# Patient Record
Sex: Female | Born: 1940
Health system: Southern US, Community
[De-identification: ages and names within clinical notes are randomized; demographics above are authoritative.]

## PROBLEM LIST (undated history)

## (undated) DIAGNOSIS — M1990 Unspecified osteoarthritis, unspecified site: Secondary | ICD-10-CM

## (undated) DIAGNOSIS — I10 Essential (primary) hypertension: Secondary | ICD-10-CM

## (undated) DIAGNOSIS — Z87442 Personal history of urinary calculi: Secondary | ICD-10-CM

## (undated) DIAGNOSIS — C50919 Malignant neoplasm of unspecified site of unspecified female breast: Secondary | ICD-10-CM

## (undated) DIAGNOSIS — F419 Anxiety disorder, unspecified: Secondary | ICD-10-CM

## (undated) DIAGNOSIS — C189 Malignant neoplasm of colon, unspecified: Secondary | ICD-10-CM

## (undated) DIAGNOSIS — M810 Age-related osteoporosis without current pathological fracture: Secondary | ICD-10-CM

## (undated) DIAGNOSIS — K219 Gastro-esophageal reflux disease without esophagitis: Secondary | ICD-10-CM

## (undated) DIAGNOSIS — N189 Chronic kidney disease, unspecified: Secondary | ICD-10-CM

## (undated) DIAGNOSIS — T7840XA Allergy, unspecified, initial encounter: Secondary | ICD-10-CM

## (undated) DIAGNOSIS — K648 Other hemorrhoids: Secondary | ICD-10-CM

## (undated) DIAGNOSIS — D649 Anemia, unspecified: Secondary | ICD-10-CM

## (undated) DIAGNOSIS — F329 Major depressive disorder, single episode, unspecified: Secondary | ICD-10-CM

## (undated) DIAGNOSIS — Z9109 Other allergy status, other than to drugs and biological substances: Secondary | ICD-10-CM

## (undated) DIAGNOSIS — F32A Depression, unspecified: Secondary | ICD-10-CM

## (undated) DIAGNOSIS — E669 Obesity, unspecified: Secondary | ICD-10-CM

## (undated) DIAGNOSIS — Z9289 Personal history of other medical treatment: Secondary | ICD-10-CM

## (undated) DIAGNOSIS — R42 Dizziness and giddiness: Secondary | ICD-10-CM

## (undated) DIAGNOSIS — D509 Iron deficiency anemia, unspecified: Secondary | ICD-10-CM

## (undated) DIAGNOSIS — H269 Unspecified cataract: Secondary | ICD-10-CM

## (undated) HISTORY — DX: Obesity, unspecified: E66.9

## (undated) HISTORY — DX: Essential (primary) hypertension: I10

## (undated) HISTORY — DX: Age-related osteoporosis without current pathological fracture: M81.0

## (undated) HISTORY — DX: Major depressive disorder, single episode, unspecified: F32.9

## (undated) HISTORY — PX: LITHOTRIPSY: SUR834

## (undated) HISTORY — PX: ORIF TIBIA & FIBULA FRACTURES: SHX2131

## (undated) HISTORY — DX: Other hemorrhoids: K64.8

## (undated) HISTORY — DX: Anxiety disorder, unspecified: F41.9

## (undated) HISTORY — PX: BREAST SURGERY: SHX581

## (undated) HISTORY — PX: CHOLECYSTECTOMY: SHX55

## (undated) HISTORY — DX: Malignant neoplasm of colon, unspecified: C18.9

## (undated) HISTORY — DX: Iron deficiency anemia, unspecified: D50.9

## (undated) HISTORY — DX: Chronic kidney disease, unspecified: N18.9

## (undated) HISTORY — PX: POLYPECTOMY: SHX149

## (undated) HISTORY — DX: Unspecified cataract: H26.9

## (undated) HISTORY — DX: Depression, unspecified: F32.A

## (undated) HISTORY — PX: TONSILLECTOMY AND ADENOIDECTOMY: SUR1326

## (undated) HISTORY — DX: Unspecified osteoarthritis, unspecified site: M19.90

## (undated) HISTORY — DX: Allergy, unspecified, initial encounter: T78.40XA

## (undated) HISTORY — PX: ORIF ULNAR / RADIAL SHAFT FRACTURE: SUR966

## (undated) HISTORY — DX: Anemia, unspecified: D64.9

## (undated) HISTORY — PX: COLONOSCOPY: SHX174

## (undated) HISTORY — PX: VAGINAL HYSTERECTOMY: SUR661

---

## 1997-11-30 ENCOUNTER — Ambulatory Visit (HOSPITAL_COMMUNITY): Admission: RE | Admit: 1997-11-30 | Discharge: 1997-11-30 | Payer: Self-pay | Admitting: Orthopedic Surgery

## 1998-08-12 ENCOUNTER — Other Ambulatory Visit: Admission: RE | Admit: 1998-08-12 | Discharge: 1998-08-12 | Payer: Self-pay | Admitting: Obstetrics and Gynecology

## 1999-12-08 ENCOUNTER — Other Ambulatory Visit: Admission: RE | Admit: 1999-12-08 | Discharge: 1999-12-08 | Payer: Self-pay | Admitting: Obstetrics and Gynecology

## 1999-12-08 ENCOUNTER — Encounter (INDEPENDENT_AMBULATORY_CARE_PROVIDER_SITE_OTHER): Payer: Self-pay

## 2004-02-17 ENCOUNTER — Ambulatory Visit: Payer: Self-pay | Admitting: Internal Medicine

## 2004-03-04 ENCOUNTER — Ambulatory Visit: Payer: Self-pay | Admitting: Internal Medicine

## 2007-02-25 ENCOUNTER — Ambulatory Visit: Payer: Self-pay | Admitting: Internal Medicine

## 2007-03-11 ENCOUNTER — Encounter: Payer: Self-pay | Admitting: Internal Medicine

## 2007-03-11 ENCOUNTER — Ambulatory Visit: Payer: Self-pay | Admitting: Internal Medicine

## 2007-03-11 LAB — CONVERTED CEMR LAB: BUN: 12 mg/dL (ref 6–23)

## 2007-03-21 ENCOUNTER — Ambulatory Visit: Payer: Self-pay | Admitting: Cardiology

## 2008-02-29 ENCOUNTER — Inpatient Hospital Stay (HOSPITAL_COMMUNITY): Admission: RE | Admit: 2008-02-29 | Discharge: 2008-03-01 | Payer: Self-pay | Admitting: Urology

## 2008-02-29 ENCOUNTER — Other Ambulatory Visit: Payer: Self-pay | Admitting: Urology

## 2008-02-29 ENCOUNTER — Encounter: Payer: Self-pay | Admitting: Emergency Medicine

## 2008-03-01 ENCOUNTER — Emergency Department (HOSPITAL_COMMUNITY): Admission: EM | Admit: 2008-03-01 | Discharge: 2008-03-02 | Payer: Self-pay | Admitting: Emergency Medicine

## 2008-03-09 ENCOUNTER — Ambulatory Visit (HOSPITAL_COMMUNITY): Admission: RE | Admit: 2008-03-09 | Discharge: 2008-03-09 | Payer: Self-pay | Admitting: Urology

## 2008-04-17 HISTORY — PX: OTHER SURGICAL HISTORY: SHX169

## 2008-05-18 ENCOUNTER — Ambulatory Visit (HOSPITAL_COMMUNITY): Admission: RE | Admit: 2008-05-18 | Discharge: 2008-05-20 | Payer: Self-pay | Admitting: Urology

## 2008-06-05 ENCOUNTER — Ambulatory Visit (HOSPITAL_COMMUNITY): Admission: RE | Admit: 2008-06-05 | Discharge: 2008-06-05 | Payer: Self-pay | Admitting: Family Medicine

## 2009-03-30 ENCOUNTER — Encounter: Admission: RE | Admit: 2009-03-30 | Discharge: 2009-03-30 | Payer: Self-pay | Admitting: Family Medicine

## 2010-05-09 ENCOUNTER — Encounter: Payer: Self-pay | Admitting: Family Medicine

## 2010-08-01 LAB — BASIC METABOLIC PANEL
CO2: 31 mEq/L (ref 19–32)
Calcium: 9.2 mg/dL (ref 8.4–10.5)
Creatinine, Ser: 0.76 mg/dL (ref 0.4–1.2)
GFR calc Af Amer: >60 mL/min (ref >60)
GFR calc non Af Amer: >60 mL/min (ref >60)
Sodium: 142 mEq/L (ref 135–145)

## 2010-08-01 LAB — CBC
MCHC: 33.4 g/dL (ref 30.0–36.0)
MCV: 87.9 fL (ref 78.0–100.0)
Platelets: 241 10*3/uL (ref 150–400)
RBC: 4.91 MIL/uL (ref 3.87–5.11)
RDW: 13.1 % (ref 11.5–15.5)

## 2010-08-01 LAB — TYPE AND SCREEN
ABO/RH(D): O POS
Antibody Screen: NEGATIVE

## 2010-08-30 NOTE — Op Note (Signed)
Ann Maxwell, Ann Maxwell               ACCOUNT NO.:  [id number]   MEDICAL RECORD NO.:  [id number]          PATIENT TYPE:  OIB   LOCATION:  1429                         FACILITY:  [facility]   PHYSICIAN:  Bertram Millard. Dahlstedt, M.D.DATE OF BIRTH:  1940/09/16   DATE OF PROCEDURE:  05/18/2008  DATE OF DISCHARGE:                               OPERATIVE REPORT   PREOPERATIVE DIAGNOSIS:  Right renal calculi.   POSTOPERATIVE DIAGNOSIS:  Right renal calculi.   PRINCIPAL PROCEDURE:  Percutaneous nephrolithotomy of right renal  calculi, aggregate diameter greater than 2 cm.   SURGEON:  Dr. Retta Diones.   RADIOLOGISTSherrine Maples T. Fredia Sorrow, M.D.   COMPLICATIONS:  None.   ANESTHESIA:  General endotracheal.   SPECIMENS:  Six stones, to patient's friend.   DRAINS:  A 20-French Council tipped catheter for right percutaneous  nephrostomy tube.   BRIEF HISTORY:  A 70 year old female with prior lithotripsy of the upper  ureteral/renal calculi by Dr. Vonita Moss.  She had incomplete  fragmentation and non-passage of these stones.  Due to the extreme  durability of these stones, and their persistent renal location, it was  recommended that she undergo percutaneous nephrolithotomy for definitive  removal.  She saw me in consultation recently, and was scheduled for  that procedure.  The risks and complications have been discussed with  the patient.  She understands these and desires to proceed.   DESCRIPTION OF PROCEDURE:  The patient received percutaneous nephrostomy  tube placement by Dr. Fredia Sorrow, dictated separately, in the radiology  department.  She was taken to the holding room where the surgical side  was marked and she was identified.  She had already received Cipro  intravenously in the radiology department.  She was taken to the  operating room where a general anesthetic was administered with  endotracheal apparatus.  She was then placed in the prone position, all  extremities padded appropriately.   The right flank was prepped and  draped.  A timeout was then called.  The proper site, position,  procedure, physician, antibiotics and allergies were discussed.  At this  point, Dr. Fredia Sorrow used the previously placed nephrostomy access to  place a 28-French access sheath.  This was done using fluoroscopic  guidance.  Following this, I placed the rigid nephroscope.  I  immediately encountered approximately 4 fairly large renal calculi.  These were extracted.  One or two of the fragments were seen within the  upper ureter.  These were also extracted.  Using the flexible scope, I  could not detect any further stones, and using fluoroscopy there was  seemingly one calcification in the area of the tip of the sheath.  I  felt, after significant exploration with both the rigid and flexible  scope, that this was most likely intraparenchymal.  For approximately 30-  45 minutes, a search for that one stone.  Since the stone could not be  visualized, I then removed the access sheath after a 20-French Council  tip catheter had been placed in the renal pelvis, with approximately 3  mL of contrast placed in the balloon.  Nephrostogram revealed  adequate  positioning of the tip of the catheter.  The catheter was then sutured  to the skin with 2-0 silk.   At this point, the procedure was terminated.  A dry sterile dressing was  placed.  The safety wire was also removed.  The patient was awakened and  then taken to PACU in stable condition.      Bertram Millard. Dahlstedt, M.D.  Electronically Signed     SMD/MEDQ  D:  05/18/2008  T:  05/18/2008  Job:  [id number]

## 2010-08-30 NOTE — Op Note (Signed)
NAMEVONYA, Ann Maxwell               ACCOUNT NO.:  [id number]   MEDICAL RECORD NO.:  [id number]          PATIENT TYPE:  EMS   LOCATION:  MAJO                         FACILITY:  [facility]   PHYSICIAN:  Maretta Bees. Vonita Moss, M.D.DATE OF BIRTH:  12-Aug-1940   DATE OF PROCEDURE:  02/29/2008  DATE OF DISCHARGE:                               OPERATIVE REPORT   PREOPERATIVE DIAGNOSIS:  Right upper ureteral calculus with obstruction  and fever.   POSTOPERATIVE DIAGNOSIS:  Right upper ureteral calculus with obstruction  and fever.   PROCEDURE:  Cystoscopy, right retrograde pyelogram with interpretation  and right double-J catheter placement.   SURGEON:  Dr. Larey Dresser   ANESTHESIA:  General.   INDICATIONS:  This lady presented today with severe right flank pain and  fever and was felt to have an obstructed right upper ureteral calculus  measuring 5 mm in size in addition to nonobstructing right renal  calculi.  In view pyuria and fever, she was cultured and put on Rocephin  and Cipro and brought the operating room.   PROCEDURE:  The patient brought to the operating room, placed lithotomy  position.  External genitalia were prepped and draped in usual fashion.  She was cystoscoped and the bladder was unremarkable except for findings  consistent with a cystocele.  I then passed a sensor guidewire up to the  right renal collecting system and over that inserted an open-ended  ureteral catheter and performed a collective urine for culture.  Then  using the open-ended ureteral catheter cystoscope, I injected contrast  and she had mild pyelocaliectasis.   Having measured the ureteral length at 24 cm, I inserted a 5-French 24  centimeter Polaris double-J catheter and confirmed good full coil in the  renal pelvis with the loops coming out the distal right ureter and in  good position.  The string was removed from the stent before I had  placed it in position.  At this point the bladder was emptied,  the scope  removed and the patient sent to recovery room in good condition having  tolerated procedure well.      Maretta Bees. Vonita Moss, M.D.  Electronically Signed     LJP/MEDQ  D:  02/29/2008  T:  02/29/2008  Job:  [id number]

## 2010-08-30 NOTE — Consult Note (Signed)
Ann Maxwell, Ann Maxwell               ACCOUNT NO.:  [id number]   MEDICAL RECORD NO.:  [id number]          PATIENT TYPE:  EMS   LOCATION:  MAJO                         FACILITY:  [facility]   PHYSICIAN:  Maretta Bees. 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 Denton Lank at the  [facility] emergency room today after she presented this morning with severe  right flank pain with a 5 mm stone obstructing her right renal  collecting system and also nonobstructing right renal calculi.  She has  never had stones before.  There is a family history of stones.  She had  severe pain and it finally came under reasonable control but then she  developed some chills and fever and in the course of that had a urine  culture and was given a gram of Rocephin IV and 400 mg of Cipro IV and I  was asked to see this lady.  She has had no significant history of UTIs  before.   PAST MEDICAL HISTORY:  She has a history of asthma and hypertension.  Previous surgeries have included, cholecystectomy, hysterectomy, T and  A, bilateral salpingectomy, internal fixation left ankle.   ADMISSION MEDICATIONS:  1. Include Hyzaar 50/12.5 once a day.  2. Singulair 10 mg once a day.  3. Hydrochlorothiazide 12.5 mg once a day.  4. Advair Diskus 250/50 once a day.  5. Tylenol and ibuprofen p.r.n.   SOCIAL HISTORY:  She does not smoke or drink alcohol.   REVIEW OF SYSTEMS:  She denies any acute shortness of breath, cough or  chest pain.  She has had no diarrhea or constipation.   PHYSICAL EXAMINATION:  VITAL SIGNS:  On examination on admission her  temperature was 97.2 but it rose to 100.1.  Pulse ranged from 80 to 112  and her blood pressure was 141/84.  GENERAL:  She was alert and oriented and in some distress.  No  respiratory distress.  HEART:  Tones regular.  ABDOMEN:  Slight guarding in  the right flank.  The rest of the abdomen  was soft and nontender.  No peripheral edema.   IMPRESSION:  1. Obstructing right upper ureteral stone.  2. Right renal calculi.  3. Rule out urinary tract infection.   PLAN:  With a fever and obstructed kidney I felt it best that she be  taken to the operating room for cysto and right retrograde pyelogram and  right double-J catheter placement and she was agreeable to this.  I will  keep her at least for an extended recovery.      Maretta Bees. Vonita Moss, M.D.  Electronically Signed     LJP/MEDQ  D:  02/29/2008  T:  02/29/2008  Job:  [id number]

## 2011-01-18 LAB — DIFFERENTIAL
Basophils Absolute: 0
Basophils Relative: 0
Eosinophils Absolute: 0.1
Monocytes Absolute: 0.1
Monocytes Relative: 1 — ABNORMAL LOW
Neutrophils Relative %: 92 — ABNORMAL HIGH

## 2011-01-18 LAB — URINALYSIS, ROUTINE W REFLEX MICROSCOPIC
Bilirubin Urine: NEGATIVE
Bilirubin Urine: NEGATIVE
Glucose, UA: NEGATIVE
Glucose, UA: NEGATIVE
Ketones, ur: 15 — AB
Ketones, ur: NEGATIVE
Protein, ur: 100 — AB
Protein, ur: NEGATIVE
Urobilinogen, UA: 0.2
pH: 5

## 2011-01-18 LAB — CBC
HCT: 35.1 — ABNORMAL LOW
Hemoglobin: 11.7 — ABNORMAL LOW
Hemoglobin: 14.4
MCHC: 33.3
MCHC: 33.4
MCV: 88.1
MCV: 88.7
RBC: 3.96
RBC: 4.91
RDW: 13
WBC: 15.2 — ABNORMAL HIGH

## 2011-01-18 LAB — BASIC METABOLIC PANEL
CO2: 27
Calcium: 9.1
Chloride: 107
Creatinine, Ser: 0.91
GFR calc Af Amer: >60
Glucose, Bld: 100 — ABNORMAL HIGH

## 2011-01-18 LAB — URINE CULTURE: Colony Count: >=100000

## 2011-01-18 LAB — URINE MICROSCOPIC-ADD ON

## 2011-04-18 DIAGNOSIS — C50919 Malignant neoplasm of unspecified site of unspecified female breast: Secondary | ICD-10-CM

## 2011-04-18 HISTORY — DX: Malignant neoplasm of unspecified site of unspecified female breast: C50.919

## 2011-09-22 ENCOUNTER — Encounter: Payer: Self-pay | Admitting: *Deleted

## 2011-09-26 ENCOUNTER — Encounter: Payer: Self-pay | Admitting: *Deleted

## 2011-12-01 ENCOUNTER — Other Ambulatory Visit: Payer: Self-pay | Admitting: Family Medicine

## 2011-12-01 DIAGNOSIS — R921 Mammographic calcification found on diagnostic imaging of breast: Secondary | ICD-10-CM

## 2011-12-08 ENCOUNTER — Other Ambulatory Visit: Payer: Self-pay | Admitting: Family Medicine

## 2011-12-08 ENCOUNTER — Ambulatory Visit
Admission: RE | Admit: 2011-12-08 | Discharge: 2011-12-08 | Disposition: A | Discharge status: Home / Self Care | Payer: Medicare Other | Source: Ambulatory Visit | Attending: Family Medicine | Admitting: Family Medicine

## 2011-12-08 DIAGNOSIS — R921 Mammographic calcification found on diagnostic imaging of breast: Secondary | ICD-10-CM

## 2011-12-11 ENCOUNTER — Ambulatory Visit
Admission: RE | Admit: 2011-12-11 | Discharge: 2011-12-11 | Disposition: A | Discharge status: Home / Self Care | Payer: Medicare Other | Source: Ambulatory Visit | Attending: Family Medicine | Admitting: Family Medicine

## 2011-12-11 ENCOUNTER — Other Ambulatory Visit: Payer: Self-pay | Admitting: Family Medicine

## 2011-12-11 DIAGNOSIS — N6489 Other specified disorders of breast: Secondary | ICD-10-CM

## 2011-12-11 DIAGNOSIS — C50912 Malignant neoplasm of unspecified site of left female breast: Secondary | ICD-10-CM

## 2011-12-11 DIAGNOSIS — R921 Mammographic calcification found on diagnostic imaging of breast: Secondary | ICD-10-CM

## 2011-12-12 ENCOUNTER — Other Ambulatory Visit: Payer: Self-pay | Admitting: *Deleted

## 2011-12-12 ENCOUNTER — Telehealth: Payer: Self-pay | Admitting: *Deleted

## 2011-12-12 DIAGNOSIS — C50512 Malignant neoplasm of lower-outer quadrant of left female breast: Secondary | ICD-10-CM | POA: Insufficient documentation

## 2011-12-12 DIAGNOSIS — C50519 Malignant neoplasm of lower-outer quadrant of unspecified female breast: Secondary | ICD-10-CM

## 2011-12-12 NOTE — Telephone Encounter (Signed)
Confirmed [facility] for 12/20/11 at 0800 .  Instructions and contact information given.  

## 2011-12-15 ENCOUNTER — Ambulatory Visit
Admission: RE | Admit: 2011-12-15 | Discharge: 2011-12-15 | Disposition: A | Discharge status: Home / Self Care | Payer: Medicare Other | Source: Ambulatory Visit | Attending: Family Medicine | Admitting: Family Medicine

## 2011-12-15 DIAGNOSIS — C50912 Malignant neoplasm of unspecified site of left female breast: Secondary | ICD-10-CM

## 2011-12-15 MED ORDER — GADOBENATE DIMEGLUMINE 529 MG/ML IV SOLN
20.0000 mL | Freq: Once | INTRAVENOUS | Status: AC | PRN
  Administered 2011-12-15: 20 mL via INTRAVENOUS

## 2011-12-20 ENCOUNTER — Encounter: Payer: Self-pay | Admitting: Oncology

## 2011-12-20 ENCOUNTER — Ambulatory Visit (HOSPITAL_BASED_OUTPATIENT_CLINIC_OR_DEPARTMENT_OTHER): Payer: Medicare Other | Admitting: General Surgery

## 2011-12-20 ENCOUNTER — Ambulatory Visit: Payer: Medicare Other | Attending: General Surgery | Admitting: Physical Therapy

## 2011-12-20 ENCOUNTER — Encounter: Payer: Self-pay | Admitting: *Deleted

## 2011-12-20 ENCOUNTER — Ambulatory Visit: Payer: Medicare Other

## 2011-12-20 ENCOUNTER — Telehealth: Payer: Self-pay | Admitting: Oncology

## 2011-12-20 ENCOUNTER — Ambulatory Visit (HOSPITAL_BASED_OUTPATIENT_CLINIC_OR_DEPARTMENT_OTHER): Payer: Medicare Other | Admitting: Oncology

## 2011-12-20 ENCOUNTER — Ambulatory Visit
Admission: RE | Admit: 2011-12-20 | Discharge: 2011-12-20 | Disposition: A | Discharge status: Home / Self Care | Payer: Medicare Other | Source: Ambulatory Visit | Attending: Radiation Oncology | Admitting: Radiation Oncology

## 2011-12-20 VITALS — BP 137/80 | HR 106 | Temp 97.6°F | Resp 20 | Ht 63.1 in | Wt 241.4 lb

## 2011-12-20 DIAGNOSIS — C50519 Malignant neoplasm of lower-outer quadrant of unspecified female breast: Secondary | ICD-10-CM

## 2011-12-20 DIAGNOSIS — D0590 Unspecified type of carcinoma in situ of unspecified breast: Secondary | ICD-10-CM

## 2011-12-20 DIAGNOSIS — M25519 Pain in unspecified shoulder: Secondary | ICD-10-CM | POA: Insufficient documentation

## 2011-12-20 DIAGNOSIS — Z8041 Family history of malignant neoplasm of ovary: Secondary | ICD-10-CM

## 2011-12-20 DIAGNOSIS — C50919 Malignant neoplasm of unspecified site of unspecified female breast: Secondary | ICD-10-CM | POA: Insufficient documentation

## 2011-12-20 DIAGNOSIS — IMO0001 Reserved for inherently not codable concepts without codable children: Secondary | ICD-10-CM | POA: Insufficient documentation

## 2011-12-20 DIAGNOSIS — R293 Abnormal posture: Secondary | ICD-10-CM | POA: Insufficient documentation

## 2011-12-20 NOTE — Progress Notes (Signed)
[facility] Psychosocial Distress Screening  Clinical Social Work  Patient completed distress screening protocol, and scored a 9 on the Psychosocial Distress Thermometer which indicates severe distress. Clinical Social Worker met with patient and patient's daughter in [facility] to assess for distress and other psychosocial needs. Pt stated that she was still experiencing some anxiety about treatment, but felt "better" after meeting with physicians and getting information.  CSW informed pt and pt's daughter of the [facility] support team and support services. Pt was agreeable to CSW making a Reach to recovery referral.  CSW encouraged pt to call with any additional questions or concerns.    Tamala Julian, MSW, LCSW  Clinical Social Worker  [facility]  [phone]

## 2011-12-20 NOTE — Telephone Encounter (Signed)
S/w the pt and she is aware of her oct 2013 appt calendar

## 2011-12-20 NOTE — Patient Instructions (Addendum)
Sentinel Lymph Node Analysis in Breast Cancer Treatment WHAT IS A LYMPH NODE? Lymph nodes are little glands that lie along the lymph channels and serve to trap infections in the body. These are the small vessel-like structures that carry the fluid (lymph) from body tissues away to be filtered. These are the "glands" that feel swollen in the neck when you or your child has a sore throat. These glands in your armpit are where breast cancer tends to spread first. WHAT IS SENTINEL LYMPH NODE ANALYSIS? Sentinel lymph node study is a new cancer diagnostic procedure. It aims to look at the most likely first spread of breast cancer. It involves looking at the lymph node or nodes where breast cancer is likely to travel next.  PROCEDURE  A small amount of blue dye and radioactive tracer are injected around the tumor in the breast. The dye and tracer follow the same path that a spreading cancer would be likely to follow. With the use of a Geiger counter, the radioactive tracer can be located in the lymph node that is the gatekeeper to other lymph nodes in the armpit. The sentinel lymph node is the first lymph node in a chain of lymph nodes that drain the lymph from the breast. The blue dye enables the surgeon to identify this sentinel node. This node can be removed and examined. If no cancer is found in this node, no further removal of lymph nodes is recommended. In this case, the spread of cancer to the other lymph nodes is rare. This eliminates any more surgery in the armpit and risk of complications. If the lymph node shows spread of the cancer from the breast, the other lymph nodes in the armpit are removed and analyzed. This helps the doctor and patient decide how much more surgery is needed and if chemotherapy and/or radiation treatment is necessary following the surgery.  BENEFITS  The pathology tests for this procedure are much more sensitive than was previously available.   This technique is thought to be  a major improvement in the treatment of breast cancer.   This procedure allows many patients to avoid the effects of more extensive underarm lymph node removal and risk of complications (infection, bleeding, or severe arm swelling).   Survival rates from breast cancer are better and the risk of complications isreduced.  Document Released: 04/03/2005 Document Revised: 03/23/2011 Document Reviewed: 12/18/2006 [employer] Patient Information 2012 [employer]. 

## 2011-12-20 NOTE — Progress Notes (Signed)
Checked in new pt.  No financial concerns at this time. °

## 2011-12-20 NOTE — Assessment & Plan Note (Signed)
Pt with large area of DCIS not amenable to BCT.  Will plan left simple mastectomy with sentinel lymph node biopsy.  Discussed risks and benefits of surgery. The surgical procedure was described to the patient.  I discussed the incision type and location and that we would need radiology involved on the day of surgery with a sentinel node.      The risks and benefits of the procedure were described to the patient and she wishes to proceed.    We discussed the risks bleeding, infection, damage to other structures, need for further procedures/surgeries.  We discussed the risk of seroma. The patient was advised that these are the most common complications, but that others can occur as well.  They were advised against taking aspirin or other anti-inflammatory agents/blood thinners the week before surgery.    The patient declines plastics referral for reconstruction.  She was advised to go to [facility] for bra fitting pre operatively.    45 minutes total were spent with counseling, exam, and coordination of care.

## 2011-12-20 NOTE — Patient Instructions (Addendum)
Proceed with mastectomy  I will will see you back in 1 month

## 2011-12-20 NOTE — Progress Notes (Signed)
[facility] Radiation Oncology NEW PATIENT EVALUATION  Name: Ann Maxwell MRN: [id number]  Date:   12/20/2011           DOB: 01-13-1941  Status: outpatient   CC: Warrick Parisian, MD  Almond Lint, MD    REFERRING PHYSICIAN: Almond Lint, MD   DIAGNOSIS: Stage 0 (Tis N0 M0) DCIS of the left breast    HISTORY OF PRESENT ILLNESS:  Ann Maxwell is a 71 y.o. female who is seen today at the [facility]. for evaluation of her DCIS of the left breast. At the time of a screening mammogram at [facility] on 06/01/2011 she was felt to have a tight cluster of calcifications within the left breast. A six-month interval followup was recommended. On Aug 31 2011 she underwent repeat mammography and she was then found to have an additional clusters of calcifications worrisome for DCIS. Stereotactic biopsy on 12/08/2011 revealed high-grade DCIS with comedonecrosis and calcification. The DCIS was strongly ER positive at 100% and PR + at 78%. Breast MR on 12/15/2011 showed clump enhancement along the lower outer and lower central left breast over an area of 10 x 5 x 3.5 cm. She is without complaints today. She seen today along with Dr. Donell Beers and Dr. Welton Flakes.  PREVIOUS RADIATION THERAPY: No   PAST MEDICAL HISTORY:  has a past medical history of Asthma; Hypertension; Osteoarthritis; Chest pain; Palpitations; and Obesity.     PAST SURGICAL HISTORY:  Past Surgical History  Procedure Date  . Tonsillectomy and adenoidectomy   . Tvh and bso 1994  . Tibia fracture surgery 1995    and fibular  with int fx  . Cholecystectomy      FAMILY HISTORY: family history includes Cancer in her maternal grandmother, mother, paternal grandfather, and sister; Heart attack in her father; Heart disease in her father; and Hypertension in her father. Her father died of a stroke at age 73. Her mother died from ovarian cancer 19 and her maternal grandmother died of uterine cancer 71. No family history of breast  cancer.   SOCIAL HISTORY:  reports that she has never smoked. She does not have any smokeless tobacco history on file. She reports that she does not drink alcohol or use illicit drugs. Divorced, one daughter. She worked as a Press photographer.   ALLERGIES: Terbinafine and related; Adhesive; and Oxycodone-acetaminophen   MEDICATIONS:  Current Outpatient Prescriptions  Medication Sig Dispense Refill  . aspirin 81 MG tablet Take 81 mg by mouth daily.      Marland Kitchen co-enzyme Q-10 30 MG capsule Take 30 mg by mouth daily.      . Fluticasone-Salmeterol (ADVAIR) 250-50 MCG/DOSE AEPB Inhale 1 puff into the lungs 2 (two) times daily.      . hydrochlorothiazide (MICROZIDE) 12.5 MG capsule Take 12.5 mg by mouth daily.      Marland Kitchen ipratropium-albuterol (DUONEB) 0.5-2.5 (3) MG/3ML SOLN Take 3 mLs by nebulization as needed.      . loratadine (CLARITIN) 10 MG tablet Take 10 mg by mouth as needed.      Marland Kitchen losartan-hydrochlorothiazide (HYZAAR) 50-12.5 MG per tablet Take 1 tablet by mouth daily.      . montelukast (SINGULAIR) 10 MG tablet Take 10 mg by mouth at bedtime.         REVIEW OF SYSTEMS:  Pertinent items are noted in HPI.    PHYSICAL EXAM: Alert and oriented 71 year old white female appearing her stated age. Wt Readings from Last 3 Encounters:  12/20/11 241 lb 6.4 oz (109.498 kg)   Temp Readings from Last 3 Encounters:  12/20/11 97.6 F (36.4 C)    BP Readings from Last 3 Encounters:  12/20/11 137/80   Pulse Readings from Last 3 Encounters:  12/20/11 106   Head and neck examination: Grossly unremarkable. Nodes: Without palpable cervical, supraclavicular, or axillary lymphadenopathy. Chest: Lungs clear. Heart: Regular in rhythm. Back: Without spinal or CVA tenderness. Breasts: There is a punctate biopsy wound along the inferior left breast. No masses are appreciated. Right breast without masses or lesions. Abdomen obese without hepatomegaly. Extremities without edema.  Neurologic exam grossly nonfocal.    LABORATORY DATA:  Lab Results  Component Value Date   WBC 7.0 05/14/2008   HGB 14.4 05/14/2008   HCT 43.2 05/14/2008   MCV 87.9 05/14/2008   PLT 241 05/14/2008   Lab Results  Component Value Date   NA 142 05/15/2008   K 3.9 05/15/2008   CL 100 05/15/2008   CO2 31 05/15/2008   No results found for this basename: ALT, AST, GGT, ALKPHOS, BILITOT      IMPRESSION: Stage 0 (Tis N0 M0) high-grade DCIS of the left breast. The anterior aspect of her calcifications were biopsied, but not the posterior aspect. Based on the MRI kinetics in appearance, she most certainly has disease approaching 10 cm. She is not felt to be a candidate for breast preservation. Based on her high-grade disease and size extensive area of involvement, she should have a sentinel lymph node biopsy at the time of her mastectomy. She is unlikely to need radiation therapy. We also discussed reconstructive options including the placement of a tissue expander at the time of her mastectomy. She is not interested in immediate reconstruction at this time. Plastic surgical consultation was offered. Her prognosis appears to be favorable.   PLAN: As discussed above   I spent 40 minutes minutes face to face with the patient and more than 50% of that time was spent in counseling and/or coordination of care.

## 2011-12-20 NOTE — Progress Notes (Addendum)
Chief complaint:  Left breast DCIS   HISTORY: Pt presents on 6 month follow up for abnormal calcifications.  She had progression of calcifications and underwent biopsy.  Biopsy was positive for DCIS.  Mammogram demonstrated 8 cm area of calcs and MRI demonstrated 10 cm calcifications.  She has not ever palpated a mass.  She denies nipple discharge.  She has no family history of breast cancer.  Her mother had ovarian cancer and maternal grandmother had uterine cancer.  She does have asthma and "shoulder bursitis."    Past Medical History  Diagnosis Date  . Asthma   . Hypertension   . Osteoarthritis   . Chest pain   . Palpitations   . Obesity     Past Surgical History  Procedure Date  . Tonsillectomy and adenoidectomy   . Tvh and bso 1994  . Tibia fracture surgery 1995    and fibular  with int fx  . Cholecystectomy     Current Outpatient Prescriptions  Medication Sig Dispense Refill  . aspirin 81 MG tablet Take 81 mg by mouth daily.      . co-enzyme Q-10 30 MG capsule Take 30 mg by mouth daily.      . Fluticasone-Salmeterol (ADVAIR) 250-50 MCG/DOSE AEPB Inhale 1 puff into the lungs 2 (two) times daily.      . hydrochlorothiazide (MICROZIDE) 12.5 MG capsule Take 12.5 mg by mouth daily.      . ipratropium-albuterol (DUONEB) 0.5-2.5 (3) MG/3ML SOLN Take 3 mLs by nebulization as needed.      . loratadine (CLARITIN) 10 MG tablet Take 10 mg by mouth as needed.      . losartan-hydrochlorothiazide (HYZAAR) 50-12.5 MG per tablet Take 1 tablet by mouth daily.      . montelukast (SINGULAIR) 10 MG tablet Take 10 mg by mouth at bedtime.         Allergies  Allergen Reactions  . Terbinafine And Related   . Adhesive (Tape) Other (See Comments)    Surgical tape causes blisters  . Oxycodone-Acetaminophen Nausea Only     Family History  Problem Relation Age of Onset  . Heart attack Father   . Hypertension Father   . Heart disease Father   . Cancer Mother     ovarian  . Cancer  Maternal Grandmother     uterine  . Cancer Paternal Grandfather     liver  . Cancer Sister     thyroid     History   Social History  . Marital Status: Divorced    Spouse Name: N/A    Number of Children: N/A  . Years of Education: N/A   Occupational History  . retired nurse    Social History Main Topics  . Smoking status: Never Smoker   . Smokeless tobacco: Not on file  . Alcohol Use: No  . Drug Use: No  . Sexually Active: Not on file   Other Topics Concern  . 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    EXAM: Wt Readings from Last 3 Encounters:  12/20/11 241 lb 6.4 oz (109.498 kg)   Temp Readings from Last 3 Encounters:  12/20/11 97.6 F (36.4 C)    BP Readings from Last 3 Encounters:  12/20/11   137/80   Pulse Readings from Last 3 Encounters:  12/20/11 106    Gen:  No acute distress.  Well nourished and well groomed.   Neurological: Alert and oriented to person, place, and time. Coordination normal.  Head: Normocephalic and atraumatic.  Eyes: Conjunctivae are normal. Pupils are equal, round, and reactive to light. No scleral icterus.  Neck: Normal range of motion. Neck supple. No tracheal deviation or thyromegaly present.  Cardiovascular: Normal rate, regular rhythm, normal heart sounds and intact distal pulses.  Exam reveals no gallop and no friction rub.  No murmur heard. Breast:  No palpable masses, nipple retraction, or nipple discharge in either breast.  Breasts are ptotic bilaterally.  Right breast is slightly larger.  No skin edema or change in skin contour.  No lymphadenopathy.  Some bruising at left sided biopsy site.  Both breasts slightly denser than average.   Respiratory: Effort normal.  No respiratory distress.  No chest wall tenderness. Breath sounds normal.  No wheezes, rales or rhonchi.  GI: Soft. Bowel sounds are normal. The abdomen is soft and nontender.  There is no rebound and no guarding. Right subcostal incision.   Musculoskeletal: Normal range of motion. Extremities are nontender.  Lymphadenopathy: No cervical, preauricular, postauricular or axillary adenopathy is present Skin: Skin is warm and dry. No rash noted. No diaphoresis. No erythema. No pallor. No clubbing, cyanosis, or edema.   Psychiatric: Normal mood and affect. Behavior is normal. Judgment and thought content normal.    LABORATORY RESULTS: Available labs are reviewed  High grade DCIS with comedonecrosis and calcification.  ER +/PR +   RADIOLOGY RESULTS: See E-Chart or I-Site for most recent results.  Images and reports are reviewed.  Mammogram STEREOTACTIC-GUIDED VACUUM ASSISTED BIOPSY OF THE LEFT BREAST AND  SPECIMEN RADIOGRAPH  I met with the patient, and we discussed the procedure of  stereotactic-guided biopsy, including risks. Specifically, we  discussed the risks of bleeding, infection and clip migration. We  discussed the high likelihood of a successful procedure. Informed,  written consent was given.  Using sterile technique, local anesthesia, stereotactic guidance,  and a 9 gauge vacuum assisted device, biopsy was performed of the  recently demonstrated 8 x 4 x 3 cm area of calcifications in the  lower outer quadrant of the left breast seen at [facility]. An inferior approach was used. A specimen radiograph was  performed, showing multiple calcifications within multiple  specimens. Specimens with calcifications were identified for  pathology.  At the conclusion of the procedure, a tissue marker clip was  deployed into the biopsy cavity. A follow-up 2-view mammogram  demonstrates a cylinder shaped biopsy marker clip at the location  of the biopsied calcifications.  IMPRESSION:    Stereotactic-guided biopsy of an 8 x 4 x 3 cm area of  calcifications in the lower outer quadrant of the left breast. No  apparent complications.   Breast MR IMPRESSION:  1. Large segmental area of clumped enhancement in the lower outer  quadrant of the left breast spans approximately 10 x 5 x 3.5 cm on  MRI. The biopsy clip is within the anterior half of the abnormal  enhancement. If an additional biopsy is desired to confirm extent  of disease, a stereotactic biopsy of the more posterior  calcifications in the outer left breast could be considered.  2. Marked, complex parenchymal enhancement pattern in both  breasts. No specific MRI evidence of malignancy in the right  breast, above the background enhancement. Given   the complex  parenchymal pattern, and the patient's left breast DCIS, follow-up  breast MRI in 6-12 months is should be considered.  RECOMMENDATION:  Treatment plan for patient's known high-grade DCIS of the left  breast. Consider breast MRI in 6-12 months for evaluation of  complex parenchymal enhancement pattern in the right breast, which  may limit the sensitivity for detecting malignancy.    ASSESSMENT AND PLAN: left breast DCIS 10 cm Pt with large area of DCIS not amenable to BCT.  Will plan left simple mastectomy with sentinel lymph node biopsy.  Discussed risks and benefits of surgery. The surgical procedure was described to the patient.  I discussed the incision type and location and that we would need radiology involved on the day of surgery with a sentinel node.      The risks and benefits of the procedure were described to the patient and she wishes to proceed.    We discussed the risks bleeding, infection, damage to other structures, need for further procedures/surgeries.  We discussed the risk of seroma. The patient was advised that these are the most common complications, but that others can occur as well.  They were advised against taking aspirin or  other anti-inflammatory agents/blood thinners the week before surgery.    The patient declines plastics referral for reconstruction.  She was advised to go to [facility] for bra fitting pre operatively.    45 minutes total were spent with counseling, exam, and coordination of care.       Bisma Klett L Anaisa Radi MD Surgical Oncology, General and Endocrine Surgery [facility]      Visit Diagnoses: 1. left breast DCIS 10 cm     Primary Care Physician: STALLINGS,SHEILA, MD   Khan, Kalsoom MD  Murray, Robert MD 

## 2011-12-20 NOTE — Progress Notes (Signed)
Mailed after appt letter to pt. 

## 2011-12-20 NOTE — Progress Notes (Signed)
Ann Maxwell [id number] 20-Dec-1940 71 y.o. 12/20/2011 11:51 AM  CC  Warrick Parisian, MD [address] [place] [address] Dr. Almond Lint Dr. Chipper Herb  REASON FOR CONSULTATION:  71 year old female with ductal carcinoma in situ of the left breast. Patient was seen in the Multidisciplinary Breast Clinic for discussion of her treatment options.   STAGE:   left breast DCIS 10 cm   Primary site: Breast (Left)   Staging method: AJCC 7th Edition   Clinical: Stage 0 (Tis, N0, cM0)   Summary: Stage 0 (Tis, N0, cM0)  REFERRING PHYSICIAN: Dr. Almond Lint  HISTORY OF PRESENT ILLNESS:  Ann Maxwell is a 71 y.o. female.  With multiple medical problems including asthma hypertension neck and shoulder pains. Patient was recently seen for a six-month followup mammogram on 12/01/2011. She was noted to have mildly suspicious left breast calcifications which were increasing in comparison to 6 months ago. Because of this patient went on to have needle core biopsy performed on 12/08/2011 the biopsy revealed a high-grade ductal carcinoma in situ with comedonecrosis and calcifications. The tumor was estrogen receptor +100% progesterone receptor +78%. On 12/15/2011 patient had MRI of the breasts performed. In the left breast there was noted to be segmental area of clumped enhancement in the lower outer and lower central left breast spanning 10 x 5 x 3.5 cm. Enhancement extended from just posterior to the nipple to the posterior third of the breast. It was no enhancement immediately adjacent to or within the pectoralis muscle. Patient's case was discussed at the multidisciplinary breast conference today. Recommendations were made based on NCCN guidelines for stage 0 breast cancer.She is without any complaints. Patient has not had prior biopsies of the breasts.   Past Medical History: Past Medical History  Diagnosis Date  . Asthma   . Hypertension   . Osteoarthritis   . Chest pain     . Palpitations   . Obesity     Past Surgical History: Past Surgical History  Procedure Date  . Tonsillectomy and adenoidectomy   . Tvh and bso 1994  . Tibia fracture surgery 1995    and fibular  with int fx  . Cholecystectomy     Family History: Family History  Problem Relation Age of Onset  . Heart attack Father   . Hypertension Father   . Heart disease Father   . Cancer Mother     ovarian  . Cancer Maternal Grandmother     uterine  . Cancer Paternal Grandfather     liver  . Cancer Sister     thyroid    Social History History  Substance Use Topics  . Smoking status: Never Smoker   . Smokeless tobacco: Not on file  . Alcohol Use: No    Allergies: Allergies  Allergen Reactions  . Terbinafine And Related   . Adhesive (Tape) Other (See Comments)    Surgical tape causes blisters  . Oxycodone-Acetaminophen Nausea Only    Current Medications: Current Outpatient Prescriptions  Medication Sig Dispense Refill  . aspirin 81 MG tablet Take 81 mg by mouth daily.      Marland Kitchen co-enzyme Q-10 30 MG capsule Take 30 mg by mouth daily.      . Fluticasone-Salmeterol (ADVAIR) 250-50 MCG/DOSE AEPB Inhale 1 puff into the lungs 2 (two) times daily.      . hydrochlorothiazide (MICROZIDE) 12.5 MG capsule Take 12.5 mg by mouth daily.      Marland Kitchen ipratropium-albuterol (DUONEB) 0.5-2.5 (3) MG/3ML SOLN  Take 3 mLs by nebulization as needed.      . loratadine (CLARITIN) 10 MG tablet Take 10 mg by mouth as needed.      Marland Kitchen losartan-hydrochlorothiazide (HYZAAR) 50-12.5 MG per tablet Take 1 tablet by mouth daily.      . montelukast (SINGULAIR) 10 MG tablet Take 10 mg by mouth at bedtime.        OB/GYN History:Menarche at age 25 patient underwent menopause in 1995 she had been on hormone replacement therapy for about 8 years she stopped in 2003. She is G1 P1 first live birth was at age 65.  Fertility Discussion: Patient has completed her family Prior History of Cancer: No prior history of  malignancy  Health Maintenance:  Colonoscopy 2010 Bone Density 1995 Last PAP smear 2005  ECOG PERFORMANCE STATUS: 1 - Symptomatic but completely ambulatory  Genetic Counseling/testing: Patient's history is reviewed her mother had ovarian cancer at the age of 5 maternal grandmother possibly with ovarian or uterine cancer at 66 on father's side paternal grandfather had possibly liver cancer in his 6s a paternal aunt with thyroid cancer at 73. Based on his family history patient is recommended genetic counseling and possibly testing and she will be referred to our genetic counselor  REVIEW OF SYSTEMS: Patient does have hot flashes and night sweats she does complain of some insomnia fatigue and left knee pain due to her osteoarthritis. She denies any difficulty with swallowing no hoarseness mouth sores no sinus problems she has no chest pains palpitations no swelling in her lower extremities no prior history of strokes she denies shortness of breath no cough or hemoptysis. Patient denies any abdominal pain no changes in her bowel or bladder habits no nausea or vomiting her appetite is stable. She occasionally does get stress incontinence otherwise no urinary tract infections or kidney infections or hematuria. She has not noticed any masses no nipple discharge no breast dimpling no pain or infections she has no rashes no abnormal bones no easy bruising. Patient does complain of back pain and joint pain. She has no anxiety depression or phobia as she does again experience hot flashes. She has no bleeding problems. Remainder of the 14 point review of systems is negative  PHYSICAL EXAMINATION: Blood pressure 137/80, pulse 106, temperature 97.6 F (36.4 C), resp. rate 20, height 5' 3.1" (1.603 m), weight 241 lb 6.4 oz (109.498 kg).  Gen.: Patient is awake alert in no acute distress mildly obese female HEENT exam: EOMI PERRLA sclerae anicteric no conjunctival pallor oral mucosa is moist neck is supple no  palpable cervical supraclavicular or axillary adenopathy Lungs: Clear to auscultation percussion Cardiovascular: Regular rate rhythm no murmurs Abdomen: Soft nontender nondistended bowel sounds are present no HSM right upper quadrant incisional scar secondary to cholecystectomy as noted Extremities: No edema clubbing or cyanosis Skin: No rashes Neuro patient's alert oriented x3 motor and sensory is intact. Breast examination right breast no masses nipple discharge skin changes no nipple retraction or inversion no dimpling of the skin. Left breast: No palpable masses or nipple discharge no area of ecchymosis no other skin changes.  STUDIES/RESULTS: Mr Breast Bilateral W Wo Contrast  12/15/2011  *RADIOLOGY REPORT*  Clinical Data: Recent diagnosis of left breast high-grade ductal carcinoma in situ, following stereotactic biopsy.  Suspicious calcifications in the left breast span approximately 8 x 4 x 3 cm, as detailed on recent mammogram.  BUN and creatinine were obtained on site at [facility] at [address] Results:  BUN  17 mg/dL,  Creatinine 0.9 mg/dL.  Comparison:    Bilateral mammogram 05/30/2011 from [facility], diagnostic left mammogram and post-biopsy clip mammogram dated 12/01/2011 and 12/08/2011 respectively, and diagnostic right mammogram 12/11/2011 [facility].  Findings: The patient has a marked, complex nodular parenchymal enhancement pattern bilaterally.  This can decrease the sensitivity for detecting malignancy.  Left breast:  Above the background level of enhancement, there is a segmental area of clumped enhancement in the lower outer and lower central left breast with predominately plateau kinetics, that spans approximately 10 x 5 x 3.5 cm (AP by transverse by craniocaudal). Enhancement extends from just posterior to the nipple to the posterior third of the breast.  There is no enhancement immediately adjacent to or within the pectoralis muscle.  The  biopsy clip from recent stereotactic biopsy is within the cephalad and anterior to middle thirds of the area of clumped enhancement.  On T2-weighted images, there is increased T2 signal within the corresponding to the area of clumped linear enhancement in the inferior left breast.  Right breast::  There is a complex parenchymal enhancement pattern. No dominant area of enhancement is identified within the right breast.  Negative for axillary or internal mammary chain lymphadenopathy.  No suspicious findings in the visualized portion of the liver.  IMPRESSION:  1.  Large segmental area of clumped enhancement in the lower outer quadrant of the left breast spans approximately 10 x 5 x 3.5 cm on MRI.  The biopsy clip is within the anterior half of the abnormal enhancement.  If an additional biopsy is desired to confirm extent of disease, a stereotactic biopsy of the more posterior calcifications in the outer left breast could be considered. 2.  Marked, complex parenchymal enhancement pattern in both breasts.  No specific MRI evidence of malignancy in the right breast, above the background enhancement.  Given the complex parenchymal pattern, and the patient's left breast DCIS, follow-up breast MRI in 6-12 months is should be considered.  RECOMMENDATION:  Treatment plan for patient's known high-grade DCIS of the left breast.  Consider breast MRI in 6-12 months for evaluation of complex parenchymal enhancement pattern in the right breast, which may limit the sensitivity for detecting malignancy.  THREE-DIMENSIONAL MR IMAGE RENDERING ON INDEPENDENT WORKSTATION:  Three-dimensional MR images were rendered by post-processing of the original MR data on an independent workstation.  The three- dimensional MR images were interpreted, and findings were reported in the accompanying complete MRI report for this study.  BI-RADS CATEGORY 6:  Known biopsy-proven malignancy - appropriate action should be taken.   Original Report  Authenticated By: Britta Mccreedy, M.D.    Mm Breast Stereo Biopsy Left  12/08/2011  *RADIOLOGY REPORT*  Clinical Data:  8 x 4 x 3 cm area of calcifications suspicious for DCIS in the lower outer quadrant of the left breast at recent mammography.  STEREOTACTIC-GUIDED VACUUM ASSISTED BIOPSY OF THE LEFT BREAST AND SPECIMEN RADIOGRAPH  I met with the patient, and we discussed the procedure of stereotactic-guided biopsy, including risks.  Specifically, we discussed the risks of bleeding, infection and clip migration.  We discussed the high likelihood of a successful procedure.  Informed, written consent was given.  Using sterile technique, local anesthesia, stereotactic guidance, and a 9 gauge vacuum assisted device, biopsy was performed of the recently demonstrated 8 x 4 x 3 cm area of calcifications in the lower outer quadrant of the left breast seen at [facility].  An inferior approach was used.  A specimen radiograph was performed, showing multiple calcifications within multiple specimens.  Specimens with calcifications were identified for pathology.  At the conclusion of the procedure, a tissue marker clip was deployed into the biopsy cavity.  A follow-up 2-view mammogram demonstrates a cylinder shaped biopsy marker clip at the location of the biopsied calcifications.  IMPRESSION: Stereotactic-guided biopsy of an 8 x 4 x 3 cm area of calcifications in the lower outer quadrant of the left breast.  No apparent complications.   Original Report Authenticated By: Darrol Angel, M.D.    Mm Breast Surgical Specimen  12/08/2011  *RADIOLOGY REPORT*  Clinical Data:  8 x 4 x 3 cm area of calcifications suspicious for DCIS in the lower outer quadrant of the left breast at recent mammography.  STEREOTACTIC-GUIDED VACUUM ASSISTED BIOPSY OF THE LEFT BREAST AND SPECIMEN RADIOGRAPH  I met with the patient, and we discussed the procedure of stereotactic-guided biopsy, including risks.  Specifically, we discussed the  risks of bleeding, infection and clip migration.  We discussed the high likelihood of a successful procedure.  Informed, written consent was given.  Using sterile technique, local anesthesia, stereotactic guidance, and a 9 gauge vacuum assisted device, biopsy was performed of the recently demonstrated 8 x 4 x 3 cm area of calcifications in the lower outer quadrant of the left breast seen at [facility].  An inferior approach was used.  A specimen radiograph was performed, showing multiple calcifications within multiple specimens.  Specimens with calcifications were identified for pathology.  At the conclusion of the procedure, a tissue marker clip was deployed into the biopsy cavity.  A follow-up 2-view mammogram demonstrates a cylinder shaped biopsy marker clip at the location of the biopsied calcifications.  IMPRESSION: Stereotactic-guided biopsy of an 8 x 4 x 3 cm area of calcifications in the lower outer quadrant of the left breast.  No apparent complications.   Original Report Authenticated By: Darrol Angel, M.D.    Mm Digital Diagnostic Unilat R  12/11/2011  *RADIOLOGY REPORT*  Clinical Data:  Recently diagnosed left breast DCIS.  Right breast mammogram prior to breast MRI.  DIGITAL DIAGNOSTIC RIGHT BREAST MAMMOGRAM WITH CAD  Comparison:  05/30/2011, 04/05/2010, 12/09/2008 mammograms from [facility] imaging.  Findings:  There is an extremely dense breast parenchymal pattern. There are scattered punctate calcifications seen within the right breast which appear stable when compared to prior studies.  There is no mass, distortion, or worrisome calcification within the right breast. Mammographic images were processed with CAD.  IMPRESSION: No findings worrisome for developing malignancy within the right breast.  Breast MRI is planned (recently diagnosed left breast DCIS).  RECOMMENDATION: Breast MRI as discussed above.  BI-RADS CATEGORY 1:  Negative.   Original Report Authenticated By: Rolla Plate,  M.D.    Mm Radiologist Eval And Mgmt  12/11/2011  ESTABLISHED PATIENT OFFICE VISIT - LEVEL II [phone])  Chief Complaint:  The patient returns for results following left breast stereotactic core biopsy.  History:  The patient was initially evaluated at [facility] imaging and found to have  suspicious left breast microcalcifications.  She was self referred to the [facility] for stereotactic core biopsy which was performedby Dr. Azucena Kuba on 12/08/2011.  Exam:  The patients biopsy site within the left breast is clean and dry without hematoma formation.  There is minimal tenderness. There are no signs of infection.  Pathology: The pathology demonstrated high-grade DCIS. The pathology is concordant with imaging findings.  Assessment and Plan:  The findings  were discussed with the patient and her questions were answered.  She is accompanied by her friend. The patient has been scheduled for the breast cancer multidisciplinary clinic on 12/20/2011.  Breast MRI is planned for 12/15/2011.  Post biopsy wound care instructions were reviewed with the patient.  The patient was encouraged to call the Breast Center for additional questions or concerns.  Educational materials were given to the patient.   Original Report Authenticated By: Rolla Plate, M.D.      LABS:    Chemistry      Component Value Date/Time   NA 142 05/15/2008 0900   K 3.9 05/15/2008 0900   CL 100 05/15/2008 0900   CO2 31 05/15/2008 0900   BUN 21 05/15/2008 0900   CREATININE 0.76 05/15/2008 0900      Component Value Date/Time   CALCIUM 9.2 05/15/2008 0900      Lab Results  Component Value Date   WBC 7.0 05/14/2008   HGB 14.4 05/14/2008   HCT 43.2 05/14/2008   MCV 87.9 05/14/2008   PLT 241 05/14/2008       PATHOLOGY: ADDITIONAL INFORMATION: PROGNOSTIC INDICATORS - ACIS Results IMMUNOHISTOCHEMICAL AND MORPHOMETRIC ANALYSIS BY THE AUTOMATED CELLULAR IMAGING SYSTEM (ACIS) Estrogen Receptor (Negative, <1%): 100%, STRONG STAINING  INTENSITY Progesterone Receptor (Negative, <1%): 78%, STRONG STAINING INTENSITY All controls stained appropriately Pecola Leisure MD Pathologist, Electronic Signature ( Signed 12/13/2011) FINAL DIAGNOSIS Diagnosis Breast, left, needle core biopsy, LOQ - HIGH GRADE DUCTAL CARCINOMA IN SITU WITH COMEDONECROSIS AND CALCIFICATION. - SEE COMMENT. Microscopic Comment A quantitative estrogen receptor and progesterone receptor profile by immunohistochemistry will be performed and reported in an addendum. The findings are called to [facility] on 12/11/11. Dr. Luisa Hart has seen this case in consultation with agreement. (RAH:gt, 12/11/11) Zandra Abts MD Pathologist, Electronic Signature (Case signed 12/11/2011) Specimen Gross and Clinical Information  ASSESSMENT    71 year old female with  #1 recent diagnosis of high-grade ductal carcinoma in situ with comedonecrosis and calcifications of the left breast. Patient was diagnosed after being seen at a 6 month interval mammogram that showed progression of calcifications. Needle core biopsy showed a high-grade DCIS. The tumor was ER positive PR positive. Patient went on to have MRI of the breasts performed that revealed a total area 10 cm of involvement.Patient and I met in the multidisciplinary breast clinic today. She was also seen by Dr. Donell Beers as well as Dr. Chipper Herb. Her case was also discussed at the  multidisciplinary breast conference this morning all of her pathology and radiology were reviewed extensively. Due to the extent of disease almost 10 cm by MRI it was initially recommended that patient have a biopsy of the posterior edge of the area to determine if this was indeed a malignancy as well. We discussed this with the patient however she has opted for a mastectomy.  #2 patient also has a significant family history of ovarian cancers.Because of the family history patient is recommended genetic counseling and possibly  testing and a referral will be made.    PLAN:    #1 patient will proceed with mastectomy  Of The left breast of the left breast with sentinel lymph node mapping initially.  #2 post mastectomy I will plan on seeing the patient back to discuss adjuvant antiestrogen therapy for chemoprophylaxis of the contralateral breast.  #3 patient will also be referred to genetic counseling and testing for BRCA1 and 2 gene mutations do to her family history.  Thank you so much for allowing me to participate in the care of Ann Maxwell. I will continue to follow up the patient with you and assist in her care.  All questions were answered. The patient knows to call the clinic with any problems, questions or concerns. We can certainly see the patient much sooner if necessary.  I spent 55 minutes counseling the patient face to face. The total time spent in the appointment was 60 minutes.  Drue Second, MD Medical/Oncology [facility] [phone] (beeper) [phone] (Office)  12/20/2011, 11:51 AM

## 2011-12-21 ENCOUNTER — Encounter (HOSPITAL_COMMUNITY): Payer: Self-pay | Admitting: Pharmacy Technician

## 2011-12-25 ENCOUNTER — Telehealth: Payer: Self-pay | Admitting: *Deleted

## 2011-12-25 NOTE — Telephone Encounter (Signed)
Spoke to pt concerning [facility] from 9/4.  Pt denies questions or concerns regarding dx or treatment care plan.  Pt request to r/s genetic appt to allow daughter to attend.  R/s appt to 10/31 at 10:00.  Gave pt new date and time.  Confirmed future appts.  Pt denies further needs or concerns at this time.  Encourage pt to call.  Received verbal understanding.  Contact information given.

## 2011-12-25 NOTE — Telephone Encounter (Signed)
LMOVM to let pt know to come in at 11:45 for lab appt and requested if she had any questions, to please call Dawn or myself.

## 2011-12-28 NOTE — Pre-Procedure Instructions (Signed)
20 Ann Maxwell  12/28/2011   Your procedure is scheduled on:  Thursday Septmember 19, 2013.  Report to [facility] at 11:15 AM.  Call this number if you have problems the morning of surgery: [phone]   Remember:   Do not eat food or drink:After Midnight.    Take these medicines the morning of surgery with A SIP OF WATER: Advair inhaler, Albuterol nebulizer if needed for wheezing   Discontinue aspirin, Coumadin, Plavix, Effient, and herbal medications    Do not wear jewelry, make-up or nail polish.  Do not wear lotions, powders, or perfumes.   Do not shave 48 hours prior to surgery.   Do not bring valuables to the hospital.  Contacts, dentures or bridgework may not be worn into surgery.  Leave suitcase in the car. After surgery it may be brought to your room.  For patients admitted to the hospital, checkout time is 11:00 AM the day of discharge.   Patients discharged the day of surgery will not be allowed to drive home.  Name and phone number of your driver:   Special Instructions: CHG Shower Use Special Wash: 1/2 bottle night before surgery and 1/2 bottle morning of surgery.   Please read over the following fact sheets that you were given: Pain Booklet, Coughing and Deep Breathing, MRSA Information and Surgical Site Infection Prevention

## 2011-12-29 ENCOUNTER — Encounter (HOSPITAL_COMMUNITY)
Admission: RE | Admit: 2011-12-29 | Discharge: 2011-12-29 | Disposition: A | Discharge status: Home / Self Care | Payer: Medicare Other | Source: Ambulatory Visit | Attending: General Surgery | Admitting: General Surgery

## 2011-12-29 ENCOUNTER — Encounter (HOSPITAL_COMMUNITY): Payer: Self-pay

## 2011-12-29 HISTORY — DX: Other allergy status, other than to drugs and biological substances: Z91.09

## 2011-12-29 HISTORY — DX: Gastro-esophageal reflux disease without esophagitis: K21.9

## 2011-12-29 HISTORY — DX: Dizziness and giddiness: R42

## 2011-12-29 LAB — BASIC METABOLIC PANEL
BUN: 19 mg/dL (ref 6–23)
Chloride: 99 mEq/L (ref 96–112)
Creatinine, Ser: 0.69 mg/dL (ref 0.50–1.10)
GFR calc Af Amer: >90 mL/min (ref >90)

## 2011-12-29 LAB — CBC
HCT: 35.2 % — ABNORMAL LOW (ref 36.0–46.0)
MCHC: 30.1 g/dL (ref 30.0–36.0)
MCV: 70.5 fL — ABNORMAL LOW (ref 78.0–100.0)
RDW: 17.7 % — ABNORMAL HIGH (ref 11.5–15.5)

## 2011-12-29 NOTE — Progress Notes (Signed)
Patient informed Nurse that she had a stress test a few years ago at [facility] with Dr. Cassell Clement, but has not seen him since because stress test was OK. Patient denied having a cardiac cath, or sleep study. PCP is Dr. Warrick Parisian at [facility]. Will request records from both.

## 2012-01-01 NOTE — Consult Note (Signed)
Anesthesia Chart Review:  Patient is a 71 year old female posted for left mastectomy with SN biopsy by Dr. Donell Beers on 01/05/12.  History includes breast cancer, obesity, GERD, asthma, HTN, non-smoker.  She had a normal stress test at [facility] on 06/16/08 for evaluation of chest pain and palpitations.  PCP is Dr. Warrick Parisian at [facility].    EKG on 12/29/11 showed SR with first degree AVB.  She had a normal nuclear stress test, EF 77% on 06/16/08.  Echo on 06/16/08 showed mild LVH, normal LV size and function, EF 60-65%, doppler findings consistent with impaired LV relaxation, trivial MR/TR, normal PA pressure.  CXR on 12/29/11 showed no worrisome focal or acute cardiopulmonary abnormality seen.   Labs noted.  Anticipate she can proceed as planned.  Shonna Chock, PA-C

## 2012-01-03 MED ORDER — CEFAZOLIN SODIUM-DEXTROSE 2-3 GM-% IV SOLR
2.0000 g | INTRAVENOUS | Status: AC
  Administered 2012-01-04: 2 g via INTRAVENOUS
  Filled 2012-01-03: qty 50

## 2012-01-04 ENCOUNTER — Encounter (HOSPITAL_COMMUNITY): Payer: Self-pay | Admitting: Vascular Surgery

## 2012-01-04 ENCOUNTER — Ambulatory Visit (HOSPITAL_COMMUNITY)
Admission: RE | Admit: 2012-01-04 | Discharge: 2012-01-05 | Disposition: A | Discharge status: Home / Self Care | Payer: Medicare Other | Source: Ambulatory Visit | Attending: General Surgery | Admitting: General Surgery

## 2012-01-04 ENCOUNTER — Ambulatory Visit (HOSPITAL_COMMUNITY): Payer: Medicare Other | Admitting: Vascular Surgery

## 2012-01-04 ENCOUNTER — Encounter (HOSPITAL_COMMUNITY)
Admission: RE | Disposition: A | Discharge status: Home / Self Care | Payer: Self-pay | Source: Ambulatory Visit | Attending: General Surgery

## 2012-01-04 ENCOUNTER — Encounter (HOSPITAL_COMMUNITY): Payer: Self-pay | Admitting: *Deleted

## 2012-01-04 ENCOUNTER — Encounter (HOSPITAL_COMMUNITY): Payer: Self-pay | Admitting: General Surgery

## 2012-01-04 ENCOUNTER — Ambulatory Visit (HOSPITAL_COMMUNITY)
Admission: RE | Admit: 2012-01-04 | Discharge: 2012-01-04 | Disposition: A | Discharge status: Home / Self Care | Payer: Medicare Other | Source: Ambulatory Visit | Attending: General Surgery | Admitting: General Surgery

## 2012-01-04 DIAGNOSIS — K219 Gastro-esophageal reflux disease without esophagitis: Secondary | ICD-10-CM | POA: Insufficient documentation

## 2012-01-04 DIAGNOSIS — Z01812 Encounter for preprocedural laboratory examination: Secondary | ICD-10-CM | POA: Insufficient documentation

## 2012-01-04 DIAGNOSIS — M1990 Unspecified osteoarthritis, unspecified site: Secondary | ICD-10-CM | POA: Insufficient documentation

## 2012-01-04 DIAGNOSIS — Z79899 Other long term (current) drug therapy: Secondary | ICD-10-CM | POA: Insufficient documentation

## 2012-01-04 DIAGNOSIS — C50519 Malignant neoplasm of lower-outer quadrant of unspecified female breast: Secondary | ICD-10-CM

## 2012-01-04 DIAGNOSIS — J45909 Unspecified asthma, uncomplicated: Secondary | ICD-10-CM | POA: Insufficient documentation

## 2012-01-04 DIAGNOSIS — Z7982 Long term (current) use of aspirin: Secondary | ICD-10-CM | POA: Insufficient documentation

## 2012-01-04 DIAGNOSIS — I10 Essential (primary) hypertension: Secondary | ICD-10-CM | POA: Insufficient documentation

## 2012-01-04 DIAGNOSIS — D0590 Unspecified type of carcinoma in situ of unspecified breast: Secondary | ICD-10-CM | POA: Insufficient documentation

## 2012-01-04 DIAGNOSIS — Z01818 Encounter for other preprocedural examination: Secondary | ICD-10-CM | POA: Insufficient documentation

## 2012-01-04 DIAGNOSIS — Z01810 Encounter for preprocedural cardiovascular examination: Secondary | ICD-10-CM | POA: Insufficient documentation

## 2012-01-04 HISTORY — PX: MASTECTOMY W/ SENTINEL NODE BIOPSY: SHX2001

## 2012-01-04 LAB — CREATININE, SERUM
GFR calc Af Amer: >90 mL/min (ref >90)
GFR calc non Af Amer: 86 mL/min — ABNORMAL LOW (ref >90)

## 2012-01-04 LAB — CBC
MCHC: 29.4 g/dL — ABNORMAL LOW (ref 30.0–36.0)
Platelets: 309 10*3/uL (ref 150–400)
RDW: 17.7 % — ABNORMAL HIGH (ref 11.5–15.5)
WBC: 18.1 10*3/uL — ABNORMAL HIGH (ref 4.0–10.5)

## 2012-01-04 SURGERY — MASTECTOMY WITH SENTINEL LYMPH NODE BIOPSY
Anesthesia: General | Site: Breast | Laterality: Left | Wound class: Clean

## 2012-01-04 MED ORDER — LOSARTAN POTASSIUM-HCTZ 50-12.5 MG PO TABS: 1.0000 | ORAL_TABLET | Freq: Every day | ORAL | Status: DC

## 2012-01-04 MED ORDER — LACTATED RINGERS IV SOLN
INTRAVENOUS | Status: DC | PRN
  Administered 2012-01-04: 13:00:00 via INTRAVENOUS

## 2012-01-04 MED ORDER — HYDROMORPHONE HCL PF 1 MG/ML IJ SOLN
0.2500 mg | INTRAMUSCULAR | Status: DC | PRN
  Administered 2012-01-04 (×3): 0.5 mg via INTRAVENOUS

## 2012-01-04 MED ORDER — MIDAZOLAM HCL 2 MG/2ML IJ SOLN
INTRAMUSCULAR | Status: AC
  Filled 2012-01-04: qty 2

## 2012-01-04 MED ORDER — LACTATED RINGERS IV SOLN
INTRAVENOUS | Status: DC
  Administered 2012-01-04: 13:00:00 via INTRAVENOUS

## 2012-01-04 MED ORDER — FENTANYL CITRATE 0.05 MG/ML IJ SOLN
INTRAMUSCULAR | Status: AC
  Filled 2012-01-04: qty 2

## 2012-01-04 MED ORDER — ONDANSETRON HCL 4 MG PO TABS: 4.0000 mg | ORAL_TABLET | Freq: Four times a day (QID) | ORAL | Status: DC | PRN

## 2012-01-04 MED ORDER — MORPHINE SULFATE 2 MG/ML IJ SOLN
1.0000 mg | INTRAMUSCULAR | Status: DC | PRN
  Administered 2012-01-04: 2 mg via INTRAVENOUS
  Filled 2012-01-04: qty 1

## 2012-01-04 MED ORDER — CEFAZOLIN SODIUM 1-5 GM-% IV SOLN
1.0000 g | Freq: Four times a day (QID) | INTRAVENOUS | Status: DC
  Administered 2012-01-04 – 2012-01-05 (×2): 1 g via INTRAVENOUS
  Filled 2012-01-04 (×3): qty 50

## 2012-01-04 MED ORDER — HYDROCODONE-ACETAMINOPHEN 5-325 MG PO TABS: 1.0000 | ORAL_TABLET | ORAL | Status: DC | PRN

## 2012-01-04 MED ORDER — ONDANSETRON HCL 4 MG/2ML IJ SOLN
INTRAMUSCULAR | Status: DC | PRN
  Administered 2012-01-04: 4 mg via INTRAVENOUS

## 2012-01-04 MED ORDER — FENTANYL CITRATE 0.05 MG/ML IJ SOLN
50.0000 ug | INTRAMUSCULAR | Status: DC | PRN
  Administered 2012-01-04: 100 ug via INTRAVENOUS

## 2012-01-04 MED ORDER — SODIUM CHLORIDE 0.9 % IJ SOLN
INTRAMUSCULAR | Status: DC | PRN
  Administered 2012-01-04 (×6): 10 mL

## 2012-01-04 MED ORDER — ACETAMINOPHEN 325 MG PO TABS
650.0000 mg | ORAL_TABLET | Freq: Every day | ORAL | Status: DC
  Administered 2012-01-05: 650 mg via ORAL
  Filled 2012-01-04: qty 2

## 2012-01-04 MED ORDER — SODIUM CHLORIDE 0.9 % IV SOLN
INTRAVENOUS | Status: DC
  Administered 2012-01-04: 20:00:00 via INTRAVENOUS

## 2012-01-04 MED ORDER — HYDROCHLOROTHIAZIDE 12.5 MG PO CAPS
25.0000 mg | ORAL_CAPSULE | Freq: Every day | ORAL | Status: DC
  Filled 2012-01-04 (×2): qty 2

## 2012-01-04 MED ORDER — DIPHENHYDRAMINE HCL 25 MG PO TABS
25.0000 mg | ORAL_TABLET | Freq: Every day | ORAL | Status: DC
  Administered 2012-01-05: 25 mg via ORAL
  Filled 2012-01-04 (×2): qty 1

## 2012-01-04 MED ORDER — LOSARTAN POTASSIUM 50 MG PO TABS
50.0000 mg | ORAL_TABLET | Freq: Every day | ORAL | Status: DC
  Administered 2012-01-04: 50 mg via ORAL
  Filled 2012-01-04 (×2): qty 1

## 2012-01-04 MED ORDER — HYDROMORPHONE HCL PF 1 MG/ML IJ SOLN
INTRAMUSCULAR | Status: AC
  Filled 2012-01-04: qty 1

## 2012-01-04 MED ORDER — HYDROCODONE-ACETAMINOPHEN 5-325 MG PO TABS
1.0000 | ORAL_TABLET | ORAL | Status: DC | PRN
  Administered 2012-01-05: 1 via ORAL
  Filled 2012-01-04: qty 1

## 2012-01-04 MED ORDER — MIDAZOLAM HCL 2 MG/2ML IJ SOLN
1.0000 mg | INTRAMUSCULAR | Status: DC | PRN
  Administered 2012-01-04: 2 mg via INTRAVENOUS

## 2012-01-04 MED ORDER — ENOXAPARIN SODIUM 40 MG/0.4ML ~~LOC~~ SOLN
40.0000 mg | SUBCUTANEOUS | Status: DC
  Filled 2012-01-04: qty 0.4

## 2012-01-04 MED ORDER — ONDANSETRON HCL 4 MG/2ML IJ SOLN: 4.0000 mg | Freq: Four times a day (QID) | INTRAMUSCULAR | Status: DC | PRN

## 2012-01-04 MED ORDER — HYDROCHLOROTHIAZIDE 12.5 MG PO CAPS
12.5000 mg | ORAL_CAPSULE | Freq: Every day | ORAL | Status: DC
  Filled 2012-01-04: qty 1

## 2012-01-04 MED ORDER — LORATADINE 10 MG PO TABS
10.0000 mg | ORAL_TABLET | Freq: Every day | ORAL | Status: DC | PRN
  Filled 2012-01-04: qty 1

## 2012-01-04 MED ORDER — FLUTICASONE-SALMETEROL 250-50 MCG/DOSE IN AEPB
1.0000 | INHALATION_SPRAY | Freq: Two times a day (BID) | RESPIRATORY_TRACT | Status: DC
  Administered 2012-01-04 – 2012-01-05 (×2): 1 via RESPIRATORY_TRACT
  Filled 2012-01-04: qty 14

## 2012-01-04 MED ORDER — BUPIVACAINE-EPINEPHRINE 0.5% -1:200000 IJ SOLN
INTRAMUSCULAR | Status: DC | PRN
  Administered 2012-01-04: 30 mL

## 2012-01-04 MED ORDER — LIDOCAINE HCL (CARDIAC) 20 MG/ML IV SOLN
INTRAVENOUS | Status: DC | PRN
  Administered 2012-01-04: 70 mg via INTRAVENOUS

## 2012-01-04 MED ORDER — PROPOFOL 10 MG/ML IV BOLUS
INTRAVENOUS | Status: DC | PRN
  Administered 2012-01-04: 30 mg via INTRAVENOUS
  Administered 2012-01-04: 200 mg via INTRAVENOUS

## 2012-01-04 MED ORDER — MONTELUKAST SODIUM 10 MG PO TABS
10.0000 mg | ORAL_TABLET | Freq: Every day | ORAL | Status: DC
  Administered 2012-01-04: 10 mg via ORAL
  Filled 2012-01-04 (×2): qty 1

## 2012-01-04 MED ORDER — METHYLENE BLUE 1 % INJ SOLN
INTRAMUSCULAR | Status: AC
  Filled 2012-01-04: qty 10

## 2012-01-04 MED ORDER — SODIUM CHLORIDE 0.9 % IJ SOLN
INTRAMUSCULAR | Status: DC | PRN
  Administered 2012-01-04: 15:00:00 via SUBCUTANEOUS

## 2012-01-04 MED ORDER — FENTANYL CITRATE 0.05 MG/ML IJ SOLN
INTRAMUSCULAR | Status: DC | PRN
  Administered 2012-01-04 (×3): 50 ug via INTRAVENOUS
  Administered 2012-01-04 (×2): 25 ug via INTRAVENOUS
  Administered 2012-01-04: 50 ug via INTRAVENOUS
  Administered 2012-01-04 (×2): 25 ug via INTRAVENOUS
  Administered 2012-01-04 (×2): 50 ug via INTRAVENOUS

## 2012-01-04 MED ORDER — BUPIVACAINE-EPINEPHRINE (PF) 0.5% -1:200000 IJ SOLN
INTRAMUSCULAR | Status: AC
  Filled 2012-01-04: qty 10

## 2012-01-04 MED ORDER — TECHNETIUM TC 99M SULFUR COLLOID FILTERED
1.0000 | Freq: Once | INTRAVENOUS | Status: AC | PRN
  Administered 2012-01-04: 1 via INTRADERMAL

## 2012-01-04 SURGICAL SUPPLY — 63 items
ADH SKN CLS APL DERMABOND .7 (GAUZE/BANDAGES/DRESSINGS)
APPLIER CLIP 9.375 MED OPEN (MISCELLANEOUS)
APR CLP MED 9.3 20 MLT OPN (MISCELLANEOUS)
BINDER BREAST LRG (GAUZE/BANDAGES/DRESSINGS) IMPLANT
BINDER BREAST XLRG (GAUZE/BANDAGES/DRESSINGS) IMPLANT
BINDER BREAST XXLRG (GAUZE/BANDAGES/DRESSINGS) ×1 IMPLANT
BNDG COHESIVE 4X5 TAN STRL (GAUZE/BANDAGES/DRESSINGS) ×2 IMPLANT
CANISTER SUCTION 2500CC (MISCELLANEOUS) ×2 IMPLANT
CHLORAPREP W/TINT 26ML (MISCELLANEOUS) ×2 IMPLANT
CLIP APPLIE 9.375 MED OPEN (MISCELLANEOUS) IMPLANT
CLIP TI MEDIUM 24 (CLIP) ×1 IMPLANT
CLIP TI MEDIUM 6 (CLIP) ×2 IMPLANT
CLIP TI WIDE RED SMALL 6 (CLIP) ×3 IMPLANT
CLOTH BEACON ORANGE TIMEOUT ST (SAFETY) ×2 IMPLANT
CONT SPEC 4OZ CLIKSEAL STRL BL (MISCELLANEOUS) ×2 IMPLANT
CONT SPECI 4OZ STER CLIK (MISCELLANEOUS) ×6 IMPLANT
COVER SURGICAL LIGHT HANDLE (MISCELLANEOUS) ×2 IMPLANT
COVER TRANSDUCER ULTRASND GEL (DRAPE) ×2 IMPLANT
DERMABOND ADVANCED (GAUZE/BANDAGES/DRESSINGS)
DERMABOND ADVANCED .7 DNX12 (GAUZE/BANDAGES/DRESSINGS) IMPLANT
DRAIN CHANNEL 19F RND (DRAIN) ×3 IMPLANT
DRAPE UTILITY 15X26 W/TAPE STR (DRAPE) ×4 IMPLANT
ELECT CAUTERY BLADE 6.4 (BLADE) ×2 IMPLANT
ELECT REM PT RETURN 9FT ADLT (ELECTROSURGICAL) ×2
ELECTRODE REM PT RTRN 9FT ADLT (ELECTROSURGICAL) ×1 IMPLANT
EVACUATOR SILICONE 100CC (DRAIN) ×3 IMPLANT
GLOVE BIO SURGEON STRL SZ 6 (GLOVE) ×2 IMPLANT
GLOVE BIO SURGEON STRL SZ7.5 (GLOVE) ×1 IMPLANT
GLOVE BIOGEL PI IND STRL 6.5 (GLOVE) ×1 IMPLANT
GLOVE BIOGEL PI IND STRL 7.5 (GLOVE) IMPLANT
GLOVE BIOGEL PI INDICATOR 6.5 (GLOVE) ×1
GLOVE BIOGEL PI INDICATOR 7.5 (GLOVE) ×1
GOWN PREVENTION PLUS XXLARGE (GOWN DISPOSABLE) ×4 IMPLANT
GOWN STRL NON-REIN LRG LVL3 (GOWN DISPOSABLE) ×6 IMPLANT
KIT BASIN OR (CUSTOM PROCEDURE TRAY) ×2 IMPLANT
KIT ROOM TURNOVER OR (KITS) ×2 IMPLANT
NDL 18GX1X1/2 (RX/OR ONLY) (NEEDLE) ×1 IMPLANT
NDL HYPO 25GX1X1/2 BEV (NEEDLE) ×1 IMPLANT
NDL SPNL 22GX3.5 QUINCKE BK (NEEDLE) ×1 IMPLANT
NEEDLE 18GX1X1/2 (RX/OR ONLY) (NEEDLE) ×2 IMPLANT
NEEDLE HYPO 25GX1X1/2 BEV (NEEDLE) ×2 IMPLANT
NEEDLE SPNL 22GX3.5 QUINCKE BK (NEEDLE) ×2 IMPLANT
NS IRRIG 1000ML POUR BTL (IV SOLUTION) ×2 IMPLANT
PACK GENERAL/GYN (CUSTOM PROCEDURE TRAY) ×2 IMPLANT
PACK UNIVERSAL I (CUSTOM PROCEDURE TRAY) ×2 IMPLANT
PAD ARMBOARD 7.5X6 YLW CONV (MISCELLANEOUS) ×4 IMPLANT
PEN SKIN MARKING BROAD (MISCELLANEOUS) ×2 IMPLANT
SPECIMEN JAR X LARGE (MISCELLANEOUS) ×2 IMPLANT
SPONGE GAUZE 4X4 12PLY (GAUZE/BANDAGES/DRESSINGS) ×2 IMPLANT
SPONGE LAP 18X18 X RAY DECT (DISPOSABLE) ×1 IMPLANT
STAPLER VISISTAT 35W (STAPLE) ×2 IMPLANT
STOCKINETTE IMPERVIOUS 9X36 MD (GAUZE/BANDAGES/DRESSINGS) ×2 IMPLANT
STRIP CLOSURE SKIN 1/2X4 (GAUZE/BANDAGES/DRESSINGS) ×4 IMPLANT
SUT ETHILON 2 0 FS 18 (SUTURE) ×3 IMPLANT
SUT MON AB 4-0 PC3 18 (SUTURE) ×2 IMPLANT
SUT SILK 2 0 (SUTURE) ×2
SUT SILK 2 0 FS (SUTURE) ×2 IMPLANT
SUT SILK 2-0 18XBRD TIE 12 (SUTURE) ×1 IMPLANT
SUT VIC AB 3-0 SH 8-18 (SUTURE) ×5 IMPLANT
SYR 50ML SLIP (SYRINGE) ×2 IMPLANT
SYR CONTROL 10ML LL (SYRINGE) ×2 IMPLANT
TOWEL OR 17X24 6PK STRL BLUE (TOWEL DISPOSABLE) ×2 IMPLANT
TOWEL OR 17X26 10 PK STRL BLUE (TOWEL DISPOSABLE) ×2 IMPLANT

## 2012-01-04 NOTE — Transfer of Care (Signed)
Immediate Anesthesia Transfer of Care Note  Patient: Ann Maxwell  Procedure(s) Performed: Procedure(s) (LRB) with comments: MASTECTOMY WITH SENTINEL LYMPH NODE BIOPSY (Left)  Patient Location: PACU  Anesthesia Type: General  Level of Consciousness: awake, alert , oriented and patient cooperative  Airway & Oxygen Therapy: Patient Spontanous Breathing and Patient connected to nasal cannula oxygen  Post-op Assessment: Report given to PACU RN and Post -op Vital signs reviewed and stable  Post vital signs: Reviewed and stable  Complications: No apparent anesthesia complications

## 2012-01-04 NOTE — Anesthesia Postprocedure Evaluation (Signed)
  Anesthesia Post-op Note  Patient: Ann Maxwell  Procedure(s) Performed: Procedure(s) (LRB) with comments: MASTECTOMY WITH SENTINEL LYMPH NODE BIOPSY (Left)  Patient Location: PACU  Anesthesia Type: General  Level of Consciousness: awake, alert , oriented and patient cooperative  Airway and Oxygen Therapy: Patient Spontanous Breathing  Post-op Pain: mild  Post-op Assessment: Post-op Vital signs reviewed, Patient's Cardiovascular Status Stable, Respiratory Function Stable, Patent Airway, No signs of Nausea or vomiting and Pain level controlled  Post-op Vital Signs: stable  Complications: No apparent anesthesia complications

## 2012-01-04 NOTE — Preoperative (Signed)
Beta Blockers   Reason not to administer Beta Blockers:Not Applicable 

## 2012-01-04 NOTE — Interval H&P Note (Signed)
History and Physical Interval Note:  01/04/2012 1:10 PM  Ann Maxwell  has presented today for surgery, with the diagnosis of left breast DCIS  The various methods of treatment have been discussed with the patient and family. After consideration of risks, benefits and other options for treatment, the patient has consented to  Procedure(s) (LRB) with comments: MASTECTOMY WITH SENTINEL LYMPH NODE BIOPSY (Left) - left mastectomy sentinel lymph node biopsy as a surgical intervention .  The patient's history has been reviewed, patient examined, no change in status, stable for surgery.  I have reviewed the patient's chart and labs.  Questions were answered to the patient's satisfaction.     Billyjoe Go

## 2012-01-04 NOTE — Anesthesia Preprocedure Evaluation (Signed)
Anesthesia Evaluation  Patient identified by MRN, date of birth, ID band Patient awake    Reviewed: Allergy & Precautions, H&P , NPO status , Patient's Chart, lab work & pertinent test results  Airway Mallampati: II  Neck ROM: full    Dental   Pulmonary asthma ,          Cardiovascular hypertension,     Neuro/Psych    GI/Hepatic GERD-  ,  Endo/Other  Morbid obesity  Renal/GU      Musculoskeletal  (+) Arthritis -,   Abdominal   Peds  Hematology   Anesthesia Other Findings   Reproductive/Obstetrics Breast CA                           Anesthesia Physical Anesthesia Plan  ASA: III  Anesthesia Plan: General   Post-op Pain Management:    Induction: Intravenous  Airway Management Planned: LMA  Additional Equipment:   Intra-op Plan:   Post-operative Plan:   Informed Consent: I have reviewed the patients History and Physical, chart, labs and discussed the procedure including the risks, benefits and alternatives for the proposed anesthesia with the patient or authorized representative who has indicated his/her understanding and acceptance.     Plan Discussed with: CRNA and Surgeon  Anesthesia Plan Comments:         Anesthesia Quick Evaluation

## 2012-01-04 NOTE — H&P (View-Only) (Signed)
Chief complaint:  Left breast DCIS   HISTORY: Pt presents on 6 month follow up for abnormal calcifications.  She had progression of calcifications and underwent biopsy.  Biopsy was positive for DCIS.  Mammogram demonstrated 8 cm area of calcs and MRI demonstrated 10 cm calcifications.  She has not ever palpated a mass.  She denies nipple discharge.  She has no family history of breast cancer.  Her mother had ovarian cancer and maternal grandmother had uterine cancer.  She does have asthma and "shoulder bursitis."    Past Medical History  Diagnosis Date  . Asthma   . Hypertension   . Osteoarthritis   . Chest pain   . Palpitations   . Obesity     Past Surgical History  Procedure Date  . Tonsillectomy and adenoidectomy   . Tvh and bso 1994  . Tibia fracture surgery 1995    and fibular  with int fx  . Cholecystectomy     Current Outpatient Prescriptions  Medication Sig Dispense Refill  . aspirin 81 MG tablet Take 81 mg by mouth daily.      Marland Kitchen co-enzyme Q-10 30 MG capsule Take 30 mg by mouth daily.      . Fluticasone-Salmeterol (ADVAIR) 250-50 MCG/DOSE AEPB Inhale 1 puff into the lungs 2 (two) times daily.      . hydrochlorothiazide (MICROZIDE) 12.5 MG capsule Take 12.5 mg by mouth daily.      Marland Kitchen ipratropium-albuterol (DUONEB) 0.5-2.5 (3) MG/3ML SOLN Take 3 mLs by nebulization as needed.      . loratadine (CLARITIN) 10 MG tablet Take 10 mg by mouth as needed.      Marland Kitchen losartan-hydrochlorothiazide (HYZAAR) 50-12.5 MG per tablet Take 1 tablet by mouth daily.      . montelukast (SINGULAIR) 10 MG tablet Take 10 mg by mouth at bedtime.         Allergies  Allergen Reactions  . Terbinafine And Related   . Adhesive (Tape) Other (See Comments)    Surgical tape causes blisters  . Oxycodone-Acetaminophen Nausea Only     Family History  Problem Relation Age of Onset  . Heart attack Father   . Hypertension Father   . Heart disease Father   . Cancer Mother     ovarian  . Cancer  Maternal Grandmother     uterine  . Cancer Paternal Grandfather     liver  . Cancer Sister     thyroid     History   Social History  . Marital Status: Divorced    Spouse Name: N/A    Number of Children: N/A  . Years of Education: N/A   Occupational History  . retired Engineer, civil (consulting)    Social History Main Topics  . Smoking status: Never Smoker   . Smokeless tobacco: Not on file  . Alcohol Use: No  . Drug Use: No  . Sexually Active: Not on file   Other Topics Concern  . Not on file   Social History Narrative   Retired Risk analyst, 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 who is a Engineer, civil (consulting)     REVIEW OF SYSTEMS - PERTINENT POSITIVES ONLY: 12 point review of systems negative other than HPI and PMH except for hot flashes, fatigue, pain, occasional stress incontinence.    EXAM: Wt Readings from Last 3 Encounters:  12/20/11 241 lb 6.4 oz (109.498 kg)   Temp Readings from Last 3 Encounters:  12/20/11 97.6 F (36.4 C)    BP Readings from Last 3 Encounters:  12/20/11  137/80   Pulse Readings from Last 3 Encounters:  12/20/11 106    Gen:  No acute distress.  Well nourished and well groomed.   Neurological: Alert and oriented to person, place, and time. Coordination normal.  Head: Normocephalic and atraumatic.  Eyes: Conjunctivae are normal. Pupils are equal, round, and reactive to light. No scleral icterus.  Neck: Normal range of motion. Neck supple. No tracheal deviation or thyromegaly present.  Cardiovascular: Normal rate, regular rhythm, normal heart sounds and intact distal pulses.  Exam reveals no gallop and no friction rub.  No murmur heard. Breast:  No palpable masses, nipple retraction, or nipple discharge in either breast.  Breasts are ptotic bilaterally.  Right breast is slightly larger.  No skin edema or change in skin contour.  No lymphadenopathy.  Some bruising at left sided biopsy site.  Both breasts slightly denser than average.   Respiratory: Effort normal.  No respiratory distress.  No chest wall tenderness. Breath sounds normal.  No wheezes, rales or rhonchi.  GI: Soft. Bowel sounds are normal. The abdomen is soft and nontender.  There is no rebound and no guarding. Right subcostal incision.   Musculoskeletal: Normal range of motion. Extremities are nontender.  Lymphadenopathy: No cervical, preauricular, postauricular or axillary adenopathy is present Skin: Skin is warm and dry. No rash noted. No diaphoresis. No erythema. No pallor. No clubbing, cyanosis, or edema.   Psychiatric: Normal mood and affect. Behavior is normal. Judgment and thought content normal.    LABORATORY RESULTS: Available labs are reviewed  High grade DCIS with comedonecrosis and calcification.  ER +/PR +   RADIOLOGY RESULTS: See E-Chart or I-Site for most recent results.  Images and reports are reviewed.  Mammogram STEREOTACTIC-GUIDED VACUUM ASSISTED BIOPSY OF THE LEFT BREAST AND  SPECIMEN RADIOGRAPH  I met with the patient, and we discussed the procedure of  stereotactic-guided biopsy, including risks. Specifically, we  discussed the risks of bleeding, infection and clip migration. We  discussed the high likelihood of a successful procedure. Informed,  written consent was given.  Using sterile technique, local anesthesia, stereotactic guidance,  and a 9 gauge vacuum assisted device, biopsy was performed of the  recently demonstrated 8 x 4 x 3 cm area of calcifications in the  lower outer quadrant of the left breast seen at [facility]. An inferior approach was used. A specimen radiograph was  performed, showing multiple calcifications within multiple  specimens. Specimens with calcifications were identified for  pathology.  At the conclusion of the procedure, a tissue marker clip was  deployed into the biopsy cavity. A follow-up 2-view mammogram  demonstrates a cylinder shaped biopsy marker clip at the location  of the biopsied calcifications.  IMPRESSION:    Stereotactic-guided biopsy of an 8 x 4 x 3 cm area of  calcifications in the lower outer quadrant of the left breast. No  apparent complications.   Breast MR IMPRESSION:  1. Large segmental area of clumped enhancement in the lower outer  quadrant of the left breast spans approximately 10 x 5 x 3.5 cm on  MRI. The biopsy clip is within the anterior half of the abnormal  enhancement. If an additional biopsy is desired to confirm extent  of disease, a stereotactic biopsy of the more posterior  calcifications in the outer left breast could be considered.  2. Marked, complex parenchymal enhancement pattern in both  breasts. No specific MRI evidence of malignancy in the right  breast, above the background enhancement. Given  the complex  parenchymal pattern, and the patient's left breast DCIS, follow-up  breast MRI in 6-12 months is should be considered.  RECOMMENDATION:  Treatment plan for patient's known high-grade DCIS of the left  breast. Consider breast MRI in 6-12 months for evaluation of  complex parenchymal enhancement pattern in the right breast, which  may limit the sensitivity for detecting malignancy.    ASSESSMENT AND PLAN: left breast DCIS 10 cm Pt with large area of DCIS not amenable to BCT.  Will plan left simple mastectomy with sentinel lymph node biopsy.  Discussed risks and benefits of surgery. The surgical procedure was described to the patient.  I discussed the incision type and location and that we would need radiology involved on the day of surgery with a sentinel node.      The risks and benefits of the procedure were described to the patient and she wishes to proceed.    We discussed the risks bleeding, infection, damage to other structures, need for further procedures/surgeries.  We discussed the risk of seroma. The patient was advised that these are the most common complications, but that others can occur as well.  They were advised against taking aspirin or  other anti-inflammatory agents/blood thinners the week before surgery.    The patient declines plastics referral for reconstruction.  She was advised to go to [facility] for bra fitting pre operatively.    45 minutes total were spent with counseling, exam, and coordination of care.       Maudry Diego MD Surgical Oncology, General and Endocrine Surgery [facility]      Visit Diagnoses: 1. left breast DCIS 10 cm     Primary Care Physician: Warrick Parisian, MD   Drue Second MD  Chipper Herb MD

## 2012-01-04 NOTE — Progress Notes (Signed)
Patient arrived in OR holding area. No previous IV in place

## 2012-01-04 NOTE — Op Note (Signed)
Left Mastectomy with Sentinel Node Biopsy Procedure Note  Indications: This patient presents with history of left breast DCIS with clinically negative axillary lymph node exam.  Pre-operative Diagnosis: Left breast DCIS  Post-operative Diagnosis:  same  Surgeon: Almond Lint   Anesthesia: General endotracheal anesthesia and Local anesthesia 0.25.% bupivacaine, with epinephrine  ASA Class: 3  Procedure Details  The patient was seen in the Holding Room. The risks, benefits, complications, treatment options, and expected outcomes were discussed with the patient. The possibilities of reaction to medication, pulmonary aspiration, bleeding, infection, the need for additional procedures, failure to diagnose a condition, and creating a complication requiring transfusion or operation were discussed with the patient. The patient concurred with the proposed plan, giving informed consent.  The site of surgery properly noted/marked. The patient was taken to Operating Room # 2, identified, and the procedure verified as left Mastectomy and Sentinel Node Biopsy. A Time Out was held and the above information confirmed.  The methylene blue was injected in the subareolar location.    After induction of anesthesia, the right arm, breast, and chest were prepped and draped in standard fashion.  The borders of the breast were identified and marked.  The incisions of the breast was drawn out to make sure incision lines were equidistant in length.  The superior incision was made with the #10 blade.  The marcaine/saline mixture was infiltrated into the superior flap.  Mastectomy hooks were used to provide elevation of the skin edges, and the curved Mayo scissors were used to create the mastectomy flaps.  The dissection was taken to the fascia of the pectoralis major.  The penetrating vessels were clipped.  The superior flap was taken medially to the lateral sternal border, superiorly to the inferior border of the clavicle.   The inferior flap was similarly created, inferiorly to the inframammary fold and laterally to the border of the latissimus.  The breast was taken off including the pectoralis fascia and the axillary tail marked.    Using a hand-held gamma probe, axillary sentinel nodes were identified.  6 level 2 axillary sentinel nodes were removed and submitted to pathology.  The findings are below.  The lymphovascular channels were clipped with metal clips.        The wound was irrigated.  An OnQ catheter was placed medially.  Two 19 Blake drains were placed laterally.   Hemostasis was achieved with cautery.  The wound was irrigated and closed with a 3-0 Vicryl deep dermal interrupted sutures and 4-0 Vicryl subcuticular closure in layers.    Sterile dressings were applied. At the end of the operation, all sponge, instrument, and needle counts were correct.  Findings: grossly clear surgical margins, 6 axillary SLNs #1 Palpable #2 hot cps 926 #3 hot/blue cps 410 #4 hot cps 770 #5 hot cps 2100 #6 hot cps 270 Background cps 9  Estimated Blood Loss:  less than 50 mL         Drains: 2 19 Blakes                Specimens: L breast and 6 axillary sentinel nodes         Complications:  None; patient tolerated the procedure well.         Disposition: PACU - hemodynamically stable.         Condition: stable

## 2012-01-04 NOTE — Anesthesia Procedure Notes (Signed)
Procedure Name: LMA Insertion Date/Time: 01/04/2012 1:33 PM Performed by: Gayla Medicus Pre-anesthesia Checklist: Patient identified, Emergency Drugs available, Suction available, Patient being monitored and Timeout performed Patient Re-evaluated:Patient Re-evaluated prior to inductionOxygen Delivery Method: Circle system utilized Preoxygenation: Pre-oxygenation with 100% oxygen Intubation Type: IV induction LMA: LMA inserted LMA Size: 4.0 Number of attempts: 1 Placement Confirmation: positive ETCO2 and breath sounds checked- equal and bilateral Tube secured with: Tape Dental Injury: Teeth and Oropharynx as per pre-operative assessment

## 2012-01-05 ENCOUNTER — Encounter: Payer: Self-pay | Admitting: *Deleted

## 2012-01-05 ENCOUNTER — Encounter (HOSPITAL_COMMUNITY): Payer: Self-pay | Admitting: *Deleted

## 2012-01-05 ENCOUNTER — Telehealth (INDEPENDENT_AMBULATORY_CARE_PROVIDER_SITE_OTHER): Payer: Self-pay

## 2012-01-05 LAB — CBC
MCH: 21.1 pg — ABNORMAL LOW (ref 26.0–34.0)
MCHC: 29.4 g/dL — ABNORMAL LOW (ref 30.0–36.0)
MCV: 71.6 fL — ABNORMAL LOW (ref 78.0–100.0)
Platelets: 333 10*3/uL (ref 150–400)
RDW: 17.8 % — ABNORMAL HIGH (ref 11.5–15.5)

## 2012-01-05 LAB — BASIC METABOLIC PANEL
Calcium: 8.9 mg/dL (ref 8.4–10.5)
Creatinine, Ser: 0.62 mg/dL (ref 0.50–1.10)
GFR calc Af Amer: >90 mL/min (ref >90)

## 2012-01-05 MED ORDER — PNEUMOCOCCAL VAC POLYVALENT 25 MCG/0.5ML IJ INJ: 0.5000 mL | INJECTION | INTRAMUSCULAR | Status: DC

## 2012-01-05 MED ORDER — POTASSIUM CHLORIDE CRYS ER 20 MEQ PO TBCR
40.0000 meq | EXTENDED_RELEASE_TABLET | Freq: Every day | ORAL | Status: DC
  Filled 2012-01-05: qty 2

## 2012-01-05 NOTE — Progress Notes (Signed)
UR completed 

## 2012-01-05 NOTE — Telephone Encounter (Signed)
LMOV appt with Dr. Donell Beers 01/29/12 at 9:45am.

## 2012-01-05 NOTE — Discharge Summary (Signed)
Physician Discharge Summary  Patient ID: Ann Maxwell MRN: [id number] DOB/AGE: 69-Aug-1942 69 y.o.  Admit date: 01/04/2012 Discharge date: 01/05/2012  Admission Diagnoses: Left breast DCIS Asthma HTN  Discharge Diagnoses:  Same  Discharged Condition: good  Hospital Course:  Pt was admitted overnight following her mastectomy and sentinel lymph node biopsy.  She had good pain control overnight, and she was able to tolerate a regular diet.  She did have a small hematoma at the lateral border of the flap.  She was able to ambulate and void spontaneously.  She was discharged to home in good condition.    Consults: None  Significant Diagnostic Studies: labs: K 3.4  Treatments: surgery: left mastectomy and sentinel node biopsy  Discharge Exam: Blood pressure 104/53, pulse 80, temperature 98.3 F (36.8 C), temperature source Oral, resp. rate 18, height 5\' 4"  (1.626 m), weight 233 lb (105.688 kg), SpO2 96.00%. General appearance: alert, cooperative and no distress Head: Normocephalic, without obvious abnormality, atraumatic Chest wall: flat left mastectomy flaps except at very lateral portion where small hematoma is present GI: soft, non-tender; bowel sounds normal; no masses,  no organomegaly Extremities: extremities normal, atraumatic, no cyanosis or edema  Disposition:   Discharge Orders    Future Appointments: Provider: Department: Dept Phone: Center:   01/18/2012 11:45 AM Krista Blue [facility] [phone] None   01/18/2012 12:00 PM Victorino December, MD [facility] [phone] None   02/15/2012 10:00 AM Linard Millers, COUNS [facility] [phone] None   02/15/2012 11:00 AM Radene Gunning [facility] [phone] None       Medication List     As of 01/05/2012  8:34 AM    TAKE these medications         acetaminophen 325 MG tablet   Commonly known as: TYLENOL   Take 650 mg by mouth at bedtime.      aspirin EC 81 MG tablet   Take 81 mg  by mouth daily.      CHONDROITIN SULFATE A PO   Take 1 tablet by mouth daily.      co-enzyme Q-10 30 MG capsule   Take 30 mg by mouth daily.      diphenhydrAMINE 25 MG tablet   Commonly known as: BENADRYL   Take 25 mg by mouth at bedtime.      fish oil-omega-3 fatty acids 1000 MG capsule   Take 1 g by mouth daily.      Fluticasone-Salmeterol 250-50 MCG/DOSE Aepb   Commonly known as: ADVAIR   Inhale 1 puff into the lungs 2 (two) times daily.      hydrochlorothiazide 12.5 MG capsule   Commonly known as: MICROZIDE   Take 12.5 mg by mouth daily.      HYDROcodone-acetaminophen 5-325 MG per tablet   Commonly known as: NORCO/VICODIN   Take 1-2 tablets by mouth every 4 (four) hours as needed for pain.      ipratropium-albuterol 0.5-2.5 (3) MG/3ML Soln   Commonly known as: DUONEB   Take 3 mLs by nebulization every 6 (six) hours as needed. For wheezing      loratadine 10 MG tablet   Commonly known as: CLARITIN   Take 10 mg by mouth daily as needed. For allergies      losartan-hydrochlorothiazide 50-12.5 MG per tablet   Commonly known as: HYZAAR   Take 1 tablet by mouth daily.      montelukast 10 MG tablet   Commonly known as: SINGULAIR   Take 10  mg by mouth at bedtime.           Follow-up Information    Follow up with [facility], MD. Schedule an appointment as soon as possible for a visit in 2 weeks.   Contact information:   [address] [place] [address] [phone]          Signed: Almond Lint 01/05/2012, 8:34 AM

## 2012-01-05 NOTE — Progress Notes (Signed)
[facility]  Clinical Social Work  Clinical Social Work was referred by Tamala Julian, LCSW, to complete advance directives.  CSW met with patient and patient's friend in CSW office.  We completed patient's healthcare power of attorney andLiving will  documents.  She has designated her daughter, Ann Maxwell as her healthcare agent, and her friend Ann Maxwell, as her secondary healthcare agent.    CSW will send documents to medical records to be scanned into patient's chart. To access directives go to Chart Review > Media Tab > look under Advance Directives or Clinical Social Work.   Ann Maxwell, MSW, LCSW Clinical Social Worker [facility] [phone]

## 2012-01-09 ENCOUNTER — Telehealth (INDEPENDENT_AMBULATORY_CARE_PROVIDER_SITE_OTHER): Payer: Self-pay | Admitting: General Surgery

## 2012-01-09 NOTE — Telephone Encounter (Signed)
Doing well.    Advised of pathology.  Lymph nodes negative.   Small focus of cancer (5mm)

## 2012-01-17 ENCOUNTER — Encounter (INDEPENDENT_AMBULATORY_CARE_PROVIDER_SITE_OTHER): Payer: Self-pay | Admitting: General Surgery

## 2012-01-17 ENCOUNTER — Ambulatory Visit (INDEPENDENT_AMBULATORY_CARE_PROVIDER_SITE_OTHER): Payer: Medicare Other | Admitting: General Surgery

## 2012-01-17 VITALS — BP 140/80 | HR 89 | Temp 96.9°F | Resp 18 | Ht 64.0 in | Wt 237.4 lb

## 2012-01-17 DIAGNOSIS — C50519 Malignant neoplasm of lower-outer quadrant of unspecified female breast: Secondary | ICD-10-CM

## 2012-01-17 NOTE — Assessment & Plan Note (Addendum)
Had 5 mm area of invasive cancer.   Has appt with Dr. Welton Flakes to discuss anti hormone treatment.  She is concerned because her father had a stroke.  Drains pulled.    Follow up with me in 4 weeks.

## 2012-01-17 NOTE — Progress Notes (Signed)
HISTORY: S/p left mastectomy for DCIS.  Drain output has come down to < 20 ml/day x 3 consec days in both drains.  C/o some discomfort.  Area of hematoma has softened.  Not on narcotics anymore.      EXAM: General:  A&O Incision:  C/d/intact.  No seroma or erythema.  Area of hematoma on lateral upper flap.  Smaller than when discharged from hospital.       PATHOLOGY: 5 mm invasive cancer.     ASSESSMENT AND PLAN:   left breast DCIS 10 cm + [id number] left breast cancer.   Had 5 mm area of invasive cancer.   Has appt with Dr. Welton Flakes to discuss anti hormone treatment.  She is concerned because her father had a stroke.  Drains pulled.    Follow up with me in 4 weeks.      Ann Diego, MD Surgical Oncology, General & Endocrine Surgery [facility], Colin Benton, MD Halina Maidens., MD

## 2012-01-17 NOTE — Patient Instructions (Addendum)
Dry dressing to drain site as needed.    Follow up in around 4 weeks.

## 2012-01-18 ENCOUNTER — Encounter: Payer: Self-pay | Admitting: Oncology

## 2012-01-18 ENCOUNTER — Encounter (INDEPENDENT_AMBULATORY_CARE_PROVIDER_SITE_OTHER): Payer: Medicare Other | Admitting: General Surgery

## 2012-01-18 ENCOUNTER — Other Ambulatory Visit (HOSPITAL_BASED_OUTPATIENT_CLINIC_OR_DEPARTMENT_OTHER): Payer: Medicare Other | Admitting: Lab

## 2012-01-18 ENCOUNTER — Telehealth: Payer: Self-pay | Admitting: *Deleted

## 2012-01-18 ENCOUNTER — Ambulatory Visit (HOSPITAL_BASED_OUTPATIENT_CLINIC_OR_DEPARTMENT_OTHER): Payer: Medicare Other | Admitting: Oncology

## 2012-01-18 VITALS — BP 123/77 | HR 82 | Temp 98.1°F | Resp 20 | Ht 64.0 in | Wt 236.6 lb

## 2012-01-18 DIAGNOSIS — Z170 Estrogen receptor positive status [ER+]: Secondary | ICD-10-CM

## 2012-01-18 DIAGNOSIS — E559 Vitamin D deficiency, unspecified: Secondary | ICD-10-CM

## 2012-01-18 DIAGNOSIS — C50519 Malignant neoplasm of lower-outer quadrant of unspecified female breast: Secondary | ICD-10-CM

## 2012-01-18 DIAGNOSIS — D509 Iron deficiency anemia, unspecified: Secondary | ICD-10-CM

## 2012-01-18 LAB — CBC WITH DIFFERENTIAL/PLATELET
BASO%: 0.6 % (ref 0.0–2.0)
EOS%: 5.2 % (ref 0.0–7.0)
LYMPH%: 15.7 % (ref 14.0–49.7)
MCHC: 31.3 g/dL — ABNORMAL LOW (ref 31.5–36.0)
MONO#: 0.6 10*3/uL (ref 0.1–0.9)
Platelets: 321 10*3/uL (ref 145–400)
RBC: 4.45 10*6/uL (ref 3.70–5.45)
WBC: 8.7 10*3/uL (ref 3.9–10.3)
lymph#: 1.4 10*3/uL (ref 0.9–3.3)

## 2012-01-18 LAB — COMPREHENSIVE METABOLIC PANEL (CC13)
ALT: 13 U/L (ref 0–55)
AST: 13 U/L (ref 5–34)
Alkaline Phosphatase: 106 U/L (ref 40–150)
CO2: 26 mEq/L (ref 22–29)
Sodium: 139 mEq/L (ref 136–145)
Total Bilirubin: 0.2 mg/dL (ref 0.20–1.20)
Total Protein: 6.6 g/dL (ref 6.4–8.3)

## 2012-01-18 MED ORDER — ANASTROZOLE 1 MG PO TABS: 1.0000 mg | ORAL_TABLET | Freq: Every day | ORAL | Status: AC

## 2012-01-18 NOTE — Progress Notes (Signed)
OFFICE PROGRESS NOTE  CC  STALLINGS,SHEILA, MD [address] [place] [address]  DIAGNOSIS: 71 year old female with 0.5 cm invasive ductal carcinoma of the left breast with DCIS status post mastectomy on 01/04/2012  PRIOR THERAPY:  #1 patient was seen in the multidisciplinary breast clinic in early September 2013 for a 10 cm ductal carcinoma in situ. The tumor was ER positive.  #2 patient has gone on to have a left mastectomy on 01/04/2012. Her final pathology revealed a 0.5 cm invasive ductal carcinoma with associated 9 cm DCIS. Sentinel lymph nodes were negative for metastatic disease. The tumor was ER positive PR positive HER-2/neu negative with Ki-67 17%.  #3 patient will now begin antiestrogen therapy with Arimidex 1 mg daily. Risks and benefits of treatment were discussed with the patient. Patient will not need radiation therapy since she has undergone a mastectomy. A total of 5 years of therapy is planned CURRENT THERAPY: Arimidex 1 mg daily beginning 01/18/2012  INTERVAL HISTORY: Ann Maxwell 71 y.o. female returns for followup visit today. Overall she is doing well she tolerated the surgery without any significant problems. Surgically she has done well. Her drains are out. She denies any nausea vomiting fevers chills night sweats headaches no shortness of breath chest pains or palpitations no myalgias or arthralgias. Remainder of the 10 point review of systems is negative.  MEDICAL HISTORY: Past Medical History  Diagnosis Date  . Asthma   . Hypertension   . Osteoarthritis   . Chest pain   . Palpitations   . Obesity   . Bronchitis   . Environmental allergies   . Vertigo   . Constipation   . Urgency of urination   . Frequency of urination   . Cancer 2013    left breast CA  . Dry skin   . GERD (gastroesophageal reflux disease)     heart burn occasionally    ALLERGIES:  is allergic to terbinafine and related; adhesive; and  oxycodone-acetaminophen.  MEDICATIONS:  Current Outpatient Prescriptions  Medication Sig Dispense Refill  . acetaminophen (TYLENOL) 325 MG tablet Take 650 mg by mouth at bedtime.      Marland Kitchen aspirin EC 81 MG tablet Take 81 mg by mouth daily.      . CHONDROITIN SULFATE A PO Take 1 tablet by mouth daily.      Marland Kitchen co-enzyme Q-10 30 MG capsule Take 30 mg by mouth daily.      . diphenhydrAMINE (BENADRYL) 25 MG tablet Take 25 mg by mouth at bedtime.      . fish oil-omega-3 fatty acids 1000 MG capsule Take 1 g by mouth daily.      . Fluticasone-Salmeterol (ADVAIR) 250-50 MCG/DOSE AEPB Inhale 1 puff into the lungs 2 (two) times daily.      . hydrochlorothiazide (MICROZIDE) 12.5 MG capsule Take 12.5 mg by mouth daily.      Marland Kitchen HYDROcodone-acetaminophen (NORCO) 5-325 MG per tablet Take 1-2 tablets by mouth every 4 (four) hours as needed for pain.  30 tablet  1  . ipratropium-albuterol (DUONEB) 0.5-2.5 (3) MG/3ML SOLN Take 3 mLs by nebulization every 6 (six) hours as needed. For wheezing      . loratadine (CLARITIN) 10 MG tablet Take 10 mg by mouth daily as needed. For allergies      . losartan-hydrochlorothiazide (HYZAAR) 50-12.5 MG per tablet Take 1 tablet by mouth daily.      . montelukast (SINGULAIR) 10 MG tablet Take 10 mg by mouth at bedtime.      Marland Kitchen  anastrozole (ARIMIDEX) 1 MG tablet Take 1 tablet (1 mg total) by mouth daily.  90 tablet  12  . celecoxib (CELEBREX) 200 MG capsule Take 200 mg by mouth daily as needed.        SURGICAL HISTORY:  Past Surgical History  Procedure Date  . Tonsillectomy and adenoidectomy   . Tvh and bso 1994  . Tibia fracture surgery 1995    and fibular  with int fx  . Cholecystectomy   . Abdominal hysterectomy   . Lithotripsy   . Lithectomy 2010  . Mastectomy w/ sentinel node biopsy 01/04/2012    Procedure: MASTECTOMY WITH SENTINEL LYMPH NODE BIOPSY;  Surgeon: Almond Lint, MD;  Location: [facility] OR;  Service: General;  Laterality: Left;    REVIEW OF SYSTEMS:  Pertinent  items are noted in HPI.   HEALTH MAINTENANCE:   PHYSICAL EXAMINATION: Blood pressure 123/77, pulse 82, temperature 98.1 F (36.7 C), temperature source Oral, resp. rate 20, height 5\' 4"  (1.626 m), weight 236 lb 9.6 oz (107.321 kg). Body mass index is 40.61 kg/(m^2). ECOG PERFORMANCE STATUS: 0 - Asymptomatic HEENT exam EOMI PERRLA sclerae anicteric no conjunctival pallor oral mucosa is moist neck is supple lungs are clear to auscultation and percussion cardiovascular is regular rate rhythm no murmurs abdomen is soft nontender nondistended bowel sounds are present no HSM extremities no edema neuro patient's alert oriented otherwise nonfocal. Left mastectomy scar is healing no nodularity no tenderness. Right breast no masses or nipple discharge.     LABORATORY DATA: Lab Results  Component Value Date   WBC 8.7 01/18/2012   HGB 9.5* 01/18/2012   HCT 30.3* 01/18/2012   MCV 68.1* 01/18/2012   PLT 321 01/18/2012      Chemistry      Component Value Date/Time   NA 139 01/18/2012 1134   NA 140 01/05/2012 0610   K 3.1* 01/18/2012 1134   K 3.4* 01/05/2012 0610   CL 103 01/18/2012 1134   CL 103 01/05/2012 0610   CO2 26 01/18/2012 1134   CO2 28 01/05/2012 0610   BUN 14.0 01/18/2012 1134   BUN 11 01/05/2012 0610   CREATININE 0.7 01/18/2012 1134   CREATININE 0.62 01/05/2012 0610      Component Value Date/Time   CALCIUM 9.3 01/18/2012 1134   CALCIUM 8.9 01/05/2012 0610   ALKPHOS 106 01/18/2012 1134   AST 13 01/18/2012 1134   ALT 13 01/18/2012 1134   BILITOT 0.20 01/18/2012 1134     ADDITIONAL INFORMATION: 1. CHROMOGENIC IN-SITU HYBRIDIZATION Interpretation HER-2/NEU BY CISH - NO AMPLIFICATION OF HER-2 DETECTED. THE RATIO OF HER-2: CEP 17 SIGNALS WAS 1.16. Reference range: Ratio: HER2:CEP17 < 1.8 - gene amplification not observed Ratio: HER2:CEP 17 1.8-2.2 - equivocal result Ratio: HER2:CEP17 > 2.2 - gene amplification observed Pecola Leisure MD Pathologist, Electronic Signature ( Signed  01/11/2012) 7. PROGNOSTIC INDICATORS - ACIS Results IMMUNOHISTOCHEMICAL AND MORPHOMETRIC ANALYSIS BY THE AUTOMATED CELLULAR IMAGING SYSTEM (ACIS) Estrogen Receptor (Negative, <1%): 100%, STRONG STAINING INTENSITY Progesterone Receptor (Negative, <1%): 100%, STRONG STAINING INTENSITY Proliferation Marker Ki67 by M IB-1 (Low<20%): 17% All controls stained appropriately Pecola Leisure MD Pathologist, Electronic Signature ( Signed 01/10/2012) 1 of 4 FINAL for Ann Maxwell, Ann Maxwell ([id number]) FINAL DIAGNOSIS Diagnosis 1. Lymph node, sentinel, biopsy, Left axilla #1 - ONE BENIGN LYMPH NODE (0/1). 2. Lymph node, sentinel, biopsy, Left axilla #2 - ONE BENIGN LYMPH NODE (0/1). 3. Lymph node, sentinel, biopsy, Left axillary #3 - ONE BENIGN LYMPH NODE (0/1). 4. Lymph node, sentinel, biopsy,  Left axillary #4 - ONE BENIGN LYMPH NODE (0/1). 5. Lymph node, sentinel, biopsy, Left axillary #5 - ONE BENIGN LYMPH NODE (0/1). 6. Lymph node, sentinel, biopsy, Left axillary #6 - ONE BENIGN LYMPH NODE (0/1). 7. Breast, simple mastectomy, Left - INVASIVE DUCTAL CARCINOMA, 0.5 CM. - HIGH GRADE DUCTAL CARCINOMA IN SITU, 9 CM. - MARGINS NOT INVOLVED. - DCIS 0.3 CM FROM ANTERIOR/INFERIOR MARGIN. Microscopic Comment 7. BREAST, INVASIVE TUMOR, WITH LYMPH NODE SAMPLING Specimen, including laterality: Left breast. Procedure: Simple mastectomy. Grade: II Tubule formation: 3 Nuclear pleomorphism: 2 Mitotic:1 Tumor size (glass slide measurement): 0.5 cm Margins: Free or tumor. Invasive, distance to closest margin: 1.8 cm from deep margin. In-situ, distance to closest margin: 0.3 cm from anterior/inferior margin. If margin positive, focally or broadly: N/A Lymphovascular invasion: Not identified. Ductal carcinoma in situ: Present. Grade: High grade Extensive intraductal component: Yes. Lobular neoplasia: Not identified. Tumor focality: Unifocal Treatment effect: No. If present, treatment effect in breast  tissue, lymph nodes or both: N/A Extent of tumor: Skin: Free of tumor. Nipple: Free of tumor. Skeletal muscle: N/A Lymph nodes: # examined: 6 Lymph nodes with metastasis: Isolated tumor cells (< 0.2 mm): 0 Micrometastasis: (> 0.2 mm and < 2.0 mm): 0 Macrometastasis: (> 2.0 mm): 0 Extracapsular extension: N/A Breast prognostic profile: Will be performed on invasive tumor in current specimen. 2 of 4 FINAL for Ann Maxwell, Ann Maxwell ([id number]) Microscopic Comment(continued) Estrogen receptor: 100% strong staining, DCIS. Progesterone receptor: 78%, strong staining, DCIS Her 2 neu: Pending. Ki-67: Pending. Non-neoplastic breast: Fibrocystic changes. TNM: pT1a, pN0, pMX Comments: There is a 9 cm area which is involved by high grade DCIS and there is a 0.5 cm focus of associated invasive carcinoma. The margins of the mastectomy are not involved by invasive or in situ carcinoma and focally DCIS is 0.3 cm from the anterior, inferior soft tissue margin. Breast prognostic profile will be performed on the invasive carcinoma. (JDP:gt, 01/08/12) Jimmy Picket MD Pathologist, Electronic Signature (Case signed 01/08/2012) Specimen Gross and Clinical Information  RADIOGRAPHIC STUDIES:  Dg Chest 2 View  12/29/2011  *RADIOLOGY REPORT*  Clinical Data: Preoperative evaluation.  Hypertension, has been gastroesophageal reflux disease  CHEST - 2 VIEW  Comparison: 02/29/2008  Findings: Heart and mediastinal contours are within normal limits. The lung fields appear clear with no signs of focal infiltrate or congestive failure.  No pleural fluid or significant peribronchial cuffing is seen.  Bony structures are notable for degenerative osteophytosis of the mid and lower thoracic spine.  IMPRESSION: No worrisome focal or acute cardiopulmonary abnormality seen.   Original Report Authenticated By: Bertha Stakes, M.D.    Nm Sentinel Node Inj-no Rpt (breast)  01/04/2012  CLINICAL DATA: left breast cancer    Sulfur colloid was injected intradermally by the nuclear medicine  technologist for breast cancer sentinel node localization.      ASSESSMENT: 71 year old female with  #1 invasive ductal carcinoma of the left breast status post mastectomy. Her final pathology revealed a 0.5 cm invasive ductal carcinoma with associated DCIS that measured 9 cm. All sentinel node lymph nodes were negative for metastatic disease. Tumor was ER +100% PR +100% HER-2/neu negative with Ki-67 of 7%. Postoperatively patient is doing well and is without any significant complaints.  #2 patient is a good candidate for adjuvant antiestrogen therapy consisting of arimdex1 milligram daily. We have discussed risks and benefits of the treatment in detail and literature was given to them. Prescription was sent to her pharmacy.  #3 iron deficiency anemia  I will check iron studies on her   PLAN:   #1 patient will begin arimidex 1 mg daily.  #2 iron studies will be checked and if she needs parenteral iron will go ahead and infuser when the results are back.  #3 patient will be seen back in 3 months time or sooner if need arises.   All questions were answered. The patient knows to call the clinic with any problems, questions or concerns. We can certainly see the patient much sooner if necessary.  I spent 25 minutes counseling the patient face to face. The total time spent in the appointment was 30 minutes.    Drue Second, MD Medical/Oncology [facility] [phone] (beeper) [phone] (Office)  01/18/2012, 4:57 PM

## 2012-01-18 NOTE — Telephone Encounter (Signed)
Gave patient appointment for 03-21-2012 starting at 2:00pm

## 2012-01-18 NOTE — Telephone Encounter (Signed)
Gave patient appointment for bone denisty at the breast center on 01-23-2012 at 2:45pm

## 2012-01-18 NOTE — Patient Instructions (Addendum)
Proceed with arimidex 1 mg daily   Anastrozole tablets What is this medicine? ANASTROZOLE (an AS troe zole) is used to treat breast cancer in women who have gone through menopause. Some types of breast cancer depend on estrogen to grow, and this medicine can stop tumor growth by blocking estrogen production. This medicine may be used for other purposes; ask your health care provider or pharmacist if you have questions. What should I tell my health care provider before I take this medicine? They need to know if you have any of these conditions: -liver disease -an unusual or allergic reaction to anastrozole, other medicines, foods, dyes, or preservatives -pregnant or trying to get pregnant -breast-feeding How should I use this medicine? Take this medicine by mouth with a glass of water. Follow the directions on the prescription label. You can take this medicine with or without food. Take your doses at regular intervals. Do not take your medicine more often than directed. Do not stop taking except on the advice of your doctor or health care professional. Talk to your pediatrician regarding the use of this medicine in children. Special care may be needed. Overdosage: If you think you have taken too much of this medicine contact a poison control center or emergency room at once. NOTE: This medicine is only for you. Do not share this medicine with others. What if I miss a dose? If you miss a dose, take it as soon as you can. If it is almost time for your next dose, take only that dose. Do not take double or extra doses. What may interact with this medicine? Do not take this medicine with any of the following medications: -female hormones, like estrogens or progestins and birth control pills This medicine may also interact with the following medications: -tamoxifen This list may not describe all possible interactions. Give your health care provider a list of all the medicines, herbs,  non-prescription drugs, or dietary supplements you use. Also tell them if you smoke, drink alcohol, or use illegal drugs. Some items may interact with your medicine. What should I watch for while using this medicine? Visit your doctor or health care professional for regular checks on your progress. Let your doctor or health care professional know about any unusual vaginal bleeding. Do not treat yourself for diarrhea, nausea, vomiting or other side effects. Ask your doctor or health care professional for advice. What side effects may I notice from receiving this medicine? Side effects that you should report to your doctor or health care professional as soon as possible: -allergic reactions like skin rash, itching or hives, swelling of the face, lips, or tongue -any new or unusual symptoms -breathing problems -chest pain -leg pain or swelling -vomiting Side effects that usually do not require medical attention (report to your doctor or health care professional if they continue or are bothersome): -back or bone pain -cough, or throat infection -diarrhea or constipation -dizziness -headache -hot flashes -loss of appetite -nausea -sweating -weakness and tiredness -weight gain This list may not describe all possible side effects. Call your doctor for medical advice about side effects. You may report side effects to FDA at 1-800-FDA-1088. Where should I keep my medicine? Keep out of the reach of children. Store at room temperature between 20 and 25 degrees C (68 and 77 degrees F). Throw away any unused medicine after the expiration date. NOTE: This sheet is a summary. It may not cover all possible information. If you have questions about this medicine, talk  to your doctor, pharmacist, or health care provider.  2012, Elsevier/Gold Standard. (06/14/2007 4:31:52 PM)

## 2012-01-23 ENCOUNTER — Ambulatory Visit
Admission: RE | Admit: 2012-01-23 | Discharge: 2012-01-23 | Disposition: A | Discharge status: Home / Self Care | Payer: Medicare Other | Source: Ambulatory Visit | Attending: Oncology | Admitting: Oncology

## 2012-01-23 DIAGNOSIS — C50519 Malignant neoplasm of lower-outer quadrant of unspecified female breast: Secondary | ICD-10-CM

## 2012-01-23 DIAGNOSIS — E559 Vitamin D deficiency, unspecified: Secondary | ICD-10-CM

## 2012-01-24 ENCOUNTER — Telehealth: Payer: Self-pay | Admitting: *Deleted

## 2012-01-24 NOTE — Telephone Encounter (Signed)
Per MD, notified pt Bone Density Scan normal, continue taking Vitamin D and calcium

## 2012-01-24 NOTE — Telephone Encounter (Signed)
Message copied by Cooper Render on Wed Jan 24, 2012  2:19 PM ------      Message from: Victorino December      Created: Wed Jan 24, 2012 10:21 AM       Call patient: bone density scan normal but continue taking vitamin D and calcium

## 2012-02-05 ENCOUNTER — Encounter: Payer: Medicare Other | Admitting: Genetic Counselor

## 2012-02-05 ENCOUNTER — Other Ambulatory Visit: Payer: Medicare Other | Admitting: Lab

## 2012-02-15 ENCOUNTER — Other Ambulatory Visit: Payer: Medicare Other | Admitting: Lab

## 2012-02-15 ENCOUNTER — Ambulatory Visit (HOSPITAL_BASED_OUTPATIENT_CLINIC_OR_DEPARTMENT_OTHER): Payer: Medicare Other | Admitting: Genetic Counselor

## 2012-02-15 ENCOUNTER — Encounter: Payer: Self-pay | Admitting: Genetic Counselor

## 2012-02-15 DIAGNOSIS — Z8049 Family history of malignant neoplasm of other genital organs: Secondary | ICD-10-CM

## 2012-02-15 DIAGNOSIS — Z8543 Personal history of malignant neoplasm of ovary: Secondary | ICD-10-CM

## 2012-02-15 DIAGNOSIS — IMO0002 Reserved for concepts with insufficient information to code with codable children: Secondary | ICD-10-CM

## 2012-02-15 DIAGNOSIS — C50519 Malignant neoplasm of lower-outer quadrant of unspecified female breast: Secondary | ICD-10-CM

## 2012-02-15 NOTE — Progress Notes (Signed)
Dr.  Welton Flakes requested a consultation for genetic counseling and risk assessment for Ann Maxwell, a 71 y.o. female, for discussion of her personal history of breast cancer and family history of thyroid, uterine and ovarian cancer. She presents to clinic today to discuss the possibility of a genetic predisposition to cancer, and to further clarify her risks, as well as her family members' risks for cancer.   HISTORY OF PRESENT ILLNESS: In 2013, at the age of 85, Ann Maxwell was diagnosed with invasive ductal carcinoma of the breast. This was treated with a simple mastectomy and Anastrozole.    Past Medical History  Diagnosis Date  . Asthma   . Hypertension   . Osteoarthritis   . Chest pain   . Palpitations   . Obesity   . Bronchitis   . Environmental allergies   . Vertigo   . Constipation   . Urgency of urination   . Frequency of urination   . Cancer 2013    left breast CA  . Dry skin   . GERD (gastroesophageal reflux disease)     heart burn occasionally    Past Surgical History  Procedure Date  . Tonsillectomy and adenoidectomy   . Tvh and bso 1994  . Tibia fracture surgery 1995    and fibular  with int fx  . Cholecystectomy   . Abdominal hysterectomy   . Lithotripsy   . Lithectomy 2010  . Mastectomy w/ sentinel node biopsy 01/04/2012    Procedure: MASTECTOMY WITH SENTINEL LYMPH NODE BIOPSY;  Surgeon: Almond Lint, MD;  Location: [facility] OR;  Service: General;  Laterality: Left;    History  Substance Use Topics  . Smoking status: Never Smoker   . Smokeless tobacco: Never Used  . Alcohol Use: 0.0 oz/week     occasionally    REPRODUCTIVE HISTORY AND PERSONAL RISK ASSESSMENT FACTORS: Menarche was at age [age] years.   Menopause at 52 Uterus Intact: No Ovaries Intact: No G1P1A0 , first live birth at age 33  She has not previously undergone treatment for infertility.   OCP use for 5 years   She has used HRT in the past.    FAMILY HISTORY:  We obtained a  detailed, 4-generation family history.  Significant diagnoses are listed below: Family History  Problem Relation Age of Onset  . Heart attack Father   . Hypertension Father   . Heart disease Father   . Ovarian cancer Mother 39  . Uterine cancer Maternal Grandmother     late 27s  . Liver cancer Paternal Grandfather   . Thyroid cancer Sister 22  the patient was diagnosed with breast cancer at age 15.  Her sister was diagnosed with thyroid cancer at age 70.  The patint's mother was diagnosed with ovarian cancer at 59.  Her mother had five brothers and one sister, none of whom had cancer.  The patient's maternal grandmother was diagnosed with uterine cancer in her late 51s.  The patient's paternal grandfather was diagnosed with liver cancer and died before the patient was born.  There is no other report of cancer on either side of the family.  Patient's maternal ancestors are of [place] descent, and paternal ancestors are of [place] and [place] descent. There is no reported Ashkenazi Jewish ancestry. There is no  known consanguinity.  GENETIC COUNSELING RISK ASSESSMENT, DISCUSSION, AND SUGGESTED FOLLOW UP: We reviewed the natural history and genetic etiology of sporadic, familial and hereditary cancer syndromes.  About 5-10% of breast  cancer is hereditary.  Of this, about 85% is the result of a BRCA1 or BRCA2 mutation.  We reviewed the red flags of hereditary cancer syndromes and the dominant inheritance patterns. It is unclear if she would meet the general medicare criteria for genetic testing.  We will send blood and have the laboratory preauthorize it to determine the out of pocket costs.  The patient reports that she does not know her mother's side of the family well.  She kept in contact with one cousin, who she thinks may have had cancer, and is unsure if she is still living.  We discussed trying to get more information on this side of the family to determine if additional testing, other than BRCA,  would be needed.  The patient's personal history of breast cancer and family history of ovarian, thyroid and uterine cancer is suggestive of the following possible diagnosis: possible hereditary cancer vs. Sporadic cancer.  We discussed that identification of a hereditary cancer syndrome may help her care providers tailor the patients medical management. If a mutation indicating a hereditary cancer syndrome is detected in this case, the [employer] recommendations would include increased cancer surveillance and possible prophylactic surgery. If a mutation is detected, the patient will be referred back to the referring provider and to any additional appropriate care providers to discuss the relevant options.   If a mutation is not found in the patient, this will decrease the likelihood of a hereditary cancer syndrome as the explanation for her breast cancer. Cancer surveillance options would be discussed for the patient according to the appropriate standard National Comprehensive Cancer Network and American Cancer Society guidelines, with consideration of their personal and family history risk factors. In this case, the patient will be referred back to their care providers for discussions of management.   In order to estimate her chance of having a BRCA mutation, we used statistical models (Penn II) and laboratory data that take into account her personal medical history, family history and ancestry.  Because each model is different, there can be a lot of variability in the risks they give.  Therefore, these numbers must be considered a rough range and not a precise risk of having a BRCA mutation.  These models estimate that she has approximately a 5% chance of having a mutation. Based on this assessment of her family and personal history, genetic testing is recommended.  After considering the risks, benefits, and limitations, the patient provided informed consent for  the following   testing: BRCA1 and BRCA2, plus del/dup through [employer]   Per the patient's request, we will contact her by telephone to discuss these results. A follow up genetic counseling visit will be scheduled if indicated.  The patient was seen for a total of 60 minutes, greater than 50% of which was spent face-to-face counseling.  This plan is being carried out per Dr. Feliz Beam recommendations.  This note will also be sent to the referring provider via the electronic medical record. The patient will be supplied with a summary of this genetic counseling discussion as well as educational information on the discussed hereditary cancer syndromes following the conclusion of their visit.   Patient was discussed with Dr. Drue Second.  _______________________________________________________________________ For Office Staff:  Number of people involved in session: 3 Was an Intern/ student involved with case: no

## 2012-02-20 ENCOUNTER — Ambulatory Visit (INDEPENDENT_AMBULATORY_CARE_PROVIDER_SITE_OTHER): Payer: Medicare Other | Admitting: General Surgery

## 2012-02-20 VITALS — BP 152/90 | HR 72 | Temp 97.0°F | Resp 18 | Ht 64.5 in

## 2012-02-20 DIAGNOSIS — C50519 Malignant neoplasm of lower-outer quadrant of unspecified female breast: Secondary | ICD-10-CM

## 2012-02-20 NOTE — Assessment & Plan Note (Signed)
Pt with still some excess tissue/hematoma in right lateral chest wall.    Pt to do scar massage.    Given scripts for compression sleeve and breast prosthesis  Follow up in 3 months.

## 2012-02-20 NOTE — Patient Instructions (Signed)
Massage left chest wall.  Follow up with me in 3 months.

## 2012-02-20 NOTE — Progress Notes (Signed)
HISTORY: Pt is now around 6-7 weeks s/p left mastectomy for DCIS. She has still some sense of tightness, though her range of motion has improved to normal.  She is not taking any analgesics.  She denies fevers/ chills.      EXAM: General:  Alert and oriented Incision:  Healing well.  Still with soft tissue fullness in upper outer flap.     ASSESSMENT AND PLAN:   left breast DCIS 10 cm Pt with still some excess tissue/hematoma in right lateral chest wall.    Pt to do scar massage.    Given scripts for compression sleeve and breast prosthesis  Follow up in 3 months.      Maudry Diego, MD Surgical Oncology, General & Endocrine Surgery [facility], Colin Benton, MD Halina Maidens., MD

## 2012-02-22 ENCOUNTER — Telehealth: Payer: Self-pay | Admitting: *Deleted

## 2012-02-22 NOTE — Telephone Encounter (Signed)
Pt called with concerns regarding next f/u with MD. Pt states " after I saw Dr. Donell Beers I noticed on my appts it looks like I should have labs, IRON, TIBC, CBC, VIT D and CMET. I just want to make sure if I get these labs so i can let my PCP know so I don't have to get them drawn again when I see her in a few weeks." Will review with MD

## 2012-02-22 NOTE — Telephone Encounter (Signed)
We can get the labs to her PCP

## 2012-03-08 ENCOUNTER — Telehealth (INDEPENDENT_AMBULATORY_CARE_PROVIDER_SITE_OTHER): Payer: Self-pay

## 2012-03-08 ENCOUNTER — Other Ambulatory Visit (INDEPENDENT_AMBULATORY_CARE_PROVIDER_SITE_OTHER): Payer: Self-pay | Admitting: General Surgery

## 2012-03-08 DIAGNOSIS — C50919 Malignant neoplasm of unspecified site of unspecified female breast: Secondary | ICD-10-CM

## 2012-03-08 NOTE — Telephone Encounter (Signed)
Called pt for specific information for referral to PT.

## 2012-03-18 ENCOUNTER — Telehealth: Payer: Self-pay | Admitting: Genetic Counselor

## 2012-03-18 ENCOUNTER — Encounter: Payer: Self-pay | Admitting: Genetic Counselor

## 2012-03-18 NOTE — Telephone Encounter (Signed)
Revealed negative BRCA test results 

## 2012-03-21 ENCOUNTER — Encounter: Payer: Self-pay | Admitting: Adult Health

## 2012-03-21 ENCOUNTER — Ambulatory Visit (HOSPITAL_BASED_OUTPATIENT_CLINIC_OR_DEPARTMENT_OTHER): Payer: Medicare Other | Admitting: Adult Health

## 2012-03-21 ENCOUNTER — Telehealth: Payer: Self-pay | Admitting: Oncology

## 2012-03-21 ENCOUNTER — Ambulatory Visit: Payer: Medicare Other | Attending: General Surgery | Admitting: Physical Therapy

## 2012-03-21 ENCOUNTER — Other Ambulatory Visit (HOSPITAL_BASED_OUTPATIENT_CLINIC_OR_DEPARTMENT_OTHER): Payer: Medicare Other | Admitting: Lab

## 2012-03-21 VITALS — BP 129/77 | HR 99 | Temp 98.4°F | Resp 20 | Ht 64.5 in | Wt 240.7 lb

## 2012-03-21 DIAGNOSIS — C50919 Malignant neoplasm of unspecified site of unspecified female breast: Secondary | ICD-10-CM | POA: Insufficient documentation

## 2012-03-21 DIAGNOSIS — C50519 Malignant neoplasm of lower-outer quadrant of unspecified female breast: Secondary | ICD-10-CM

## 2012-03-21 DIAGNOSIS — M25519 Pain in unspecified shoulder: Secondary | ICD-10-CM | POA: Insufficient documentation

## 2012-03-21 DIAGNOSIS — R293 Abnormal posture: Secondary | ICD-10-CM | POA: Insufficient documentation

## 2012-03-21 DIAGNOSIS — E559 Vitamin D deficiency, unspecified: Secondary | ICD-10-CM

## 2012-03-21 DIAGNOSIS — IMO0001 Reserved for inherently not codable concepts without codable children: Secondary | ICD-10-CM | POA: Insufficient documentation

## 2012-03-21 DIAGNOSIS — D509 Iron deficiency anemia, unspecified: Secondary | ICD-10-CM

## 2012-03-21 DIAGNOSIS — Z170 Estrogen receptor positive status [ER+]: Secondary | ICD-10-CM

## 2012-03-21 DIAGNOSIS — D649 Anemia, unspecified: Secondary | ICD-10-CM

## 2012-03-21 LAB — CBC WITH DIFFERENTIAL/PLATELET
Basophils Absolute: 0.1 10*3/uL (ref 0.0–0.1)
Eosinophils Absolute: 0.3 10*3/uL (ref 0.0–0.5)
HCT: 35.4 % (ref 34.8–46.6)
HGB: 11.2 g/dL — ABNORMAL LOW (ref 11.6–15.9)
MONO#: 0.6 10*3/uL (ref 0.1–0.9)
NEUT#: 6.7 10*3/uL — ABNORMAL HIGH (ref 1.5–6.5)
NEUT%: 74.1 % (ref 38.4–76.8)
RDW: 20.8 % — ABNORMAL HIGH (ref 11.2–14.5)
WBC: 9.1 10*3/uL (ref 3.9–10.3)
lymph#: 1.3 10*3/uL (ref 0.9–3.3)

## 2012-03-21 LAB — COMPREHENSIVE METABOLIC PANEL (CC13)
Albumin: 3.5 g/dL (ref 3.5–5.0)
BUN: 20 mg/dL (ref 7.0–26.0)
CO2: 28 mEq/L (ref 22–29)
Calcium: 9.6 mg/dL (ref 8.4–10.4)
Chloride: 103 mEq/L (ref 98–107)
Creatinine: 0.8 mg/dL (ref 0.6–1.1)
Potassium: 3.8 mEq/L (ref 3.5–5.1)

## 2012-03-21 MED ORDER — ANASTROZOLE 1 MG PO TABS: 1.0000 mg | ORAL_TABLET | Freq: Every day | ORAL | Status: DC

## 2012-03-21 NOTE — Patient Instructions (Addendum)
Doing well, no sign of recurrence.  Continue Arimidex.  We will see you back in 6 months.  Please call us if you have any questions or concerns.

## 2012-03-21 NOTE — Telephone Encounter (Signed)
gve the pt her June 2014 appt calendar 

## 2012-03-21 NOTE — Progress Notes (Signed)
OFFICE PROGRESS NOTE  CC  STALLINGS,SHEILA, MD [address] [place] [address]  DIAGNOSIS: 71 year old female with 0.5 cm invasive ductal carcinoma of the left breast with DCIS status post mastectomy on 01/04/2012  PRIOR THERAPY:  #1 patient was seen in the multidisciplinary breast clinic in early September 2013 for a 10 cm ductal carcinoma in situ. The tumor was ER positive.  #2 patient has gone on to have a left mastectomy on 01/04/2012. Her final pathology revealed a 0.5 cm invasive ductal carcinoma with associated 9 cm DCIS. Sentinel lymph nodes were negative for metastatic disease. The tumor was ER positive PR positive HER-2/neu negative with Ki-67 17%.  #3 patient will now begin antiestrogen therapy with Arimidex 1 mg daily. Risks and benefits of treatment were discussed with the patient. Patient will not need radiation therapy since she has undergone a mastectomy. A total of 5 years of therapy is planned  CURRENT THERAPY: Arimidex 1 mg daily beginning 01/18/2012  INTERVAL HISTORY: Ann Maxwell 71 y.o. female returns for followup visit today.  She is doing very well.  She was started on Arimidex recently and other than hot flashes, she's been tolerating the medication quite well.  She says the hot flashes are tolerable.  She does have some left should swelling and aching that occurred after raking leaves, she's meeting with physical therapy to work on her mobility and lymphedema.  Otherwise she's doing well and w/o questions/concerns.    MEDICAL HISTORY: Past Medical History  Diagnosis Date  . Asthma   . Hypertension   . Osteoarthritis   . Chest pain   . Palpitations   . Obesity   . Bronchitis   . Environmental allergies   . Vertigo   . Constipation   . Urgency of urination   . Frequency of urination   . Cancer 2013    left breast CA  . Dry skin   . GERD (gastroesophageal reflux disease)     heart burn occasionally    ALLERGIES:  is allergic to  terbinafine and related; adhesive; and oxycodone-acetaminophen.  MEDICATIONS:  Current Outpatient Prescriptions  Medication Sig Dispense Refill  . acetaminophen (TYLENOL) 325 MG tablet Take 650 mg by mouth at bedtime.      Marland Kitchen anastrozole (ARIMIDEX) 1 MG tablet Take 1 tablet (1 mg total) by mouth daily.  90 tablet  12  . aspirin EC 81 MG tablet Take 81 mg by mouth daily.      . celecoxib (CELEBREX) 200 MG capsule Take 200 mg by mouth daily as needed.      . CHONDROITIN SULFATE A PO Take 1 tablet by mouth daily.      Marland Kitchen co-enzyme Q-10 30 MG capsule Take 30 mg by mouth daily.      . diphenhydrAMINE (BENADRYL) 25 MG tablet Take 25 mg by mouth at bedtime.      . fish oil-omega-3 fatty acids 1000 MG capsule Take 1 g by mouth daily.      . Fluticasone-Salmeterol (ADVAIR) 250-50 MCG/DOSE AEPB Inhale 1 puff into the lungs 2 (two) times daily.      . hydrochlorothiazide (MICROZIDE) 12.5 MG capsule Take 12.5 mg by mouth daily.      Marland Kitchen HYDROcodone-acetaminophen (NORCO) 5-325 MG per tablet Take 1-2 tablets by mouth every 4 (four) hours as needed for pain.  30 tablet  1  . ipratropium-albuterol (DUONEB) 0.5-2.5 (3) MG/3ML SOLN Take 3 mLs by nebulization every 6 (six) hours as needed. For wheezing      .  loratadine (CLARITIN) 10 MG tablet Take 10 mg by mouth daily as needed. For allergies      . losartan-hydrochlorothiazide (HYZAAR) 50-12.5 MG per tablet Take 1 tablet by mouth daily.      . montelukast (SINGULAIR) 10 MG tablet Take 10 mg by mouth at bedtime.        SURGICAL HISTORY:  Past Surgical History  Procedure Date  . Tonsillectomy and adenoidectomy   . Tvh and bso 1994  . Tibia fracture surgery 1995    and fibular  with int fx  . Cholecystectomy   . Abdominal hysterectomy   . Lithotripsy   . Lithectomy 2010  . Mastectomy w/ sentinel node biopsy 01/04/2012    Procedure: MASTECTOMY WITH SENTINEL LYMPH NODE BIOPSY;  Surgeon: Almond Lint, MD;  Location: [facility] OR;  Service: General;  Laterality: Left;     REVIEW OF SYSTEMS:   General: fatigue (-), night sweats (-), fever (-), pain (-) Lymph: palpable nodes (-) HEENT: vision changes (-), mucositis (-), gum bleeding (-), epistaxis (-) Cardiovascular: chest pain (-), palpitations (-) Pulmonary: shortness of breath (-), dyspnea on exertion (-), cough (-), hemoptysis (-) GI:  Early satiety (-), melena (-), dysphagia (-), nausea/vomiting (-), diarrhea (-) GU: dysuria (-), hematuria (-), incontinence (-) Musculoskeletal: joint swelling (-), joint pain (-), back pain (-) Neuro: weakness (-), numbness (-), headache (-), confusion (-) Skin: Rash (-), lesions (-), dryness (-) Psych: depression (-), suicidal/homicidal ideation (-), feeling of hopelessness (-)  Health Maintenance Mammogram: 11/2011 Colonoscopy:3-4 years ago, due in 1-2 years Bone Density Scan: 10/13  Pap Smear: s/p TAH/BSO Eye Exam: 1/13 Vitamin D Level: pending Lipid Panel:06/13  PHYSICAL EXAMINATION: Blood pressure 129/77, pulse 99, temperature 98.4 F (36.9 C), temperature source Oral, resp. rate 20, height 5' 4.5" (1.638 m), weight 240 lb 11.2 oz (109.181 kg). Body mass index is 40.68 kg/(m^2). General: Patient is a well appearing female in no acute distress HEENT: PERRLA, sclerae anicteric no conjunctival pallor, MMM Neck: supple, no palpable adenopathy Lungs: clear to auscultation bilaterally, no wheezes, rhonchi, or rales Cardiovascular: regular rate rhythm, S1, S2, no murmurs, rubs or gallops Abdomen: Soft, non-tender, non-distended, normoactive bowel sounds, no HSM Extremities: warm and well perfused, no clubbing, cyanosis, or edema Skin: No rashes or lesions Neuro: Non-focal ECOG PERFORMANCE STATUS: 0 - Asymptomatic Left mastectomy scar is well healed no nodularity no tenderness. Right breast no masses or nipple discharge.     LABORATORY DATA: Lab Results  Component Value Date   WBC 9.1 03/21/2012   HGB 11.2* 03/21/2012   HCT 35.4 03/21/2012   MCV 73.6*  03/21/2012   PLT 309 03/21/2012      Chemistry      Component Value Date/Time   NA 139 01/18/2012 1134   NA 140 01/05/2012 0610   K 3.1* 01/18/2012 1134   K 3.4* 01/05/2012 0610   CL 103 01/18/2012 1134   CL 103 01/05/2012 0610   CO2 26 01/18/2012 1134   CO2 28 01/05/2012 0610   BUN 14.0 01/18/2012 1134   BUN 11 01/05/2012 0610   CREATININE 0.7 01/18/2012 1134   CREATININE 0.62 01/05/2012 0610      Component Value Date/Time   CALCIUM 9.3 01/18/2012 1134   CALCIUM 8.9 01/05/2012 0610   ALKPHOS 106 01/18/2012 1134   AST 13 01/18/2012 1134   ALT 13 01/18/2012 1134   BILITOT 0.20 01/18/2012 1134     ADDITIONAL INFORMATION: 1. CHROMOGENIC IN-SITU HYBRIDIZATION Interpretation HER-2/NEU BY CISH - NO AMPLIFICATION OF  HER-2 DETECTED. THE RATIO OF HER-2: CEP 17 SIGNALS WAS 1.16. Reference range: Ratio: HER2:CEP17 < 1.8 - gene amplification not observed Ratio: HER2:CEP 17 1.8-2.2 - equivocal result Ratio: HER2:CEP17 > 2.2 - gene amplification observed Pecola Leisure MD Pathologist, Electronic Signature ( Signed 01/11/2012) 7. PROGNOSTIC INDICATORS - ACIS Results IMMUNOHISTOCHEMICAL AND MORPHOMETRIC ANALYSIS BY THE AUTOMATED CELLULAR IMAGING SYSTEM (ACIS) Estrogen Receptor (Negative, <1%): 100%, STRONG STAINING INTENSITY Progesterone Receptor (Negative, <1%): 100%, STRONG STAINING INTENSITY Proliferation Marker Ki67 by M IB-1 (Low<20%): 17% All controls stained appropriately Pecola Leisure MD Pathologist, Electronic Signature ( Signed 01/10/2012) 1 of 4 FINAL for Ann Maxwell, Ann Maxwell ([id number]) FINAL DIAGNOSIS Diagnosis 1. Lymph node, sentinel, biopsy, Left axilla #1 - ONE BENIGN LYMPH NODE (0/1). 2. Lymph node, sentinel, biopsy, Left axilla #2 - ONE BENIGN LYMPH NODE (0/1). 3. Lymph node, sentinel, biopsy, Left axillary #3 - ONE BENIGN LYMPH NODE (0/1). 4. Lymph node, sentinel, biopsy, Left axillary #4 - ONE BENIGN LYMPH NODE (0/1). 5. Lymph node, sentinel, biopsy, Left axillary #5 - ONE  BENIGN LYMPH NODE (0/1). 6. Lymph node, sentinel, biopsy, Left axillary #6 - ONE BENIGN LYMPH NODE (0/1). 7. Breast, simple mastectomy, Left - INVASIVE DUCTAL CARCINOMA, 0.5 CM. - HIGH GRADE DUCTAL CARCINOMA IN SITU, 9 CM. - MARGINS NOT INVOLVED. - DCIS 0.3 CM FROM ANTERIOR/INFERIOR MARGIN. Microscopic Comment 7. BREAST, INVASIVE TUMOR, WITH LYMPH NODE SAMPLING Specimen, including laterality: Left breast. Procedure: Simple mastectomy. Grade: II Tubule formation: 3 Nuclear pleomorphism: 2 Mitotic:1 Tumor size (glass slide measurement): 0.5 cm Margins: Free or tumor. Invasive, distance to closest margin: 1.8 cm from deep margin. In-situ, distance to closest margin: 0.3 cm from anterior/inferior margin. If margin positive, focally or broadly: N/A Lymphovascular invasion: Not identified. Ductal carcinoma in situ: Present. Grade: High grade Extensive intraductal component: Yes. Lobular neoplasia: Not identified. Tumor focality: Unifocal Treatment effect: No. If present, treatment effect in breast tissue, lymph nodes or both: N/A Extent of tumor: Skin: Free of tumor. Nipple: Free of tumor. Skeletal muscle: N/A Lymph nodes: # examined: 6 Lymph nodes with metastasis: Isolated tumor cells (< 0.2 mm): 0 Micrometastasis: (> 0.2 mm and < 2.0 mm): 0 Macrometastasis: (> 2.0 mm): 0 Extracapsular extension: N/A Breast prognostic profile: Will be performed on invasive tumor in current specimen. 2 of 4 FINAL for KORY, PANJWANI ([id number]) Microscopic Comment(continued) Estrogen receptor: 100% strong staining, DCIS. Progesterone receptor: 78%, strong staining, DCIS Her 2 neu: Pending. Ki-67: Pending. Non-neoplastic breast: Fibrocystic changes. TNM: pT1a, pN0, pMX Comments: There is a 9 cm area which is involved by high grade DCIS and there is a 0.5 cm focus of associated invasive carcinoma. The margins of the mastectomy are not involved by invasive or in situ carcinoma and focally  DCIS is 0.3 cm from the anterior, inferior soft tissue margin. Breast prognostic profile will be performed on the invasive carcinoma. (JDP:gt, 01/08/12) Jimmy Picket MD Pathologist, Electronic Signature (Case signed 01/08/2012) Specimen Gross and Clinical Information  RADIOGRAPHIC STUDIES:  Dg Chest 2 View  12/29/2011  *RADIOLOGY REPORT*  Clinical Data: Preoperative evaluation.  Hypertension, has been gastroesophageal reflux disease  CHEST - 2 VIEW  Comparison: 02/29/2008  Findings: Heart and mediastinal contours are within normal limits. The lung fields appear clear with no signs of focal infiltrate or congestive failure.  No pleural fluid or significant peribronchial cuffing is seen.  Bony structures are notable for degenerative osteophytosis of the mid and lower thoracic spine.  IMPRESSION: No worrisome focal or acute cardiopulmonary abnormality seen.  Original Report Authenticated By: Bertha Stakes, M.D.    Nm Sentinel Node Inj-no Rpt (breast)  01/04/2012  CLINICAL DATA: left breast cancer   Sulfur colloid was injected intradermally by the nuclear medicine  technologist for breast cancer sentinel node localization.      ASSESSMENT: 71 year old female with  #1 invasive ductal carcinoma of the left breast status post mastectomy. Her final pathology revealed a 0.5 cm invasive ductal carcinoma with associated DCIS that measured 9 cm. All sentinel node lymph nodes were negative for metastatic disease. Tumor was ER +100% PR +100% HER-2/neu negative with Ki-67 of 7%. Postoperatively patient is doing well and is without any significant complaints.  #2 patient is a good candidate for adjuvant antiestrogen therapy consisting of arimdex1 milligram daily. We have discussed risks and benefits of the treatment in detail and literature was given to them. Prescription was sent to her pharmacy.  #3 iron deficiency anemia I will check iron studies on her   PLAN:   #1 Ms. Underwood is tolerating her  Arimidex well.  She will continue this.   #2 iron studies will be checked and if she needs parenteral iron will go ahead and infuser when the results are back.  #3 patient will be seen back in 6 months time or sooner if need arises.  All questions were answered. The patient knows to call the clinic with any problems, questions or concerns. We can certainly see the patient much sooner if necessary.  I spent 25 minutes counseling the patient face to face. The total time spent in the appointment was 30 minutes.  This case was reviewed with Dr. Welton Flakes.  Cherie Ouch Lyn Hollingshead, NP Medical Oncology [facility] Phone: [phone] 03/21/2012, 2:37 PM

## 2012-03-22 LAB — VITAMIN D 25 HYDROXY (VIT D DEFICIENCY, FRACTURES): Vit D, 25-Hydroxy: 43 ng/mL (ref 30–89)

## 2012-03-22 LAB — FERRITIN: Ferritin: 9 ng/mL — ABNORMAL LOW (ref 10–291)

## 2012-03-22 LAB — IRON AND TIBC: %SAT: 7 % — ABNORMAL LOW (ref 20–55)

## 2012-03-27 ENCOUNTER — Ambulatory Visit: Payer: Medicare Other

## 2012-03-29 ENCOUNTER — Ambulatory Visit: Payer: Medicare Other | Admitting: Physical Therapy

## 2012-04-01 ENCOUNTER — Ambulatory Visit: Payer: Medicare Other

## 2012-04-04 ENCOUNTER — Ambulatory Visit: Payer: Medicare Other

## 2012-04-08 ENCOUNTER — Ambulatory Visit: Payer: Medicare Other

## 2012-04-12 ENCOUNTER — Encounter: Payer: Self-pay | Admitting: Internal Medicine

## 2012-04-15 ENCOUNTER — Ambulatory Visit: Payer: Medicare Other | Admitting: Physical Therapy

## 2012-04-18 ENCOUNTER — Ambulatory Visit: Payer: Medicare PPO | Attending: General Surgery | Admitting: Physical Therapy

## 2012-04-18 DIAGNOSIS — C50919 Malignant neoplasm of unspecified site of unspecified female breast: Secondary | ICD-10-CM | POA: Insufficient documentation

## 2012-04-18 DIAGNOSIS — M25519 Pain in unspecified shoulder: Secondary | ICD-10-CM | POA: Insufficient documentation

## 2012-04-18 DIAGNOSIS — R293 Abnormal posture: Secondary | ICD-10-CM | POA: Insufficient documentation

## 2012-04-18 DIAGNOSIS — IMO0001 Reserved for inherently not codable concepts without codable children: Secondary | ICD-10-CM | POA: Insufficient documentation

## 2012-04-22 ENCOUNTER — Ambulatory Visit: Payer: Medicare PPO | Admitting: Physical Therapy

## 2012-04-24 ENCOUNTER — Encounter: Payer: Self-pay | Admitting: Internal Medicine

## 2012-04-24 ENCOUNTER — Ambulatory Visit: Payer: Medicare PPO | Admitting: Physical Therapy

## 2012-04-29 ENCOUNTER — Ambulatory Visit: Payer: Medicare PPO

## 2012-05-01 ENCOUNTER — Ambulatory Visit: Payer: Medicare PPO

## 2012-05-06 ENCOUNTER — Telehealth (INDEPENDENT_AMBULATORY_CARE_PROVIDER_SITE_OTHER): Payer: Self-pay | Admitting: General Surgery

## 2012-05-06 ENCOUNTER — Ambulatory Visit: Payer: Medicare PPO

## 2012-05-06 ENCOUNTER — Other Ambulatory Visit (INDEPENDENT_AMBULATORY_CARE_PROVIDER_SITE_OTHER): Payer: Self-pay | Admitting: General Surgery

## 2012-05-06 DIAGNOSIS — C50919 Malignant neoplasm of unspecified site of unspecified female breast: Secondary | ICD-10-CM

## 2012-05-06 NOTE — Telephone Encounter (Signed)
Pt is requesting another PT referral for her R shoulder.  L shoulder previously evaluated and much improved, but now R shoulder is causing pain and weakness.  Order entered in Epic and form faxed to [facility].

## 2012-05-08 ENCOUNTER — Ambulatory Visit: Payer: Medicare PPO | Admitting: Physical Therapy

## 2012-05-13 ENCOUNTER — Ambulatory Visit: Payer: Medicare PPO

## 2012-05-15 ENCOUNTER — Ambulatory Visit: Payer: Medicare PPO

## 2012-05-15 ENCOUNTER — Ambulatory Visit (AMBULATORY_SURGERY_CENTER): Payer: 59 | Admitting: *Deleted

## 2012-05-15 ENCOUNTER — Ambulatory Visit: Payer: Medicare PPO | Admitting: Physical Therapy

## 2012-05-15 VITALS — Ht 64.5 in | Wt 241.4 lb

## 2012-05-15 DIAGNOSIS — Z1211 Encounter for screening for malignant neoplasm of colon: Secondary | ICD-10-CM

## 2012-05-15 MED ORDER — MOVIPREP 100 G PO SOLR: ORAL | Status: DC

## 2012-05-20 ENCOUNTER — Ambulatory Visit: Payer: Medicare PPO | Attending: General Surgery | Admitting: Physical Therapy

## 2012-05-20 DIAGNOSIS — IMO0001 Reserved for inherently not codable concepts without codable children: Secondary | ICD-10-CM | POA: Insufficient documentation

## 2012-05-20 DIAGNOSIS — R293 Abnormal posture: Secondary | ICD-10-CM | POA: Insufficient documentation

## 2012-05-20 DIAGNOSIS — M25519 Pain in unspecified shoulder: Secondary | ICD-10-CM | POA: Insufficient documentation

## 2012-05-20 DIAGNOSIS — C50919 Malignant neoplasm of unspecified site of unspecified female breast: Secondary | ICD-10-CM | POA: Insufficient documentation

## 2012-05-22 ENCOUNTER — Ambulatory Visit: Payer: Medicare PPO | Admitting: Physical Therapy

## 2012-05-27 ENCOUNTER — Ambulatory Visit: Payer: Medicare PPO | Admitting: Physical Therapy

## 2012-05-27 ENCOUNTER — Telehealth: Payer: Self-pay | Admitting: Internal Medicine

## 2012-05-27 NOTE — Telephone Encounter (Signed)
Patient concerned about weather. Rescheduled patient on 06/19/12 at 12:30/1:30 PM at [facility] for recall colonoscopy.

## 2012-05-29 ENCOUNTER — Encounter: Payer: 59 | Admitting: Internal Medicine

## 2012-05-30 ENCOUNTER — Ambulatory Visit: Payer: Medicare PPO | Admitting: Physical Therapy

## 2012-06-01 ENCOUNTER — Other Ambulatory Visit: Payer: Self-pay

## 2012-06-03 ENCOUNTER — Ambulatory Visit: Payer: Medicare PPO

## 2012-06-04 ENCOUNTER — Ambulatory Visit (INDEPENDENT_AMBULATORY_CARE_PROVIDER_SITE_OTHER): Payer: Medicare PPO | Admitting: General Surgery

## 2012-06-04 VITALS — BP 142/78 | HR 76 | Temp 98.0°F | Resp 18 | Ht 64.0 in | Wt 237.0 lb

## 2012-06-04 DIAGNOSIS — C50519 Malignant neoplasm of lower-outer quadrant of unspecified female breast: Secondary | ICD-10-CM

## 2012-06-04 NOTE — Assessment & Plan Note (Signed)
Patient continues on Arimedex for left breast DCIS. She is currently without clinical evidence of disease.  She will be due for her mammogram this summer. She has an appointment in 3 months with oncology. I will see her back in 6 months.  She is continuing with physical therapy for her shoulder pain. I offered her referral to orthopedic surgeon, that she is content with continuing PT for now.

## 2012-06-04 NOTE — Patient Instructions (Signed)
Follow up with me in 6 months.  If mammogram has not already been done at that time, we will set it up.

## 2012-06-04 NOTE — Progress Notes (Signed)
HISTORY: Patient is here for a three-month followup after her mastectomy in September of 2013. She had a 10 cm area of DCIS that would not have been amenable to breast conservation. She had a hematoma in the upper outer brest and axilla at her last visit.  We referred her to physical therapy for shoulder pain. She has improved the mobility of her left shoulder significantly. She had pre-existing shoulder issues from arthritis. Her right shoulder has been acting up as well and she is working with physical therapy for this. She denies any side effects from the Arimedex that she is aware of.   PERTINENT REVIEW OF SYSTEMS: Arthritis pain.     EXAM: Head: Normocephalic and atraumatic.  Eyes:  Conjunctivae are normal. Pupils are equal, round, and reactive to light. No scleral icterus.  Neck:  Normal range of motion. Neck supple. No tracheal deviation present. No thyromegaly present.  Resp: No respiratory distress, normal effort. Abd:  Abdomen is soft, non distended and non tender. No masses are palpable.  There is no rebound and no guarding.  Neurological: Alert and oriented to person, place, and time. Coordination normal.  Skin: Skin is warm and dry. No rash noted. No diaphoretic. No erythema. No pallor.  Psychiatric: Normal mood and affect. Normal behavior. Judgment and thought content normal.      ASSESSMENT AND PLAN:   left breast DCIS 10 cm Patient continues on Arimedex for left breast DCIS. She is currently without clinical evidence of disease.  She will be due for her mammogram this summer. She has an appointment in 3 months with oncology. I will see her back in 6 months.  She is continuing with physical therapy for her shoulder pain. I offered her referral to orthopedic surgeon, that she is content with continuing PT for now.    Maudry Diego, MD Surgical Oncology, General & Endocrine Surgery [facility] Surgery, Colin Benton, MD Halina Maidens., MD

## 2012-06-05 ENCOUNTER — Ambulatory Visit: Payer: Medicare PPO | Admitting: Physical Therapy

## 2012-06-10 ENCOUNTER — Ambulatory Visit: Payer: Medicare PPO | Admitting: Physical Therapy

## 2012-06-11 ENCOUNTER — Ambulatory Visit: Payer: Medicare PPO | Admitting: *Deleted

## 2012-06-18 ENCOUNTER — Ambulatory Visit: Payer: Medicare PPO

## 2012-06-18 ENCOUNTER — Ambulatory Visit: Payer: Medicare PPO | Attending: General Surgery

## 2012-06-18 DIAGNOSIS — M25519 Pain in unspecified shoulder: Secondary | ICD-10-CM | POA: Insufficient documentation

## 2012-06-18 DIAGNOSIS — R293 Abnormal posture: Secondary | ICD-10-CM | POA: Insufficient documentation

## 2012-06-18 DIAGNOSIS — IMO0001 Reserved for inherently not codable concepts without codable children: Secondary | ICD-10-CM | POA: Insufficient documentation

## 2012-06-18 DIAGNOSIS — C50919 Malignant neoplasm of unspecified site of unspecified female breast: Secondary | ICD-10-CM | POA: Insufficient documentation

## 2012-06-19 ENCOUNTER — Encounter: Payer: Self-pay | Admitting: Internal Medicine

## 2012-06-19 ENCOUNTER — Ambulatory Visit (AMBULATORY_SURGERY_CENTER): Payer: Medicare Other | Admitting: Internal Medicine

## 2012-06-19 VITALS — BP 113/78 | HR 86 | Temp 99.8°F | Resp 20 | Ht 64.0 in | Wt 241.0 lb

## 2012-06-19 DIAGNOSIS — Z1211 Encounter for screening for malignant neoplasm of colon: Secondary | ICD-10-CM

## 2012-06-19 DIAGNOSIS — D1330 Benign neoplasm of unspecified part of small intestine: Secondary | ICD-10-CM

## 2012-06-19 DIAGNOSIS — D126 Benign neoplasm of colon, unspecified: Secondary | ICD-10-CM

## 2012-06-19 DIAGNOSIS — K633 Ulcer of intestine: Secondary | ICD-10-CM

## 2012-06-19 DIAGNOSIS — Z86010 Personal history of colonic polyps: Secondary | ICD-10-CM

## 2012-06-19 MED ORDER — SODIUM CHLORIDE 0.9 % IV SOLN: 500.0000 mL | INTRAVENOUS | Status: DC

## 2012-06-19 NOTE — Progress Notes (Signed)
Report to pacu rn, vss, bbs=clear 

## 2012-06-19 NOTE — Progress Notes (Signed)
Called to room to assist during endoscopic procedure.  Patient ID and intended procedure confirmed with present staff. Received instructions for my participation in the procedure from the performing physician. ewm 

## 2012-06-19 NOTE — Progress Notes (Signed)
Patient did not experience any of the following events: a burn prior to discharge; a fall within the facility; wrong site/side/patient/procedure/implant event; or a hospital transfer or hospital admission upon discharge from the facility. (G8907) Patient did not have preoperative order for IV antibiotic SSI prophylaxis. ([id number])  

## 2012-06-19 NOTE — Patient Instructions (Addendum)

## 2012-06-19 NOTE — Progress Notes (Addendum)
Initial temp 100.4, rechecked with 99.8 and at 1311 temp 99.4. Informed CRNA , Renae Fickle Patient denies any symptoms of upper respiratory or urinary symptoms.

## 2012-06-19 NOTE — Op Note (Signed)
[facility] [address] [place], [address]   COLONOSCOPY PROCEDURE REPORT  PATIENT: [name], [name].  MR#: [id number] BIRTHDATE: 10-20-1940 , 71  yrs. old GENDER: Female ENDOSCOPIST: Hart Carwin, MD REFERRED BY:  Halina Maidens, M.D. PROCEDURE DATE:  06/19/2012 PROCEDURE:   Colonoscopy with biopsy ASA CLASS:   Class II INDICATIONS:Patient's personal history of adenomatous colon polyps.,02/2007 and 2005 MEDICATIONS: MAC sedation, administered by CRNA and Propofol (Diprivan) 270 mg IV  DESCRIPTION OF PROCEDURE:   After the risks and benefits and of the procedure were explained, informed consent was obtained.  A digital rectal exam revealed no abnormalities of the rectum.    The LB [id number]  endoscope was introduced through the anus and advanced to the terminal ileum which was identified by both the appendix and ileocecal valve .  The quality of the prep was good, using MoviPrep .  The instrument was then slowly withdrawn as the colon was fully examined.     COLON FINDINGS: Abnormal mucosa was found at the ileocecal valve. The mucosa was bleeding and ulcerated.  the valve was soft and pliable, no dedfinite tumor seen, Terminal ileum was normal A biopsy of the area was performed using cold forceps. Retroflexed views revealed no abnormalities.     The scope was then withdrawn from the patient and the procedure completed.  COMPLICATIONS: There were no complications. ENDOSCOPIC IMPRESSION: Abnormal mucosa was found at the ileocecal valve; biopsy of the area was performed using cold forceps , possible scope induced trauma to the valve, biopsies taken Normal TI small internal hemorrhoids  RECOMMENDATIONS: 1.  Await pathology results 2.  High fiber diet   REPEAT EXAM: for Colonoscopy, pending biopsy results.  cc:  _______________________________ eSignedHart Carwin, MD 06/19/2012 2:09 PM     PATIENT NAME:  Ann Maxwell, Ann Maxwell. MR#: [id number]

## 2012-06-20 ENCOUNTER — Encounter: Payer: Medicare PPO | Admitting: Physical Therapy

## 2012-06-20 ENCOUNTER — Telehealth: Payer: Self-pay | Admitting: *Deleted

## 2012-06-20 NOTE — Telephone Encounter (Signed)
  Follow up Call-  Call back number 06/19/2012  Post procedure Call Back phone  # [phone]  Permission to leave phone message Yes     Patient questions:  Do you have a fever, pain , or abdominal swelling? no Pain Score  0 *  Have you tolerated food without any problems? yes  Have you been able to return to your normal activities? yes  Do you have any questions about your discharge instructions: Diet   no Medications  no Follow up visit  no  Do you have questions or concerns about your Care? no  Actions: * If pain score is 4 or above: No action needed, pain <4.

## 2012-06-25 ENCOUNTER — Encounter: Payer: Self-pay | Admitting: Internal Medicine

## 2012-06-27 ENCOUNTER — Encounter: Payer: Medicare PPO | Admitting: Physical Therapy

## 2012-07-11 ENCOUNTER — Encounter: Payer: Medicare PPO | Admitting: Physical Therapy

## 2012-09-11 ENCOUNTER — Inpatient Hospital Stay (HOSPITAL_COMMUNITY)
Admission: EM | Admit: 2012-09-11 | Discharge: 2012-09-13 | DRG: 812 | Disposition: A | Discharge status: Home / Self Care | Payer: Medicare PPO | Attending: Family Medicine | Admitting: Family Medicine

## 2012-09-11 ENCOUNTER — Encounter (HOSPITAL_COMMUNITY): Payer: Self-pay | Admitting: Emergency Medicine

## 2012-09-11 DIAGNOSIS — R0789 Other chest pain: Secondary | ICD-10-CM | POA: Diagnosis present

## 2012-09-11 DIAGNOSIS — I440 Atrioventricular block, first degree: Secondary | ICD-10-CM | POA: Diagnosis present

## 2012-09-11 DIAGNOSIS — D509 Iron deficiency anemia, unspecified: Principal | ICD-10-CM | POA: Diagnosis present

## 2012-09-11 DIAGNOSIS — C50519 Malignant neoplasm of lower-outer quadrant of unspecified female breast: Secondary | ICD-10-CM

## 2012-09-11 DIAGNOSIS — Z8249 Family history of ischemic heart disease and other diseases of the circulatory system: Secondary | ICD-10-CM

## 2012-09-11 DIAGNOSIS — Z853 Personal history of malignant neoplasm of breast: Secondary | ICD-10-CM

## 2012-09-11 DIAGNOSIS — E669 Obesity, unspecified: Secondary | ICD-10-CM | POA: Diagnosis present

## 2012-09-11 DIAGNOSIS — I10 Essential (primary) hypertension: Secondary | ICD-10-CM | POA: Diagnosis present

## 2012-09-11 DIAGNOSIS — C50512 Malignant neoplasm of lower-outer quadrant of left female breast: Secondary | ICD-10-CM

## 2012-09-11 DIAGNOSIS — J45909 Unspecified asthma, uncomplicated: Secondary | ICD-10-CM | POA: Diagnosis present

## 2012-09-11 DIAGNOSIS — M1990 Unspecified osteoarthritis, unspecified site: Secondary | ICD-10-CM | POA: Diagnosis present

## 2012-09-11 DIAGNOSIS — Z9221 Personal history of antineoplastic chemotherapy: Secondary | ICD-10-CM

## 2012-09-11 DIAGNOSIS — Z79899 Other long term (current) drug therapy: Secondary | ICD-10-CM

## 2012-09-11 DIAGNOSIS — E876 Hypokalemia: Secondary | ICD-10-CM | POA: Diagnosis present

## 2012-09-11 DIAGNOSIS — Z7982 Long term (current) use of aspirin: Secondary | ICD-10-CM

## 2012-09-11 DIAGNOSIS — I441 Atrioventricular block, second degree: Secondary | ICD-10-CM

## 2012-09-11 DIAGNOSIS — Z9010 Acquired absence of unspecified breast and nipple: Secondary | ICD-10-CM

## 2012-09-11 DIAGNOSIS — D649 Anemia, unspecified: Secondary | ICD-10-CM

## 2012-09-11 DIAGNOSIS — K219 Gastro-esophageal reflux disease without esophagitis: Secondary | ICD-10-CM | POA: Diagnosis present

## 2012-09-11 DIAGNOSIS — Z6839 Body mass index (BMI) 39.0-39.9, adult: Secondary | ICD-10-CM

## 2012-09-11 DIAGNOSIS — R000 Tachycardia, unspecified: Secondary | ICD-10-CM

## 2012-09-11 DIAGNOSIS — R079 Chest pain, unspecified: Secondary | ICD-10-CM

## 2012-09-11 HISTORY — DX: Malignant neoplasm of unspecified site of unspecified female breast: C50.919

## 2012-09-11 LAB — COMPREHENSIVE METABOLIC PANEL
Alkaline Phosphatase: 106 U/L (ref 39–117)
BUN: 13 mg/dL (ref 6–23)
Calcium: 9.6 mg/dL (ref 8.4–10.5)
GFR calc Af Amer: >90 mL/min (ref >90)
Glucose, Bld: 99 mg/dL (ref 70–99)
Total Protein: 7 g/dL (ref 6.0–8.3)

## 2012-09-11 LAB — FOLATE: Folate: >20 ng/mL

## 2012-09-11 LAB — CBC WITH DIFFERENTIAL/PLATELET
Basophils Relative: 0 % (ref 0–1)
Eosinophils Absolute: 0.3 10*3/uL (ref 0.0–0.7)
HCT: 26.2 % — ABNORMAL LOW (ref 36.0–46.0)
Hemoglobin: 7.4 g/dL — ABNORMAL LOW (ref 12.0–15.0)
MCH: 18 pg — ABNORMAL LOW (ref 26.0–34.0)
MCHC: 28.2 g/dL — ABNORMAL LOW (ref 30.0–36.0)
Monocytes Absolute: 0.7 10*3/uL (ref 0.1–1.0)
Neutro Abs: 6.8 10*3/uL (ref 1.7–7.7)

## 2012-09-11 LAB — RETICULOCYTES: RBC.: 3.83 MIL/uL — ABNORMAL LOW (ref 3.87–5.11)

## 2012-09-11 MED ORDER — SODIUM CHLORIDE 0.9 % IV BOLUS (SEPSIS)
1000.0000 mL | Freq: Once | INTRAVENOUS | Status: AC
Start: 2012-09-11 — End: 2012-09-11
  Administered 2012-09-11: 1000 mL via INTRAVENOUS

## 2012-09-11 MED ORDER — ANASTROZOLE 1 MG PO TABS
1.0000 mg | ORAL_TABLET | Freq: Every day | ORAL | Status: DC
  Administered 2012-09-11 – 2012-09-13 (×3): 1 mg via ORAL
  Filled 2012-09-11 (×3): qty 1

## 2012-09-11 MED ORDER — OMEGA-3 FATTY ACIDS 1000 MG PO CAPS: 1.0000 g | ORAL_CAPSULE | Freq: Every day | ORAL | Status: DC

## 2012-09-11 MED ORDER — CELECOXIB 200 MG PO CAPS
200.0000 mg | ORAL_CAPSULE | Freq: Every day | ORAL | Status: DC | PRN
  Filled 2012-09-11: qty 1

## 2012-09-11 MED ORDER — OMEGA-3-ACID ETHYL ESTERS 1 G PO CAPS
1.0000 g | ORAL_CAPSULE | Freq: Every day | ORAL | Status: DC
  Administered 2012-09-11 – 2012-09-13 (×3): 1 g via ORAL
  Filled 2012-09-11 (×3): qty 1

## 2012-09-11 MED ORDER — SODIUM CHLORIDE 0.9 % IV BOLUS (SEPSIS): 1000.0000 mL | Freq: Once | INTRAVENOUS | Status: DC

## 2012-09-11 MED ORDER — ALBUTEROL SULFATE (5 MG/ML) 0.5% IN NEBU: 2.5000 mg | INHALATION_SOLUTION | Freq: Four times a day (QID) | RESPIRATORY_TRACT | Status: DC | PRN

## 2012-09-11 MED ORDER — FERROUS SULFATE 325 (65 FE) MG PO TABS
325.0000 mg | ORAL_TABLET | Freq: Two times a day (BID) | ORAL | Status: DC
  Administered 2012-09-11 – 2012-09-13 (×4): 325 mg via ORAL
  Filled 2012-09-11 (×6): qty 1

## 2012-09-11 MED ORDER — SODIUM CHLORIDE 0.9 % IV SOLN: INTRAVENOUS | Status: AC

## 2012-09-11 MED ORDER — HYDROCHLOROTHIAZIDE 12.5 MG PO CAPS
12.5000 mg | ORAL_CAPSULE | Freq: Every day | ORAL | Status: DC
  Filled 2012-09-11: qty 1

## 2012-09-11 MED ORDER — ONDANSETRON HCL 4 MG/2ML IJ SOLN: 4.0000 mg | Freq: Four times a day (QID) | INTRAMUSCULAR | Status: DC | PRN

## 2012-09-11 MED ORDER — ONDANSETRON HCL 4 MG PO TABS: 4.0000 mg | ORAL_TABLET | Freq: Four times a day (QID) | ORAL | Status: DC | PRN

## 2012-09-11 MED ORDER — ENOXAPARIN SODIUM 40 MG/0.4ML ~~LOC~~ SOLN
40.0000 mg | SUBCUTANEOUS | Status: DC
  Administered 2012-09-11 – 2012-09-12 (×2): 40 mg via SUBCUTANEOUS
  Filled 2012-09-11 (×3): qty 0.4

## 2012-09-11 MED ORDER — MOMETASONE FURO-FORMOTEROL FUM 100-5 MCG/ACT IN AERO
2.0000 | INHALATION_SPRAY | Freq: Two times a day (BID) | RESPIRATORY_TRACT | Status: DC
  Administered 2012-09-11 – 2012-09-12 (×3): 2 via RESPIRATORY_TRACT
  Filled 2012-09-11: qty 8.8

## 2012-09-11 MED ORDER — LOSARTAN POTASSIUM 50 MG PO TABS
50.0000 mg | ORAL_TABLET | Freq: Every day | ORAL | Status: DC
  Administered 2012-09-11 – 2012-09-12 (×2): 50 mg via ORAL
  Filled 2012-09-11 (×2): qty 1

## 2012-09-11 MED ORDER — LOSARTAN POTASSIUM-HCTZ 50-12.5 MG PO TABS: 1.0000 | ORAL_TABLET | Freq: Every day | ORAL | Status: DC

## 2012-09-11 MED ORDER — MONTELUKAST SODIUM 10 MG PO TABS
10.0000 mg | ORAL_TABLET | Freq: Every day | ORAL | Status: DC
  Administered 2012-09-11 – 2012-09-13 (×3): 10 mg via ORAL
  Filled 2012-09-11 (×3): qty 1

## 2012-09-11 MED ORDER — ACETAMINOPHEN 325 MG PO TABS: 650.0000 mg | ORAL_TABLET | Freq: Once | ORAL | Status: DC

## 2012-09-11 MED ORDER — ACETAMINOPHEN 650 MG RE SUPP: 650.0000 mg | Freq: Four times a day (QID) | RECTAL | Status: DC | PRN

## 2012-09-11 MED ORDER — COENZYME Q10 30 MG PO CAPS: 30.0000 mg | ORAL_CAPSULE | Freq: Every day | ORAL | Status: DC

## 2012-09-11 MED ORDER — SODIUM CHLORIDE 0.9 % IJ SOLN
3.0000 mL | Freq: Two times a day (BID) | INTRAMUSCULAR | Status: DC
  Administered 2012-09-12: 3 mL via INTRAVENOUS

## 2012-09-11 MED ORDER — VITAMIN D3 25 MCG (1000 UNIT) PO TABS
1000.0000 [IU] | ORAL_TABLET | Freq: Every day | ORAL | Status: DC
  Administered 2012-09-11 – 2012-09-13 (×3): 1000 [IU] via ORAL
  Filled 2012-09-11 (×3): qty 1

## 2012-09-11 MED ORDER — ONDANSETRON HCL 4 MG/2ML IJ SOLN: 4.0000 mg | Freq: Three times a day (TID) | INTRAMUSCULAR | Status: DC | PRN

## 2012-09-11 MED ORDER — HYDROCHLOROTHIAZIDE 25 MG PO TABS
25.0000 mg | ORAL_TABLET | Freq: Every day | ORAL | Status: DC
  Administered 2012-09-11 – 2012-09-12 (×2): 25 mg via ORAL
  Filled 2012-09-11 (×2): qty 1

## 2012-09-11 MED ORDER — ACETAMINOPHEN 325 MG PO TABS
650.0000 mg | ORAL_TABLET | Freq: Every day | ORAL | Status: DC
  Administered 2012-09-12: 650 mg via ORAL

## 2012-09-11 MED ORDER — ACETAMINOPHEN 325 MG PO TABS
650.0000 mg | ORAL_TABLET | Freq: Four times a day (QID) | ORAL | Status: DC | PRN
  Filled 2012-09-11: qty 2

## 2012-09-11 MED ORDER — ASPIRIN EC 81 MG PO TBEC
81.0000 mg | DELAYED_RELEASE_TABLET | Freq: Every day | ORAL | Status: DC
  Administered 2012-09-11 – 2012-09-13 (×3): 81 mg via ORAL
  Filled 2012-09-11 (×3): qty 1

## 2012-09-11 NOTE — Progress Notes (Signed)
PHARMACIST - PHYSICIAN ORDER COMMUNICATION  CONCERNING: P&T Medication Policy on Herbal Medications  DESCRIPTION:  This patient's order for:  Co-enzyme Q10 has been noted.  This product(s) is classified as an "herbal" or natural product. Due to a lack of definitive safety studies or FDA approval, nonstandard manufacturing practices, plus the potential risk of unknown drug-drug interactions while on inpatient medications, the Pharmacy and Therapeutics Committee does not permit the use of "herbal" or natural products of this type within [facility].   ACTION TAKEN: The pharmacy department is unable to verify this order at this time and your patient has been informed of this safety policy. Please reevaluate patient's clinical condition at discharge and address if the herbal or natural product(s) should be resumed at that time.   

## 2012-09-11 NOTE — H&P (Signed)
[facility] History and Physical  STEVIE ERTLE YNW:[id number] DOB: [birth year] DOA: 09/11/2012   PCP: Warrick Parisian, MD   Chief Complaint: Dyspnea on exertion, abnormal hemoglobin  HPI:  72 year old female with a history of hypertension, asthma, breast cancer status post mastectomy presents with a one-month history of progressive dyspnea on exertion. The patient went to see her primary care physician yesterday for the same complaints. The patient was to hold that she had H0 fibrillation when she was in the office as confirmed by an EKG. The patient was started on rivaroxaban and diltiazem. She was scheduled to see a cardiologist, Dr. Kristeen Miss on 09/12/2012. Routine blood work is drawn yesterday including a CBC. Her hemoglobin came back at 7.6 at which point her primary care physician told her to come to the emergency department. The patient had a hemoglobin of 11.2 on 03/21/2012. The patient has had intermittent chest discomfort with her dyspnea on exertion. At rest, she has no discomfort. She is currently pain-free. The patient denies any dizziness or syncope. The patient has taken one dose of rivaroxaban yesterday. The patient relates a history of iron deficiency anemia in the past, but she stopped taking iron after only 2 months in March 2014. The patient denies any hematemesis, hematuria, hematochezia, or melena. In fact, the patient had a colonoscopy on 08/19/2012 which showed abnormal mucosa of her ileocecal valve. However, biopsies were negative for any active inflammation. Other than some small internal hemorrhoids, there were no other active findings on the colonoscopy.  In the emergency department, the patient was noted to be initially tachycardic with a heart rate of 114. EKG was sinus rhythm. She has remained in sinus rhythm throughout her ED course. The patient has remained hemodynamically stable. The patient was given 2 L normal saline with improvement of her heart rate 93.  BMP was unremarkable. CBC showed hemoglobin 7.4 with MCV 64. On 03/21/2012, the patient had iron saturation 7%, ferritin was 9. EKG with sinus rhythm without ST-T wave changes. She did not have any abdominal pain, nausea, vomiting, or weight loss. She is compliant with her Arimidex for her left-sided breast cancer status post mastectomy. Assessment/Plan: Symptomatic iron deficiency anemia -Hemoccult in the emergency department was negative -Patient stopped taking iron supplementation 2 months ago -Transfuse 2 units PRBC -Iron studies were obtained prior to the transfusion -Start patient on oral supplementation Atrial fibrillation -This was reported from the patient -This was noted on EKG and her primary care physician's office on 09/10/2012 -The patient has requested cardiology evaluation while she is in the hospital -I have consulted the cardiologist she intended to see -Current CHADS score is 1, CHADS-VASc score is 2  -continue ASA for now until seen by cardiology -TSH -Echocardiogram Chest discomfort -Cycle troponin -Initial EKG is reassuring Asthma -Clinically compensated without exacerbation -Continue Advair, Singulair -PRN albuterol Hypertension -Continue losartan-hydrochlorothiazide       Past Medical History  Diagnosis Date  . Asthma   . Hypertension   . Osteoarthritis   . Chest pain   . Palpitations   . Obesity   . Bronchitis   . Environmental allergies   . Vertigo   . Constipation   . Urgency of urination   . Frequency of urination   . Cancer 2013    left breast CA  . Dry skin   . GERD (gastroesophageal reflux disease)     heart burn occasionally   Past Surgical History  Procedure Laterality Date  . Tonsillectomy and adenoidectomy    .  Tvh and bso  1994  . Tibia fracture surgery  1995    and fibular  with int fx  . Cholecystectomy    . Abdominal hysterectomy    . Lithotripsy    . Lithectomy  2010  . Mastectomy w/ sentinel node biopsy  01/04/2012     Procedure: MASTECTOMY WITH SENTINEL LYMPH NODE BIOPSY;  Surgeon: Almond Lint, MD;  Location: [facility] OR;  Service: General;  Laterality: Left;   Social History:  reports that she has never smoked. She has never used smokeless tobacco. She reports that  drinks alcohol. She reports that she does not use illicit drugs.   Family History  Problem Relation Age of Onset  . Heart attack Father   . Hypertension Father   . Heart disease Father   . Ovarian cancer Mother 20  . Uterine cancer Maternal Grandmother     late 66s  . Liver cancer Paternal Grandfather   . Thyroid cancer Sister 42  . Colon cancer Neg Hx   . Stomach cancer Neg Hx      Allergies  Allergen Reactions  . Adhesive (Tape) Other (See Comments)    Surgical tape causes blisters  . Oxycodone-Acetaminophen Nausea Only  . Terramycin (Oxytetracycline) Rash      Prior to Admission medications   Medication Sig Start Date End Date Taking? Authorizing Provider  acetaminophen (TYLENOL) 325 MG tablet Take 650 mg by mouth at bedtime.   Yes Historical Provider, MD  anastrozole (ARIMIDEX) 1 MG tablet Take 1 tablet (1 mg total) by mouth daily. 03/21/12  Yes Augustin Schooling, NP  aspirin EC 81 MG tablet Take 81 mg by mouth daily.   Yes Historical Provider, MD  celecoxib (CELEBREX) 200 MG capsule Take 200 mg by mouth daily as needed.   Yes Historical Provider, MD  cholecalciferol (VITAMIN D) 1000 UNITS tablet Take 1,000 Units by mouth daily.   Yes Historical Provider, MD  co-enzyme Q-10 30 MG capsule Take 30 mg by mouth daily.   Yes Historical Provider, MD  fish oil-omega-3 fatty acids 1000 MG capsule Take 1 g by mouth daily.   Yes Historical Provider, MD  Fluticasone-Salmeterol (ADVAIR) 250-50 MCG/DOSE AEPB Inhale 1 puff into the lungs 2 (two) times daily.   Yes Historical Provider, MD  hydrochlorothiazide (MICROZIDE) 12.5 MG capsule Take 12.5 mg by mouth daily.   Yes Historical Provider, MD  hydrocortisone 2.5 % cream Apply 1  application topically 4 (four) times daily.  04/24/12  Yes Historical Provider, MD  ipratropium-albuterol (DUONEB) 0.5-2.5 (3) MG/3ML SOLN Take 3 mLs by nebulization every 6 (six) hours as needed. For wheezing   Yes Historical Provider, MD  loratadine (CLARITIN) 10 MG tablet Take 10 mg by mouth daily as needed. For allergies   Yes Historical Provider, MD  losartan-hydrochlorothiazide (HYZAAR) 50-12.5 MG per tablet Take 1 tablet by mouth daily.   Yes Historical Provider, MD  montelukast (SINGULAIR) 10 MG tablet Take 10 mg by mouth daily.    Yes Historical Provider, MD  RESVERATROL PO Take 1 tablet by mouth daily.   Yes Historical Provider, MD  Rivaroxaban (XARELTO PO) Take 1 tablet by mouth daily.   Yes Historical Provider, MD    Review of Systems:  Constitutional:  No weight loss, night sweats, Fevers, chills, fatigue.  Head&Eyes: No headache.  No vision loss.  No eye pain or scotoma ENT:  No Difficulty swallowing,Tooth/dental problems,Sore throat,  No ear ache, post nasal drip,  Cardio-vascular:  Patient complains of chest discomfort with  dyspnea on exertion GI:  No  abdominal pain, nausea, vomiting, diarrhea, loss of appetite, hematochezia, melena, heartburn, indigestion, Resp:  No cough. No coughing up of blood .No wheezing.No chest wall deformity  Skin:  no rash or lesions.  GU:  no dysuria, change in color of urine, no urgency or frequency. No flank pain.  Musculoskeletal:  No joint pain or swelling. No decreased range of motion. No back pain.  Psych:  No change in mood or affect. No depression or anxiety. Neurologic: No headache, no dysesthesia, no focal weakness, no vision loss. No syncope  Physical Exam: Filed Vitals:   09/11/12 1530 09/11/12 1531 09/11/12 1600 09/11/12 1605  BP: 136/79 136/99 134/78 128/71  Pulse: 96 99 93   Temp:  99 F (37.2 C)  99 F (37.2 C)  TempSrc:  Oral  Oral  Resp: 30 23 25    SpO2: 99%  94%    General:  A&O x 3, NAD, nontoxic,  pleasant/cooperative Head/Eye: No conjunctival hemorrhage, no icterus, [place]/AT, No nystagmus ENT:  No icterus,  No thrush, good dentition, no pharyngeal exudate Neck:  No masses, no lymphadenpathy, no bruits CV:  RRR, no rub, no gallop, no S3 Lung:  CTAB, good air movement, no wheeze, no rhonchi Abdomen: soft/NT, +BS, nondistended, no peritoneal signs Ext: No cyanosis, No rashes, No petechiae, No lymphangitis, No edema   Labs on Admission:  Basic Metabolic Panel:  Recent Labs Lab 09/11/12 1200  NA 138  K 3.5  CL 97  CO2 28  GLUCOSE 99  BUN 13  CREATININE 0.73  CALCIUM 9.6   Liver Function Tests:  Recent Labs Lab 09/11/12 1200  AST 23  ALT 13  ALKPHOS 106  BILITOT 0.3  PROT 7.0  ALBUMIN 3.6   No results found for this basename: LIPASE, AMYLASE,  in the last 168 hours No results found for this basename: AMMONIA,  in the last 168 hours CBC:  Recent Labs Lab 09/11/12 1200  WBC 9.4  NEUTROABS 6.8  HGB 7.4*  HCT 26.2*  MCV 63.9*  PLT 434*   Cardiac Enzymes: No results found for this basename: CKTOTAL, CKMB, CKMBINDEX, TROPONINI,  in the last 168 hours BNP: No components found with this basename: POCBNP,  CBG: No results found for this basename: GLUCAP,  in the last 168 hours  Radiological Exams on Admission: No results found.  EKG: Independently reviewed. Sinus rhythm, no ST-T wave change    Time spent:70 minutes Code Status:   FULL Family Communication:   Family at bedside   Claiborne Stroble, DO  [facility] Pager [phone]  If 7PM-7AM, please contact night-coverage [email or web address] [facility] 09/11/2012, 4:37 PM

## 2012-09-11 NOTE — ED Notes (Signed)
Has been tired  alot latlely denies black stools

## 2012-09-11 NOTE — ED Provider Notes (Signed)
History     CSN: [id number]  Arrival date & time 09/11/12  1030   First MD Initiated Contact with Patient 09/11/12 1117      Chief Complaint  Patient presents with  . Atrial Fibrillation  . Anemia    (Consider location/radiation/quality/duration/timing/severity/associated sxs/prior treatment) HPI Comments: Patient is a 72 year old female with a past medical history of hypertension, allergies, and constipation who presents with a 1 month history of fatigue. Symptoms started gradually and progressively worsened since the onset. Patient reports SOB and fatigue especially with minimal exertion which is not normal for her. Patient reports going to her PCP yesterday for lab work. Patient was in atrial fibrillation according to the EKG done at the office yesterday. Patient has never been told she has atrial fibrillation previously. Patient was called this morning because her hemoglobin was 7.6 and told to come to the ER. No other associated symptoms. No alleviating factors. Patient denies bloody stools and does not take blood thinners.    Past Medical History  Diagnosis Date  . Asthma   . Hypertension   . Osteoarthritis   . Chest pain   . Palpitations   . Obesity   . Bronchitis   . Environmental allergies   . Vertigo   . Constipation   . Urgency of urination   . Frequency of urination   . Cancer 2013    left breast CA  . Dry skin   . GERD (gastroesophageal reflux disease)     heart burn occasionally    Past Surgical History  Procedure Laterality Date  . Tonsillectomy and adenoidectomy    . Tvh and bso  1994  . Tibia fracture surgery  1995    and fibular  with int fx  . Cholecystectomy    . Abdominal hysterectomy    . Lithotripsy    . Lithectomy  2010  . Mastectomy w/ sentinel node biopsy  01/04/2012    Procedure: MASTECTOMY WITH SENTINEL LYMPH NODE BIOPSY;  Surgeon: Almond Lint, MD;  Location: [facility] OR;  Service: General;  Laterality: Left;    Family History  Problem  Relation Age of Onset  . Heart attack Father   . Hypertension Father   . Heart disease Father   . Ovarian cancer Mother 49  . Uterine cancer Maternal Grandmother     late 30s  . Liver cancer Paternal Grandfather   . Thyroid cancer Sister 66  . Colon cancer Neg Hx   . Stomach cancer Neg Hx     History  Substance Use Topics  . Smoking status: Never Smoker   . Smokeless tobacco: Never Used  . Alcohol Use: 0.0 oz/week     Comment: occasionally    OB History   Grav Para Term Preterm Abortions TAB SAB Ect Mult Living                  Review of Systems  Constitutional: Positive for fatigue.  Respiratory: Positive for shortness of breath.   All other systems reviewed and are negative.    Allergies  Adhesive; Oxycodone-acetaminophen; and Terramycin  Home Medications   Current Outpatient Rx  Name  Route  Sig  Dispense  Refill  . acetaminophen (TYLENOL) 325 MG tablet   Oral   Take 650 mg by mouth at bedtime.         Marland Kitchen anastrozole (ARIMIDEX) 1 MG tablet   Oral   Take 1 tablet (1 mg total) by mouth daily.   90 tablet  12   . aspirin EC 81 MG tablet   Oral   Take 81 mg by mouth daily.         . celecoxib (CELEBREX) 200 MG capsule   Oral   Take 200 mg by mouth daily as needed.         . cholecalciferol (VITAMIN D) 1000 UNITS tablet   Oral   Take 1,000 Units by mouth daily.         . CHONDROITIN SULFATE A PO   Oral   Take 1 tablet by mouth daily.         Marland Kitchen co-enzyme Q-10 30 MG capsule   Oral   Take 30 mg by mouth daily.         . diphenhydrAMINE (BENADRYL) 25 MG tablet   Oral   Take 25 mg by mouth at bedtime.         . fish oil-omega-3 fatty acids 1000 MG capsule   Oral   Take 1 g by mouth daily.         . Fluticasone-Salmeterol (ADVAIR) 250-50 MCG/DOSE AEPB   Inhalation   Inhale 1 puff into the lungs 2 (two) times daily.         . hydrochlorothiazide (MICROZIDE) 12.5 MG capsule   Oral   Take 12.5 mg by mouth daily.         Marland Kitchen  HYDROcodone-acetaminophen (NORCO) 5-325 MG per tablet   Oral   Take 1-2 tablets by mouth every 4 (four) hours as needed for pain.   30 tablet   1   . hydrocortisone 2.5 % cream   Topical   Apply 1 application topically 4 (four) times daily.          Marland Kitchen ipratropium-albuterol (DUONEB) 0.5-2.5 (3) MG/3ML SOLN   Nebulization   Take 3 mLs by nebulization every 6 (six) hours as needed. For wheezing         . loratadine (CLARITIN) 10 MG tablet   Oral   Take 10 mg by mouth daily as needed. For allergies         . losartan-hydrochlorothiazide (HYZAAR) 50-12.5 MG per tablet   Oral   Take 1 tablet by mouth daily.         . montelukast (SINGULAIR) 10 MG tablet   Oral   Take 10 mg by mouth at bedtime.           BP 130/79  Pulse 95  Temp(Src) 98.4 F (36.9 C)  Resp 19  SpO2 98%  Physical Exam  Nursing note and vitals reviewed. Constitutional: She is oriented to person, place, and time. She appears well-developed and well-nourished. No distress.  HENT:  Head: Normocephalic and atraumatic.  Eyes: Conjunctivae and EOM are normal. Pupils are equal, round, and reactive to light.  Pale conjunctiva.   Neck: Normal range of motion.  Cardiovascular: Normal rate and regular rhythm.  Exam reveals no gallop and no friction rub.   No murmur heard. Pulmonary/Chest: Effort normal and breath sounds normal. She has no wheezes. She has no rales. She exhibits no tenderness.  Abdominal: Soft. She exhibits no distension. There is no tenderness. There is no rebound and no guarding.  Genitourinary:  No blood per rectal exam. External hemmorhoids noted.   Musculoskeletal: Normal range of motion.  Neurological: She is alert and oriented to person, place, and time. Coordination normal.  Speech is goal-oriented. Moves limbs without ataxia.   Skin: Skin is warm and dry. There is pallor.  Psychiatric:  She has a normal mood and affect. Her behavior is normal.    ED Course  Procedures (including  critical care time)  Labs Reviewed  CBC WITH DIFFERENTIAL - Abnormal; Notable for the following:    Hemoglobin 7.4 (*)    HCT 26.2 (*)    MCV 63.9 (*)    MCH 18.0 (*)    MCHC 28.2 (*)    RDW 18.6 (*)    Platelets 434 (*)    All other components within normal limits  COMPREHENSIVE METABOLIC PANEL - Abnormal; Notable for the following:    GFR calc non Af Amer 85 (*)    All other components within normal limits  VITAMIN B12  FOLATE  IRON AND TIBC  FERRITIN  RETICULOCYTES  TYPE AND SCREEN  ABO/RH   No results found.   1. Anemia requiring transfusions       MDM  12:43 PM Labs show hemoglobin of 7.4. Patient will have fluids. Occult stool pending.   2:07 PM Occult stool negative. Patient blood type O+. I will order blood. Dr. Arbutus Leas will admit the patient.       Emilia Beck, PA-C 09/11/12 1409

## 2012-09-11 NOTE — ED Notes (Signed)
States fluttering in her chest for a while went to dr yesterday and had labs drawn was called today and told that  Her hgb is 7.6 and was told to come to er

## 2012-09-11 NOTE — ED Provider Notes (Signed)
Medical screening examination/treatment/procedure(s) were conducted as a shared visit with non-physician practitioner(s) and myself.  I personally evaluated the patient during the encounter.   Patient complaining of progressively worsening dyspnea, especially with exertion. Blood work at her doctor's office yesterday revealed anemia, sent to the for further evaluation. Anemia confirmed, the source of anemia currently unclear. Will require hospitalization.  Gilda Crease, MD 09/11/12 1409

## 2012-09-12 ENCOUNTER — Encounter (HOSPITAL_COMMUNITY): Payer: Self-pay | Admitting: Physician Assistant

## 2012-09-12 ENCOUNTER — Encounter: Payer: Medicare PPO | Admitting: Cardiovascular Disease

## 2012-09-12 DIAGNOSIS — D649 Anemia, unspecified: Secondary | ICD-10-CM

## 2012-09-12 DIAGNOSIS — I10 Essential (primary) hypertension: Secondary | ICD-10-CM

## 2012-09-12 DIAGNOSIS — I059 Rheumatic mitral valve disease, unspecified: Secondary | ICD-10-CM

## 2012-09-12 DIAGNOSIS — R000 Tachycardia, unspecified: Secondary | ICD-10-CM

## 2012-09-12 DIAGNOSIS — I441 Atrioventricular block, second degree: Secondary | ICD-10-CM

## 2012-09-12 DIAGNOSIS — R079 Chest pain, unspecified: Secondary | ICD-10-CM

## 2012-09-12 DIAGNOSIS — I498 Other specified cardiac arrhythmias: Secondary | ICD-10-CM

## 2012-09-12 LAB — TROPONIN I
Troponin I: <0.3 ng/mL (ref <0.30)
Troponin I: <0.3 ng/mL (ref <0.30)

## 2012-09-12 LAB — CBC
HCT: 28.9 % — ABNORMAL LOW (ref 36.0–46.0)
Hemoglobin: 8.6 g/dL — ABNORMAL LOW (ref 12.0–15.0)
MCH: 20 pg — ABNORMAL LOW (ref 26.0–34.0)
MCV: 67.1 fL — ABNORMAL LOW (ref 78.0–100.0)
Platelets: 371 10*3/uL (ref 150–400)
RBC: 4.31 MIL/uL (ref 3.87–5.11)

## 2012-09-12 LAB — BASIC METABOLIC PANEL
BUN: 10 mg/dL (ref 6–23)
CO2: 24 mEq/L (ref 19–32)
Calcium: 9.3 mg/dL (ref 8.4–10.5)
Creatinine, Ser: 0.66 mg/dL (ref 0.50–1.10)
Glucose, Bld: 116 mg/dL — ABNORMAL HIGH (ref 70–99)

## 2012-09-12 LAB — IRON AND TIBC
Iron: <10 ug/dL — ABNORMAL LOW (ref 42–135)
UIBC: 489 ug/dL — ABNORMAL HIGH (ref 125–400)

## 2012-09-12 LAB — HEMOGLOBIN AND HEMATOCRIT, BLOOD
HCT: 31 % — ABNORMAL LOW (ref 36.0–46.0)
Hemoglobin: 9.3 g/dL — ABNORMAL LOW (ref 12.0–15.0)

## 2012-09-12 LAB — TSH: TSH: 1.877 u[IU]/mL (ref 0.350–4.500)

## 2012-09-12 MED ORDER — HYDROCHLOROTHIAZIDE 25 MG PO TABS
12.5000 mg | ORAL_TABLET | Freq: Every day | ORAL | Status: DC
  Filled 2012-09-12: qty 0.5

## 2012-09-12 MED ORDER — POTASSIUM CHLORIDE CRYS ER 20 MEQ PO TBCR
40.0000 meq | EXTENDED_RELEASE_TABLET | Freq: Once | ORAL | Status: AC
  Administered 2012-09-12: 40 meq via ORAL
  Filled 2012-09-12: qty 2

## 2012-09-12 NOTE — Progress Notes (Addendum)
PROGRESS NOTE  Ann Maxwell WUJ:[id number] DOB: 01/08/1941 DOA: 09/11/2012 PCP: Warrick Parisian, MD  Brief narrative: 72 year old female with known history of breast cancer status post mastectomy [10 cm DCIS ]admitted 5/28 with hemoglobin 7.9 [baseline 11] as well as progressive dyspnea on exertion. Found at PCPs on 5/27 office to be in atrial fibrillation and started on serological and Cardizem-followup blood work revealed a low hemoglobin and patient was referred to the emergency room. Notable history of colonoscopy 08/19/2012 for abnormal mucosa ileocecal valve and no other active initially tachycardic with a heart rate of 114. EKG was sinus rhythm. She has remained in sinus rhythm throughout her ED course. The patient has remained hemodynamically stable. The patient was given 2 L normal saline with improvement of her heart rate 93. BMP was unremarkable. CBC showed hemoglobin 7.4 with MCV 64. On 03/21/2012, the patient had iron saturation 7%, ferritin was 9. EKG with sinus rhythm without ST-T wave changes  Followed by Dr. Welton Flakes as well as Dr. Donell Beers for breast cancer 02/29/2008 History of right upper ureteric calculus with obstruction and fever  Consultants:  none  Procedures:  None currently  Antibiotics:  none   Subjective  Currently feels well and is about eat lunch. Has received 2 units packed red blood cells. No further shortness breath or chest pain. No nausea vomiting States she's had a mild cough which goes along with her seasonal allergies Denies any lower extremity swelling orthopnea   Objective    Interim History: NAD  Telemetry: Atrial fibrillation with rates varying from 90s to 110s  Objective: Filed Vitals:   09/11/12 2337 09/12/12 0025 09/12/12 0500 09/12/12 0938  BP: 102/68 127/86 100/64 108/68  Pulse: 96 96 95 99  Temp: 98.5 F (36.9 C) 98.2 F (36.8 C) 98.5 F (36.9 C)   TempSrc:      Resp: 20 22 18    Height:      Weight:      SpO2:   95%      Intake/Output Summary (Last 24 hours) at 09/12/12 1307 Last data filed at 09/11/12 2237  Gross per 24 hour  Intake    255 ml  Output      0 ml  Net    255 ml    Exam:  General: A pleasant oriented no apparent distress Cardiovascular: S1-S2 tachycardic, no murmur rub or gallop Respiratory: Clinically clear Abdomen: Soft nontender nondistended Skin no lower extremity edema Neuro grossly intact  Data Reviewed: Basic Metabolic Panel:  Recent Labs Lab 09/11/12 1200 09/12/12 0815  NA 138 138  K 3.5 3.3*  CL 97 99  CO2 28 24  GLUCOSE 99 116*  BUN 13 10  CREATININE 0.73 0.66  CALCIUM 9.6 9.3   Liver Function Tests:  Recent Labs Lab 09/11/12 1200  AST 23  ALT 13  ALKPHOS 106  BILITOT 0.3  PROT 7.0  ALBUMIN 3.6   No results found for this basename: LIPASE, AMYLASE,  in the last 168 hours No results found for this basename: AMMONIA,  in the last 168 hours CBC:  Recent Labs Lab 09/11/12 1200 09/12/12 0815  WBC 9.4 8.9  NEUTROABS 6.8  --   HGB 7.4* 8.6*  HCT 26.2* 28.9*  MCV 63.9* 67.1*  PLT 434* 371   Cardiac Enzymes:  Recent Labs Lab 09/12/12 0119 09/12/12 0815  TROPONINI <0.30 <0.30   BNP: No components found with this basename: POCBNP,  CBG: No results found for this basename: GLUCAP,  in the last 168 hours  No results found for this or any previous visit (from the past 240 hour(s)).   Studies:              All Imaging reviewed and is as per above notation   Scheduled Meds: . sodium chloride   Intravenous STAT  . acetaminophen  650 mg Oral QHS  . acetaminophen  650 mg Oral Once  . anastrozole  1 mg Oral Daily  . aspirin EC  81 mg Oral Daily  . cholecalciferol  1,000 Units Oral Daily  . enoxaparin (LOVENOX) injection  40 mg Subcutaneous Q24H  . ferrous sulfate  325 mg Oral BID WC  . hydrochlorothiazide  25 mg Oral Daily  . losartan  50 mg Oral Daily  . mometasone-formoterol  2 puff Inhalation BID  . montelukast  10 mg Oral Daily   . omega-3 acid ethyl esters  1 g Oral Daily  . sodium chloride  1,000 mL Intravenous Once  . sodium chloride  3 mL Intravenous Q12H   Continuous Infusions:    Assessment/Plan: 1. Symptomatic iron deficiency anemia-status post 2 units packed red blood cells. Hemoglobin currently 8.6 with MCV 67.1.  Iron less than 10 ferritin 3.  Unlikely from GI causes as relatively normal colonoscopy recently no dark stool. Arimadex can cause in 2-5% of cases anemia.  Will monitor another hematocrit. If this is still low, will give IV iron and potential he consulted Dr. Welton Flakes for further recommendations and perhaps gastroenterology. Guaiac all stools.  Continue Ferrous sulphate 325 bid addend-hemoglobin no appropriate.  Monitor closely. 2.   Addend-patient doesn't have Afib.  Will hold antiarrhythmics and anti-coagulation. 3. Htn- cut HCTZ 25->12.5 5.28.14 and losartan to give room for B-blocker/Cardizem if prn 4. Breast cancer, DCIS 1 cm with 5 mm cancer focus, s/p Mastectomy on Arimidex-Stable currently. 5. Chest pressure-Troponins so far neg. 6. Asthma-continue Albuterol 2.5 q6prn,Dulera 100-5 2 puffs , Singulair 10 mg daily  Code Status: Full Family Communication:  Discussed fully c family Disposition Plan: Inpatient   Pleas Koch, MD  [facility] Pager [phone] 09/12/2012, 1:07 PM    LOS: 1 day

## 2012-09-12 NOTE — Consult Note (Signed)
CARDIOLOGY CONSULT NOTE  Patient ID: Ann Maxwell, MRN: [id number], DOB/AGE: Nov 12, 1940 72 y.o. 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 She was seen by her PCP for complaints of exertional SOB/chest discomfort on 09/10/12. EKG showed tachycardia, interpreted as atrial fibrillation, and she was prescribed Xarelto and diltiazem. She had an appt to establish care with Dr. Elease Maxwell scheduled for today. Routine labs were drawn (before taking these new meds) and her Hgb was noted to be low, thus she was referred to the ER. She reports a history of iron deficiency anemia in the past, but stopped taking iron after only 2 months in March 2014. She has not been feeling well for several weeks. She initially developed cough, wheezing, and fatigue that she attributed to pollen. However, after cough/wheezing resolved, the fatigue and dyspnea persisted so she sought care. Over the last few days she was unable to exert herself short distances without experiencing SOB or mild chest discomfort. She has not had any symptoms at rest. She denies any hematemesis, hematuria, hematochezia, melena, diaphoresis, syncope, nausea, or vomiting. She denies any palpitations. Per IM notes, colonscopy 08/19/12 showed small internal hemorrhoids and abnormal mucosa of her ileocecal valve but biopsies were negative for any active inflammation. She has received 2 U pRBCs with resolution of exertional SOB/CP. She currently feels much better. TSH WNL, FOBT neg,. K 3.3. 2D echo pending.  We have  received a copy of the EKG faxed over from primary's office. This shows sinus tach with a long first degree AVB, which gave the appearance of absence of P waves but is actually still sinus. This is more clearly seen on telemetry while in the hospital here. Tele also shows It also shows two episodes of isolated dropped beats which appear to be Wenckebach.  Past Medical History  Diagnosis Date  . Asthma   . Hypertension   . Osteoarthritis   . Obesity   . Bronchitis   . Environmental allergies   . Vertigo   . Breast CA 2013    a. L breast DCIS, s/p L mastectomy 12/2011.  . Dry skin   . GERD (gastroesophageal reflux disease)     heartburn occasionally      Most Recent Cardiac Studies: 2D Echo 06/2008: normal LV EF 60-65%, no WMA, mild LVH. Normal RV size and function. Mitral inflow and tissue doppler c/w impaired LV relaxation. Trivial MR and TR. Normal PA pressure.  06/2008 Nuc - normal   Surgical History:  Past Surgical History  Procedure Laterality Date  . Tonsillectomy and adenoidectomy    . Tvh and bso  1994  . Tibia fracture surgery  1995    and fibular  with int fx  . Cholecystectomy    . Abdominal hysterectomy    . Lithotripsy    . Lithectomy  2010  . Mastectomy w/ sentinel node biopsy  01/04/2012    Procedure: MASTECTOMY WITH SENTINEL LYMPH NODE BIOPSY;  Surgeon: Almond Lint, MD;  Location: [facility] OR;  Service: General;  Laterality: Left;     Home Meds:  Prior to Admission medications   Medication Sig Start Date End Date Taking? Authorizing Provider  acetaminophen (TYLENOL) 325 MG tablet Take 650 mg by mouth at bedtime.   Yes Historical Provider, MD  anastrozole (ARIMIDEX) 1 MG tablet Take 1 tablet (1 mg total) by mouth daily. 03/21/12  Yes Augustin Schooling, NP  aspirin EC 81 MG tablet Take 81 mg by mouth daily.   Yes Historical Provider, MD  celecoxib (CELEBREX) 200 MG capsule Take 200 mg by mouth daily as needed.   Yes Historical Provider, MD  cholecalciferol (VITAMIN D)  1000 UNITS tablet Take 1,000 Units by mouth daily.   Yes Historical Provider, MD  co-enzyme Q-10 30 MG capsule Take 30 mg by mouth daily.   Yes Historical Provider, MD  fish oil-omega-3 fatty acids 1000 MG capsule Take 1 g by mouth daily.   Yes Historical Provider, MD  Fluticasone-Salmeterol (ADVAIR) 250-50 MCG/DOSE AEPB Inhale 1 puff into the lungs 2 (two) times daily.   Yes Historical Provider, MD  hydrochlorothiazide (MICROZIDE) 12.5 MG capsule Take 12.5 mg by mouth daily.   Yes Historical Provider, MD  hydrocortisone 2.5 % cream Apply 1 application topically 4 (four) times daily.  04/24/12  Yes Historical Provider, MD  ipratropium-albuterol (DUONEB) 0.5-2.5 (3) MG/3ML SOLN Take 3 mLs by nebulization every 6 (six) hours as needed. For wheezing   Yes Historical Provider, MD  loratadine (CLARITIN) 10 MG tablet Take 10 mg by mouth daily as needed. For allergies   Yes Historical Provider, MD  losartan-hydrochlorothiazide (HYZAAR) 50-12.5 MG per tablet Take 1 tablet by mouth daily.   Yes Historical Provider, MD  montelukast (SINGULAIR) 10 MG tablet Take 10 mg by mouth daily.    Yes Historical Provider, MD  RESVERATROL PO Take 1 tablet by mouth daily.   Yes Historical Provider, MD  Rivaroxaban (XARELTO PO) Take 1 tablet by mouth daily.   Yes Historical Provider, MD    Inpatient Medications:  . sodium chloride   Intravenous STAT  . acetaminophen  650 mg Oral QHS  . acetaminophen  650 mg Oral Once  . anastrozole  1 mg Oral Daily  . aspirin EC  81 mg Oral Daily  . cholecalciferol  1,000 Units Oral Daily  . enoxaparin (LOVENOX) injection  40 mg Subcutaneous Q24H  . ferrous sulfate  325 mg Oral BID WC  . [START ON 09/13/2012] hydrochlorothiazide  12.5 mg Oral Daily  . mometasone-formoterol  2 puff Inhalation BID  . montelukast  10 mg Oral Daily  . omega-3 acid ethyl esters  1 g Oral Daily  . sodium chloride  1,000 mL Intravenous Once  . sodium chloride  3 mL Intravenous Q12H      Allergies:    Allergies  Allergen Reactions  . Adhesive (Tape) Other (See Comments)    Surgical tape causes blisters  . Oxycodone-Acetaminophen Nausea Only  . Terramycin (Oxytetracycline) Rash    History   Social History  . Marital Status: Divorced    Spouse Name: N/A    Number of Children: N/A  . Years of Education: N/A   Occupational History  . retired Engineer, civil (consulting)    Social History Main Topics  . Smoking status: Never Smoker   . Smokeless tobacco: Never Used  . Alcohol Use: 0.0 oz/week     Comment: occasionally  . Drug Use: No  . Sexually Active: No   Other Topics Concern  . Not on file   Social History Narrative   Retired Risk analyst, 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  who is a Engineer, civil (consulting)     Family History  Problem Relation Age of Onset  . Heart attack Father   . Hypertension Father   . Heart disease Father   . Ovarian cancer Mother 2  . Uterine cancer Maternal Grandmother     late 74s  . Liver cancer Paternal Grandfather   . Thyroid cancer Sister 78  . Colon cancer Neg Hx   . Stomach cancer Neg Hx      Review of Systems: General: negative for chills, fever, night sweats. Lost 2 lbs over last week due to decreased appetite Cardiovascular: no palpitations, orthopnea, PND, LEE, h/o CHF/TIA/stroke Dermatological: negative for rash Respiratory: negative for cough or wheezing now - had this in April but it resolved Urologic: negative for hematuria Abdominal: negative for nausea, vomiting, diarrhea, bright red blood per rectum, melena, or hematemesis Neurologic: negative for visual changes, syncope, or dizziness All other systems reviewed and are otherwise negative except as noted above.  Labs:  Recent Labs  09/12/12 0119 09/12/12 0815  TROPONINI <0.30 <0.30   Lab Results  Component Value Date   WBC 8.9 09/12/2012   HGB 8.6* 09/12/2012   HCT 28.9* 09/12/2012   MCV 67.1* 09/12/2012   PLT 371 09/12/2012    Recent Labs Lab 09/11/12 1200 09/12/12 0815  NA 138 138  K 3.5 3.3*  CL 97  99  CO2 28 24  BUN 13 10  CREATININE 0.73 0.66  CALCIUM 9.6 9.3  PROT 7.0  --   BILITOT 0.3  --   ALKPHOS 106  --   ALT 13  --   AST 23  --   GLUCOSE 99 116*   Radiology/Studies: no results found.  EKG: today: sinus tach 101bpm long first degree AVB no acute ST-T changes Yesterday: sinus tach 113bpm no acute ST-T changes   Physical Exam: Blood pressure 108/68, pulse 99, temperature 98.5 F (36.9 C), temperature source Oral, resp. rate 18, height 5\' 4"  (1.626 m), weight 232 lb 6.4 oz (105.416 kg), SpO2 95.00%. General: Well developed, well nourished  WF in no acute distress. Head: Normocephalic, atraumatic, sclera non-icteric, no xanthomas, nares are without discharge.  Neck: Negative for carotid bruits. JVD not elevated. Lungs: Clear bilaterally to auscultation without wheezes, rales, or rhonchi. Breathing is unlabored. Heart: RRR with S1 S2. Soft flow murmur heard best at RUSB. No rubs or gallops appreciated. Abdomen: Soft, non-tender, non-distended with normoactive bowel sounds. No hepatomegaly. No rebound/guarding. No obvious abdominal masses. Msk:  Strength and tone appear normal for age. Extremities: No clubbing or cyanosis. No edema.  Distal pedal pulses are 2+ and equal bilaterally. Neuro: Alert and oriented X 3. No facial asymmetry. No focal deficit. Moves all extremities spontaneously. Psych:  Responds to questions appropriately with a normal affect.   Assessment and Plan:  1. Dyspnea/chest discomfort - likely due to symptomatic anemia, now resolved. Consider baseline CXR. Await echocardiogram which has been done. 2. Symptomatic microcytic anemia - FOBT negative, smear showing elliptocytes. Could Arimidex be contributing? Further eval/tx per IM. 3. Tachycardia - this is not atrial fibrillation but instead sinus tach with a long first degree AVB. Her 1st degree AVB is chronic. Tachycardia likely secondary to anemia. Treat underlying cause.  4. Transient 2nd degree type I  heart block - very brief. Discontinue diltiazem. Follow on tele for recurrence. 2D echo pending as above. 5. Hypokalemia - replete K. Further repletion per IM. 6. Asthma - not wheezing.  Signed, Ronie Spies PA-C 09/12/2012, 2:55 PM  Patient seen and examined and history reviewed. Agree with above findings and plan. 72 yo WF admitted with symptoms of fatigue, dyspnea, and chest pain on exertion. Ecg on 09/10/12 was interpreted as atrial fibrillation. Subsequently found to be profoundly anemic with Hgb 7. Post transfusion her symptoms have resolved. Exam reveals clear lung fields. She has a 2/6 ejection murmur in the RUSB>>LSB. I have carefully reviewed all monitor tracings and Ecgs including the one on 09/10/12. These all demonstrate NSR with sinus tachy. No evidence of Afib. One brief episode of Wenkebach block which was asymptomtic. I recommend stopping diltiazem and Xarelto. Correct anemia. Will follow up on Echo.   Theron Arista [facility] 09/12/2012 3:58 PM

## 2012-09-12 NOTE — Progress Notes (Signed)
  Echocardiogram 2D Echocardiogram has been performed.  Cathie Beams 09/12/2012, 12:29 PM

## 2012-09-13 ENCOUNTER — Telehealth: Payer: Self-pay | Admitting: *Deleted

## 2012-09-13 ENCOUNTER — Other Ambulatory Visit: Payer: Self-pay | Admitting: Emergency Medicine

## 2012-09-13 ENCOUNTER — Telehealth: Payer: Self-pay | Admitting: Emergency Medicine

## 2012-09-13 LAB — CBC
HCT: 30 % — ABNORMAL LOW (ref 36.0–46.0)
MCHC: 29.7 g/dL — ABNORMAL LOW (ref 30.0–36.0)
Platelets: 371 10*3/uL (ref 150–400)
RDW: 21.3 % — ABNORMAL HIGH (ref 11.5–15.5)
WBC: 9.8 10*3/uL (ref 4.0–10.5)

## 2012-09-13 LAB — TYPE AND SCREEN
ABO/RH(D): O POS
Unit division: 0

## 2012-09-13 LAB — COMPREHENSIVE METABOLIC PANEL
AST: 19 U/L (ref 0–37)
Albumin: 3.2 g/dL — ABNORMAL LOW (ref 3.5–5.2)
Alkaline Phosphatase: 95 U/L (ref 39–117)
BUN: 13 mg/dL (ref 6–23)
Chloride: 99 mEq/L (ref 96–112)
Potassium: 4 mEq/L (ref 3.5–5.1)
Total Protein: 6.4 g/dL (ref 6.0–8.3)

## 2012-09-13 MED ORDER — HYDROCHLOROTHIAZIDE 12.5 MG PO CAPS
12.5000 mg | ORAL_CAPSULE | Freq: Every day | ORAL | Status: DC
  Administered 2012-09-13: 12.5 mg via ORAL
  Filled 2012-09-13: qty 1

## 2012-09-13 MED ORDER — FERROUS SULFATE 325 (65 FE) MG PO TABS: 325.0000 mg | ORAL_TABLET | Freq: Two times a day (BID) | ORAL | Status: DC

## 2012-09-13 NOTE — Progress Notes (Signed)
   See consult note by Dr. [place].  She was referred to [place] for atrial fib and DOE.  She is actually in NSR.  The DOE was due to her severe anemia which was caused by her chemotherapy.  Her symptoms have resolved after transfusion.   xarelto and diltiazem have been stopped.   At this point, I do not necessarily think that she needs to follow up with [place].  I will be glad to see her if needed in the future. Will sign off.  Ann Maxwell, Montez Hageman., MD, [facility] 09/13/2012, 8:06 AM Office - [phone] Pager [phone]

## 2012-09-13 NOTE — Telephone Encounter (Signed)
A nurse from 3rd floor at [facility] called regarding the patient. I have forwarded the message to the desk RN.  JMW

## 2012-09-13 NOTE — Telephone Encounter (Signed)
Instructed patient to keep 6/9 appointment with lab and MD.   Patient voiced concern about continuing the Arimidex since the hospitalist seems to feel that it was causing her anemia.   Patient instructed to stop the Arimidex until she is seen for MD visit for further instructions. Patient verbalized understanding.

## 2012-09-13 NOTE — Discharge Summary (Signed)
Physician Discharge Summary  Ann Maxwell YNW:[id number] DOB: 1940/11/09 DOA: 09/11/2012  PCP: Warrick Parisian, MD  Admit date: 09/11/2012 Discharge date: 09/13/2012  Time spent: 45 minutes  Recommendations for Outpatient Follow-up:  1. Needs CBC in about 1 week  2. Consider colonoscopy in about 3-6 mo-she didn't wish this done 3. NO NEED for anti-coagulation/Antiarrythmic  Discharge Diagnoses:  Active Problems:   Chest pain   HTN (hypertension)   Asthma, chronic   Sinus tachycardia   Mobitz type 1 second degree AV block   Discharge Condition: good  Diet recommendation: heart healthy  Filed Weights   09/11/12 1650 09/13/12 0529  Weight: 105.416 kg (232 lb 6.4 oz) 105.371 kg (232 lb 4.8 oz)    History of present illness:  72 year old female with known history of breast cancer status post mastectomy [10 cm DCIS ]admitted 5/28 with hemoglobin 7.9 [baseline 11] as well as progressive dyspnea on exertion. Found at PCPs on 5/27 office to be in sinus tachycardia and started on Xarelto and Cardizem-followup blood work revealed a low hemoglobin and patient was referred to the emergency room. Notable history of colonoscopy 08/19/2012 for abnormal mucosa on ileocecal valve and no other active bleeding Initially tachycardic with a heart rate of 114. EKG was sinus rhythm. She has remained in sinus rhythm throughout her ED course. The patient has remained hemodynamically stable. The patient was given 2 L normal saline with improvement of her heart rate 93. BMP was unremarkable. CBC showed hemoglobin 7.4 with MCV 64. On 03/21/2012, the patient had iron saturation 7%, ferritin was 9. EKG with sinus rhythm without ST-T wave changes  Please see below  Hospital Course:  Assessment/Plan:  1. Symptomatic iron deficiency anemia-status post 2 units packed red blood cells. Hemoglobin currently 8.6 with MCV 67.1. Iron less than 10 ferritin 3. Unlikely from GI causes as relatively normal colonoscopy  recently no dark stool. Arimadex can cause in 2-5% of cases anemia. SHe will stay on Po Iron 325 bid and will bneed rpt Hb.  Hb on d/c home was 8.9 2. Continue Ferrous sulphate 325 bid  3. ?Afib-Cardiology was consulted and strips were reviewed from PCP office and it was noted that  Patient not in Afib. Will hold antiarrhythmics and anti-coagulation. 4. Htn-resume all Ant-htn meds as out-patient 5. Breast cancer, DCIS 1 cm with 5 mm cancer focus, s/p Mastectomy on Arimidex-Stable currently-Oncology to comment as ou-patient whether Arimidex should be d/c 2/2 to this 6. Chest pressure-Troponins so far neg. 7. Asthma-continue Albuterol 2.5 q6prn,Dulera 100-5 2 puffs , Singulair 10 mg daily   Procedures:  Echo 5/30 - Left ventricle: The cavity size was normal. Wall thicknesswas increased in a pattern of mild LVH. There was mild focal basal hypertrophy of the septum. Systolic functionwas vigorous. The estimated ejection fraction was in therange of 65% to 70%. Wall motion was normal; there were noregional wall motion abnormalities.- Mitral valve: Mild regurgitation.  Consultations:  Cardiology  Discharge Exam: Filed Vitals:   09/12/12 0938 09/12/12 1526 09/12/12 2100 09/13/12 0529  BP: 108/68 121/72 136/78 126/47  Pulse: 99 94 114 86  Temp:  97.8 F (36.6 C) 98.2 F (36.8 C) 98.2 F (36.8 C)  TempSrc:  Oral Oral Oral  Resp:  18    Height:      Weight:    105.371 kg (232 lb 4.8 oz)  SpO2:  94% 95% 98%   Feels well, no isseus ambulant, no SOB Wishes to leave and go home   General: eomi, ncat  Cardiovascular:  s1 s2 no m/r/g Respiratory: clear  Discharge Instructions  Discharge Orders   Future Appointments Provider Department Dept Phone   09/23/2012 10:00 AM Dava Najjar Idelle Jo [facility] [phone]   09/23/2012 10:30 AM Victorino December, MD [facility] [phone]   Future Orders Complete By Expires     Call MD for:   difficulty breathing, headache or visual disturbances  As directed     Call MD for:  extreme fatigue  As directed     Call MD for:  severe uncontrolled pain  As directed     Call MD for:  temperature >100.4  As directed     Diet - low sodium heart healthy  As directed     Increase activity slowly  As directed         Medication List    STOP taking these medications       celecoxib 200 MG capsule  Commonly known as:  CELEBREX     XARELTO PO      TAKE these medications       acetaminophen 325 MG tablet  Commonly known as:  TYLENOL  Take 650 mg by mouth at bedtime.     anastrozole 1 MG tablet  Commonly known as:  ARIMIDEX  Take 1 tablet (1 mg total) by mouth daily.     aspirin EC 81 MG tablet  Take 81 mg by mouth daily.     cholecalciferol 1000 UNITS tablet  Commonly known as:  VITAMIN D  Take 1,000 Units by mouth daily.     co-enzyme Q-10 30 MG capsule  Take 30 mg by mouth daily.     ferrous sulfate 325 (65 FE) MG tablet  Take 1 tablet (325 mg total) by mouth 2 (two) times daily with a meal.     fish oil-omega-3 fatty acids 1000 MG capsule  Take 1 g by mouth daily.     Fluticasone-Salmeterol 250-50 MCG/DOSE Aepb  Commonly known as:  ADVAIR  Inhale 1 puff into the lungs 2 (two) times daily.     hydrochlorothiazide 12.5 MG capsule  Commonly known as:  MICROZIDE  Take 12.5 mg by mouth daily.     hydrocortisone 2.5 % cream  Apply 1 application topically 4 (four) times daily.     ipratropium-albuterol 0.5-2.5 (3) MG/3ML Soln  Commonly known as:  DUONEB  Take 3 mLs by nebulization every 6 (six) hours as needed. For wheezing     loratadine 10 MG tablet  Commonly known as:  CLARITIN  Take 10 mg by mouth daily as needed. For allergies     losartan-hydrochlorothiazide 50-12.5 MG per tablet  Commonly known as:  HYZAAR  Take 1 tablet by mouth daily.     montelukast 10 MG tablet  Commonly known as:  SINGULAIR  Take 10 mg by mouth daily.     RESVERATROL PO   Take 1 tablet by mouth daily.       Allergies  Allergen Reactions  . Adhesive (Tape) Other (See Comments)    Surgical tape causes blisters  . Oxycodone-Acetaminophen Nausea Only  . Terramycin (Oxytetracycline) Rash      The results of significant diagnostics from this hospitalization (including imaging, microbiology, ancillary and laboratory) are listed below for reference.    Significant Diagnostic Studies: No results found.  Microbiology: No results found for this or any previous visit (from the past 240 hour(s)).   Labs: Basic Metabolic Panel:  Recent  Labs Lab 09/11/12 1200 09/12/12 0815 09/13/12 0405  NA 138 138 138  K 3.5 3.3* 4.0  CL 97 99 99  CO2 28 24 29   GLUCOSE 99 116* 111*  BUN 13 10 13   CREATININE 0.73 0.66 0.77  CALCIUM 9.6 9.3 9.4   Liver Function Tests:  Recent Labs Lab 09/11/12 1200 09/13/12 0405  AST 23 19  ALT 13 10  ALKPHOS 106 95  BILITOT 0.3 0.3  PROT 7.0 6.4  ALBUMIN 3.6 3.2*   No results found for this basename: LIPASE, AMYLASE,  in the last 168 hours No results found for this basename: AMMONIA,  in the last 168 hours CBC:  Recent Labs Lab 09/11/12 1200 09/12/12 0815 09/12/12 1528 09/13/12 0405  WBC 9.4 8.9  --  9.8  NEUTROABS 6.8  --   --   --   HGB 7.4* 8.6* 9.3* 8.9*  HCT 26.2* 28.9* 31.0* 30.0*  MCV 63.9* 67.1*  --  67.9*  PLT 434* 371  --  371   Cardiac Enzymes:  Recent Labs Lab 09/12/12 0119 09/12/12 0815 09/12/12 1525  TROPONINI <0.30 <0.30 <0.30   BNP: BNP (last 3 results) No results found for this basename: PROBNP,  in the last 8760 hours CBG: No results found for this basename: GLUCAP,  in the last 168 hours     Signed:  Rhetta Mura  [facility] 09/13/2012, 8:40 AM

## 2012-09-23 ENCOUNTER — Telehealth: Payer: Self-pay | Admitting: Oncology

## 2012-09-23 ENCOUNTER — Other Ambulatory Visit (HOSPITAL_BASED_OUTPATIENT_CLINIC_OR_DEPARTMENT_OTHER): Payer: Medicare PPO | Admitting: Lab

## 2012-09-23 ENCOUNTER — Ambulatory Visit (HOSPITAL_BASED_OUTPATIENT_CLINIC_OR_DEPARTMENT_OTHER): Payer: Medicare PPO | Admitting: Oncology

## 2012-09-23 ENCOUNTER — Encounter: Payer: Self-pay | Admitting: Oncology

## 2012-09-23 VITALS — BP 115/71 | HR 104 | Temp 98.0°F | Resp 20 | Ht 64.0 in | Wt 230.0 lb

## 2012-09-23 DIAGNOSIS — C50519 Malignant neoplasm of lower-outer quadrant of unspecified female breast: Secondary | ICD-10-CM

## 2012-09-23 DIAGNOSIS — C50512 Malignant neoplasm of lower-outer quadrant of left female breast: Secondary | ICD-10-CM

## 2012-09-23 DIAGNOSIS — D509 Iron deficiency anemia, unspecified: Secondary | ICD-10-CM

## 2012-09-23 DIAGNOSIS — D649 Anemia, unspecified: Secondary | ICD-10-CM

## 2012-09-23 DIAGNOSIS — E559 Vitamin D deficiency, unspecified: Secondary | ICD-10-CM

## 2012-09-23 LAB — CBC WITH DIFFERENTIAL/PLATELET
Eosinophils Absolute: 0.2 10*3/uL (ref 0.0–0.5)
HCT: 32.9 % — ABNORMAL LOW (ref 34.8–46.6)
LYMPH%: 14.1 % (ref 14.0–49.7)
MCHC: 32.3 g/dL (ref 31.5–36.0)
MCV: 69.8 fL — ABNORMAL LOW (ref 79.5–101.0)
MONO#: 0.6 10*3/uL (ref 0.1–0.9)
MONO%: 8.7 % (ref 0.0–14.0)
NEUT#: 5.4 10*3/uL (ref 1.5–6.5)
NEUT%: 73.7 % (ref 38.4–76.8)
Platelets: 287 10*3/uL (ref 145–400)
WBC: 7.4 10*3/uL (ref 3.9–10.3)

## 2012-09-23 LAB — COMPREHENSIVE METABOLIC PANEL (CC13)
ALT: 13 U/L (ref 0–55)
AST: 15 U/L (ref 5–34)
Albumin: 3.5 g/dL (ref 3.5–5.0)
Alkaline Phosphatase: 106 U/L (ref 40–150)
Potassium: 3.6 mEq/L (ref 3.5–5.1)
Sodium: 141 mEq/L (ref 136–145)
Total Protein: 7.1 g/dL (ref 6.4–8.3)

## 2012-09-23 NOTE — Progress Notes (Signed)
OFFICE PROGRESS NOTE  CC  Ann Maxwell,SHEILA, MD [address] [place] [address] Dr. Almond Lint  DIAGNOSIS: 72 year old female with 0.5 cm invasive ductal carcinoma of the left breast with DCIS status post mastectomy on 01/04/2012  PRIOR THERAPY:  #1 patient was seen in the multidisciplinary breast clinic in early September 2013 for a 10 cm ductal carcinoma in situ. The tumor was ER positive.  #2 patient has gone on to have a left mastectomy on 01/04/2012. Her final pathology revealed a 0.5 cm invasive ductal carcinoma with associated 9 cm DCIS. Sentinel lymph nodes were negative for metastatic disease. The tumor was ER positive PR positive HER-2/neu negative with Ki-67 17%.  #3 patient will now begin antiestrogen therapy with Arimidex 1 mg daily. Risks and benefits of treatment were discussed with the patient. Patient will not need radiation therapy since she has undergone a mastectomy. A total of 5 years of therapy is planned  #4 iron deficiency anemia on oral iron  CURRENT THERAPY: Arimidex 1 mg daily beginning 01/18/2012  INTERVAL HISTORY: Ann Maxwell 72 y.o. female returns for followup visit today.  She is doing very well.  She was started on Arimidex recently and other than hot flashes, she's been tolerating the medication quite well.  She says the hot flashes are tolerable..  Otherwise she's doing well and w/o questions/concerns.    MEDICAL HISTORY: Past Medical History  Diagnosis Date  . Asthma   . Hypertension   . Osteoarthritis   . Obesity   . Environmental allergies   . Vertigo   . Breast CA 2013    a. L breast DCIS, s/p L mastectomy 12/2011.  Marland Kitchen GERD (gastroesophageal reflux disease)     heartburn occasionally    ALLERGIES:  is allergic to adhesive; oxycodone-acetaminophen; and terramycin.  MEDICATIONS:  Current Outpatient Prescriptions  Medication Sig Dispense Refill  . acetaminophen (TYLENOL) 325 MG tablet Take 650 mg by mouth at bedtime.       Marland Kitchen aspirin EC 81 MG tablet Take 81 mg by mouth daily.      . cholecalciferol (VITAMIN D) 1000 UNITS tablet Take 1,000 Units by mouth daily.      . ferrous sulfate 325 (65 FE) MG tablet Take 1 tablet (325 mg total) by mouth 2 (two) times daily with a meal.  60 tablet  3  . fish oil-omega-3 fatty acids 1000 MG capsule Take 1 g by mouth daily.      . Fluticasone-Salmeterol (ADVAIR) 250-50 MCG/DOSE AEPB Inhale 1 puff into the lungs 2 (two) times daily.      . hydrochlorothiazide (MICROZIDE) 12.5 MG capsule Take 12.5 mg by mouth daily.      Marland Kitchen ipratropium-albuterol (DUONEB) 0.5-2.5 (3) MG/3ML SOLN Take 3 mLs by nebulization every 6 (six) hours as needed. For wheezing      . loratadine (CLARITIN) 10 MG tablet Take 10 mg by mouth daily as needed. For allergies      . losartan-hydrochlorothiazide (HYZAAR) 50-12.5 MG per tablet Take 1 tablet by mouth daily.      . montelukast (SINGULAIR) 10 MG tablet Take 10 mg by mouth daily.       Marland Kitchen RESVERATROL PO Take 1 tablet by mouth daily.      Marland Kitchen anastrozole (ARIMIDEX) 1 MG tablet Take 1 tablet (1 mg total) by mouth daily.  90 tablet  12  . hydrocortisone 2.5 % cream Apply 1 application topically 4 (four) times daily.        No  current facility-administered medications for this visit.    SURGICAL HISTORY:  Past Surgical History  Procedure Laterality Date  . Tonsillectomy and adenoidectomy    . Tvh and bso  1994  . Tibia fracture surgery  1995    and fibular  with int fx  . Cholecystectomy    . Abdominal hysterectomy    . Lithotripsy    . Lithectomy  2010  . Mastectomy w/ sentinel node biopsy  01/04/2012    Procedure: MASTECTOMY WITH SENTINEL LYMPH NODE BIOPSY;  Surgeon: Almond Lint, MD;  Location: [facility] OR;  Service: General;  Laterality: Left;    REVIEW OF SYSTEMS:   General: fatigue (-), night sweats (-), fever (-), pain (-) Lymph: palpable nodes (-) HEENT: vision changes (-), mucositis (-), gum bleeding (-), epistaxis (-) Cardiovascular: chest pain  (-), palpitations (-) Pulmonary: shortness of breath (-), dyspnea on exertion (-), cough (-), hemoptysis (-) GI:  Early satiety (-), melena (-), dysphagia (-), nausea/vomiting (-), diarrhea (-) GU: dysuria (-), hematuria (-), incontinence (-) Musculoskeletal: joint swelling (-), joint pain (-), back pain (-) Neuro: weakness (-), numbness (-), headache (-), confusion (-) Skin: Rash (-), lesions (-), dryness (-) Psych: depression (-), suicidal/homicidal ideation (-), feeling of hopelessness (-)  Health Maintenance Mammogram: 11/2011 Colonoscopy:3-4 years ago, due in 1-2 years Bone Density Scan: 10/13  Pap Smear: s/p TAH/BSO Eye Exam: 1/13 Vitamin D Level: pending Lipid Panel:06/13  PHYSICAL EXAMINATION: Blood pressure 115/71, pulse 104, temperature 98 F (36.7 C), temperature source Oral, resp. rate 20, height 5\' 4"  (1.626 m), weight 230 lb (104.327 kg). Body mass index is 39.46 kg/(m^2). General: Patient is a well appearing female in no acute distress HEENT: PERRLA, sclerae anicteric no conjunctival pallor, MMM Neck: supple, no palpable adenopathy Lungs: clear to auscultation bilaterally, no wheezes, rhonchi, or rales Cardiovascular: regular rate rhythm, S1, S2, no murmurs, rubs or gallops Abdomen: Soft, non-tender, non-distended, normoactive bowel sounds, no HSM Extremities: warm and well perfused, no clubbing, cyanosis, or edema Skin: No rashes or lesions Neuro: Non-focal ECOG PERFORMANCE STATUS: 0 - Asymptomatic Left mastectomy scar is well healed no nodularity no tenderness. Right breast no masses or nipple discharge.     LABORATORY DATA: Lab Results  Component Value Date   WBC 7.4 09/23/2012   HGB 10.6* 09/23/2012   HCT 32.9* 09/23/2012   MCV 69.8* 09/23/2012   PLT 287 09/23/2012      Chemistry      Component Value Date/Time   NA 141 09/23/2012 1028   NA 138 09/13/2012 0405   K 3.6 09/23/2012 1028   K 4.0 09/13/2012 0405   CL 102 09/23/2012 1028   CL 99 09/13/2012 0405   CO2 28  09/23/2012 1028   CO2 29 09/13/2012 0405   BUN 12.9 09/23/2012 1028   BUN 13 09/13/2012 0405   CREATININE 1.0 09/23/2012 1028   CREATININE 0.77 09/13/2012 0405      Component Value Date/Time   CALCIUM 9.8 09/23/2012 1028   CALCIUM 9.4 09/13/2012 0405   ALKPHOS 106 09/23/2012 1028   ALKPHOS 95 09/13/2012 0405   AST 15 09/23/2012 1028   AST 19 09/13/2012 0405   ALT 13 09/23/2012 1028   ALT 10 09/13/2012 0405   BILITOT 0.56 09/23/2012 1028   BILITOT 0.3 09/13/2012 0405     ADDITIONAL INFORMATION: 1. CHROMOGENIC IN-SITU HYBRIDIZATION Interpretation HER-2/NEU BY CISH - NO AMPLIFICATION OF HER-2 DETECTED. THE RATIO OF HER-2: CEP 17 SIGNALS WAS 1.16. Reference range: Ratio: HER2:CEP17 < 1.8 -  gene amplification not observed Ratio: HER2:CEP 17 1.8-2.2 - equivocal result Ratio: HER2:CEP17 > 2.2 - gene amplification observed Pecola Leisure MD Pathologist, Electronic Signature ( Signed 01/11/2012) 7. PROGNOSTIC INDICATORS - ACIS Results IMMUNOHISTOCHEMICAL AND MORPHOMETRIC ANALYSIS BY THE AUTOMATED CELLULAR IMAGING SYSTEM (ACIS) Estrogen Receptor (Negative, <1%): 100%, STRONG STAINING INTENSITY Progesterone Receptor (Negative, <1%): 100%, STRONG STAINING INTENSITY Proliferation Marker Ki67 by M IB-1 (Low<20%): 17% All controls stained appropriately Pecola Leisure MD Pathologist, Electronic Signature ( Signed 01/10/2012) 1 of 4 FINAL for JADALYNN, BURR ([id number]) FINAL DIAGNOSIS Diagnosis 1. Lymph node, sentinel, biopsy, Left axilla #1 - ONE BENIGN LYMPH NODE (0/1). 2. Lymph node, sentinel, biopsy, Left axilla #2 - ONE BENIGN LYMPH NODE (0/1). 3. Lymph node, sentinel, biopsy, Left axillary #3 - ONE BENIGN LYMPH NODE (0/1). 4. Lymph node, sentinel, biopsy, Left axillary #4 - ONE BENIGN LYMPH NODE (0/1). 5. Lymph node, sentinel, biopsy, Left axillary #5 - ONE BENIGN LYMPH NODE (0/1). 6. Lymph node, sentinel, biopsy, Left axillary #6 - ONE BENIGN LYMPH NODE (0/1). 7. Breast, simple mastectomy,  Left - INVASIVE DUCTAL CARCINOMA, 0.5 CM. - HIGH GRADE DUCTAL CARCINOMA IN SITU, 9 CM. - MARGINS NOT INVOLVED. - DCIS 0.3 CM FROM ANTERIOR/INFERIOR MARGIN. Microscopic Comment 7. BREAST, INVASIVE TUMOR, WITH LYMPH NODE SAMPLING Specimen, including laterality: Left breast. Procedure: Simple mastectomy. Grade: II Tubule formation: 3 Nuclear pleomorphism: 2 Mitotic:1 Tumor size (glass slide measurement): 0.5 cm Margins: Free or tumor. Invasive, distance to closest margin: 1.8 cm from deep margin. In-situ, distance to closest margin: 0.3 cm from anterior/inferior margin. If margin positive, focally or broadly: N/A Lymphovascular invasion: Not identified. Ductal carcinoma in situ: Present. Grade: High grade Extensive intraductal component: Yes. Lobular neoplasia: Not identified. Tumor focality: Unifocal Treatment effect: No. If present, treatment effect in breast tissue, lymph nodes or both: N/A Extent of tumor: Skin: Free of tumor. Nipple: Free of tumor. Skeletal muscle: N/A Lymph nodes: # examined: 6 Lymph nodes with metastasis: Isolated tumor cells (< 0.2 mm): 0 Micrometastasis: (> 0.2 mm and < 2.0 mm): 0 Macrometastasis: (> 2.0 mm): 0 Extracapsular extension: N/A Breast prognostic profile: Will be performed on invasive tumor in current specimen. 2 of 4 FINAL for BIANCA, RANERI ([id number]) Microscopic Comment(continued) Estrogen receptor: 100% strong staining, DCIS. Progesterone receptor: 78%, strong staining, DCIS Her 2 neu: Pending. Ki-67: Pending. Non-neoplastic breast: Fibrocystic changes. TNM: pT1a, pN0, pMX Comments: There is a 9 cm area which is involved by high grade DCIS and there is a 0.5 cm focus of associated invasive carcinoma. The margins of the mastectomy are not involved by invasive or in situ carcinoma and focally DCIS is 0.3 cm from the anterior, inferior soft tissue margin. Breast prognostic profile will be performed on the invasive carcinoma.  (JDP:gt, 01/08/12) Jimmy Picket MD Pathologist, Electronic Signature (Case signed 01/08/2012) Specimen Gross and Clinical Information  RADIOGRAPHIC STUDIES:  Dg Chest 2 View  12/29/2011  *RADIOLOGY REPORT*  Clinical Data: Preoperative evaluation.  Hypertension, has been gastroesophageal reflux disease  CHEST - 2 VIEW  Comparison: 02/29/2008  Findings: Heart and mediastinal contours are within normal limits. The lung fields appear clear with no signs of focal infiltrate or congestive failure.  No pleural fluid or significant peribronchial cuffing is seen.  Bony structures are notable for degenerative osteophytosis of the mid and lower thoracic spine.  IMPRESSION: No worrisome focal or acute cardiopulmonary abnormality seen.   Original Report Authenticated By: Bertha Stakes, M.D.    Nm Sentinel Node Inj-no Rpt (breast)  01/04/2012  CLINICAL DATA: left breast cancer   Sulfur colloid was injected intradermally by the nuclear medicine  technologist for breast cancer sentinel node localization.      ASSESSMENT: 72 year old female with  #1 invasive ductal carcinoma of the left breast status post mastectomy. Her final pathology revealed a 0.5 cm invasive ductal carcinoma with associated DCIS that measured 9 cm. All sentinel node lymph nodes were negative for metastatic disease. Tumor was ER +100% PR +100% HER-2/neu negative with Ki-67 of 7%. Postoperatively patient is doing well and is without any significant complaints.  #2 patient is a good candidate for adjuvant antiestrogen therapy consisting of arimdex1 milligram daily. We have discussed risks and benefits of the treatment in detail and literature was given to them. Prescription was sent to her pharmacy.  #3 iron deficiency anemia: Patient was admitted for severe iron deficiency she received 2 units of packed red cells. She has also had a GI workup. Her GI workup was negative. In the meantime she is on oral iron I have encouraged her to  continue taking this.   PLAN:  #1 patient will resume Arimidex 1 mg daily. She had discontinued this during her hospitalization.I have tried to reassure her that her iron deficiency anemia is not too to this medicine.  #2 we will check her iron studies in 6 months time.  #3 she will spacing back in 6 months for followup.  All questions were answered. The patient knows to call the clinic with any problems, questions or concerns. We can certainly see the patient much sooner if necessary.  I spent 25 minutes counseling the patient face to face. The total time spent in the appointment was 30 minutes.  Drue Second, MD Medical/Oncology [facility] [phone] (beeper) [phone] (Office)  09/23/2012, 11:28 AM

## 2012-09-23 NOTE — Telephone Encounter (Signed)
, °

## 2012-09-23 NOTE — Patient Instructions (Addendum)
#  1 resume Arimidex 1 mg daily.  #2 continue taking oral iron as recommended for iron deficiency anemia.  #3 I will see you back in 6 months time at which time we will check iron studies as well as a CBC and a complete metabolic profile.

## 2012-09-24 LAB — FERRITIN: Ferritin: 48 ng/mL (ref 10–291)

## 2012-09-24 LAB — IRON AND TIBC: %SAT: 67 % — ABNORMAL HIGH (ref 20–55)

## 2012-10-09 ENCOUNTER — Encounter (INDEPENDENT_AMBULATORY_CARE_PROVIDER_SITE_OTHER): Payer: Self-pay | Admitting: General Surgery

## 2012-11-12 ENCOUNTER — Ambulatory Visit (INDEPENDENT_AMBULATORY_CARE_PROVIDER_SITE_OTHER): Payer: Medicare PPO | Admitting: General Surgery

## 2012-11-12 ENCOUNTER — Telehealth (INDEPENDENT_AMBULATORY_CARE_PROVIDER_SITE_OTHER): Payer: Self-pay | Admitting: General Surgery

## 2012-11-12 ENCOUNTER — Other Ambulatory Visit (INDEPENDENT_AMBULATORY_CARE_PROVIDER_SITE_OTHER): Payer: Self-pay | Admitting: General Surgery

## 2012-11-12 ENCOUNTER — Encounter (INDEPENDENT_AMBULATORY_CARE_PROVIDER_SITE_OTHER): Payer: Self-pay | Admitting: General Surgery

## 2012-11-12 VITALS — BP 118/68 | HR 110 | Temp 97.7°F | Ht 64.5 in | Wt 225.2 lb

## 2012-11-12 DIAGNOSIS — C50519 Malignant neoplasm of lower-outer quadrant of unspecified female breast: Secondary | ICD-10-CM

## 2012-11-12 DIAGNOSIS — D0512 Intraductal carcinoma in situ of left breast: Secondary | ICD-10-CM

## 2012-11-12 DIAGNOSIS — C50512 Malignant neoplasm of lower-outer quadrant of left female breast: Secondary | ICD-10-CM

## 2012-11-12 NOTE — Progress Notes (Signed)
HISTORY: Patient is s/p L mastectomy in September of 2013. She had a 10 cm area of DCIS that would not have been amenable to breast conservation. She does have some excess left axillary breast tissue.  She went to physical therapy since I last saw her for left shoulder bursitis.  She developed right-sided shoulder pain as well. The stretches and physical therapy got her mobility back normal.  She denies any breast pain or skin dimpling. She has not felt any new masses. She is on her anti-estrogen therapy and does have hot flashes. Since I saw her last, she required hospitalization for anemia. Her hemoglobin was down to 7. She did get a blood transfusion. She has had a recent colonoscopy so she did not get another one of those. She is feeling much better and her hemoglobin is back up over 10.   PERTINENT REVIEW OF SYSTEMS: Otherwise negative x 11.      EXAM: Head: Normocephalic and atraumatic.  Eyes:  Conjunctivae are normal. Pupils are equal, round, and reactive to light. No scleral icterus.  Neck:  Normal range of motion. Neck supple. No tracheal deviation present. No thyromegaly present.  Resp: No respiratory distress, normal effort. Breast:  No palpable masses in the right breast. No nipple retraction or skin dimpling.   Very dense tissue out of proportion for age.  She has no axillary lymphadenopathy.  No palpable chest wall masses on the left.  Excess axillary tissue on left.  Abd:  Abdomen is soft, non distended and non tender. No masses are palpable.  There is no rebound and no guarding.  Neurological: Alert and oriented to person, place, and time. Coordination normal.  Skin: Skin is warm and dry. No rash noted. No diaphoretic. No erythema. No pallor.  Psychiatric: Normal mood and affect. Normal behavior. Judgment and thought content normal.      ASSESSMENT AND PLAN:   left breast DCIS 10 cm, s/p Left mastectomy 12/2011 The patient currently NED.  She is due for her right mammogram.  She does have very dense breasts. As long as her mammogram is okay, I will see her back in 6 months. She has follow up with Dr. Welton Flakes in December.  She is continuing on the Arimedex.  She has some hot flashes, but at a manageable level.       Maudry Diego, MD Surgical Oncology, General & Endocrine Surgery [facility] Surgery, Colin Benton, MD No ref. provider found

## 2012-11-12 NOTE — Telephone Encounter (Signed)
Spoke with pt to inform her that she has an appt w/ [facility] on 11/28/12 at 1:45 for her annual Rt MGM

## 2012-11-12 NOTE — Patient Instructions (Addendum)
Get mammogram in August.    Bernie will schedule.    Follow up with me in 6 months.

## 2012-11-12 NOTE — Assessment & Plan Note (Signed)
The patient currently NED.  She is due for her right mammogram. She does have very dense breasts. As long as her mammogram is okay, I will see her back in 6 months. She has follow up with Dr. Welton Flakes in December.  She is continuing on the Arimedex.  She has some hot flashes, but at a manageable level.

## 2012-11-20 ENCOUNTER — Other Ambulatory Visit: Payer: Self-pay

## 2012-11-24 ENCOUNTER — Encounter: Payer: Self-pay | Admitting: Adult Health

## 2012-11-28 ENCOUNTER — Ambulatory Visit
Admission: RE | Admit: 2012-11-28 | Discharge: 2012-11-28 | Disposition: A | Discharge status: Home / Self Care | Payer: Medicare PPO | Source: Ambulatory Visit | Attending: General Surgery | Admitting: General Surgery

## 2012-11-28 DIAGNOSIS — D0512 Intraductal carcinoma in situ of left breast: Secondary | ICD-10-CM

## 2013-02-20 ENCOUNTER — Other Ambulatory Visit: Payer: Self-pay

## 2013-03-25 ENCOUNTER — Telehealth: Payer: Self-pay | Admitting: Genetic Counselor

## 2013-03-25 NOTE — Telephone Encounter (Signed)
I called Ann Maxwell and spoke with Ann Maxwell.  He stated that they are still going back and forth with [employer] but that ultimately Ann Maxwell will not owe anything OOP.

## 2013-03-26 ENCOUNTER — Other Ambulatory Visit: Payer: Self-pay | Admitting: Oncology

## 2013-03-31 ENCOUNTER — Ambulatory Visit (HOSPITAL_BASED_OUTPATIENT_CLINIC_OR_DEPARTMENT_OTHER): Payer: Medicare PPO | Admitting: Oncology

## 2013-03-31 ENCOUNTER — Encounter: Payer: Self-pay | Admitting: Oncology

## 2013-03-31 ENCOUNTER — Other Ambulatory Visit (HOSPITAL_BASED_OUTPATIENT_CLINIC_OR_DEPARTMENT_OTHER): Payer: Medicare PPO

## 2013-03-31 ENCOUNTER — Telehealth: Payer: Self-pay | Admitting: Oncology

## 2013-03-31 VITALS — BP 138/68 | HR 74 | Temp 98.4°F | Resp 20 | Ht 64.5 in | Wt 216.2 lb

## 2013-03-31 DIAGNOSIS — Z9010 Acquired absence of unspecified breast and nipple: Secondary | ICD-10-CM

## 2013-03-31 DIAGNOSIS — C50519 Malignant neoplasm of lower-outer quadrant of unspecified female breast: Secondary | ICD-10-CM

## 2013-03-31 DIAGNOSIS — C50512 Malignant neoplasm of lower-outer quadrant of left female breast: Secondary | ICD-10-CM

## 2013-03-31 DIAGNOSIS — D509 Iron deficiency anemia, unspecified: Secondary | ICD-10-CM

## 2013-03-31 DIAGNOSIS — E559 Vitamin D deficiency, unspecified: Secondary | ICD-10-CM

## 2013-03-31 DIAGNOSIS — Z170 Estrogen receptor positive status [ER+]: Secondary | ICD-10-CM

## 2013-03-31 LAB — CBC WITH DIFFERENTIAL/PLATELET
BASO%: 1 % (ref 0.0–2.0)
EOS%: 3.9 % (ref 0.0–7.0)
Eosinophils Absolute: 0.3 10*3/uL (ref 0.0–0.5)
HCT: 32.2 % — ABNORMAL LOW (ref 34.8–46.6)
HGB: 10.4 g/dL — ABNORMAL LOW (ref 11.6–15.9)
LYMPH%: 11.4 % — ABNORMAL LOW (ref 14.0–49.7)
MCH: 27.6 pg (ref 25.1–34.0)
MCHC: 32.2 g/dL (ref 31.5–36.0)
MONO%: 7.8 % (ref 0.0–14.0)
NEUT%: 75.9 % (ref 38.4–76.8)
Platelets: 336 10*3/uL (ref 145–400)

## 2013-03-31 LAB — COMPREHENSIVE METABOLIC PANEL (CC13)
Albumin: 3.6 g/dL (ref 3.5–5.0)
BUN: 14.5 mg/dL (ref 7.0–26.0)
Calcium: 9.8 mg/dL (ref 8.4–10.4)
Chloride: 104 mEq/L (ref 98–109)
Glucose: 103 mg/dl (ref 70–140)
Potassium: 3.9 mEq/L (ref 3.5–5.1)

## 2013-03-31 LAB — IRON AND TIBC CHCC: Iron: 177 ug/dL — ABNORMAL HIGH (ref 41–142)

## 2013-03-31 NOTE — Telephone Encounter (Signed)
, °

## 2013-03-31 NOTE — Progress Notes (Signed)
OFFICE PROGRESS NOTE  CC  STALLINGS,SHEILA, MD [address] [place] [address] Dr. Almond Lint  DIAGNOSIS: 72 year old female with 0.5 cm invasive ductal carcinoma of the left breast with DCIS status post mastectomy on 01/04/2012  PRIOR THERAPY:  #1 patient was seen in the multidisciplinary breast clinic in early September 2013 for a 10 cm ductal carcinoma in situ. The tumor was ER positive.  #2 patient has gone on to have a left mastectomy on 01/04/2012. Her final pathology revealed a 0.5 cm invasive ductal carcinoma with associated 9 cm DCIS. Sentinel lymph nodes were negative for metastatic disease. The tumor was ER positive PR positive HER-2/neu negative with Ki-67 17%.  #3 patient will now begin antiestrogen therapy with Arimidex 1 mg daily. Risks and benefits of treatment were discussed with the patient. Patient will not need radiation therapy since she has undergone a mastectomy. A total of 5 years of therapy is planned  #4 iron deficiency anemia on oral iron  CURRENT THERAPY: Arimidex 1 mg daily beginning 01/18/2012  INTERVAL HISTORY: Ann Maxwell 72 y.o. female returns for followup visit today.  She is doing very well.  She has been on Arimidexand other than hot flashes, she's been tolerating the medication quite well.  She says the hot flashes are tolerable..  Otherwise she's doing well and w/o questions/concerns.    MEDICAL HISTORY: Past Medical History  Diagnosis Date  . Asthma   . Hypertension   . Osteoarthritis   . Obesity   . Environmental allergies   . Vertigo   . Breast CA 2013    a. L breast DCIS, s/p L mastectomy 12/2011.  Marland Kitchen GERD (gastroesophageal reflux disease)     heartburn occasionally  . Anemia     ALLERGIES:  is allergic to adhesive; oxycodone-acetaminophen; and terramycin.  MEDICATIONS:  Current Outpatient Prescriptions  Medication Sig Dispense Refill  . acetaminophen (TYLENOL) 325 MG tablet Take 650 mg by mouth at bedtime.       Marland Kitchen anastrozole (ARIMIDEX) 1 MG tablet TAKE ONE TABLET BY MOUTH EVERY DAY  90 tablet  6  . aspirin EC 81 MG tablet Take 81 mg by mouth daily.      . cholecalciferol (VITAMIN D) 1000 UNITS tablet Take 1,000 Units by mouth daily.      . ferrous sulfate 325 (65 FE) MG tablet Take 1 tablet (325 mg total) by mouth 2 (two) times daily with a meal.  60 tablet  3  . fish oil-omega-3 fatty acids 1000 MG capsule Take 1 g by mouth daily.      . Fluticasone-Salmeterol (ADVAIR) 250-50 MCG/DOSE AEPB Inhale 1 puff into the lungs 2 (two) times daily.      . hydrochlorothiazide (MICROZIDE) 12.5 MG capsule Take 12.5 mg by mouth daily.      . hydrocortisone 2.5 % cream Apply 1 application topically 4 (four) times daily.       Marland Kitchen ipratropium-albuterol (DUONEB) 0.5-2.5 (3) MG/3ML SOLN Take 3 mLs by nebulization every 6 (six) hours as needed. For wheezing      . loratadine (CLARITIN) 10 MG tablet Take 10 mg by mouth daily as needed. For allergies      . losartan-hydrochlorothiazide (HYZAAR) 50-12.5 MG per tablet Take 1 tablet by mouth daily.      . montelukast (SINGULAIR) 10 MG tablet Take 10 mg by mouth daily.       Marland Kitchen RESVERATROL PO Take 1 tablet by mouth daily.       No  current facility-administered medications for this visit.    SURGICAL HISTORY:  Past Surgical History  Procedure Laterality Date  . Tonsillectomy and adenoidectomy    . Tvh and bso  1994  . Tibia fracture surgery  1995    and fibular  with int fx  . Cholecystectomy    . Abdominal hysterectomy    . Lithotripsy    . Lithectomy  2010  . Mastectomy w/ sentinel node biopsy  01/04/2012    Procedure: MASTECTOMY WITH SENTINEL LYMPH NODE BIOPSY;  Surgeon: Almond Lint, MD;  Location: [facility] OR;  Service: General;  Laterality: Left;  . Breast surgery      REVIEW OF SYSTEMS:   General: fatigue (-), night sweats (-), fever (-), pain (-) Lymph: palpable nodes (-) HEENT: vision changes (-), mucositis (-), gum bleeding (-), epistaxis (-) Cardiovascular:  chest pain (-), palpitations (-) Pulmonary: shortness of breath (-), dyspnea on exertion (-), cough (-), hemoptysis (-) GI:  Early satiety (-), melena (-), dysphagia (-), nausea/vomiting (-), diarrhea (-) GU: dysuria (-), hematuria (-), incontinence (-) Musculoskeletal: joint swelling (-), joint pain (-), back pain (-) Neuro: weakness (-), numbness (-), headache (-), confusion (-) Skin: Rash (-), lesions (-), dryness (-) Psych: depression (-), suicidal/homicidal ideation (-), feeling of hopelessness (-)  Health Maintenance Mammogram: 11/2011 Colonoscopy:3-4 years ago, due in 1-2 years Bone Density Scan: 10/13  Pap Smear: s/p TAH/BSO Eye Exam: 1/13 Vitamin D Level: pending Lipid Panel:06/13  PHYSICAL EXAMINATION: There were no vitals taken for this visit. There is no weight on file to calculate BMI. General: Patient is a well appearing female in no acute distress HEENT: PERRLA, sclerae anicteric no conjunctival pallor, MMM Neck: supple, no palpable adenopathy Lungs: clear to auscultation bilaterally, no wheezes, rhonchi, or rales Cardiovascular: regular rate rhythm, S1, S2, no murmurs, rubs or gallops Abdomen: Soft, non-tender, non-distended, normoactive bowel sounds, no HSM Extremities: warm and well perfused, no clubbing, cyanosis, or edema Skin: No rashes or lesions Neuro: Non-focal ECOG PERFORMANCE STATUS: 0 - Asymptomatic Left mastectomy scar is well healed no nodularity no tenderness. Right breast no masses or nipple discharge.     LABORATORY DATA: Lab Results  Component Value Date   WBC 7.1 03/31/2013   HGB 10.4* 03/31/2013   HCT 32.2* 03/31/2013   MCV 85.9 03/31/2013   PLT 336 03/31/2013      Chemistry      Component Value Date/Time   NA 141 09/23/2012 1028   NA 138 09/13/2012 0405   K 3.6 09/23/2012 1028   K 4.0 09/13/2012 0405   CL 102 09/23/2012 1028   CL 99 09/13/2012 0405   CO2 28 09/23/2012 1028   CO2 29 09/13/2012 0405   BUN 12.9 09/23/2012 1028   BUN 13  09/13/2012 0405   CREATININE 1.0 09/23/2012 1028   CREATININE 0.77 09/13/2012 0405      Component Value Date/Time   CALCIUM 9.8 09/23/2012 1028   CALCIUM 9.4 09/13/2012 0405   ALKPHOS 106 09/23/2012 1028   ALKPHOS 95 09/13/2012 0405   AST 15 09/23/2012 1028   AST 19 09/13/2012 0405   ALT 13 09/23/2012 1028   ALT 10 09/13/2012 0405   BILITOT 0.56 09/23/2012 1028   BILITOT 0.3 09/13/2012 0405     ADDITIONAL INFORMATION: 1. CHROMOGENIC IN-SITU HYBRIDIZATION Interpretation HER-2/NEU BY CISH - NO AMPLIFICATION OF HER-2 DETECTED. THE RATIO OF HER-2: CEP 17 SIGNALS WAS 1.16. Reference range: Ratio: HER2:CEP17 < 1.8 - gene amplification not observed Ratio: HER2:CEP 17 1.8-2.2 -  equivocal result Ratio: HER2:CEP17 > 2.2 - gene amplification observed Pecola Leisure MD Pathologist, Electronic Signature ( Signed 01/11/2012) 7. PROGNOSTIC INDICATORS - ACIS Results IMMUNOHISTOCHEMICAL AND MORPHOMETRIC ANALYSIS BY THE AUTOMATED CELLULAR IMAGING SYSTEM (ACIS) Estrogen Receptor (Negative, <1%): 100%, STRONG STAINING INTENSITY Progesterone Receptor (Negative, <1%): 100%, STRONG STAINING INTENSITY Proliferation Marker Ki67 by M IB-1 (Low<20%): 17% All controls stained appropriately Pecola Leisure MD Pathologist, Electronic Signature ( Signed 01/10/2012) 1 of 4 FINAL for Ann Maxwell, Ann Maxwell ([id number]) FINAL DIAGNOSIS Diagnosis 1. Lymph node, sentinel, biopsy, Left axilla #1 - ONE BENIGN LYMPH NODE (0/1). 2. Lymph node, sentinel, biopsy, Left axilla #2 - ONE BENIGN LYMPH NODE (0/1). 3. Lymph node, sentinel, biopsy, Left axillary #3 - ONE BENIGN LYMPH NODE (0/1). 4. Lymph node, sentinel, biopsy, Left axillary #4 - ONE BENIGN LYMPH NODE (0/1). 5. Lymph node, sentinel, biopsy, Left axillary #5 - ONE BENIGN LYMPH NODE (0/1). 6. Lymph node, sentinel, biopsy, Left axillary #6 - ONE BENIGN LYMPH NODE (0/1). 7. Breast, simple mastectomy, Left - INVASIVE DUCTAL CARCINOMA, 0.5 CM. - HIGH GRADE DUCTAL CARCINOMA IN  SITU, 9 CM. - MARGINS NOT INVOLVED. - DCIS 0.3 CM FROM ANTERIOR/INFERIOR MARGIN. Microscopic Comment 7. BREAST, INVASIVE TUMOR, WITH LYMPH NODE SAMPLING Specimen, including laterality: Left breast. Procedure: Simple mastectomy. Grade: II Tubule formation: 3 Nuclear pleomorphism: 2 Mitotic:1 Tumor size (glass slide measurement): 0.5 cm Margins: Free or tumor. Invasive, distance to closest margin: 1.8 cm from deep margin. In-situ, distance to closest margin: 0.3 cm from anterior/inferior margin. If margin positive, focally or broadly: N/A Lymphovascular invasion: Not identified. Ductal carcinoma in situ: Present. Grade: High grade Extensive intraductal component: Yes. Lobular neoplasia: Not identified. Tumor focality: Unifocal Treatment effect: No. If present, treatment effect in breast tissue, lymph nodes or both: N/A Extent of tumor: Skin: Free of tumor. Nipple: Free of tumor. Skeletal muscle: N/A Lymph nodes: # examined: 6 Lymph nodes with metastasis: Isolated tumor cells (< 0.2 mm): 0 Micrometastasis: (> 0.2 mm and < 2.0 mm): 0 Macrometastasis: (> 2.0 mm): 0 Extracapsular extension: N/A Breast prognostic profile: Will be performed on invasive tumor in current specimen. 2 of 4 FINAL for Ann Maxwell, Ann Maxwell ([id number]) Microscopic Comment(continued) Estrogen receptor: 100% strong staining, DCIS. Progesterone receptor: 78%, strong staining, DCIS Her 2 neu: Pending. Ki-67: Pending. Non-neoplastic breast: Fibrocystic changes. TNM: pT1a, pN0, pMX Comments: There is a 9 cm area which is involved by high grade DCIS and there is a 0.5 cm focus of associated invasive carcinoma. The margins of the mastectomy are not involved by invasive or in situ carcinoma and focally DCIS is 0.3 cm from the anterior, inferior soft tissue margin. Breast prognostic profile will be performed on the invasive carcinoma. (JDP:gt, 01/08/12) Jimmy Picket MD Pathologist, Electronic Signature (Case  signed 01/08/2012) Specimen Gross and Clinical Information  RADIOGRAPHIC STUDIES:  Dg Chest 2 View  12/29/2011  *RADIOLOGY REPORT*  Clinical Data: Preoperative evaluation.  Hypertension, has been gastroesophageal reflux disease  CHEST - 2 VIEW  Comparison: 02/29/2008  Findings: Heart and mediastinal contours are within normal limits. The lung fields appear clear with no signs of focal infiltrate or congestive failure.  No pleural fluid or significant peribronchial cuffing is seen.  Bony structures are notable for degenerative osteophytosis of the mid and lower thoracic spine.  IMPRESSION: No worrisome focal or acute cardiopulmonary abnormality seen.   Original Report Authenticated By: Bertha Stakes, M.D.    Nm Sentinel Node Inj-no Rpt (breast)  01/04/2012  CLINICAL DATA: left breast cancer  Sulfur colloid was injected intradermally by the nuclear medicine  technologist for breast cancer sentinel node localization.      ASSESSMENT: 72 year old female with  #1 invasive ductal carcinoma of the left breast status post mastectomy. Her final pathology revealed a 0.5 cm invasive ductal carcinoma with associated DCIS that measured 9 cm. All sentinel node lymph nodes were negative for metastatic disease. Tumor was ER +100% PR +100% HER-2/neu negative with Ki-67 of 7%. Postoperatively patient is doing well and is without any significant complaints.  #2 patient is a good candidate for adjuvant antiestrogen therapy consisting of arimdex1 milligram daily. We have discussed risks and benefits of the treatment in detail and literature was given to them. Prescription was sent to her pharmacy.  #3 iron deficiency anemia: Patient was admitted for severe iron deficiency she received 2 units of packed red cells. She has also had a GI workup. Her GI workup was negative. In the meantime she is on oral iron I have encouraged her to continue taking this.   PLAN:  #1continueArimidex 1 mg daily.   #2 we will  check her iron studies in 6 months time.  #3 she will spacing back in 6 months for followup.  All questions were answered. The patient knows to call the clinic with any problems, questions or concerns. We can certainly see the patient much sooner if necessary.  I spent 25 minutes counseling the patient face to face. The total time spent in the appointment was 30 minutes.  Drue Second, MD Medical/Oncology [facility] [phone] (beeper) [phone] (Office)  03/31/2013, 10:40 AM

## 2013-04-01 ENCOUNTER — Encounter (INDEPENDENT_AMBULATORY_CARE_PROVIDER_SITE_OTHER): Payer: Self-pay | Admitting: General Surgery

## 2013-05-05 ENCOUNTER — Encounter (INDEPENDENT_AMBULATORY_CARE_PROVIDER_SITE_OTHER): Payer: Self-pay | Admitting: General Surgery

## 2013-06-13 ENCOUNTER — Encounter (INDEPENDENT_AMBULATORY_CARE_PROVIDER_SITE_OTHER): Payer: Self-pay | Admitting: General Surgery

## 2013-06-13 ENCOUNTER — Ambulatory Visit (INDEPENDENT_AMBULATORY_CARE_PROVIDER_SITE_OTHER): Payer: Medicare PPO | Admitting: General Surgery

## 2013-06-13 VITALS — BP 130/74 | HR 82 | Resp 14 | Ht 64.5 in | Wt 213.4 lb

## 2013-06-13 DIAGNOSIS — C50519 Malignant neoplasm of lower-outer quadrant of unspecified female breast: Secondary | ICD-10-CM

## 2013-06-13 NOTE — Assessment & Plan Note (Signed)
Pt has no clinical evidence of disease. She will continue arimedex.    Mammogram due in July/august.  I will see her back in 6 months, then move to yearly follow up.

## 2013-06-13 NOTE — Patient Instructions (Signed)
Follow up with me in 6 months.  Mammo this summer.  Call for problems.

## 2013-06-13 NOTE — Progress Notes (Signed)
HISTORY: Patient is s/p L mastectomy in September of 2013. She had a 10 cm area of DCIS that would not have been amenable to breast conservation. Pt continues to do well.  Her shoulder mobility remains good.  She is tolerating Arimedex better with fewer hot flashes.  She has not developed any new health problems.  Pt's PCP is leaving, so she will need a new one.     PERTINENT REVIEW OF SYSTEMS: Otherwise negative x 11.      EXAM: unchanged from last visit. Head: Normocephalic and atraumatic.  Eyes:  Conjunctivae are normal. Pupils are equal, round, and reactive to light. No scleral icterus.  Neck:  Normal range of motion. Neck supple. No tracheal deviation present. No thyromegaly present.  Resp: No respiratory distress, normal effort. Breast:  No palpable masses in the right breast. No nipple retraction or skin dimpling.   Very dense tissue out of proportion for age.  She has no axillary lymphadenopathy.  No palpable chest wall masses on the left.  Excess axillary tissue on left.  Abd:  Abdomen is soft, non distended and non tender. No masses are palpable.  There is no rebound and no guarding.  Neurological: Alert and oriented to person, place, and time. Coordination normal.  Skin: Skin is warm and dry. No rash noted. No diaphoretic. No erythema. No pallor.  Psychiatric: Normal mood and affect. Normal behavior. Judgment and thought content normal.      ASSESSMENT AND PLAN:   left breast DCIS 10 cm, s/p Left mastectomy 12/2011 Pt has no clinical evidence of disease. She will continue arimedex.    Mammogram due in July/august.  I will see her back in 6 months, then move to yearly follow up.       Milus Height, MD Surgical Oncology, [facility], Suszanne Finch, MD Herbert Pun., MD

## 2013-06-19 ENCOUNTER — Telehealth: Payer: Self-pay | Admitting: *Deleted

## 2013-06-19 NOTE — Telephone Encounter (Signed)
Received a voice mail from [employer] at [facility] [place] regarding this mutual patient. Labs drawn at [facility] and faxed to [facility]. MD at [facility] suggest a referral to Hematology. Faxed labs given to MD for review.

## 2013-06-20 ENCOUNTER — Other Ambulatory Visit: Payer: Self-pay | Admitting: Oncology

## 2013-06-20 ENCOUNTER — Telehealth: Payer: Self-pay

## 2013-06-20 DIAGNOSIS — D638 Anemia in other chronic diseases classified elsewhere: Secondary | ICD-10-CM

## 2013-06-20 DIAGNOSIS — D539 Nutritional anemia, unspecified: Secondary | ICD-10-CM

## 2013-06-20 NOTE — Telephone Encounter (Signed)
Amber is calling to follow up on request for an appointment with Dr. Humphrey Rolls made on Wednesday 06-18-13.  Office phone is [phone]. Ms. Dicocco has called Dr. Kaleen Mask office to see if appointment with Dr. Humphrey Rolls has been scheduled. Forwarded  this message to Dr. Laurelyn Sickle nurse.

## 2013-06-24 ENCOUNTER — Other Ambulatory Visit: Payer: Self-pay | Admitting: *Deleted

## 2013-06-25 ENCOUNTER — Telehealth: Payer: Self-pay

## 2013-06-25 ENCOUNTER — Telehealth: Payer: Self-pay | Admitting: Oncology

## 2013-06-25 NOTE — Telephone Encounter (Signed)
Patients wants to be seen prior to 4/21 & is ok to see LC.  Per KK - ok to see Ethel.  POF sent.

## 2013-06-25 NOTE — Telephone Encounter (Signed)
, °

## 2013-06-26 ENCOUNTER — Telehealth: Payer: Self-pay | Admitting: Oncology

## 2013-06-26 NOTE — Telephone Encounter (Signed)
lvm for pt regarding to 3.17.15 appt...Marland KitchenMarland Kitchen

## 2013-07-01 ENCOUNTER — Telehealth: Payer: Self-pay | Admitting: Adult Health

## 2013-07-01 ENCOUNTER — Ambulatory Visit (HOSPITAL_BASED_OUTPATIENT_CLINIC_OR_DEPARTMENT_OTHER): Payer: Medicare PPO | Admitting: Adult Health

## 2013-07-01 ENCOUNTER — Telehealth: Payer: Self-pay | Admitting: *Deleted

## 2013-07-01 ENCOUNTER — Encounter: Payer: Self-pay | Admitting: Adult Health

## 2013-07-01 ENCOUNTER — Telehealth: Payer: Self-pay | Admitting: Oncology

## 2013-07-01 VITALS — BP 128/85 | HR 89 | Temp 98.0°F | Resp 20 | Ht 64.5 in | Wt 214.9 lb

## 2013-07-01 DIAGNOSIS — D500 Iron deficiency anemia secondary to blood loss (chronic): Secondary | ICD-10-CM | POA: Insufficient documentation

## 2013-07-01 DIAGNOSIS — D509 Iron deficiency anemia, unspecified: Secondary | ICD-10-CM

## 2013-07-01 DIAGNOSIS — C50519 Malignant neoplasm of lower-outer quadrant of unspecified female breast: Secondary | ICD-10-CM

## 2013-07-01 DIAGNOSIS — Z170 Estrogen receptor positive status [ER+]: Secondary | ICD-10-CM

## 2013-07-01 NOTE — Patient Instructions (Signed)

## 2013-07-01 NOTE — Telephone Encounter (Signed)
Per staff message and POF I have scheduled appts.  JMW  

## 2013-07-01 NOTE — Telephone Encounter (Signed)
, °

## 2013-07-01 NOTE — Progress Notes (Signed)
OFFICE PROGRESS NOTE  CC  STALLINGS,SHEILA, MD [address] [place] [address] Dr. Stark Klein  DIAGNOSIS: 73 year old female with 0.5 cm invasive ductal carcinoma of the left breast with DCIS status post mastectomy on 01/04/2012  PRIOR THERAPY:  #1 patient was seen in the multidisciplinary breast clinic in early September 2013 for a 10 cm ductal carcinoma in situ. The tumor was ER positive.  #2 patient has gone on to have a left mastectomy on 01/04/2012. Her final pathology revealed a 0.5 cm invasive ductal carcinoma with associated 9 cm DCIS. Sentinel lymph nodes were negative for metastatic disease. The tumor was ER positive PR positive HER-2/neu negative with Ki-67 17%.  #3 patient will now begin antiestrogen therapy with Arimidex 1 mg daily. Risks and benefits of treatment were discussed with the patient. Patient will not need radiation therapy since she has undergone a mastectomy. A total of 5 years of therapy is planned  #4 iron deficiency anemia on oral iron  CURRENT THERAPY: Arimidex 1 mg daily beginning 01/18/2012  INTERVAL HISTORY: Ann Maxwell 73 y.o. female returns for followup visit today of her iron deficiency anemia.  She has been increasingly fatigued, and has started craving ice again.  She went to her PCP and found that her ferritin, iron levels, and hemoglobin was decreased.  At her last appointment she was exercising daily and hasn't been able to do so.  She continues to take Letrozole daily and is tolerating that well.  She was taking iron BID, and was decreased to daily a few months ago at her last appointment.  She denies fevers, chills, night sweats, pain, hot flashes, joint pain, or any further concerns.    MEDICAL HISTORY: Past Medical History  Diagnosis Date  . Asthma   . Hypertension   . Osteoarthritis   . Obesity   . Environmental allergies   . Vertigo   . Breast CA 2013    a. L breast DCIS, s/p L mastectomy 12/2011.  Marland Kitchen GERD  (gastroesophageal reflux disease)     heartburn occasionally  . Anemia     ALLERGIES:  is allergic to adhesive; oxycodone-acetaminophen; and terramycin.  MEDICATIONS:  Current Outpatient Prescriptions  Medication Sig Dispense Refill  . anastrozole (ARIMIDEX) 1 MG tablet TAKE ONE TABLET BY MOUTH EVERY DAY  90 tablet  6  . aspirin EC 81 MG tablet Take 81 mg by mouth daily.      . B Complex-C (SUPER B COMPLEX PO) Take 1 capsule by mouth every morning.      . cholecalciferol (VITAMIN D) 1000 UNITS tablet Take 1,000 Units by mouth daily.      . ferrous sulfate 325 (65 FE) MG tablet Take 325 mg by mouth 2 (two) times daily with a meal. For two weeks been taking 2 a day      . fish oil-omega-3 fatty acids 1000 MG capsule Take 1 g by mouth daily.      . Fluticasone-Salmeterol (ADVAIR) 250-50 MCG/DOSE AEPB Inhale 1 puff into the lungs 2 (two) times daily.      . hydrochlorothiazide (MICROZIDE) 12.5 MG capsule Take 12.5 mg by mouth daily.      Marland Kitchen loratadine (CLARITIN) 10 MG tablet Take 10 mg by mouth daily as needed. For allergies      . losartan-hydrochlorothiazide (HYZAAR) 50-12.5 MG per tablet Take 1 tablet by mouth daily.      . montelukast (SINGULAIR) 10 MG tablet Take 10 mg by mouth daily.       Marland Kitchen  RESVERATROL PO Take 1 tablet by mouth daily.      . vitamin E 200 UNIT capsule Take 200 Units by mouth 2 (two) times daily.      Marland Kitchen acetaminophen (TYLENOL) 325 MG tablet Take 650 mg by mouth at bedtime.      . hydrocortisone 2.5 % cream Apply 1 application topically 4 (four) times daily.       Marland Kitchen ipratropium-albuterol (DUONEB) 0.5-2.5 (3) MG/3ML SOLN Take 3 mLs by nebulization every 6 (six) hours as needed. For wheezing      . PROAIR HFA 108 (90 BASE) MCG/ACT inhaler        No current facility-administered medications for this visit.    SURGICAL HISTORY:  Past Surgical History  Procedure Laterality Date  . Tonsillectomy and adenoidectomy    . Tvh and bso  1994  . Tibia fracture surgery  1995     and fibular  with int fx  . Cholecystectomy    . Abdominal hysterectomy    . Lithotripsy    . Lithectomy  2010  . Mastectomy w/ sentinel node biopsy  01/04/2012    Procedure: MASTECTOMY WITH SENTINEL LYMPH NODE BIOPSY;  Surgeon: Stark Klein, MD;  Location: [facility];  Service: General;  Laterality: Left;  . Breast surgery      REVIEW OF SYSTEMS:   A 10 point review of systems was conducted and is otherwise negative except for what is noted above.     Health Maintenance Mammogram: 11/2012 Colonoscopy:3-4 years ago, due in 1-2 years Bone Density Scan: 10/13  Pap Smear: s/p TAH/BSO Eye Exam: 1/13 Vitamin D Level: pending Lipid Panel:06/13  PHYSICAL EXAMINATION: Blood pressure 128/85, pulse 89, temperature 98 F (36.7 C), temperature source Oral, resp. rate 20, height 5' 4.5" (1.638 m), weight 214 lb 14.4 oz (97.478 kg). Body mass index is 36.33 kg/(m^2). GENERAL: Patient is a well appearing female in no acute distress HEENT:  Sclerae anicteric.  Oropharynx clear and moist. No ulcerations or evidence of oropharyngeal candidiasis. Neck is supple.  NODES:  No cervical, supraclavicular, or axillary lymphadenopathy palpated.  BREAST EXAM:  deferred LUNGS:  Clear to auscultation bilaterally.  No wheezes or rhonchi. HEART:  Regular rate and rhythm. No murmur appreciated. ABDOMEN:  Soft, nontender.  Positive, normoactive bowel sounds. No organomegaly palpated. MSK:  No focal spinal tenderness to palpation. Full range of motion bilaterally in the upper extremities. EXTREMITIES:  No peripheral edema.   SKIN:  Clear with no obvious rashes or skin changes. No nail dyscrasia. NEURO:  Nonfocal. Well oriented.  Appropriate affect. ECOG PERFORMANCE STATUS: 0 - Asymptomatic   LABORATORY DATA: Lab Results  Component Value Date   WBC 7.1 03/31/2013   HGB 10.4* 03/31/2013   HCT 32.2* 03/31/2013   MCV 85.9 03/31/2013   PLT 336 03/31/2013      Chemistry      Component Value Date/Time   NA  141 03/31/2013 1019   NA 138 09/13/2012 0405   K 3.9 03/31/2013 1019   K 4.0 09/13/2012 0405   CL 102 09/23/2012 1028   CL 99 09/13/2012 0405   CO2 27 03/31/2013 1019   CO2 29 09/13/2012 0405   BUN 14.5 03/31/2013 1019   BUN 13 09/13/2012 0405   CREATININE 0.7 03/31/2013 1019   CREATININE 0.77 09/13/2012 0405      Component Value Date/Time   CALCIUM 9.8 03/31/2013 1019   CALCIUM 9.4 09/13/2012 0405   ALKPHOS 92 03/31/2013 1019   ALKPHOS 95 09/13/2012 0405  AST 13 03/31/2013 1019   AST 19 09/13/2012 0405   ALT 12 03/31/2013 1019   ALT 10 09/13/2012 0405   BILITOT 0.26 03/31/2013 1019   BILITOT 0.3 09/13/2012 0405     ADDITIONAL INFORMATION: 1. CHROMOGENIC IN-SITU HYBRIDIZATION Interpretation HER-2/NEU BY CISH - NO AMPLIFICATION OF HER-2 DETECTED. THE RATIO OF HER-2: CEP 17 SIGNALS WAS 1.16. Reference range: Ratio: HER2:CEP17 < 1.8 - gene amplification not observed Ratio: HER2:CEP 17 1.8-2.2 - equivocal result Ratio: HER2:CEP17 > 2.2 - gene amplification observed Enid Cutter MD Pathologist, Electronic Signature ( Signed 01/11/2012) 7. PROGNOSTIC INDICATORS - ACIS Results IMMUNOHISTOCHEMICAL AND MORPHOMETRIC ANALYSIS BY THE AUTOMATED CELLULAR IMAGING SYSTEM (ACIS) Estrogen Receptor (Negative, <1%): 100%, STRONG STAINING INTENSITY Progesterone Receptor (Negative, <1%): 100%, STRONG STAINING INTENSITY Proliferation Marker Ki67 by M IB-1 (Low<20%): 17% All controls stained appropriately Enid Cutter MD Pathologist, Electronic Signature ( Signed 01/10/2012) 1 of 4 FINAL for Ann Maxwell, Ann Maxwell ([id number]) FINAL DIAGNOSIS Diagnosis 1. Lymph node, sentinel, biopsy, Left axilla #1 - ONE BENIGN LYMPH NODE (0/1). 2. Lymph node, sentinel, biopsy, Left axilla #2 - ONE BENIGN LYMPH NODE (0/1). 3. Lymph node, sentinel, biopsy, Left axillary #3 - ONE BENIGN LYMPH NODE (0/1). 4. Lymph node, sentinel, biopsy, Left axillary #4 - ONE BENIGN LYMPH NODE (0/1). 5. Lymph node, sentinel, biopsy,  Left axillary #5 - ONE BENIGN LYMPH NODE (0/1). 6. Lymph node, sentinel, biopsy, Left axillary #6 - ONE BENIGN LYMPH NODE (0/1). 7. Breast, simple mastectomy, Left - INVASIVE DUCTAL CARCINOMA, 0.5 CM. - HIGH GRADE DUCTAL CARCINOMA IN SITU, 9 CM. - MARGINS NOT INVOLVED. - DCIS 0.3 CM FROM ANTERIOR/INFERIOR MARGIN. Microscopic Comment 7. BREAST, INVASIVE TUMOR, WITH LYMPH NODE SAMPLING Specimen, including laterality: Left breast. Procedure: Simple mastectomy. Grade: II Tubule formation: 3 Nuclear pleomorphism: 2 Mitotic:1 Tumor size (glass slide measurement): 0.5 cm Margins: Free or tumor. Invasive, distance to closest margin: 1.8 cm from deep margin. In-situ, distance to closest margin: 0.3 cm from anterior/inferior margin. If margin positive, focally or broadly: N/A Lymphovascular invasion: Not identified. Ductal carcinoma in situ: Present. Grade: High grade Extensive intraductal component: Yes. Lobular neoplasia: Not identified. Tumor focality: Unifocal Treatment effect: No. If present, treatment effect in breast tissue, lymph nodes or both: N/A Extent of tumor: Skin: Free of tumor. Nipple: Free of tumor. Skeletal muscle: N/A Lymph nodes: # examined: 6 Lymph nodes with metastasis: Isolated tumor cells (< 0.2 mm): 0 Micrometastasis: (> 0.2 mm and < 2.0 mm): 0 Macrometastasis: (> 2.0 mm): 0 Extracapsular extension: N/A Breast prognostic profile: Will be performed on invasive tumor in current specimen. 2 of 4 FINAL for Ann Maxwell, Ann Maxwell ([id number]) Microscopic Comment(continued) Estrogen receptor: 100% strong staining, DCIS. Progesterone receptor: 78%, strong staining, DCIS Her 2 neu: Pending. Ki-67: Pending. Non-neoplastic breast: Fibrocystic changes. TNM: pT1a, pN0, pMX Comments: There is a 9 cm area which is involved by high grade DCIS and there is a 0.5 cm focus of associated invasive carcinoma. The margins of the mastectomy are not involved by invasive or in  situ carcinoma and focally DCIS is 0.3 cm from the anterior, inferior soft tissue margin. Breast prognostic profile will be performed on the invasive carcinoma. (JDP:gt, 01/08/12) Claudette Laws MD Pathologist, Electronic Signature (Case signed 01/08/2012) Specimen Gross and Clinical Information  RADIOGRAPHIC STUDIES:  Dg Chest 2 View  12/29/2011  *RADIOLOGY REPORT*  Clinical Data: Preoperative evaluation.  Hypertension, has been gastroesophageal reflux disease  CHEST - 2 VIEW  Comparison: 02/29/2008  Findings: Heart and mediastinal contours are within normal  limits. The lung fields appear clear with no signs of focal infiltrate or congestive failure.  No pleural fluid or significant peribronchial cuffing is seen.  Bony structures are notable for degenerative osteophytosis of the mid and lower thoracic spine.  IMPRESSION: No worrisome focal or acute cardiopulmonary abnormality seen.   Original Report Authenticated By: Ander Gaster, M.D.    Nm Sentinel Node Inj-no Rpt (breast)  01/04/2012  CLINICAL DATA: left breast cancer   Sulfur colloid was injected intradermally by the nuclear medicine  technologist for breast cancer sentinel node localization.      ASSESSMENT: 73 year old female with  #1 invasive ductal carcinoma of the left breast status post mastectomy. Her final pathology revealed a 0.5 cm invasive ductal carcinoma with associated DCIS that measured 9 cm. All sentinel node lymph nodes were negative for metastatic disease. Tumor was ER +100% PR +100% HER-2/neu negative with Ki-67 of 7%. Postoperatively patient is doing well and is without any significant complaints.  #2 patient is a good candidate for adjuvant antiestrogen therapy consisting of arimdex1 milligram daily.   #3 iron deficiency anemia: Patient was admitted for severe iron deficiency she received 2 units of packed red cells. She has also had a GI workup. Her GI workup was negative.   PLAN:  #1 Patient is increasingly  fatigued.  Her lab results from her PCP's office were reviewed and demonstrated a ferritin of 16, iron of 39, hemoglobin of 9.4.  She is miserable.  She is taking increased amounts of iron in her diet and is taking her iron supplement daily.  She did increase it to two tablets per day since her labs were drawn by her PCP.  I have ordered feraheme for the patient and contacted our prior authorization specialist so she can receive this as soon as possible.  I reviewed feraheme in detail with the patient and gave her a handout on it in her AVS.    #2  She will continue taking Arimidex for her breast cancer.  She is tolerating it well.    #3  She will return in one month for lab and evaluation of her fatigue and iron studies.    All questions were answered. The patient knows to call the clinic with any problems, questions or concerns. We can certainly see the patient much sooner if necessary.  I spent 25 minutes counseling the patient face to face. The total time spent in the appointment was 30 minutes.  Minette Headland, [place] [phone] 07/02/2013, 9:13 AM

## 2013-07-02 ENCOUNTER — Ambulatory Visit (HOSPITAL_BASED_OUTPATIENT_CLINIC_OR_DEPARTMENT_OTHER): Payer: Medicare PPO

## 2013-07-02 ENCOUNTER — Encounter: Payer: Self-pay | Admitting: *Deleted

## 2013-07-02 VITALS — BP 133/70 | HR 105 | Temp 97.5°F | Resp 20

## 2013-07-02 DIAGNOSIS — D509 Iron deficiency anemia, unspecified: Secondary | ICD-10-CM

## 2013-07-02 MED ORDER — SODIUM CHLORIDE 0.9 % IV SOLN
1020.0000 mg | Freq: Once | INTRAVENOUS | Status: AC
  Administered 2013-07-02: 1020 mg via INTRAVENOUS
  Filled 2013-07-02: qty 34

## 2013-07-02 MED ORDER — SODIUM CHLORIDE 0.9 % IV SOLN
Freq: Once | INTRAVENOUS | Status: AC
  Administered 2013-07-02: 13:00:00 via INTRAVENOUS

## 2013-07-02 NOTE — Patient Instructions (Signed)
(  Feraheme) Ferumoxytol injection What is this medicine? FERUMOXYTOL is an iron complex. Iron is used to make healthy red blood cells, which carry oxygen and nutrients throughout the body. This medicine is used to treat iron deficiency anemia in people with chronic kidney disease. This medicine may be used for other purposes; ask your health care provider or pharmacist if you have questions. COMMON BRAND NAME(S): Feraheme  What should I tell my health care provider before I take this medicine? They need to know if you have any of these conditions: -anemia not caused by low iron levels -high levels of iron in the blood -magnetic resonance imaging (MRI) test scheduled -an unusual or allergic reaction to iron, other medicines, foods, dyes, or preservatives -pregnant or trying to get pregnant -breast-feeding How should I use this medicine? This medicine is for injection into a vein. It is given by a health care professional in a hospital or clinic setting. Talk to your pediatrician regarding the use of this medicine in children. Special care may be needed. Overdosage: If you think you've taken too much of this medicine contact a poison control center or emergency room at once. Overdosage: If you think you have taken too much of this medicine contact a poison control center or emergency room at once. NOTE: This medicine is only for you. Do not share this medicine with others. What if I miss a dose? It is important not to miss your dose. Call your doctor or health care professional if you are unable to keep an appointment. What may interact with this medicine? This medicine may interact with the following medications: -other iron products This list may not describe all possible interactions. Give your health care provider a list of all the medicines, herbs, non-prescription drugs, or dietary supplements you use. Also tell them if you smoke, drink alcohol, or use illegal drugs. Some items may  interact with your medicine. What should I watch for while using this medicine? Visit your doctor or healthcare professional regularly. Tell your doctor or healthcare professional if your symptoms do not start to get better or if they get worse. You may need blood work done while you are taking this medicine. You may need to follow a special diet. Talk to your doctor. Foods that contain iron include: whole grains/cereals, dried fruits, beans, or peas, leafy green vegetables, and organ meats (liver, kidney). What side effects may I notice from receiving this medicine? Side effects that you should report to your doctor or health care professional as soon as possible: -allergic reactions like skin rash, itching or hives, swelling of the face, lips, or tongue -breathing problems -changes in blood pressure -feeling faint or lightheaded, falls -fever or chills -flushing, sweating, or hot feelings -swelling of the ankles or feet Side effects that usually do not require medical attention (Report these to your doctor or health care professional if they continue or are bothersome.): -diarrhea -headache -nausea, vomiting -stomach pain This list may not describe all possible side effects. Call your doctor for medical advice about side effects. You may report side effects to FDA at 1-800-FDA-1088. Where should I keep my medicine? This drug is given in a hospital or clinic and will not be stored at home. NOTE: This sheet is a summary. It may not cover all possible information. If you have questions about this medicine, talk to your doctor, pharmacist, or health care provider.  2014, [employer]/Gold Standard. (2011-11-17 15:23:36)

## 2013-08-04 ENCOUNTER — Telehealth: Payer: Self-pay | Admitting: Oncology

## 2013-08-04 NOTE — Telephone Encounter (Signed)
S/W PT ADVISED APPT 4/21 HAS BEEN CANCELLED DUE TO MD LOA AND MOVED TO 4/28 AT 10.30 WITH DR. Earnest Conroy. PT VERBALIZED UNDERSTANDING. TIA

## 2013-08-05 ENCOUNTER — Ambulatory Visit: Payer: Medicare PPO | Admitting: Oncology

## 2013-08-05 ENCOUNTER — Other Ambulatory Visit: Payer: Medicare PPO

## 2013-08-12 ENCOUNTER — Other Ambulatory Visit (HOSPITAL_BASED_OUTPATIENT_CLINIC_OR_DEPARTMENT_OTHER): Payer: Medicare PPO

## 2013-08-12 ENCOUNTER — Ambulatory Visit (HOSPITAL_BASED_OUTPATIENT_CLINIC_OR_DEPARTMENT_OTHER): Payer: Medicare PPO | Admitting: Hematology and Oncology

## 2013-08-12 ENCOUNTER — Telehealth: Payer: Self-pay | Admitting: Hematology and Oncology

## 2013-08-12 VITALS — BP 124/79 | HR 99 | Temp 98.0°F | Resp 18 | Ht 64.5 in | Wt 212.5 lb

## 2013-08-12 DIAGNOSIS — D638 Anemia in other chronic diseases classified elsewhere: Secondary | ICD-10-CM

## 2013-08-12 DIAGNOSIS — E559 Vitamin D deficiency, unspecified: Secondary | ICD-10-CM

## 2013-08-12 DIAGNOSIS — D539 Nutritional anemia, unspecified: Secondary | ICD-10-CM

## 2013-08-12 DIAGNOSIS — Z170 Estrogen receptor positive status [ER+]: Secondary | ICD-10-CM

## 2013-08-12 DIAGNOSIS — D509 Iron deficiency anemia, unspecified: Secondary | ICD-10-CM

## 2013-08-12 DIAGNOSIS — C50519 Malignant neoplasm of lower-outer quadrant of unspecified female breast: Secondary | ICD-10-CM

## 2013-08-12 LAB — CBC WITH DIFFERENTIAL/PLATELET
BASO%: 0.6 % (ref 0.0–2.0)
BASOS ABS: 0 10*3/uL (ref 0.0–0.1)
EOS ABS: 0.4 10*3/uL (ref 0.0–0.5)
EOS%: 5 % (ref 0.0–7.0)
HCT: 37.7 % (ref 34.8–46.6)
HEMOGLOBIN: 12 g/dL (ref 11.6–15.9)
LYMPH%: 9.8 % — ABNORMAL LOW (ref 14.0–49.7)
MCH: 27.4 pg (ref 25.1–34.0)
MCHC: 32 g/dL (ref 31.5–36.0)
MCV: 85.7 fL (ref 79.5–101.0)
MONO#: 0.5 10*3/uL (ref 0.1–0.9)
MONO%: 6 % (ref 0.0–14.0)
NEUT%: 78.6 % — ABNORMAL HIGH (ref 38.4–76.8)
NEUTROS ABS: 6.4 10*3/uL (ref 1.5–6.5)
Platelets: 312 10*3/uL (ref 145–400)
RBC: 4.4 10*6/uL (ref 3.70–5.45)
RDW: 21 % — AB (ref 11.2–14.5)
WBC: 8.2 10*3/uL (ref 3.9–10.3)
lymph#: 0.8 10*3/uL — ABNORMAL LOW (ref 0.9–3.3)

## 2013-08-12 LAB — IRON AND TIBC CHCC
%SAT: 34 % (ref 21–57)
Iron: 126 ug/dL (ref 41–142)
TIBC: 376 ug/dL (ref 236–444)
UIBC: 250 ug/dL (ref 120–384)

## 2013-08-12 LAB — COMPREHENSIVE METABOLIC PANEL (CC13)
ALBUMIN: 3.8 g/dL (ref 3.5–5.0)
ALT: 12 U/L (ref 0–55)
ANION GAP: 9 meq/L (ref 3–11)
AST: 15 U/L (ref 5–34)
Alkaline Phosphatase: 98 U/L (ref 40–150)
BUN: 19.1 mg/dL (ref 7.0–26.0)
CHLORIDE: 104 meq/L (ref 98–109)
CO2: 28 meq/L (ref 22–29)
CREATININE: 0.8 mg/dL (ref 0.6–1.1)
Calcium: 9.9 mg/dL (ref 8.4–10.4)
Glucose: 116 mg/dl (ref 70–140)
Potassium: 3.9 mEq/L (ref 3.5–5.1)
Sodium: 141 mEq/L (ref 136–145)
Total Bilirubin: 0.25 mg/dL (ref 0.20–1.20)
Total Protein: 7 g/dL (ref 6.4–8.3)

## 2013-08-12 LAB — FERRITIN CHCC: Ferritin: 76 ng/ml (ref 9–269)

## 2013-08-12 NOTE — Telephone Encounter (Signed)
pt appt already sch/per pof to print copy of sch

## 2013-08-12 NOTE — Progress Notes (Signed)
OFFICE PROGRESS NOTE  CC  STALLINGS,SHEILA, MD [address] [place] [address] Dr. Stark Klein  Chief complaint: Follow up visit for Anemia   DIAGNOSIS:  73 year old female with 0.5 cm invasive ductal carcinoma of the left breast with DCIS status post mastectomy on 01/04/2012  Iron deficiency anemia  PRIOR THERAPY: As per the previously documented note:  #1 patient was seen in the multidisciplinary breast clinic in early September 2013 for a 10 cm ductal carcinoma in situ. The tumor was ER positive.  #2 patient has gone on to have a left mastectomy on 01/04/2012. Her final pathology revealed a 0.5 cm invasive ductal carcinoma with associated 9 cm DCIS. Sentinel lymph nodes were negative for metastatic disease. The tumor was ER positive PR positive HER-2/neu negative with Ki-67 17%.  #3 patient will now begin antiestrogen therapy with Arimidex 1 mg daily. Risks and benefits of treatment were discussed with the patient. Patient will not need radiation therapy since she has undergone a mastectomy. A total of 5 years of therapy is planned  #4 iron deficiency anemia on oral iron  CURRENT THERAPY: Arimidex 1 mg daily beginning 01/18/2012  INTERVAL HISTORY: Ann Maxwell 73 y.o. female returns for followup visit today of her iron deficiency anemia. Since her iron levels did not improve appropriately she was started on IV feraheme 10 20 mg x1 dose which was given on 07/02/2013 and she responded quite well with today's hemoglobin of 12gm/dl and hematocrit of 37.7%.  She also continue to take iron sulfate po once daily. She says her colonoscopy was negative for any bleeding source she did not have upper endoscopy. For the past few weeks she started feeling very well with improvement in her fatigue She denies any shortness of breath, palpitations, chest pains, blood in the stool and blood in the urine,  fevers, chills, night sweats, pain, hot flashes, joint pain, or any further  concerns.    MEDICAL HISTORY: Past Medical History  Diagnosis Date  . Asthma   . Hypertension   . Osteoarthritis   . Obesity   . Environmental allergies   . Vertigo   . Breast CA 2013    a. L breast DCIS, s/p L mastectomy 12/2011.  Marland Kitchen GERD (gastroesophageal reflux disease)     heartburn occasionally  . Anemia     ALLERGIES:  is allergic to adhesive; oxycodone-acetaminophen; and terramycin.  MEDICATIONS:  Current Outpatient Prescriptions  Medication Sig Dispense Refill  . acetaminophen (TYLENOL) 325 MG tablet Take 650 mg by mouth at bedtime.      Marland Kitchen anastrozole (ARIMIDEX) 1 MG tablet TAKE ONE TABLET BY MOUTH EVERY DAY  90 tablet  6  . aspirin EC 81 MG tablet Take 81 mg by mouth daily.      . B Complex-C (SUPER B COMPLEX PO) Take 1 capsule by mouth every morning.      . cholecalciferol (VITAMIN D) 1000 UNITS tablet Take 1,000 Units by mouth daily.      . ferrous sulfate 325 (65 FE) MG tablet Take 325 mg by mouth 2 (two) times daily with a meal. For two weeks been taking 2 a day      . fish oil-omega-3 fatty acids 1000 MG capsule Take 1 g by mouth daily.      . Fluticasone-Salmeterol (ADVAIR) 250-50 MCG/DOSE AEPB Inhale 1 puff into the lungs 2 (two) times daily.      . hydrochlorothiazide (MICROZIDE) 12.5 MG capsule Take 12.5 mg by mouth daily.      Marland Kitchen  loratadine (CLARITIN) 10 MG tablet Take 10 mg by mouth daily as needed. For allergies      . losartan-hydrochlorothiazide (HYZAAR) 50-12.5 MG per tablet Take 1 tablet by mouth daily.      . montelukast (SINGULAIR) 10 MG tablet Take 10 mg by mouth daily.       Marland Kitchen RESVERATROL PO Take 1 tablet by mouth daily.      . vitamin E 200 UNIT capsule Take 200 Units by mouth 2 (two) times daily.      . hydrocortisone 2.5 % cream Apply 1 application topically 4 (four) times daily.       Marland Kitchen ipratropium-albuterol (DUONEB) 0.5-2.5 (3) MG/3ML SOLN Take 3 mLs by nebulization every 6 (six) hours as needed. For wheezing      . PROAIR HFA 108 (90 BASE)  MCG/ACT inhaler        No current facility-administered medications for this visit.    SURGICAL HISTORY:  Past Surgical History  Procedure Laterality Date  . Tonsillectomy and adenoidectomy    . Tvh and bso  1994  . Tibia fracture surgery  1995    and fibular  with int fx  . Cholecystectomy    . Abdominal hysterectomy    . Lithotripsy    . Lithectomy  2010  . Mastectomy w/ sentinel node biopsy  01/04/2012    Procedure: MASTECTOMY WITH SENTINEL LYMPH NODE BIOPSY;  Surgeon: Stark Klein, MD;  Location: [facility];  Service: General;  Laterality: Left;  . Breast surgery      REVIEW OF SYSTEMS:   A 10 point review of systems was conducted and is otherwise negative except for what is noted above.     Health Maintenance Mammogram: 11/2012 Colonoscopy:3-4 years ago, due in 1-2 years Bone Density Scan: 10/13  Pap Smear: s/p TAH/BSO Eye Exam: 1/13 Vitamin D Level: pending Lipid Panel:06/13  PHYSICAL EXAMINATION: Blood pressure 124/79, pulse 99, temperature 98 F (36.7 C), temperature source Oral, resp. rate 18, height 5' 4.5" (1.638 m), weight 212 lb 8 oz (96.389 kg). Body mass index is 35.93 kg/(m^2). GENERAL: Patient is a well appearing female in no acute distress HEENT:  Sclerae anicteric.  Oropharynx clear and moist. No ulcerations or evidence of oropharyngeal candidiasis. Neck is supple.  NODES:  No cervical, supraclavicular, or axillary lymphadenopathy palpated.  BREAST EXAM:  Left breast status post mastectomy scar noted without any nodules appreciated. Right breast no masses felt  LUNGS:  Clear to auscultation bilaterally.  No wheezes or rhonchi. HEART:  Regular rate and rhythm. No murmur appreciated. ABDOMEN:  Soft, nontender.  Positive, normoactive bowel sounds. No organomegaly palpated. MSK:  No focal spinal tenderness to palpation. Full range of motion bilaterally in the upper extremities. EXTREMITIES:  No peripheral edema.   SKIN:  Clear with no obvious rashes or skin  changes. No nail dyscrasia. NEURO:  Nonfocal. Well oriented.  Appropriate affect. ECOG PERFORMANCE STATUS: 0 - Asymptomatic   LABORATORY DATA: Lab Results  Component Value Date   WBC 8.2 08/12/2013   HGB 12.0 08/12/2013   HCT 37.7 08/12/2013   MCV 85.7 08/12/2013   PLT 312 08/12/2013      Chemistry      Component Value Date/Time   NA 141 08/12/2013 1041   NA 138 09/13/2012 0405   K 3.9 08/12/2013 1041   K 4.0 09/13/2012 0405   CL 102 09/23/2012 1028   CL 99 09/13/2012 0405   CO2 28 08/12/2013 1041   CO2 29 09/13/2012 0405  BUN 19.1 08/12/2013 1041   BUN 13 09/13/2012 0405   CREATININE 0.8 08/12/2013 1041   CREATININE 0.77 09/13/2012 0405      Component Value Date/Time   CALCIUM 9.9 08/12/2013 1041   CALCIUM 9.4 09/13/2012 0405   ALKPHOS 98 08/12/2013 1041   ALKPHOS 95 09/13/2012 0405   AST 15 08/12/2013 1041   AST 19 09/13/2012 0405   ALT 12 08/12/2013 1041   ALT 10 09/13/2012 0405   BILITOT 0.25 08/12/2013 1041   BILITOT 0.3 09/13/2012 0405     ADDITIONAL INFORMATION: 1. CHROMOGENIC IN-SITU HYBRIDIZATION Interpretation HER-2/NEU BY CISH - NO AMPLIFICATION OF HER-2 DETECTED. THE RATIO OF HER-2: CEP 17 SIGNALS WAS 1.16. Reference range: Ratio: HER2:CEP17 < 1.8 - gene amplification not observed Ratio: HER2:CEP 17 1.8-2.2 - equivocal result Ratio: HER2:CEP17 > 2.2 - gene amplification observed Enid Cutter MD Pathologist, Electronic Signature ( Signed 01/11/2012) 7. PROGNOSTIC INDICATORS - ACIS Results IMMUNOHISTOCHEMICAL AND MORPHOMETRIC ANALYSIS BY THE AUTOMATED CELLULAR IMAGING SYSTEM (ACIS) Estrogen Receptor (Negative, <1%): 100%, STRONG STAINING INTENSITY Progesterone Receptor (Negative, <1%): 100%, STRONG STAINING INTENSITY Proliferation Marker Ki67 by M IB-1 (Low<20%): 17% All controls stained appropriately Enid Cutter MD Pathologist, Electronic Signature ( Signed 01/10/2012) 1 of 4 FINAL for Ann Maxwell, Ann Maxwell ([id number]) FINAL DIAGNOSIS Diagnosis 1. Lymph node,  sentinel, biopsy, Left axilla #1 - ONE BENIGN LYMPH NODE (0/1). 2. Lymph node, sentinel, biopsy, Left axilla #2 - ONE BENIGN LYMPH NODE (0/1). 3. Lymph node, sentinel, biopsy, Left axillary #3 - ONE BENIGN LYMPH NODE (0/1). 4. Lymph node, sentinel, biopsy, Left axillary #4 - ONE BENIGN LYMPH NODE (0/1). 5. Lymph node, sentinel, biopsy, Left axillary #5 - ONE BENIGN LYMPH NODE (0/1). 6. Lymph node, sentinel, biopsy, Left axillary #6 - ONE BENIGN LYMPH NODE (0/1). 7. Breast, simple mastectomy, Left - INVASIVE DUCTAL CARCINOMA, 0.5 CM. - HIGH GRADE DUCTAL CARCINOMA IN SITU, 9 CM. - MARGINS NOT INVOLVED. - DCIS 0.3 CM FROM ANTERIOR/INFERIOR MARGIN. Microscopic Comment 7. BREAST, INVASIVE TUMOR, WITH LYMPH NODE SAMPLING Specimen, including laterality: Left breast. Procedure: Simple mastectomy. Grade: II Tubule formation: 3 Nuclear pleomorphism: 2 Mitotic:1 Tumor size (glass slide measurement): 0.5 cm Margins: Free or tumor. Invasive, distance to closest margin: 1.8 cm from deep margin. In-situ, distance to closest margin: 0.3 cm from anterior/inferior margin. If margin positive, focally or broadly: N/A Lymphovascular invasion: Not identified. Ductal carcinoma in situ: Present. Grade: High grade Extensive intraductal component: Yes. Lobular neoplasia: Not identified. Tumor focality: Unifocal Treatment effect: No. If present, treatment effect in breast tissue, lymph nodes or both: N/A Extent of tumor: Skin: Free of tumor. Nipple: Free of tumor. Skeletal muscle: N/A Lymph nodes: # examined: 6 Lymph nodes with metastasis: Isolated tumor cells (< 0.2 mm): 0 Micrometastasis: (> 0.2 mm and < 2.0 mm): 0 Macrometastasis: (> 2.0 mm): 0 Extracapsular extension: N/A Breast prognostic profile: Will be performed on invasive tumor in current specimen. 2 of 4 FINAL for Ann Maxwell, Ann Maxwell ([id number]) Microscopic Comment(continued) Estrogen receptor: 100% strong staining,  DCIS. Progesterone receptor: 78%, strong staining, DCIS Her 2 neu: Pending. Ki-67: Pending. Non-neoplastic breast: Fibrocystic changes. TNM: pT1a, pN0, pMX Comments: There is a 9 cm area which is involved by high grade DCIS and there is a 0.5 cm focus of associated invasive carcinoma. The margins of the mastectomy are not involved by invasive or in situ carcinoma and focally DCIS is 0.3 cm from the anterior, inferior soft tissue margin. Breast prognostic profile will be performed on the invasive carcinoma. (JDP:gt, 01/08/12)  Claudette Laws MD Pathologist, Electronic Signature (Case signed 01/08/2012) Specimen Gross and Clinical Information  RADIOGRAPHIC STUDIES:  Dg Chest 2 View  12/29/2011  *RADIOLOGY REPORT*  Clinical Data: Preoperative evaluation.  Hypertension, has been gastroesophageal reflux disease  CHEST - 2 VIEW  Comparison: 02/29/2008  Findings: Heart and mediastinal contours are within normal limits. The lung fields appear clear with no signs of focal infiltrate or congestive failure.  No pleural fluid or significant peribronchial cuffing is seen.  Bony structures are notable for degenerative osteophytosis of the mid and lower thoracic spine.  IMPRESSION: No worrisome focal or acute cardiopulmonary abnormality seen.   Original Report Authenticated By: Ander Gaster, M.D.    Nm Sentinel Node Inj-no Rpt (breast)  01/04/2012  CLINICAL DATA: left breast cancer   Sulfur colloid was injected intradermally by the nuclear medicine  technologist for breast cancer sentinel node localization.      ASSESSMENT/PLAN: 73 year old female with  #1 invasive ductal carcinoma of the left breast status post mastectomy. Her final pathology revealed a 0.5 cm invasive ductal carcinoma with associated DCIS that measured 9 cm. All sentinel node lymph nodes were negative for metastatic disease. Tumor was ER +100% PR +100% HER-2/neu negative with Ki-67 of 7%. She was started on adjuvant Arimidex 1 mg  daily beginning 01/18/2012. Her last mammogram was in August 2014 and it revealed no evidence of any malignancy She is due to get her next right breast mammogram in August 2015   #2 continue calcium and vitamin D supplementation while she is on Arimidex. Her next bone densitometry is due in October 2015   #3 Iron deficiency anemia: Her hemoglobin and hematocrit improved after IV iron ( feraheme). Her iron indices today revealed a ferritin of 76 and iron saturation of 34%. She is clinically feeling much better after IV iron .Since her colonoscopy revealed no significant source of bleeding and asked the patient to follow up with GI for possible EGD.  #4 Next followup visit as scheduled with Dr. Humphrey Rolls in may 2015  #5 We will repeat iron indices, ferritin, CBC and differential and CMP on the day of next visit to assess further need of feraheme   All questions were answered. The patient knows to call the clinic with any problems, questions or concerns. We can certainly see the patient much sooner if necessary.  I spent 25 minutes counseling the patient face to face. The total time spent in the appointment was 35 minutes.  Wilmon Arms, M.D. [place] [phone] 08/12/2013, 6:22 PM

## 2013-08-14 ENCOUNTER — Telehealth: Payer: Self-pay | Admitting: *Deleted

## 2013-08-14 LAB — VITAMIN D 25 HYDROXY (VIT D DEFICIENCY, FRACTURES): Vit D, 25-Hydroxy: 41 ng/mL (ref 30–89)

## 2013-08-14 LAB — ERYTHROPOIETIN: ERYTHROPOIETIN: 15.9 m[IU]/mL (ref 2.6–18.5)

## 2013-08-14 NOTE — Telephone Encounter (Signed)
Called pt to inform her on normal Vit D results. No answer, but left a detailed message via VM. If pt has any questions she can call me back @ [phone]. Message to be forwarded to Charlestine Massed, NP.

## 2013-10-03 ENCOUNTER — Telehealth: Payer: Self-pay | Admitting: Hematology and Oncology

## 2013-10-03 NOTE — Telephone Encounter (Signed)
, °

## 2013-10-08 ENCOUNTER — Other Ambulatory Visit: Payer: Medicare PPO

## 2013-10-08 ENCOUNTER — Ambulatory Visit: Payer: Medicare PPO | Admitting: Oncology

## 2013-10-21 ENCOUNTER — Telehealth: Payer: Self-pay | Admitting: Hematology and Oncology

## 2013-10-21 NOTE — Telephone Encounter (Signed)
, °

## 2013-11-12 ENCOUNTER — Encounter (INDEPENDENT_AMBULATORY_CARE_PROVIDER_SITE_OTHER): Payer: Self-pay | Admitting: General Surgery

## 2013-11-17 ENCOUNTER — Other Ambulatory Visit: Payer: Self-pay

## 2013-11-17 DIAGNOSIS — Z1231 Encounter for screening mammogram for malignant neoplasm of breast: Secondary | ICD-10-CM

## 2013-11-25 ENCOUNTER — Emergency Department (HOSPITAL_COMMUNITY)
Admission: EM | Admit: 2013-11-25 | Discharge: 2013-11-25 | Disposition: A | Discharge status: Home / Self Care | Payer: Medicare PPO | Attending: Emergency Medicine | Admitting: Emergency Medicine

## 2013-11-25 ENCOUNTER — Encounter (HOSPITAL_COMMUNITY): Payer: Self-pay | Admitting: Emergency Medicine

## 2013-11-25 ENCOUNTER — Emergency Department (HOSPITAL_COMMUNITY): Payer: Medicare PPO

## 2013-11-25 DIAGNOSIS — W1789XA Other fall from one level to another, initial encounter: Secondary | ICD-10-CM | POA: Diagnosis not present

## 2013-11-25 DIAGNOSIS — Y9389 Activity, other specified: Secondary | ICD-10-CM | POA: Diagnosis not present

## 2013-11-25 DIAGNOSIS — D649 Anemia, unspecified: Secondary | ICD-10-CM | POA: Insufficient documentation

## 2013-11-25 DIAGNOSIS — S52502A Unspecified fracture of the lower end of left radius, initial encounter for closed fracture: Secondary | ICD-10-CM

## 2013-11-25 DIAGNOSIS — S52609A Unspecified fracture of lower end of unspecified ulna, initial encounter for closed fracture: Principal | ICD-10-CM

## 2013-11-25 DIAGNOSIS — Z7982 Long term (current) use of aspirin: Secondary | ICD-10-CM | POA: Insufficient documentation

## 2013-11-25 DIAGNOSIS — S52509A Unspecified fracture of the lower end of unspecified radius, initial encounter for closed fracture: Secondary | ICD-10-CM | POA: Insufficient documentation

## 2013-11-25 DIAGNOSIS — Z853 Personal history of malignant neoplasm of breast: Secondary | ICD-10-CM | POA: Insufficient documentation

## 2013-11-25 DIAGNOSIS — Y92009 Unspecified place in unspecified non-institutional (private) residence as the place of occurrence of the external cause: Secondary | ICD-10-CM | POA: Diagnosis not present

## 2013-11-25 DIAGNOSIS — J45909 Unspecified asthma, uncomplicated: Secondary | ICD-10-CM | POA: Insufficient documentation

## 2013-11-25 DIAGNOSIS — Z90710 Acquired absence of both cervix and uterus: Secondary | ICD-10-CM | POA: Insufficient documentation

## 2013-11-25 DIAGNOSIS — S46909A Unspecified injury of unspecified muscle, fascia and tendon at shoulder and upper arm level, unspecified arm, initial encounter: Secondary | ICD-10-CM | POA: Diagnosis present

## 2013-11-25 DIAGNOSIS — E669 Obesity, unspecified: Secondary | ICD-10-CM | POA: Diagnosis not present

## 2013-11-25 DIAGNOSIS — Z8719 Personal history of other diseases of the digestive system: Secondary | ICD-10-CM | POA: Insufficient documentation

## 2013-11-25 DIAGNOSIS — M1990 Unspecified osteoarthritis, unspecified site: Secondary | ICD-10-CM | POA: Insufficient documentation

## 2013-11-25 DIAGNOSIS — S4980XA Other specified injuries of shoulder and upper arm, unspecified arm, initial encounter: Secondary | ICD-10-CM | POA: Insufficient documentation

## 2013-11-25 DIAGNOSIS — S52602A Unspecified fracture of lower end of left ulna, initial encounter for closed fracture: Secondary | ICD-10-CM

## 2013-11-25 DIAGNOSIS — I10 Essential (primary) hypertension: Secondary | ICD-10-CM | POA: Insufficient documentation

## 2013-11-25 DIAGNOSIS — Z79899 Other long term (current) drug therapy: Secondary | ICD-10-CM | POA: Diagnosis not present

## 2013-11-25 MED ORDER — HYDROMORPHONE HCL PF 1 MG/ML IJ SOLN
0.5000 mg | Freq: Once | INTRAMUSCULAR | Status: AC
  Administered 2013-11-25: 0.5 mg via INTRAVENOUS
  Filled 2013-11-25: qty 1

## 2013-11-25 MED ORDER — ONDANSETRON HCL 4 MG PO TABS: 4.0000 mg | ORAL_TABLET | Freq: Three times a day (TID) | ORAL | Status: DC | PRN

## 2013-11-25 MED ORDER — ONDANSETRON HCL 4 MG/2ML IJ SOLN
4.0000 mg | Freq: Once | INTRAMUSCULAR | Status: AC
  Administered 2013-11-25: 4 mg via INTRAVENOUS
  Filled 2013-11-25: qty 2

## 2013-11-25 MED ORDER — SODIUM CHLORIDE 0.9 % IV SOLN
INTRAVENOUS | Status: DC
  Administered 2013-11-25: 17:00:00 via INTRAVENOUS

## 2013-11-25 MED ORDER — OXYCODONE-ACETAMINOPHEN 5-325 MG PO TABS: 1.0000 | ORAL_TABLET | ORAL | Status: DC | PRN

## 2013-11-25 NOTE — Discharge Instructions (Signed)
Elevate arm/wrist. Keep splint clean and dry. Follow up with Dr Grandville Silos as discussed with him. Return to ER if worse, new symptoms, severe pain, numbness/weakness, other concern.    Wrist Fracture A wrist fracture is a break or crack in one of the bones of your wrist. Your wrist is made up of eight small bones at the palm of your hand (carpal bones) and two long bones that make up your forearm (radius and ulna).  CAUSES   A direct blow to the wrist.  Falling on an outstretched hand.  Trauma, such as a car accident or a fall. RISK FACTORS Risk factors for wrist fracture include:   Participating in contact and high-risk sports, such as skiing, biking, and ice skating.  Taking steroid medicines.  Smoking.  Being female.  Being Caucasian.  Drinking more than three alcoholic beverages per day.  Having low or lowered bone density (osteoporosis or osteopenia).  Age. Older adults have decreased bone density.  Women who have had menopause.  History of previous fractures. SIGNS AND SYMPTOMS Symptoms of wrist fractures include tenderness, bruising, and inflammation. Additionally, the wrist may hang in an odd position or appear deformed.  DIAGNOSIS Diagnosis may include:  Physical exam.  X-ray. TREATMENT Treatment depends on many factors, including the nature and location of the fracture, your age, and your activity level. Treatment for wrist fracture can be nonsurgical or surgical.  Nonsurgical Treatment A plaster cast or splint may be applied to your wrist if the bone is in a good position. If the fracture is not in good position, it may be necessary for your health care provider to realign it before applying a splint or cast. Usually, a cast or splint will be worn for several weeks.  Surgical Treatment Sometimes the position of the bone is so far out of place that surgery is required to apply a device to hold it together as it heals. Depending on the fracture, there are a  number of options for holding the bone in place while it heals, such as a cast and metal pins.  HOME CARE INSTRUCTIONS  Keep your injured wrist elevated and move your fingers as much as possible.  Do not put pressure on any part of your cast or splint. It may break.   Use a plastic bag to protect your cast or splint from water while bathing or showering. Do not lower your cast or splint into water.  Take medicines only as directed by your health care provider.  Keep your cast or splint clean and dry. If it becomes wet, damaged, or suddenly feels too tight, contact your health care provider right away.  Do not use any tobacco products including cigarettes, chewing tobacco, or electronic cigarettes. Tobacco can delay bone healing. If you need help quitting, ask your health care provider.  Keep all follow-up visits as directed by your health care provider. This is important.  Ask your health care provider if you should take supplements of calcium and vitamins C and D to promote bone healing. SEEK MEDICAL CARE IF:   Your cast or splint is damaged, breaks, or gets wet.  You have a fever.  You have chills.  You have continued severe pain or more swelling than you did before the cast was put on. SEEK IMMEDIATE MEDICAL CARE IF:   Your hand or fingernails on the injured arm turn blue or gray, or feel cold or numb.  You have decreased feeling in the fingers of your injured arm. MAKE  SURE YOU:  Understand these instructions.  Will watch your condition.  Will get help right away if you are not doing well or get worse. Document Released: 01/11/2005 Document Revised: 08/18/2013 Document Reviewed: 04/21/2011 [facility] Patient Information 2015 [place], [place]. This information is not intended to replace advice given to you by your health care provider. Make sure you discuss any questions you have with your health care provider.     Splint Care Splints protect and rest injuries. Splints  can be made of plaster, fiberglass, or metal. They are used to treat broken bones, sprains, tendonitis, and other injuries. HOME CARE  Keep the injured area raised (elevated) while sitting or lying down. Keep the injured body part just above the level of the heart. This will decrease puffiness (swelling) and pain.  If an elastic bandage was used to hold the splint, it can be loosened. Only loosen it to make room for puffiness and to ease pain.  Keep the splint clean and dry.  Do not scratch the skin under the splint with sharp or pointed objects.  Follow up with your doctor as told. GET HELP RIGHT AWAY IF:   There is more pain or pressure around the injury.  There is numbness, tingling, or pain in the toes or fingers past the injury.  The fingers or toes become cold or blue.  The splint becomes too soft or breaks before the injury is healed. MAKE SURE YOU:   Understand these instructions.  Will watch this condition.  Will get help right away if you are not doing well or get worse. Document Released: 01/11/2008 Document Revised: 06/26/2011 Document Reviewed: 01/11/2008 [facility] Patient Information 2015 [place], [place]. This information is not intended to replace advice given to you by your health care provider. Make sure you discuss any questions you have with your health care provider. Wrist Fracture A wrist fracture is a break or crack in one of the bones of your wrist. Your wrist is made up of eight small bones at the palm of your hand (carpal bones) and two long bones that make up your forearm (radius and ulna).  CAUSES   A direct blow to the wrist.  Falling on an outstretched hand.  Trauma, such as a car accident or a fall. RISK FACTORS Risk factors for wrist fracture include:   Participating in contact and high-risk sports, such as skiing, biking, and ice skating.  Taking steroid medicines.  Smoking.  Being female.  Being Caucasian.  Drinking more than three  alcoholic beverages per day.  Having low or lowered bone density (osteoporosis or osteopenia).  Age. Older adults have decreased bone density.  Women who have had menopause.  History of previous fractures. SIGNS AND SYMPTOMS Symptoms of wrist fractures include tenderness, bruising, and inflammation. Additionally, the wrist may hang in an odd position or appear deformed.  DIAGNOSIS Diagnosis may include:  Physical exam.  X-ray. TREATMENT Treatment depends on many factors, including the nature and location of the fracture, your age, and your activity level. Treatment for wrist fracture can be nonsurgical or surgical.  Nonsurgical Treatment A plaster cast or splint may be applied to your wrist if the bone is in a good position. If the fracture is not in good position, it may be necessary for your health care provider to realign it before applying a splint or cast. Usually, a cast or splint will be worn for several weeks.  Surgical Treatment Sometimes the position of the bone is so far out of  place that surgery is required to apply a device to hold it together as it heals. Depending on the fracture, there are a number of options for holding the bone in place while it heals, such as a cast and metal pins.  HOME CARE INSTRUCTIONS  Keep your injured wrist elevated and move your fingers as much as possible.  Do not put pressure on any part of your cast or splint. It may break.   Use a plastic bag to protect your cast or splint from water while bathing or showering. Do not lower your cast or splint into water.  Take medicines only as directed by your health care provider.  Keep your cast or splint clean and dry. If it becomes wet, damaged, or suddenly feels too tight, contact your health care provider right away.  Do not use any tobacco products including cigarettes, chewing tobacco, or electronic cigarettes. Tobacco can delay bone healing. If you need help quitting, ask your health care  provider.  Keep all follow-up visits as directed by your health care provider. This is important.  Ask your health care provider if you should take supplements of calcium and vitamins C and D to promote bone healing. SEEK MEDICAL CARE IF:   Your cast or splint is damaged, breaks, or gets wet.  You have a fever.  You have chills.  You have continued severe pain or more swelling than you did before the cast was put on. SEEK IMMEDIATE MEDICAL CARE IF:   Your hand or fingernails on the injured arm turn blue or gray, or feel cold or numb.  You have decreased feeling in the fingers of your injured arm. MAKE SURE YOU:  Understand these instructions.  Will watch your condition.  Will get help right away if you are not doing well or get worse. Document Released: 01/11/2005 Document Revised: 08/18/2013 Document Reviewed: 04/21/2011 [facility] Patient Information 2015 [place], [place]. This information is not intended to replace advice given to you by your health care provider. Make sure you discuss any questions you have with your health care provider.

## 2013-11-25 NOTE — ED Notes (Signed)
Pt reports to ed with c/o fall and left wrist injury. Pt sts she fell while stepping off of a stepstool, fell on her left side, per family member obvious deformity to wrist . Pt has splint and ice pack.

## 2013-11-25 NOTE — Consult Note (Signed)
ORTHOPAEDIC CONSULTATION HISTORY & PHYSICAL REQUESTING PHYSICIAN: Mirna Mires, MD  Chief Complaint: left wrist pain  HPI: Ann Maxwell is a 73 y.o. female who sustained [place] injury from ground height today.  +pain, swelling, deformity left wrist.  Past Medical History  Diagnosis Date  . Asthma   . Hypertension   . Osteoarthritis   . Obesity   . Environmental allergies   . Vertigo   . Breast CA 2013    a. L breast DCIS, s/p L mastectomy 12/2011.  Marland Kitchen GERD (gastroesophageal reflux disease)     heartburn occasionally  . Anemia    Past Surgical History  Procedure Laterality Date  . Tonsillectomy and adenoidectomy    . Tvh and bso  1994  . Tibia fracture surgery  1995    and fibular  with int fx  . Cholecystectomy    . Abdominal hysterectomy    . Lithotripsy    . Lithectomy  2010  . Mastectomy w/ sentinel node biopsy  01/04/2012    Procedure: MASTECTOMY WITH SENTINEL LYMPH NODE BIOPSY;  Surgeon: Stark Klein, MD;  Location: [facility];  Service: General;  Laterality: Left;  . Breast surgery     History   Social History  . Marital Status: Divorced    Spouse Name: N/A    Number of Children: N/A  . Years of Education: N/A   Occupational History  . retired Marine scientist    Social History Main Topics  . Smoking status: Never Smoker   . Smokeless tobacco: Never Used  . Alcohol Use: 0.0 oz/week     Comment: occasionally - few times a month  . Drug Use: No  . Sexual Activity: No   Other Topics Concern  . None   Social History Narrative   Retired Sports coach, 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 who is a Marine scientist   Family History  Problem Relation Age of Onset  . Congestive Heart Failure Father     Died at 37  . Hypertension Father   . Heart disease Father   . Ovarian cancer Mother 51  . Uterine cancer Maternal Grandmother     late 40s  . Liver cancer Paternal Grandfather   . Thyroid cancer Sister 72  . Colon cancer Neg Hx   . Stomach cancer Neg Hx    Allergies  Allergen Reactions   . Adhesive [Tape] Other (See Comments)    Surgical tape causes blisters  . Oxycodone-Acetaminophen Nausea Only  . Terramycin [Oxytetracycline] Rash   Prior to Admission medications   Medication Sig Start Date End Date Taking? Authorizing Provider  acetaminophen (TYLENOL) 325 MG tablet Take 650 mg by mouth at bedtime as needed.    Yes Historical Provider, MD  anastrozole (ARIMIDEX) 1 MG tablet Take 1 mg by mouth daily.   Yes Historical Provider, MD  aspirin EC 81 MG tablet Take 81 mg by mouth daily.   Yes Historical Provider, MD  B Complex-C (SUPER B COMPLEX PO) Take 1 capsule by mouth every morning.   Yes Historical Provider, MD  Bioflavonoid Products (PERIDIN-C PO) Take 2 tablets by mouth 2 (two) times daily. 200 mg.   Yes Historical Provider, MD  cholecalciferol (VITAMIN D) 1000 UNITS tablet Take 2,000 Units by mouth daily.    Yes Historical Provider, MD  Fluticasone-Salmeterol (ADVAIR) 250-50 MCG/DOSE AEPB Inhale 1 puff into the lungs 2 (two) times daily.   Yes Historical Provider, MD  GRAPE SEED EXTRACT PO Take 1 tablet by mouth daily.   Yes Historical  Provider, MD  hydrochlorothiazide (MICROZIDE) 12.5 MG capsule Take 12.5 mg by mouth daily.   Yes Historical Provider, MD  ipratropium-albuterol (DUONEB) 0.5-2.5 (3) MG/3ML SOLN Take 3 mLs by nebulization every 6 (six) hours as needed. For wheezing   Yes Historical Provider, MD  iron polysaccharides (NIFEREX) 150 MG capsule Take 150 mg by mouth daily.   Yes Historical Provider, MD  loratadine (CLARITIN) 10 MG tablet Take 10 mg by mouth daily as needed. For allergies   Yes Historical Provider, MD  losartan-hydrochlorothiazide (HYZAAR) 50-12.5 MG per tablet Take 1 tablet by mouth daily.   Yes Historical Provider, MD  montelukast (SINGULAIR) 10 MG tablet Take 10 mg by mouth daily.   Yes Historical Provider, MD  PROAIR HFA 108 (90 BASE) MCG/ACT inhaler Inhale 1-2 puffs into the lungs once as needed.  05/13/13  Yes Historical Provider, MD  vitamin  E 200 UNIT capsule Take 200 Units by mouth 2 (two) times daily.   Yes Historical Provider, MD  ondansetron (ZOFRAN) 4 MG tablet Take 1 tablet (4 mg total) by mouth every 8 (eight) hours as needed for nausea or vomiting. 11/25/13   Jolyn Nap, MD  oxyCODONE-acetaminophen (ROXICET) 5-325 MG per tablet Take 1-2 tablets by mouth every 4 (four) hours as needed. 11/25/13   Jolyn Nap, MD   Dg Wrist Complete Left  11/25/2013   CLINICAL DATA:  Fall.  Left wrist pain.  EXAM: LEFT WRIST - COMPLETE 3+ VIEW  COMPARISON:  None.  FINDINGS: The bones are diffusely osteopenic. There is a comminuted, intra-articular, mildly impacted fracture of the distal radius which demonstrates dorsal angulation as well as mild dorsal displacement of a fracture fragment. There is also a minimally displaced fracture through the ulnar styloid. There is no dislocation. Mild first CMC joint osteoarthrosis is noted. Soft tissue swelling is present about the wrist.  IMPRESSION: Fractures of the distal radius and ulna.   Electronically Signed   By: Logan Bores   On: 11/25/2013 17:24    Positive ROS: All other systems have been reviewed and were otherwise negative with the exception of those mentioned in the HPI and as above.  Physical Exam: Vitals: Refer to EMR. Constitutional:  WD, WN, NAD HEENT:  NCAT, EOMI Neuro/Psych:  Alert & oriented to person, place, and time; appropriate mood & affect Lymphatic: No generalized extremity edema or lymphadenopathy Extremities / MSK:  The extremities are normal with respect to appearance, ranges of motion, joint stability, muscle strength/tone, sensation, & perfusion except as otherwise noted:  TTP left distal forearm. + LT sens R/M/U, + motor to same CR brisk,   Assessment: Left DRFx, borderline for continued closed treatment  Plan: ST splint applied by ED.  Instructions given, options reviewed.  F/u next week with new xrays of left wrist in splint to include inclined lateral and  detemine whether to proceed with surgery or continued nonop tx.  Rayvon Char [address] S[address], [address] [place], [place]  [address] Office: [phone] Mobile: [phone]

## 2013-11-25 NOTE — ED Provider Notes (Signed)
CSN: [id number]     Arrival date & time 11/25/13  1614 History   First MD Initiated Contact with Patient 11/25/13 1632     Chief Complaint  Patient presents with  . Fall  . Arm Injury     (Consider location/radiation/quality/duration/timing/severity/associated sxs/prior Treatment) Patient is a 73 y.o. female presenting with fall and arm injury. The history is provided by the patient.  Fall Pertinent negatives include no chest pain, no abdominal pain, no headaches and no shortness of breath.  Arm Injury Associated symptoms: no back pain, no fever and no neck pain   pt was up on step stool today at home, lost balance, fell onto outstretched left hand/wrist. C/o left wrist pain and deformity. Skin intact. Pain constant, mod-severe, worse w movement and palpation. No elbow or shoulder pain. No numbness/weakness. Denies any other injury. No headache. No neck or back pain. No faintness, dizziness, cp, palpitations, headache or sob prior to fall, was in normal well state of health, feeling normally just prior to fall. Right hand dominant.      Past Medical History  Diagnosis Date  . Asthma   . Hypertension   . Osteoarthritis   . Obesity   . Environmental allergies   . Vertigo   . Breast CA 2013    a. L breast DCIS, s/p L mastectomy 12/2011.  Marland Kitchen GERD (gastroesophageal reflux disease)     heartburn occasionally  . Anemia    Past Surgical History  Procedure Laterality Date  . Tonsillectomy and adenoidectomy    . Tvh and bso  1994  . Tibia fracture surgery  1995    and fibular  with int fx  . Cholecystectomy    . Abdominal hysterectomy    . Lithotripsy    . Lithectomy  2010  . Mastectomy w/ sentinel node biopsy  01/04/2012    Procedure: MASTECTOMY WITH SENTINEL LYMPH NODE BIOPSY;  Surgeon: Stark Klein, MD;  Location: [facility];  Service: General;  Laterality: Left;  . Breast surgery     Family History  Problem Relation Age of Onset  . Congestive Heart Failure Father     Died at 49   . Hypertension Father   . Heart disease Father   . Ovarian cancer Mother 66  . Uterine cancer Maternal Grandmother     late 106s  . Liver cancer Paternal Grandfather   . Thyroid cancer Sister 3  . Colon cancer Neg Hx   . Stomach cancer Neg Hx    History  Substance Use Topics  . Smoking status: Never Smoker   . Smokeless tobacco: Never Used  . Alcohol Use: 0.0 oz/week     Comment: occasionally - few times a month   OB History   Grav Para Term Preterm Abortions TAB SAB Ect Mult Living                 Review of Systems  Constitutional: Negative for fever and chills.  HENT: Negative for nosebleeds.   Eyes: Negative for visual disturbance.  Respiratory: Negative for shortness of breath.   Cardiovascular: Negative for chest pain.  Gastrointestinal: Negative for vomiting and abdominal pain.  Genitourinary: Negative for flank pain.  Musculoskeletal: Negative for back pain and neck pain.  Skin: Negative for wound.  Neurological: Negative for weakness, numbness and headaches.  Hematological: Does not bruise/bleed easily.  Psychiatric/Behavioral: Negative for confusion.      Allergies  Adhesive; Oxycodone-acetaminophen; and Terramycin  Home Medications   Prior to Admission medications  Medication Sig Start Date End Date Taking? Authorizing Provider  acetaminophen (TYLENOL) 325 MG tablet Take 650 mg by mouth at bedtime.    Historical Provider, MD  aspirin EC 81 MG tablet Take 81 mg by mouth daily.    Historical Provider, MD  B Complex-C (SUPER B COMPLEX PO) Take 1 capsule by mouth every morning.    Historical Provider, MD  cholecalciferol (VITAMIN D) 1000 UNITS tablet Take 1,000 Units by mouth daily.    Historical Provider, MD  Fluticasone-Salmeterol (ADVAIR) 250-50 MCG/DOSE AEPB Inhale 1 puff into the lungs 2 (two) times daily.    Historical Provider, MD  hydrochlorothiazide (MICROZIDE) 12.5 MG capsule Take 12.5 mg by mouth daily.    Historical Provider, MD   ipratropium-albuterol (DUONEB) 0.5-2.5 (3) MG/3ML SOLN Take 3 mLs by nebulization every 6 (six) hours as needed. For wheezing    Historical Provider, MD  loratadine (CLARITIN) 10 MG tablet Take 10 mg by mouth daily as needed. For allergies    Historical Provider, MD  losartan-hydrochlorothiazide (HYZAAR) 50-12.5 MG per tablet Take 1 tablet by mouth daily.    Historical Provider, MD  PROAIR HFA 108 (90 BASE) MCG/ACT inhaler  05/13/13   Historical Provider, MD  vitamin E 200 UNIT capsule Take 200 Units by mouth 2 (two) times daily.    Historical Provider, MD   BP 109/52  Pulse 107  Temp(Src) 98.3 F (36.8 C)  Resp 18  SpO2 95% Physical Exam  Nursing note and vitals reviewed. Constitutional: She is oriented to person, place, and time. She appears well-developed and well-nourished. No distress.  HENT:  Head: Atraumatic.  Eyes: Conjunctivae are normal. Pupils are equal, round, and reactive to light. No scleral icterus.  Neck: Normal range of motion. Neck supple. No tracheal deviation present.  Cardiovascular: Normal rate.   Pulmonary/Chest: Effort normal and breath sounds normal. No respiratory distress. She exhibits no tenderness.  Abdominal: Soft. Normal appearance. She exhibits no distension. There is no tenderness.  Musculoskeletal: She exhibits no edema.  CTLS spine, non tender, aligned, no step off. Tenderness and sts left wrist. Radial pulse 2+, normal cap refill in all digits. Good rom bil ext, no other pain or focal bony tenderness.   Neurological: She is alert and oriented to person, place, and time.  Motor intact bil, stre 5/5, sens intact.  LUE radial, median and ulnar nerve fxn intact, both motor and sens.   Skin: Skin is warm and dry. No rash noted. She is not diaphoretic.  Skin intact, no open wounds.   Psychiatric: She has a normal mood and affect.    ED Course  Procedures (including critical care time) Labs Review  Dg Wrist Complete Left  11/25/2013   CLINICAL DATA:   Fall.  Left wrist pain.  EXAM: LEFT WRIST - COMPLETE 3+ VIEW  COMPARISON:  None.  FINDINGS: The bones are diffusely osteopenic. There is a comminuted, intra-articular, mildly impacted fracture of the distal radius which demonstrates dorsal angulation as well as mild dorsal displacement of a fracture fragment. There is also a minimally displaced fracture through the ulnar styloid. There is no dislocation. Mild first CMC joint osteoarthrosis is noted. Soft tissue swelling is present about the wrist.  IMPRESSION: Fractures of the distal radius and ulna.   Electronically Signed   By: Logan Bores   On: 11/25/2013 17:24      MDM   Iv ns. Dilaudid .5 mg iv. zofran iv.  Elevate. Ice. Xrays.  Reviewed nursing notes and prior charts  for additional history. Dilaudid .5 iv.  Sugar tong splint to wrist. Pain improved. Radial pulse 2+.  Dr [place] Silos on call for hand surgery consulted - he evaluated pt in ED and discharge to home to follow up with him.        Mirna Mires, MD 11/25/13 [phone]

## 2013-11-25 NOTE — ED Notes (Signed)
Ortho called for splint  

## 2013-12-01 ENCOUNTER — Ambulatory Visit: Payer: Medicare PPO

## 2013-12-08 ENCOUNTER — Ambulatory Visit (INDEPENDENT_AMBULATORY_CARE_PROVIDER_SITE_OTHER): Payer: Medicare PPO | Admitting: General Surgery

## 2013-12-26 LAB — IFOBT (OCCULT BLOOD): IFOBT: POSITIVE

## 2014-01-01 ENCOUNTER — Telehealth: Payer: Self-pay | Admitting: Internal Medicine

## 2014-01-01 NOTE — Telephone Encounter (Signed)
Left a message for Dr. Philip Aspen nurse D.J. Looks as if patient has been seen at [facility] for iron def. Anemia. Just want to clarify if she needs to see Dr. Olevia Perches.

## 2014-01-02 NOTE — Telephone Encounter (Signed)
Spoke with DJ and she states she was not aware that the patient had been received Feraheme at [facility]. She states the patient told them she has not had a colonoscopy.  Gave her the date of last colonoscopy. She states Dr. Philip Aspen would like patient scheduled with Dr. Olevia Perches also. Scheduled on 01/20/14 at 8:45 AM with Dr. Olevia Perches. Left a message for Kim with appointment.

## 2014-01-05 ENCOUNTER — Ambulatory Visit (INDEPENDENT_AMBULATORY_CARE_PROVIDER_SITE_OTHER): Payer: Medicare PPO | Admitting: General Surgery

## 2014-01-05 ENCOUNTER — Other Ambulatory Visit: Payer: Medicare PPO

## 2014-01-05 ENCOUNTER — Ambulatory Visit: Payer: Medicare PPO | Admitting: Hematology and Oncology

## 2014-01-06 ENCOUNTER — Other Ambulatory Visit (HOSPITAL_COMMUNITY): Payer: Self-pay | Admitting: *Deleted

## 2014-01-07 ENCOUNTER — Ambulatory Visit (HOSPITAL_COMMUNITY)
Admission: RE | Admit: 2014-01-07 | Discharge: 2014-01-07 | Disposition: A | Discharge status: Home / Self Care | Payer: Medicare PPO | Source: Ambulatory Visit | Attending: Internal Medicine | Admitting: Internal Medicine

## 2014-01-07 DIAGNOSIS — D649 Anemia, unspecified: Secondary | ICD-10-CM | POA: Diagnosis not present

## 2014-01-07 MED ORDER — SODIUM CHLORIDE 0.9 % IV SOLN
510.0000 mg | INTRAVENOUS | Status: DC
  Administered 2014-01-07: 510 mg via INTRAVENOUS
  Filled 2014-01-07: qty 17

## 2014-01-08 ENCOUNTER — Encounter: Payer: Self-pay | Admitting: *Deleted

## 2014-01-09 ENCOUNTER — Encounter: Payer: Self-pay | Admitting: *Deleted

## 2014-01-14 ENCOUNTER — Encounter (HOSPITAL_COMMUNITY)
Admission: RE | Admit: 2014-01-14 | Discharge: 2014-01-14 | Disposition: A | Discharge status: Home / Self Care | Payer: Medicare PPO | Source: Ambulatory Visit | Attending: Internal Medicine | Admitting: Internal Medicine

## 2014-01-14 DIAGNOSIS — D509 Iron deficiency anemia, unspecified: Secondary | ICD-10-CM | POA: Insufficient documentation

## 2014-01-14 MED ORDER — SODIUM CHLORIDE 0.9 % IV SOLN
510.0000 mg | INTRAVENOUS | Status: DC
  Administered 2014-01-14: 510 mg via INTRAVENOUS
  Filled 2014-01-14: qty 17

## 2014-01-20 ENCOUNTER — Encounter: Payer: Self-pay | Admitting: Internal Medicine

## 2014-01-20 ENCOUNTER — Ambulatory Visit (INDEPENDENT_AMBULATORY_CARE_PROVIDER_SITE_OTHER): Payer: Medicare PPO | Admitting: Internal Medicine

## 2014-01-20 VITALS — BP 100/66 | HR 88 | Ht 64.5 in | Wt 218.2 lb

## 2014-01-20 DIAGNOSIS — D509 Iron deficiency anemia, unspecified: Secondary | ICD-10-CM

## 2014-01-20 DIAGNOSIS — R195 Other fecal abnormalities: Secondary | ICD-10-CM

## 2014-01-20 NOTE — Patient Instructions (Addendum)
You have been scheduled for an endoscopy. Please follow written instructions given to you at your visit today. If you use inhalers (even only as needed), please bring them with you on the day of your procedure. Your physician has requested that you go to www.startemmi.com and enter the access code given to you at your visit today. This web site gives a general overview about your procedure. However, you should still follow specific instructions given to you by our office regarding your preparation for the procedure.  CC:Dr Janie Morning

## 2014-01-20 NOTE — Progress Notes (Signed)
Ann Maxwell 25-Dec-1940 [id number]  Note: This dictation was prepared with Dragon digital system. Any transcriptional errors that result from this procedure are unintentional.   History of Present Illness:  This is a 73 year old white female with a history of iron deficiency anemia and breast cancer in April 2013. Last month, her home stool test was positive for blood (ordered by Dr Shon Baton office). She was found to have 3% iron saturation and hemoglobin of 12.0, hematocrit 37. We have done several colonoscopies in the past including 2005, 2008 when she had an adenomatous polyp removed and in March 2014 which showed inflammation at the ileocecal valve. Biopsies only revealed normal mucosa. She received an iron infusion last week and has been on iron supplements twice a day for a long time. Her hemoglobin 4 weeks ago was 11.3 and last week was down to 9. Iron saturation 3%. She denies abdominal pain. She has regular bowel habits with a mild tendency toward constipation. A CT scan of the abdomen in November 2009 showed right hydronephrosis due to 2 stones in the right ureter. She was taking Celebrex 200 mg a day and aspirin 81 mg a day until the time her stool tested positive for blood, then she stopped it.    Past Medical History  Diagnosis Date  . Asthma   . Hypertension   . Osteoarthritis   . Obesity   . Environmental allergies   . Vertigo   . Breast CA 2013    a. L breast DCIS, s/p L mastectomy 12/2011.  Marland Kitchen GERD (gastroesophageal reflux disease)     heartburn occasionally  . Anemia   . Internal hemorrhoids   . Iron deficiency anemia     Past Surgical History  Procedure Laterality Date  . Tonsillectomy and adenoidectomy    . Cholecystectomy    . Vaginal hysterectomy      with BSO  . Lithotripsy    . Lithectomy  2010  . Mastectomy w/ sentinel node biopsy  01/04/2012    Procedure: MASTECTOMY WITH SENTINEL LYMPH NODE BIOPSY;  Surgeon: Stark Klein, MD;  Location: [facility];   Service: General;  Laterality: Left;  . Breast surgery    . Orif tibia & fibula fractures      wtih subsequent pin removal in 1997  . Orif ulnar / radial shaft fracture      Allergies  Allergen Reactions  . Adhesive [Tape] Other (See Comments)    Surgical tape causes blisters  . Oxycodone-Acetaminophen Nausea Only    Pt stated not allergic to this  . Terramycin [Oxytetracycline] Rash    Family history and social history have been reviewed.  Review of Systems: Occasional heartburn. Denies dysphagia. No history of ulcers  The remainder of the 10 point ROS is negative except as outlined in the H&P  Physical Exam: General Appearance Well developed, in no distress Eyes  Non icteric  HEENT  Non traumatic, normocephalic  Mouth No lesion, tongue papillated, no cheilosis Neck Supple without adenopathy, thyroid not enlarged, no carotid bruits, no JVD Lungs Clear to auscultation bilaterally COR Normal S1, normal S2, regular rhythm, no murmur, quiet precordium Abdomen obese, soft, hyperactive bowel sounds. Right upper quadrant abdominal scar from cholecystectomy. No tenderness or mass Rectal small external hemorrhoidal tags. Soft Hemoccult-positive stool Extremities  No pedal edema, left arm cast Skin No lesions, no spider nevi or palmar erythema Neurological Alert and oriented x 3 Psychological Normal mood and affect  Assessment and Plan:   Problem #90 73 year old white  female with asymptomatic iron deficiency anemia and heme positive stool who is up-to-date on her colonoscopy. The etiology of the blood loss is not clear. She was on Celebrex and aspirin at that time. Possible Hiatal hernia with Cameron erosions,  avm's. We will proceed with an upper endoscopy first and if it is negative, we will then do a small bowel capsule endoscopy to look for AVMs or small bowel lesions. She will continue on the iron supplements and we will monitor her hemoglobin and hematocrit. If both diagnostic  studies are negative and she continues to be heme positive, we will have to repeat  colonoscopy.    Ann Maxwell 01/20/2014

## 2014-01-21 ENCOUNTER — Ambulatory Visit
Admission: RE | Admit: 2014-01-21 | Discharge: 2014-01-21 | Disposition: A | Discharge status: Home / Self Care | Payer: Medicare PPO | Source: Ambulatory Visit

## 2014-01-21 DIAGNOSIS — Z1231 Encounter for screening mammogram for malignant neoplasm of breast: Secondary | ICD-10-CM

## 2014-01-22 ENCOUNTER — Other Ambulatory Visit (INDEPENDENT_AMBULATORY_CARE_PROVIDER_SITE_OTHER): Payer: Self-pay | Admitting: General Surgery

## 2014-01-22 DIAGNOSIS — R928 Other abnormal and inconclusive findings on diagnostic imaging of breast: Secondary | ICD-10-CM

## 2014-01-27 ENCOUNTER — Ambulatory Visit
Admission: RE | Admit: 2014-01-27 | Discharge: 2014-01-27 | Disposition: A | Discharge status: Home / Self Care | Payer: Medicare PPO | Source: Ambulatory Visit | Attending: General Surgery | Admitting: General Surgery

## 2014-01-27 DIAGNOSIS — R928 Other abnormal and inconclusive findings on diagnostic imaging of breast: Secondary | ICD-10-CM

## 2014-01-28 ENCOUNTER — Other Ambulatory Visit (INDEPENDENT_AMBULATORY_CARE_PROVIDER_SITE_OTHER): Payer: Medicare PPO

## 2014-01-28 ENCOUNTER — Encounter: Payer: Self-pay | Admitting: Internal Medicine

## 2014-01-28 ENCOUNTER — Ambulatory Visit (AMBULATORY_SURGERY_CENTER): Payer: Medicare PPO | Admitting: Internal Medicine

## 2014-01-28 ENCOUNTER — Other Ambulatory Visit: Payer: Self-pay | Admitting: *Deleted

## 2014-01-28 VITALS — BP 136/58 | HR 78 | Temp 98.8°F | Resp 30 | Ht 64.5 in | Wt 218.0 lb

## 2014-01-28 DIAGNOSIS — D509 Iron deficiency anemia, unspecified: Secondary | ICD-10-CM

## 2014-01-28 DIAGNOSIS — K317 Polyp of stomach and duodenum: Secondary | ICD-10-CM

## 2014-01-28 LAB — CBC WITH DIFFERENTIAL/PLATELET
Basophils Absolute: 0 10*3/uL (ref 0.0–0.1)
Basophils Relative: 0.5 % (ref 0.0–3.0)
EOS ABS: 0.3 10*3/uL (ref 0.0–0.7)
Eosinophils Relative: 3.8 % (ref 0.0–5.0)
HCT: 37.7 % (ref 36.0–46.0)
HEMOGLOBIN: 11.9 g/dL — AB (ref 12.0–15.0)
Lymphocytes Relative: 14.2 % (ref 12.0–46.0)
Lymphs Abs: 0.9 10*3/uL (ref 0.7–4.0)
MCHC: 31.4 g/dL (ref 30.0–36.0)
MCV: 87.2 fl (ref 78.0–100.0)
MONO ABS: 0.5 10*3/uL (ref 0.1–1.0)
Monocytes Relative: 7.4 % (ref 3.0–12.0)
NEUTROS ABS: 4.9 10*3/uL (ref 1.4–7.7)
Neutrophils Relative %: 74.1 % (ref 43.0–77.0)
Platelets: 283 10*3/uL (ref 150.0–400.0)
RBC: 4.32 Mil/uL (ref 3.87–5.11)
RDW: 18.5 % — ABNORMAL HIGH (ref 11.5–15.5)
WBC: 6.7 10*3/uL (ref 4.0–10.5)

## 2014-01-28 MED ORDER — SODIUM CHLORIDE 0.9 % IV SOLN: 500.0000 mL | INTRAVENOUS | Status: DC

## 2014-01-28 NOTE — Patient Instructions (Signed)
YOU HAD AN ENDOSCOPIC PROCEDURE TODAY AT [facility]: Refer to the procedure report that was given to you for any specific questions about what was found during the examination.  If the procedure report does not answer your questions, please call your gastroenterologist to clarify.  If you requested that your care partner not be given the details of your procedure findings, then the procedure report has been included in a sealed envelope for you to review at your convenience later.  YOU SHOULD EXPECT: Some feelings of bloating in the abdomen. Passage of more gas than usual.  Walking can help get rid of the air that was put into your GI tract during the procedure and reduce the bloating.  DIET: Your first meal following the procedure should be a light meal and then it is ok to progress to your normal diet.  A half-sandwich or bowl of soup is an example of a good first meal.  Heavy or fried foods are harder to digest and may make you feel nauseous or bloated.  Likewise meals heavy in dairy and vegetables can cause extra gas to form and this can also increase the bloating.  Drink plenty of fluids but you should avoid alcoholic beverages for 24 hours.  ACTIVITY: Your care partner should take you home directly after the procedure.  You should plan to take it easy, moving slowly for the rest of the day.  You can resume normal activity the day after the procedure however you should NOT DRIVE or use heavy machinery for 24 hours (because of the sedation medicines used during the test).    SYMPTOMS TO REPORT IMMEDIATELY: A gastroenterologist can be reached at any hour.  During normal business hours, 8:30 AM to 5:00 PM Monday through Friday, call [phone].  After hours and on weekends, please call the GI answering service at [phone] who will take a message and have the physician on call contact you.   Following upper endoscopy (EGD)  Vomiting of blood or coffee ground material  New  chest pain or pain under the shoulder blades  Painful or persistently difficult swallowing  New shortness of breath  Fever of 100F or higher  Black, tarry-looking stools  FOLLOW UP: If any biopsies were taken you will be contacted by phone or by letter within the next 1-3 weeks.  Call your gastroenterologist if you have not heard about the biopsies in 3 weeks.  Our staff will call the home number listed on your records the next business day following your procedure to check on you and address any questions or concerns that you may have at that time regarding the information given to you following your procedure. This is a courtesy call and so if there is no answer at the home number and we have not heard from you through the emergency physician on call, we will assume that you have returned to your regular daily activities without incident.  SIGNATURES/CONFIDENTIALITY: You and/or your care partner have signed paperwork which will be entered into your electronic medical record.  These signatures attest to the fact that that the information above on your After Visit Summary has been reviewed and is understood.  Full responsibility of the confidentiality of this discharge information lies with you and/or your care-partner.  Await pathology  Please complete hemoccult cards and mail back to office  Please stay off Celebrex

## 2014-01-28 NOTE — Op Note (Signed)
[facility]  [address]. [place], [id number]   ENDOSCOPY PROCEDURE REPORT  PATIENT: Ann Maxwell, Ann Maxwell  MR#: [id number] BIRTHDATE: 12-30-1940 , 22  yrs. old GENDER: female ENDOSCOPIST: Lafayette Dragon, MD REFERRED BY:  Leanna Battles, M.D. PROCEDURE DATE:  01/28/2014 PROCEDURE:  EGD w/ biopsy ASA CLASS:     Class II INDICATIONS:  iron deficiency anemia.  3% iron saturation. Hemoccult-positive stool.  Patient was on Celebrex and aspirin. She is up-to-date on colonoscopy. MEDICATIONS: Monitored anesthesia care and Propofol 200 mg IV TOPICAL ANESTHETIC: none  DESCRIPTION OF PROCEDURE: After the risks benefits and alternatives of the procedure were thoroughly explained, informed consent was obtained.  The [id number] endoscope was introduced through the mouth and advanced to the second portion of the duodenum , Without limitations.  The instrument was slowly withdrawn as the mucosa was fully examined.   Esophagus:  proximal, mid and distal esophageal mucosa was normal. Squamocolumnar junction was normal. There was no stricture or esophagitis. Stomach: there were multiple large fundic gland polyps throughout the body of the stomach. These were rather friable and bled easily after biopsies were taken. Gastric antrum was unremarkable. Gastric outlet was normal. Retroflexion of the endoscope confirmed the presence of fundic gland polyps Duodenum: duodenal bulb and descending duodenum was normal. Biopsies were taken from second portion duodenum to rule out villous atrophy[          The scope was then withdrawn from the patient and the procedure completed.  COMPLICATIONS: There were no immediate complications.  ENDOSCOPIC IMPRESSION:   1.multiple large fundic gland polyps status post biopsies. Most likely source of intermittent blood loss 2.Small bowel biopsies to rule out villous atrophy No active bleeding site found   RECOMMENDATIONS: 1.  Await  pathology results 2.  Recheck hemoglobin today and follow stool Hemoccults. Patient will stay off Celebrex. I would hold off on doing colonoscopy since she just had a recent exam In case Hemoccult cards continued to be positive I would consider a small bowel capsule endoscopy  REPEAT EXAM:  eSigned:  Lafayette Dragon, MD 01/28/2014 11:56 AM    CC:  PATIENT NAME:  [name], [name] MR#: [id number]

## 2014-01-28 NOTE — Progress Notes (Signed)
Hemoccults given to pt and care partner with instructions

## 2014-01-28 NOTE — Progress Notes (Signed)
Called to room to assist during endoscopic procedure.  Patient ID and intended procedure confirmed with present staff. Received instructions for my participation in the procedure from the performing physician.  

## 2014-01-28 NOTE — Progress Notes (Signed)
Report to PACU, RN, vss, BBS= Clear.  

## 2014-01-29 ENCOUNTER — Telehealth: Payer: Self-pay | Admitting: *Deleted

## 2014-01-29 ENCOUNTER — Telehealth: Payer: Self-pay

## 2014-01-29 NOTE — Telephone Encounter (Signed)
  Follow up Call-  Call back number 01/28/2014 06/19/2012  Post procedure Call Back phone  # [phone]  Permission to leave phone message Yes Yes     Patient questions:  Do you have a fever, pain , or abdominal swelling? No. Pain Score  0 *  Have you tolerated food without any problems? Yes.    Have you been able to return to your normal activities? Yes.    Do you have any questions about your discharge instructions: Diet   No. Medications  No. Follow up visit  No.  Do you have questions or concerns about your Care? No.  Actions: * If pain score is 4 or above: No action needed, pain <4.  No problems noted per the pt. maw

## 2014-01-29 NOTE — Telephone Encounter (Signed)
Spoke with patient and she did receive hemoccult cards yesterday to complete in several weeks.

## 2014-01-30 ENCOUNTER — Other Ambulatory Visit: Payer: Self-pay

## 2014-02-02 ENCOUNTER — Encounter: Payer: Self-pay | Admitting: Internal Medicine

## 2014-02-02 ENCOUNTER — Other Ambulatory Visit: Payer: Medicare PPO

## 2014-02-16 ENCOUNTER — Other Ambulatory Visit (INDEPENDENT_AMBULATORY_CARE_PROVIDER_SITE_OTHER): Payer: Medicare PPO

## 2014-02-16 DIAGNOSIS — D509 Iron deficiency anemia, unspecified: Secondary | ICD-10-CM

## 2014-02-16 LAB — HEMOCCULT SLIDES (X 3 CARDS)
Fecal Occult Blood: POSITIVE — AB
OCCULT 1: POSITIVE — AB
OCCULT 2: POSITIVE — AB
OCCULT 3: POSITIVE — AB
OCCULT 4: POSITIVE — AB
OCCULT 5: POSITIVE — AB

## 2014-02-20 ENCOUNTER — Encounter: Payer: Self-pay | Admitting: Hematology and Oncology

## 2014-02-23 NOTE — Progress Notes (Signed)
Per pt request, pof sent to move lab appt up one week.

## 2014-02-24 ENCOUNTER — Telehealth: Payer: Self-pay

## 2014-02-24 ENCOUNTER — Telehealth: Payer: Self-pay | Admitting: Hematology and Oncology

## 2014-02-24 NOTE — Telephone Encounter (Signed)
Patient here for capsule endo teaching. Verbalizes understanding for all written and verbal instructions. 

## 2014-02-26 ENCOUNTER — Ambulatory Visit (INDEPENDENT_AMBULATORY_CARE_PROVIDER_SITE_OTHER): Payer: Medicare PPO | Admitting: Internal Medicine

## 2014-02-26 DIAGNOSIS — R195 Other fecal abnormalities: Secondary | ICD-10-CM

## 2014-02-26 DIAGNOSIS — D6489 Other specified anemias: Secondary | ICD-10-CM

## 2014-02-26 NOTE — Progress Notes (Signed)
Patient here today for capsule endo.  She verbalized understanding of all written and verbal instructions.  Capsule ID [id number] lot # [id number] exp 02/2015

## 2014-03-03 ENCOUNTER — Encounter: Payer: Self-pay | Admitting: *Deleted

## 2014-03-03 ENCOUNTER — Other Ambulatory Visit: Payer: Self-pay | Admitting: *Deleted

## 2014-03-03 ENCOUNTER — Telehealth: Payer: Self-pay | Admitting: *Deleted

## 2014-03-03 ENCOUNTER — Other Ambulatory Visit (INDEPENDENT_AMBULATORY_CARE_PROVIDER_SITE_OTHER): Payer: Medicare PPO

## 2014-03-03 DIAGNOSIS — K6389 Other specified diseases of intestine: Secondary | ICD-10-CM

## 2014-03-03 LAB — BASIC METABOLIC PANEL
BUN: 18 mg/dL (ref 6–23)
CALCIUM: 9.2 mg/dL (ref 8.4–10.5)
CO2: 29 meq/L (ref 19–32)
Chloride: 101 mEq/L (ref 96–112)
Creatinine, Ser: 0.8 mg/dL (ref 0.4–1.2)
GFR: 79.67 mL/min (ref >60.00)
GLUCOSE: 134 mg/dL — AB (ref 70–99)
Potassium: 3.8 mEq/L (ref 3.5–5.1)
Sodium: 137 mEq/L (ref 135–145)

## 2014-03-03 NOTE — Telephone Encounter (Signed)
Per Dr. Olevia Perches, patient needs CT scan of abdomen, pelvis with and without. Bmet also. R/O cecal mass. Scheduled at [facility] with Lattie Haw on 03/06/14 at 9:30 AM. Contrast at 7:30 AM and 8:30 AM. NPO 4 hours prior except water with medications. Patient given appointment date, time and instructions. Contrast and written instructions up front for pick up. Patient is asking for more information as to why CT is scheduled. Spoke with Dr. Olevia Perches and she will call patient.

## 2014-03-04 ENCOUNTER — Encounter: Payer: Self-pay | Admitting: Internal Medicine

## 2014-03-06 ENCOUNTER — Other Ambulatory Visit: Payer: Self-pay | Admitting: *Deleted

## 2014-03-06 ENCOUNTER — Telehealth: Payer: Self-pay | Admitting: *Deleted

## 2014-03-06 ENCOUNTER — Ambulatory Visit (INDEPENDENT_AMBULATORY_CARE_PROVIDER_SITE_OTHER)
Admission: RE | Admit: 2014-03-06 | Discharge: 2014-03-06 | Disposition: A | Discharge status: Home / Self Care | Payer: Medicare PPO | Source: Ambulatory Visit | Attending: Internal Medicine | Admitting: Internal Medicine

## 2014-03-06 DIAGNOSIS — K6389 Other specified diseases of intestine: Secondary | ICD-10-CM

## 2014-03-06 MED ORDER — IOHEXOL 300 MG/ML  SOLN
100.0000 mL | Freq: Once | INTRAMUSCULAR | Status: AC | PRN
  Administered 2014-03-06: 100 mL via INTRAVENOUS

## 2014-03-06 NOTE — Telephone Encounter (Signed)
Patient is asking to speak directly to Dr. Olevia Perches. She has been scheduled for her colonoscopy on 03/20/14 at 9:30 AM.

## 2014-03-09 ENCOUNTER — Ambulatory Visit (AMBULATORY_SURGERY_CENTER): Payer: Self-pay | Admitting: *Deleted

## 2014-03-09 VITALS — Ht 64.5 in | Wt 217.0 lb

## 2014-03-09 DIAGNOSIS — R933 Abnormal findings on diagnostic imaging of other parts of digestive tract: Secondary | ICD-10-CM

## 2014-03-09 MED ORDER — MOVIPREP 100 G PO SOLR: ORAL | Status: DC

## 2014-03-09 NOTE — Progress Notes (Signed)
Patient denies any allergies to eggs or soy. Patient denies any problems with anesthesia/sedation. Patient denies any oxygen use at home and does not take any diet/weight loss medications. EMMI education assisgned to patient on colonoscopy, this was explained and instructions given to patient. Pt concerned/upset about CT scan results that states "ovaries are atrophic", patient states she has had hysterectomy with bil. oophorectomy 20 years ago. Patient wants Dr.Brodie to be aware of this. Copy of CT scan made and placed with patient's chart w/ attention Dr.Brodie.

## 2014-03-10 NOTE — Telephone Encounter (Signed)
Spoke with MD and she does want patient to have colonoscopy. Patient notified.

## 2014-03-10 NOTE — Telephone Encounter (Signed)
Patient notified of of appointment with Dr. Barry Dienes on 03/23/14 at 10:30 AM. She is asking if Dr. Olevia Perches wants her to have the colonoscopy on 03/20/14. Please, advise.

## 2014-03-10 NOTE — Telephone Encounter (Signed)
Spoke with [facility] and scheduled OV on 03/23/14 at 10:30 AM with Dr. Barry Dienes.

## 2014-03-10 NOTE — Telephone Encounter (Signed)
I have spoken to Mrs Dooley, she would like to go ahead and make appointment with Dr Juliene Pina, who is her surgeon. Reason for consult is ? Mass ileocecal valve on CT and on SBCE. She is for colon 03/20/2014.Please call pt with the appointment

## 2014-03-10 NOTE — Telephone Encounter (Signed)
I have spoken to the pt, please set up appointm with Dr Barry Dienes.

## 2014-03-20 ENCOUNTER — Encounter: Payer: Self-pay | Admitting: Internal Medicine

## 2014-03-20 ENCOUNTER — Ambulatory Visit (AMBULATORY_SURGERY_CENTER): Payer: Medicare PPO | Admitting: Internal Medicine

## 2014-03-20 VITALS — BP 131/64 | HR 89 | Temp 99.7°F | Resp 20 | Ht 64.5 in | Wt 217.0 lb

## 2014-03-20 DIAGNOSIS — C180 Malignant neoplasm of cecum: Secondary | ICD-10-CM

## 2014-03-20 DIAGNOSIS — K6389 Other specified diseases of intestine: Secondary | ICD-10-CM

## 2014-03-20 DIAGNOSIS — R198 Other specified symptoms and signs involving the digestive system and abdomen: Secondary | ICD-10-CM

## 2014-03-20 DIAGNOSIS — R933 Abnormal findings on diagnostic imaging of other parts of digestive tract: Secondary | ICD-10-CM

## 2014-03-20 MED ORDER — SODIUM CHLORIDE 0.9 % IV SOLN: 500.0000 mL | INTRAVENOUS | Status: DC

## 2014-03-20 NOTE — Progress Notes (Signed)
A/ox3 pleased with MAC, report to Celia RN 

## 2014-03-20 NOTE — Progress Notes (Signed)
Called to room to assist during endoscopic procedure.  Patient ID and intended procedure confirmed with present staff. Received instructions for my participation in the procedure from the performing physician.  

## 2014-03-20 NOTE — Patient Instructions (Signed)
Discharge instructions given. Biopsies taken. Resume previous medications. YOU HAD AN ENDOSCOPIC PROCEDURE TODAY AT THE [facility]: Refer to the procedure report that was given to you for any specific questions about what was found during the examination.  If the procedure report does not answer your questions, please call your gastroenterologist to clarify.  If you requested that your care partner not be given the details of your procedure findings, then the procedure report has been included in a sealed envelope for you to review at your convenience later.  YOU SHOULD EXPECT: Some feelings of bloating in the abdomen. Passage of more gas than usual.  Walking can help get rid of the air that was put into your GI tract during the procedure and reduce the bloating. If you had a lower endoscopy (such as a colonoscopy or flexible sigmoidoscopy) you may notice spotting of blood in your stool or on the toilet paper. If you underwent a bowel prep for your procedure, then you may not have a normal bowel movement for a few days.  DIET: Your first meal following the procedure should be a light meal and then it is ok to progress to your normal diet.  A half-sandwich or bowl of soup is an example of a good first meal.  Heavy or fried foods are harder to digest and may make you feel nauseous or bloated.  Likewise meals heavy in dairy and vegetables can cause extra gas to form and this can also increase the bloating.  Drink plenty of fluids but you should avoid alcoholic beverages for 24 hours.  ACTIVITY: Your care partner should take you home directly after the procedure.  You should plan to take it easy, moving slowly for the rest of the day.  You can resume normal activity the day after the procedure however you should NOT DRIVE or use heavy machinery for 24 hours (because of the sedation medicines used during the test).    SYMPTOMS TO REPORT IMMEDIATELY: A gastroenterologist can be reached at any  hour.  During normal business hours, 8:30 AM to 5:00 PM Monday through Friday, call [phone].  After hours and on weekends, please call the GI answering service at [phone] who will take a message and have the physician on call contact you.   Following lower endoscopy (colonoscopy or flexible sigmoidoscopy):  Excessive amounts of blood in the stool  Significant tenderness or worsening of abdominal pains  Swelling of the abdomen that is new, acute  Fever of 100F or higher  FOLLOW UP: If any biopsies were taken you will be contacted by phone or by letter within the next 1-3 weeks.  Call your gastroenterologist if you have not heard about the biopsies in 3 weeks.  Our staff will call the home number listed on your records the next business day following your procedure to check on you and address any questions or concerns that you may have at that time regarding the information given to you following your procedure. This is a courtesy call and so if there is no answer at the home number and we have not heard from you through the emergency physician on call, we will assume that you have returned to your regular daily activities without incident.  SIGNATURES/CONFIDENTIALITY: You and/or your care partner have signed paperwork which will be entered into your electronic medical record.  These signatures attest to the fact that that the information above on your After Visit Summary has been reviewed and is understood.    Full responsibility of the confidentiality of this discharge information lies with you and/or your care-partner.

## 2014-03-23 ENCOUNTER — Other Ambulatory Visit: Payer: Self-pay | Admitting: *Deleted

## 2014-03-23 ENCOUNTER — Other Ambulatory Visit (INDEPENDENT_AMBULATORY_CARE_PROVIDER_SITE_OTHER): Payer: Self-pay | Admitting: General Surgery

## 2014-03-23 ENCOUNTER — Telehealth: Payer: Self-pay

## 2014-03-23 ENCOUNTER — Telehealth: Payer: Self-pay | Admitting: *Deleted

## 2014-03-23 DIAGNOSIS — C189 Malignant neoplasm of colon, unspecified: Secondary | ICD-10-CM

## 2014-03-23 DIAGNOSIS — C50519 Malignant neoplasm of lower-outer quadrant of unspecified female breast: Secondary | ICD-10-CM

## 2014-03-23 NOTE — Telephone Encounter (Signed)
Pt had an appointment with Dr Barry Dienes today.

## 2014-03-23 NOTE — Telephone Encounter (Signed)
Received a call from Pathology Department with [facility]. Will be releasing report. Adenocarcinoma. Appears to be colon origin.

## 2014-03-23 NOTE — Telephone Encounter (Signed)
  Follow up Call-  Call back number 03/20/2014 01/28/2014 06/19/2012  Post procedure Call Back phone  # [phone]  Permission to leave phone message Yes Yes Yes     Patient questions:  Do you have a fever, pain , or abdominal swelling? No. Pain Score  0 *  Have you tolerated food without any problems? Yes.    Have you been able to return to your normal activities? Yes.    Do you have any questions about your discharge instructions: Diet   No. Medications  No. Follow up visit  No.  Do you have questions or concerns about your Care? No.  Actions: * If pain score is 4 or above: No action needed, pain <4.

## 2014-03-24 ENCOUNTER — Telehealth (INDEPENDENT_AMBULATORY_CARE_PROVIDER_SITE_OTHER): Payer: Self-pay

## 2014-03-24 ENCOUNTER — Other Ambulatory Visit: Payer: Self-pay | Admitting: *Deleted

## 2014-03-24 ENCOUNTER — Ambulatory Visit (HOSPITAL_BASED_OUTPATIENT_CLINIC_OR_DEPARTMENT_OTHER): Payer: Medicare PPO

## 2014-03-24 ENCOUNTER — Other Ambulatory Visit: Payer: Medicare PPO

## 2014-03-24 DIAGNOSIS — Z853 Personal history of malignant neoplasm of breast: Secondary | ICD-10-CM

## 2014-03-24 DIAGNOSIS — C50519 Malignant neoplasm of lower-outer quadrant of unspecified female breast: Secondary | ICD-10-CM

## 2014-03-24 DIAGNOSIS — D509 Iron deficiency anemia, unspecified: Secondary | ICD-10-CM

## 2014-03-24 LAB — COMPREHENSIVE METABOLIC PANEL (CC13)
ALBUMIN: 3.3 g/dL — AB (ref 3.5–5.0)
ALT: 10 U/L (ref 0–55)
AST: 11 U/L (ref 5–34)
Alkaline Phosphatase: 100 U/L (ref 40–150)
Anion Gap: 10 mEq/L (ref 3–11)
BUN: 14.3 mg/dL (ref 7.0–26.0)
CALCIUM: 9.3 mg/dL (ref 8.4–10.4)
CHLORIDE: 104 meq/L (ref 98–109)
CO2: 28 mEq/L (ref 22–29)
Creatinine: 0.7 mg/dL (ref 0.6–1.1)
EGFR: 81 mL/min/{1.73_m2} — ABNORMAL LOW (ref >90)
Glucose: 97 mg/dl (ref 70–140)
Potassium: 4.1 mEq/L (ref 3.5–5.1)
Sodium: 141 mEq/L (ref 136–145)
Total Bilirubin: <0.2 mg/dL (ref 0.20–1.20)
Total Protein: 6.2 g/dL — ABNORMAL LOW (ref 6.4–8.3)

## 2014-03-24 LAB — CBC WITH DIFFERENTIAL/PLATELET
BASO%: 1 % (ref 0.0–2.0)
BASOS ABS: 0.1 10*3/uL (ref 0.0–0.1)
EOS ABS: 0.4 10*3/uL (ref 0.0–0.5)
EOS%: 6 % (ref 0.0–7.0)
HCT: 29.4 % — ABNORMAL LOW (ref 34.8–46.6)
HEMOGLOBIN: 8.9 g/dL — AB (ref 11.6–15.9)
LYMPH%: 12.7 % — ABNORMAL LOW (ref 14.0–49.7)
MCH: 24.1 pg — ABNORMAL LOW (ref 25.1–34.0)
MCHC: 30.4 g/dL — ABNORMAL LOW (ref 31.5–36.0)
MCV: 79.2 fL — ABNORMAL LOW (ref 79.5–101.0)
MONO#: 0.6 10*3/uL (ref 0.1–0.9)
MONO%: 8.2 % (ref 0.0–14.0)
NEUT#: 5 10*3/uL (ref 1.5–6.5)
NEUT%: 72.1 % (ref 38.4–76.8)
PLATELETS: 354 10*3/uL (ref 145–400)
RBC: 3.72 10*6/uL (ref 3.70–5.45)
RDW: 17.2 % — ABNORMAL HIGH (ref 11.2–14.5)
WBC: 6.9 10*3/uL (ref 3.9–10.3)
lymph#: 0.9 10*3/uL (ref 0.9–3.3)

## 2014-03-24 LAB — IRON AND TIBC CHCC
%SAT: 3 % — ABNORMAL LOW (ref 21–57)
Iron: 15 ug/dL — ABNORMAL LOW (ref 41–142)
TIBC: 430 ug/dL (ref 236–444)
UIBC: 415 ug/dL — AB (ref 120–384)

## 2014-03-24 LAB — FERRITIN CHCC: Ferritin: 8 ng/ml — ABNORMAL LOW (ref 9–269)

## 2014-03-24 LAB — PROTHROMBIN TIME
INR: 1 (ref <1.50)
Prothrombin Time: 13.3 seconds (ref 11.6–15.2)

## 2014-03-24 NOTE — Telephone Encounter (Signed)
Pt scheduled for CT of the chest on 03/26/14 at [facility].  She will have labs drawn today at [facility].

## 2014-03-25 LAB — CEA: CEA: 5.1 ng/mL — ABNORMAL HIGH (ref 0.0–5.0)

## 2014-03-25 NOTE — Op Note (Addendum)
[place]  [address] [place], [id number]   COLONOSCOPY PROCEDURE REPORT  PATIENT: Ann Maxwell, Ann Maxwell  MR#: [id number] BIRTHDATE: 1940-09-29 , 34  yrs. old GENDER: female ENDOSCOPIST: Lafayette Dragon, MD REFERRED ZE:[id number] Philip Aspen, M.D. , Dr Mamie Laurel PROCEDURE DATE:  03/20/2014 PROCEDURE:   Colonoscopy with biopsy First Screening Colonoscopy - Avg.  risk and is 50 yrs.  old or older - No.  Prior Negative Screening - Now for repeat screening. N/A  History of Adenoma - Now for follow-up colonoscopy & has been > or = to 3 yrs.  N/A  Polyps Removed Today? Yes. ASA CLASS:   Class II INDICATIONS:abnormal SBCE- ? mass in distal ileum/cecum, last colonoscopy 06/2012 - ulcer at the ileo cecal valve, biopsies showed lymphoid aggregates, pt has been anemic, CT scan confirms cecal mass. MEDICATIONS: Monitored anesthesia care and Propofol 200 mg IV  DESCRIPTION OF PROCEDURE:   After the risks benefits and alternatives of the procedure were thoroughly explained, informed consent was obtained.  The digital rectal exam revealed no abnormalities of the rectum.   The LB [id number]  endoscope was introduced through the anus and advanced to the cecum, which was identified by the ileocecal valve. No adverse events experienced.   The quality of the prep was good, using MoviPrep The instrument was then slowly withdrawn as the colon was fully examined.      COLON FINDINGS: A medium ulcerated mass with friable surfaces was found at the ileocecal valve.  Multiple biopsies were performed using cold forceps. Appendiceal opening not  seen due to the mass, unable to enter TI due to the mass obscuring the  valve opening Retroflexed views revealed no abnormalities. The time to cecum=8 minutes 10 seconds.  Withdrawal time=7 minutes 26 seconds.  The scope was withdrawn and the procedure completed. COMPLICATIONS: There were no immediate complications.  ENDOSCOPIC IMPRESSION: Medium  size mass was found at the ileocecal valve; multiple biopsies were performed using cold forceps, sent [facility], mass consistent with carcinoma,  RECOMMENDATIONS: Await pathology results Pt has an appointment with Dr Barry Dienes on 03/23/2014 Unfortunately our photodocumentation system was down today. uanble to take photos Recall colon 1 year continue Iron supplements  eSigned:  Lafayette Dragon, MD 03/23/2014 5:11 PM Revised: 03/23/2014 5:11 PM  cc: no photodocumentation available today

## 2014-03-26 ENCOUNTER — Ambulatory Visit
Admission: RE | Admit: 2014-03-26 | Discharge: 2014-03-26 | Disposition: A | Discharge status: Home / Self Care | Payer: Medicare PPO | Source: Ambulatory Visit | Attending: General Surgery | Admitting: General Surgery

## 2014-03-26 ENCOUNTER — Telehealth: Payer: Self-pay | Admitting: *Deleted

## 2014-03-26 DIAGNOSIS — C189 Malignant neoplasm of colon, unspecified: Secondary | ICD-10-CM

## 2014-03-26 MED ORDER — IOHEXOL 300 MG/ML  SOLN
75.0000 mL | Freq: Once | INTRAMUSCULAR | Status: AC | PRN
  Administered 2014-03-26: 75 mL via INTRAVENOUS

## 2014-03-26 NOTE — Telephone Encounter (Signed)
Received a message from [facility] requesting a genetic appt.  Called pt and confirmed 04/02/14 genetic appt.  Took office note to HIM to scan.  Emailed Engineer, civil (consulting) at [employer] to make her aware.

## 2014-03-31 ENCOUNTER — Ambulatory Visit (HOSPITAL_BASED_OUTPATIENT_CLINIC_OR_DEPARTMENT_OTHER): Payer: Medicare PPO | Admitting: Hematology and Oncology

## 2014-03-31 ENCOUNTER — Telehealth: Payer: Self-pay | Admitting: Hematology and Oncology

## 2014-03-31 ENCOUNTER — Other Ambulatory Visit: Payer: Medicare PPO

## 2014-03-31 ENCOUNTER — Ambulatory Visit (HOSPITAL_BASED_OUTPATIENT_CLINIC_OR_DEPARTMENT_OTHER): Payer: Medicare PPO

## 2014-03-31 VITALS — BP 129/79 | HR 121 | Temp 98.2°F | Resp 18 | Ht 64.5 in | Wt 217.5 lb

## 2014-03-31 DIAGNOSIS — C50519 Malignant neoplasm of lower-outer quadrant of unspecified female breast: Secondary | ICD-10-CM

## 2014-03-31 DIAGNOSIS — D500 Iron deficiency anemia secondary to blood loss (chronic): Secondary | ICD-10-CM

## 2014-03-31 DIAGNOSIS — C189 Malignant neoplasm of colon, unspecified: Secondary | ICD-10-CM | POA: Insufficient documentation

## 2014-03-31 DIAGNOSIS — D0512 Intraductal carcinoma in situ of left breast: Secondary | ICD-10-CM

## 2014-03-31 DIAGNOSIS — C180 Malignant neoplasm of cecum: Secondary | ICD-10-CM

## 2014-03-31 MED ORDER — GABAPENTIN 100 MG PO CAPS: 100.0000 mg | ORAL_CAPSULE | Freq: Three times a day (TID) | ORAL | Status: DC

## 2014-03-31 MED ORDER — SODIUM CHLORIDE 0.9 % IV SOLN
Freq: Once | INTRAVENOUS | Status: AC
  Administered 2014-03-31: 13:00:00 via INTRAVENOUS

## 2014-03-31 MED ORDER — ANASTROZOLE 1 MG PO TABS: 1.0000 mg | ORAL_TABLET | Freq: Every day | ORAL | Status: DC

## 2014-03-31 MED ORDER — FLUTICASONE-SALMETEROL 250-50 MCG/DOSE IN AEPB: 1.0000 | INHALATION_SPRAY | Freq: Two times a day (BID) | RESPIRATORY_TRACT | Status: DC

## 2014-03-31 MED ORDER — SODIUM CHLORIDE 0.9 % IV SOLN
510.0000 mg | Freq: Once | INTRAVENOUS | Status: AC
  Administered 2014-03-31: 510 mg via INTRAVENOUS
  Filled 2014-03-31: qty 17

## 2014-03-31 NOTE — Assessment & Plan Note (Signed)
Cecum adenocarcinoma: Patient had long-standing iron deficiency anemia 2 years ago had a colonoscopy which was normal. Recently her primary care physician worked her up for Hemoccult-positive stools and she underwent upper endoscopy and capsule endoscopy that revealed a bleeding from the cecum. She then underwent a CT of the abdomen that revealed a large cecal mass. This led to a colonoscopy that revealed adenocarcinoma involving the cecum. She is going to undergo surgery on 04/22/2013 by Dr. Barry Dienes. Her preoperative CEA is 5.1.  Counseling: I discussed the staging process of colon cancer taking into consideration the depth of thickness of the colon cancer as well as the number of lymph nodes that are involved. I reviewed the CT of the chest that showed bilateral lung nodules which are stable from 2010 in addition to pancreatic tail pseudocyst. I do not believe that is any need to do additional workup at this time. She also has a small thoracic aortic aneurysm. CT of the abdomen apart from the enlarged lymph nodes along with a cecal mass otherwise is normal.  Return to clinic after surgery for follow-up to discuss adjuvant chemotherapy options if needed.

## 2014-03-31 NOTE — Patient Instructions (Signed)

## 2014-03-31 NOTE — Progress Notes (Signed)
Patient Care Team: Leanna Battles, MD as PCP - General (Internal Medicine)  DIAGNOSIS: left breast DCIS 10 cm, s/p Left mastectomy 12/2011   Staging form: Breast, AJCC 7th Edition     Clinical stage from 12/20/2011: Stage 0 (Tis, N0, cM0) - Unsigned     Pathologic: No stage assigned - Unsigned   SUMMARY OF ONCOLOGIC HISTORY:   Primary colon cancer   03/20/2014 Initial Diagnosis Invasive adenocarcinoma of Caecum    CHIEF COMPLIANT:   Newly diagnosed colon cancer  INTERVAL HISTORY: Ann Maxwell is a  73 year old Caucasian female with above-mentioned history of breast cancer but now has a history of newly diagnosed colon cancer. She has a long-standing history of fine deficiency anemia. 2 years ago she had a colonoscopy which is apparently normal. 3 months from that point she had  Anemia that required IV iron infusion. Over the past 2 years she required IV on at least 3 times. Most recently her primary care physician obtained a stool Hemoccults which were positive. This led to additional workup with an upper endoscopy and capsule endoscopy that revealed a bleeding of the sigmoid colon. She underwent a CT of the abdomen that revealed a sigmoid colon mass. She then underwent a colonoscopy that biopsy came back positive for cecal adenocarcinoma. She is here today preoperatively to discuss treatment options and adjuvant therapy options. She feels fatigued palpitations and and symptoms of anemia.  REVIEW OF SYSTEMS:   Constitutional: Denies fevers, chills or abnormal weight loss , fatigue Eyes: Denies blurriness of vision Ears, nose, mouth, throat, and face: Denies mucositis or sore throat Respiratory: Denies cough,  Shortness of breath exertion Cardiovascular: palpitation, chest discomfort or lower extremity swelling Gastrointestinal:  Denies nausea, heartburn or change in bowel habits Skin: Denies abnormal skin rashes Lymphatics: Denies new lymphadenopathy or easy bruising Neurological:Denies  numbness, tingling or new weaknesses Behavioral/Psych: Mood is stable, no new changes  Breast:  denies any pain or lumps or nodules in either breasts All other systems were reviewed with the patient and are negative.  I have reviewed the past medical history, past surgical history, social history and family history with the patient and they are unchanged from previous note.  ALLERGIES:  is allergic to hydrocodone; adhesive; and terramycin.  MEDICATIONS:  Current Outpatient Prescriptions  Medication Sig Dispense Refill  . acetaminophen (TYLENOL) 325 MG tablet Take 650 mg by mouth at bedtime as needed.     Marland Kitchen anastrozole (ARIMIDEX) 1 MG tablet Take 1 tablet (1 mg total) by mouth daily. 90 tablet 3  . cholecalciferol (VITAMIN D) 1000 UNITS tablet Take 2,000 Units by mouth daily.     . Fluticasone-Salmeterol (ADVAIR) 250-50 MCG/DOSE AEPB Inhale 1 puff into the lungs 2 (two) times daily. 60 each 1  . gabapentin (NEURONTIN) 100 MG capsule Take 1 capsule (100 mg total) by mouth 3 (three) times daily. 90 capsule 3  . GRAPE SEED EXTRACT PO Take 1 tablet by mouth daily.    Marland Kitchen ipratropium-albuterol (DUONEB) 0.5-2.5 (3) MG/3ML SOLN Take 3 mLs by nebulization every 6 (six) hours as needed. For wheezing    . iron polysaccharides (NIFEREX) 150 MG capsule Take 150 mg by mouth 2 (two) times daily.     Marland Kitchen loratadine (CLARITIN) 10 MG tablet Take 10 mg by mouth daily as needed. For allergies    . losartan-hydrochlorothiazide (HYZAAR) 50-12.5 MG per tablet Take 1 tablet by mouth daily.    . metroNIDAZOLE (FLAGYL) 500 MG tablet     . montelukast (  SINGULAIR) 10 MG tablet Take 10 mg by mouth daily.    Marland Kitchen neomycin (MYCIFRADIN) 500 MG tablet     . PROAIR HFA 108 (90 BASE) MCG/ACT inhaler Inhale 1-2 puffs into the lungs once as needed.     . vitamin E 200 UNIT capsule Take 200 Units by mouth 2 (two) times daily.    Marland Kitchen Bioflavonoid Products (PERIDIN-C PO) Take 2 tablets by mouth 2 (two) times daily. 200 mg.     No  current facility-administered medications for this visit.   Facility-Administered Medications Ordered in Other Visits  Medication Dose Route Frequency Provider Last Rate Last Dose  . ferumoxytol (FERAHEME) 510 mg in sodium chloride 0.9 % 100 mL IVPB  510 mg Intravenous Once Rulon Eisenmenger, MD        PHYSICAL EXAMINATION: ECOG PERFORMANCE STATUS: 1 - Symptomatic but completely ambulatory  Filed Vitals:   03/31/14 1102  BP: 129/79  Pulse: 121  Temp: 98.2 F (36.8 C)  Resp: 18   Filed Weights   03/31/14 1102  Weight: 217 lb 8 oz (98.657 kg)    GENERAL:alert, no distress and comfortable SKIN: skin color, texture, turgor are normal, no rashes or significant lesions EYES: normal, Conjunctiva are pink and non-injected, sclera clear OROPHARYNX:no exudate, no erythema and lips, buccal mucosa, and tongue normal  NECK: supple, thyroid normal size, non-tender, without nodularity LYMPH:  no palpable lymphadenopathy in the cervical, axillary or inguinal LUNGS: clear to auscultation and percussion with normal breathing effort HEART: regular rate & rhythm and no murmurs and no lower extremity edema ABDOMEN:abdomen soft, non-tender and normal bowel sounds Musculoskeletal:no cyanosis of digits and no clubbing  NEURO: alert & oriented x 3 with fluent speech, no focal motor/sensory deficits  LABORATORY DATA:  I have reviewed the data as listed   Chemistry      Component Value Date/Time   NA 141 03/24/2014 1052   NA 137 03/03/2014 1304   K 4.1 03/24/2014 1052   K 3.8 03/03/2014 1304   CL 101 03/03/2014 1304   CL 102 09/23/2012 1028   CO2 28 03/24/2014 1052   CO2 29 03/03/2014 1304   BUN 14.3 03/24/2014 1052   BUN 18 03/03/2014 1304   CREATININE 0.7 03/24/2014 1052   CREATININE 0.8 03/03/2014 1304      Component Value Date/Time   CALCIUM 9.3 03/24/2014 1052   CALCIUM 9.2 03/03/2014 1304   ALKPHOS 100 03/24/2014 1052   ALKPHOS 95 09/13/2012 0405   AST 11 03/24/2014 1052   AST 19  09/13/2012 0405   ALT 10 03/24/2014 1052   ALT 10 09/13/2012 0405   BILITOT <0.20 03/24/2014 1052   BILITOT 0.3 09/13/2012 0405       Lab Results  Component Value Date   WBC 6.9 03/24/2014   HGB 8.9* 03/24/2014   HCT 29.4* 03/24/2014   MCV 79.2* 03/24/2014   PLT 354 03/24/2014   NEUTROABS 5.0 03/24/2014     RADIOGRAPHIC STUDIES: I have personally reviewed the radiology reports and agreed with their findings.  CT chest abdomen and pelvis was reviewed  ASSESSMENT & PLAN:  Primary colon cancer Cecum adenocarcinoma: Patient had long-standing iron deficiency anemia 2 years ago had a colonoscopy which was normal. Recently her primary care physician worked her up for Hemoccult-positive stools and she underwent upper endoscopy and capsule endoscopy that revealed a bleeding from the cecum. She then underwent a CT of the abdomen that revealed a large cecal mass. This led to a colonoscopy that  revealed adenocarcinoma involving the cecum. She is going to undergo surgery on 04/22/2013 by Dr. Barry Dienes. Her preoperative CEA is 5.1.  Counseling: I discussed the staging process of colon cancer taking into consideration the depth of thickness of the colon cancer as well as the number of lymph nodes that are involved. I reviewed the CT of the chest that showed bilateral lung nodules which are stable from 2010 in addition to pancreatic tail pseudocyst. I do not believe that is any need to do additional workup at this time. She also has a small thoracic aortic aneurysm. CT of the abdomen apart from the enlarged lymph nodes along with a cecal mass otherwise is normal.  Return to clinic after surgery for follow-up to discuss adjuvant chemotherapy options if needed.  Iron deficiency anemia due to chronic blood loss  Iron deficiency anemia due to colon cancer. I would like to administered 2 doses of IV Feraheme for a hemoglobin of 8.9 with severe iron deficiency. She is symptomatic with occasional heart  palpitations and fatigue.  left breast DCIS 10 cm, s/p Left mastectomy 12/2011  Left breast cancer invasive ductal carcinoma: 0.5 cm with 9 mm area of DCIS status post left mastectomy ER/PR positive HER-2 negative Ki-67 17% currently on Arimidex 1 mg daily and tolerating it extremely well without any major problems. She takes gabapentin for hot flashes. I instructed her to stop  Vitamin E and riboflavin's for hot flashes.   No orders of the defined types were placed in this encounter.   The patient has a good understanding of the overall plan. she agrees with it. She will call with any problems that may develop before her next visit here.   Rulon Eisenmenger, MD 03/31/2014 1:12 PM

## 2014-03-31 NOTE — Telephone Encounter (Signed)
per TF to sch inf appt-per Kathrine Cords put it under 4:00 slot only one availabe-cld Terri to arrive

## 2014-03-31 NOTE — Assessment & Plan Note (Signed)
Iron deficiency anemia due to colon cancer. I would like to administered 2 doses of IV Feraheme for a hemoglobin of 8.9 with severe iron deficiency. She is symptomatic with occasional heart palpitations and fatigue.

## 2014-03-31 NOTE — Assessment & Plan Note (Signed)
Left breast cancer invasive ductal carcinoma: 0.5 cm with 9 mm area of DCIS status post left mastectomy ER/PR positive HER-2 negative Ki-67 17% currently on Arimidex 1 mg daily and tolerating it extremely well without any major problems. She takes gabapentin for hot flashes. I instructed her to stop  Vitamin E and riboflavin's for hot flashes.

## 2014-04-01 ENCOUNTER — Telehealth: Payer: Self-pay | Admitting: Hematology and Oncology

## 2014-04-01 ENCOUNTER — Encounter: Payer: Self-pay | Admitting: Hematology and Oncology

## 2014-04-01 NOTE — Progress Notes (Unsigned)
I CALLED [facility] [phone] AND SPOKE TO JESSICA SHE SAID THEY WOULD NOT DO THE PRE-CERT BECAUSE WE GAVE IT FOR DX CODE D50.

## 2014-04-01 NOTE — Telephone Encounter (Signed)
lmonvm for pt re appt for 12/22 and mailed schedule. no other appts added. no pof sent. pt was seen and added on for fera inf 03/31/14, then stopped by scheduling to have day 8 part of inf added. s/w terri today and confirmed that pt needed inf and asked that pof be sent for the inf and future appts. at this time no pof sent. mananged care alerted.

## 2014-04-02 ENCOUNTER — Other Ambulatory Visit: Payer: Medicare PPO

## 2014-04-02 ENCOUNTER — Ambulatory Visit (HOSPITAL_BASED_OUTPATIENT_CLINIC_OR_DEPARTMENT_OTHER): Payer: Medicare PPO | Admitting: Genetic Counselor

## 2014-04-02 ENCOUNTER — Encounter: Payer: Self-pay | Admitting: Genetic Counselor

## 2014-04-02 DIAGNOSIS — D0512 Intraductal carcinoma in situ of left breast: Secondary | ICD-10-CM

## 2014-04-02 DIAGNOSIS — Z315 Encounter for genetic counseling: Secondary | ICD-10-CM

## 2014-04-02 DIAGNOSIS — Z8041 Family history of malignant neoplasm of ovary: Secondary | ICD-10-CM

## 2014-04-02 DIAGNOSIS — C189 Malignant neoplasm of colon, unspecified: Secondary | ICD-10-CM

## 2014-04-02 DIAGNOSIS — C180 Malignant neoplasm of cecum: Secondary | ICD-10-CM

## 2014-04-02 NOTE — Progress Notes (Signed)
Patient Name: Ann Maxwell Patient Age: 73 y.o. Encounter Date: 04/02/2014  Referring Physician: Stark Klein, MD  Primary Care Provider: Donnajean Lopes, MD   Ann Maxwell, a 73 y.o. female, is being seen at the [facility] due to a personal and family history of cancer. She presents to clinic today with her daughter, Joellen Jersey, to discuss the possibility of a hereditary predisposition to cancer and discuss whether genetic testing is warranted.  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 She is scheduled for surgery on 04/28/13.  In 12/2011, she was diagnosed with left breast cancer (DCIS) and had a left mastectomy. The breast tumor was ER positive and PR positive.  [name] met with Ann Kayser, MS, [facility] for genetic counseling on 02/15/12 and pursued BRCA1/BRCA2 testing. No pathogenic mutations were identified through [employer].    Primary colon cancer   03/20/2014 Initial Diagnosis Invasive adenocarcinoma of [place]    Past Medical History  Diagnosis Date  . Asthma   . Hypertension   . Osteoarthritis   . Obesity   . Environmental allergies   . Vertigo   . Breast CA 2013    a. L breast DCIS, s/p L mastectomy 12/2011.  Marland Kitchen GERD (gastroesophageal reflux disease)     heartburn occasionally  . Anemia   . Internal hemorrhoids   . Iron deficiency anemia   . Colon cancer     Past Surgical History  Procedure Laterality Date  . Tonsillectomy and adenoidectomy    . Cholecystectomy    . Vaginal hysterectomy  20 yrs. ago    with BSO  . Lithotripsy    . Lithectomy  2010  . Mastectomy w/ sentinel node biopsy  01/04/2012    Procedure: MASTECTOMY WITH SENTINEL LYMPH NODE BIOPSY;  Surgeon: Stark Klein, MD;  Location: [place];  Service: General;  Laterality: Left;  . Breast surgery    . Orif tibia & fibula fractures      wtih subsequent pin removal in 1997  . Orif ulnar / radial shaft fracture      History    Social History  . Marital Status: Divorced    Spouse Name: N/A    Number of Children: N/A  . Years of Education: N/A   Occupational History  . retired Marine scientist    Social History Main Topics  . Smoking status: Never Smoker   . Smokeless tobacco: Never Used  . Alcohol Use: 0.0 oz/week    0 Not specified per week     Comment: occasionally - few times a month  . Drug Use: No  . Sexual Activity: No   Other Topics Concern  . Not on file   Social History Narrative   Retired Sports coach, 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 who is a Marine scientist     FAMILY HISTORY:   During the visit, a 4-generation pedigree was obtained. Family tree will be sent for scanning and will be in EPIC under the Media tab.  Significant diagnoses include the following:  Family History  Problem Relation Age of Onset  . Congestive Heart Failure Father     Died at 41  . Hypertension Father   . Heart disease Father   . Stroke Father   . Ovarian cancer Mother 55    deceased 83  . Uterine cancer Maternal Grandmother     late 37s  . Liver cancer Paternal Grandfather 42    deceased 42s  . Thyroid cancer Sister 26  currently 79  . Colon cancer Neg Hx   . Stomach cancer Neg Hx   . Esophageal cancer Neg Hx   . Rectal cancer Neg Hx    Her father died at 61, cancer-free and none of his siblings or their children had any cancer. Ann Maxwell mother had 5 brothers and a sister who were all cancer-free.  Ann Maxwell ancestry is [place]. There is no known Jewish ancestry and no consanguinity.  ASSESSMENT AND PLAN: Ann Maxwell is a 73 y.o. female with a personal history of breast cancer at 88 and colon cancer at 86. Even though she had two separate cancers, her history is not suggestive of a hereditary predisposition to cancer. She has already had negative BRCA1/BRCA2 testing. She stated that Lynch syndrome was brought to her attention due to her personal and family history. Given the ages at diagnosis of the colon and uterine cancers,  and the many unaffected maternal relatives she has, this is of low likelihood. We explained that, at this time, there is not enough evidence that breast cancer is part of the Lynch cancer spectrum. In fact, she does not meet Medicare's criteria for Lynch testing. Further, since Medicare already paid for BRCA testing, they will not pay for additional hereditary cancer testing, even if she did meet criteria.  At this time, no additional genetic testing was not recommended. We discussed that she can pursue testing if she would like, but would need to pay for it since Medicare will not. [facility] has a self-pay price of $475 if she wishes to proceed with testing. We also discussed that tumor testing (MSI and IHC) should be performed on the colon tumor from her surgery. If either of these tests is abnormal, we should revisit whether genetic testing should then be pursued. Ms. Glade was agreeable with this plan and stated she will discuss with Dr. Barry Dienes to make sure MSI and IHC are performed. She did not wish to have genetic testing today.   We encouraged Ann Maxwell to remain in contact with Cancer Genetics annually so that we can update the family history and inform her of any changes in cancer genetics and testing that may be of benefit for this family. Ms.  Maxwell questions were answered to her satisfaction today.   Thank you for the referral and allowing [place] to share in the care of your patient.   The patient was seen for a total of 30 minutes, greater than 50% of which was spent face-to-face counseling. This patient was discussed with the overseeing provider who agrees with the above.   Steele Berg, MS, [place] Certified Genetic Counselor phone: [phone] [email or web address]

## 2014-04-06 ENCOUNTER — Other Ambulatory Visit: Payer: Self-pay | Admitting: *Deleted

## 2014-04-06 DIAGNOSIS — C50519 Malignant neoplasm of lower-outer quadrant of unspecified female breast: Secondary | ICD-10-CM

## 2014-04-06 MED ORDER — FLUTICASONE-SALMETEROL 250-50 MCG/DOSE IN AEPB: 1.0000 | INHALATION_SPRAY | Freq: Two times a day (BID) | RESPIRATORY_TRACT | Status: DC

## 2014-04-07 ENCOUNTER — Ambulatory Visit (HOSPITAL_BASED_OUTPATIENT_CLINIC_OR_DEPARTMENT_OTHER): Payer: Medicare PPO

## 2014-04-07 DIAGNOSIS — D500 Iron deficiency anemia secondary to blood loss (chronic): Secondary | ICD-10-CM

## 2014-04-07 MED ORDER — SODIUM CHLORIDE 0.9 % IV SOLN
Freq: Once | INTRAVENOUS | Status: AC
  Administered 2014-04-07: 14:00:00 via INTRAVENOUS

## 2014-04-07 MED ORDER — SODIUM CHLORIDE 0.9 % IV SOLN
510.0000 mg | Freq: Once | INTRAVENOUS | Status: AC
  Administered 2014-04-07: 510 mg via INTRAVENOUS
  Filled 2014-04-07: qty 17

## 2014-04-07 NOTE — Patient Instructions (Signed)

## 2014-04-21 NOTE — Progress Notes (Signed)
Please put orders in Epic surgery 04-28-14 pre op 04-23-14 thanks

## 2014-04-23 ENCOUNTER — Other Ambulatory Visit: Payer: Self-pay

## 2014-04-23 ENCOUNTER — Encounter (HOSPITAL_COMMUNITY)
Admission: RE | Admit: 2014-04-23 | Discharge: 2014-04-23 | Disposition: A | Discharge status: Home / Self Care | Payer: Medicare PPO | Source: Ambulatory Visit | Attending: General Surgery | Admitting: General Surgery

## 2014-04-23 ENCOUNTER — Encounter (HOSPITAL_COMMUNITY): Payer: Self-pay

## 2014-04-23 DIAGNOSIS — I252 Old myocardial infarction: Secondary | ICD-10-CM | POA: Insufficient documentation

## 2014-04-23 DIAGNOSIS — I498 Other specified cardiac arrhythmias: Secondary | ICD-10-CM | POA: Diagnosis not present

## 2014-04-23 DIAGNOSIS — Z01818 Encounter for other preprocedural examination: Secondary | ICD-10-CM | POA: Diagnosis not present

## 2014-04-23 HISTORY — DX: Personal history of urinary calculi: Z87.442

## 2014-04-23 HISTORY — DX: Personal history of other medical treatment: Z92.89

## 2014-04-23 LAB — COMPREHENSIVE METABOLIC PANEL
ALT: 12 U/L (ref 0–35)
AST: 15 U/L (ref 0–37)
Albumin: 4 g/dL (ref 3.5–5.2)
Alkaline Phosphatase: 96 U/L (ref 39–117)
Anion gap: 6 (ref 5–15)
BILIRUBIN TOTAL: 0.6 mg/dL (ref 0.3–1.2)
BUN: 16 mg/dL (ref 6–23)
CHLORIDE: 103 meq/L (ref 96–112)
CO2: 29 mmol/L (ref 19–32)
Calcium: 9.3 mg/dL (ref 8.4–10.5)
Creatinine, Ser: 0.64 mg/dL (ref 0.50–1.10)
GFR calc Af Amer: >90 mL/min (ref >90)
GFR, EST NON AFRICAN AMERICAN: 88 mL/min — AB (ref >90)
GLUCOSE: 120 mg/dL — AB (ref 70–99)
POTASSIUM: 3.6 mmol/L (ref 3.5–5.1)
Sodium: 138 mmol/L (ref 135–145)
Total Protein: 7 g/dL (ref 6.0–8.3)

## 2014-04-23 LAB — CBC WITH DIFFERENTIAL/PLATELET
Basophils Absolute: 0.1 10*3/uL (ref 0.0–0.1)
Basophils Relative: 1 % (ref 0–1)
EOS PCT: 3 % (ref 0–5)
Eosinophils Absolute: 0.3 10*3/uL (ref 0.0–0.7)
HCT: 38.6 % (ref 36.0–46.0)
Hemoglobin: 11.3 g/dL — ABNORMAL LOW (ref 12.0–15.0)
LYMPHS ABS: 0.9 10*3/uL (ref 0.7–4.0)
Lymphocytes Relative: 10 % — ABNORMAL LOW (ref 12–46)
MCH: 25.4 pg — ABNORMAL LOW (ref 26.0–34.0)
MCHC: 29.3 g/dL — ABNORMAL LOW (ref 30.0–36.0)
MCV: 86.7 fL (ref 78.0–100.0)
Monocytes Absolute: 0.6 10*3/uL (ref 0.1–1.0)
Monocytes Relative: 6 % (ref 3–12)
Neutro Abs: 6.9 10*3/uL (ref 1.7–7.7)
Neutrophils Relative %: 80 % — ABNORMAL HIGH (ref 43–77)
Platelets: 338 10*3/uL (ref 150–400)
RBC: 4.45 MIL/uL (ref 3.87–5.11)
RDW: 20.3 % — ABNORMAL HIGH (ref 11.5–15.5)
WBC: 8.7 10*3/uL (ref 4.0–10.5)

## 2014-04-23 NOTE — Pre-Procedure Instructions (Addendum)
04-23-14 EKG done. CT Chest 03-26-14 Epic. 04-23-14 1600 Dr. Delma Post reviewed EKG of today and compared with 5'14, "will evaluate further AM of Surgery".

## 2014-04-23 NOTE — Patient Instructions (Addendum)
[place]  04/23/2014   Your procedure is scheduled on:   1-12 -2016 Tuesday  Enter through [facility]  Entrance and follow signs to [facility]. Arrive at   0530     AM.  Call this number if you have problems the morning of surgery: [phone]  Or Presurgical Testing [phone].   For Living Will and/or Health Care Power Attorney Forms: please provide copy for your medical record,may bring AM of surgery(Forms should be already notarized -we do not provide this service).(04-23-14  Yes, HCPOA document with medical records from previous visit).  Remember: Follow any bowel prep instructions per MD office.   Do not eat food/ or drink: After Midnight.      Take these medicines the morning of surgery with A SIP OF WATER: Arimidex .Gabapentin. Loratadine. Singulair. Use/bring Inhalers. Nebulizer if needed.   Do not wear jewelry, make-up or nail polish.  Do not wear deodorant, lotions, powders, or perfumes.   Do not shave legs and under arms- 48 hours(2 days) prior to first CHG shower.(Shaving face and neck okay.)  Do not bring valuables to the hospital.(Hospital is not responsible for lost valuables).  Contacts, dentures or removable bridgework, body piercing, hair pins may not be worn into surgery.  Leave suitcase in the car. After surgery it may be brought to your room.  For patients admitted to the hospital, checkout time is 11:00 AM the day of discharge.(Restricted visitors-Any Persons displaying flu-like symptoms or illness).    Patients discharged the day of surgery will not be allowed to drive home. Must have responsible person with you x 24 hours once discharged.  Name and phone number of your driver: Hector Brunswick, daughter [phone] cell     Please read over the following fact sheets that you were given:  CHG(Chlorhexidine Gluconate 4% Surgical Soap) use, MRSA Information, Blood Transfusion fact sheet, Incentive Spirometry Instruction.  Remember : Type/Screen  "Blue armbands" - may not be removed once applied(would result in being retested AM of surgery, if removed).         [place] - Preparing for Surgery Before surgery, you can play an important role.  Because skin is not sterile, your skin needs to be as free of germs as possible.  You can reduce the number of germs on your skin by washing with CHG (chlorahexidine gluconate) soap before surgery.  CHG is an antiseptic cleaner which kills germs and bonds with the skin to continue killing germs even after washing. Please DO NOT use if you have an allergy to CHG or antibacterial soaps.  If your skin becomes reddened/irritated stop using the CHG and inform your nurse when you arrive at Short Stay. Do not shave (including legs and underarms) for at least 48 hours prior to the first CHG shower.  You may shave your face/neck. Please follow these instructions carefully:  1.  Shower with CHG Soap the night before surgery and the  morning of Surgery.  2.  If you choose to wash your hair, wash your hair first as usual with your  normal  shampoo.  3.  After you shampoo, rinse your hair and body thoroughly to remove the  shampoo.                           4.  Use CHG as you would any other liquid soap.  You can apply chg directly  to the skin and wash  Gently with a scrungie or clean washcloth.  5.  Apply the CHG Soap to your body ONLY FROM THE NECK DOWN.   Do not use on face/ open                           Wound or open sores. Avoid contact with eyes, ears mouth and genitals (private parts).                       Wash face,  Genitals (private parts) with your normal soap.             6.  Wash thoroughly, paying special attention to the area where your surgery  will be performed.  7.  Thoroughly rinse your body with warm water from the neck down.  8.  DO NOT shower/wash with your normal soap after using and rinsing off  the CHG Soap.                9.  Pat yourself dry with a clean  towel.            10.  Wear clean pajamas.            11.  Place clean sheets on your bed the night of your first shower and do not  sleep with pets. Day of Surgery : Do not apply any lotions/deodorants the morning of surgery.  Please wear clean clothes to the hospital/surgery center.  FAILURE TO FOLLOW THESE INSTRUCTIONS MAY RESULT IN THE CANCELLATION OF YOUR SURGERY PATIENT SIGNATURE_________________________________  NURSE SIGNATURE__________________________________  ________________________________________________________________________

## 2014-04-26 ENCOUNTER — Other Ambulatory Visit (INDEPENDENT_AMBULATORY_CARE_PROVIDER_SITE_OTHER): Payer: Self-pay | Admitting: General Surgery

## 2014-04-27 ENCOUNTER — Encounter (HOSPITAL_COMMUNITY): Payer: Self-pay | Admitting: Anesthesiology

## 2014-04-27 NOTE — Anesthesia Preprocedure Evaluation (Addendum)
Anesthesia Evaluation  Patient identified by MRN, date of birth, ID band Patient awake    Reviewed: Allergy & Precautions, NPO status , Patient's Chart, lab work & pertinent test results  Airway Mallampati: II  TM Distance: >3 FB Neck ROM: Full    Dental no notable dental hx.    Pulmonary asthma ,  breath sounds clear to auscultation  Pulmonary exam normal       Cardiovascular hypertension, Pt. on medications + dysrhythmias Atrial Fibrillation Rhythm:Regular Rate:Normal  EKG and chest CT reviewed.   Neuro/Psych negative neurological ROS  negative psych ROS   GI/Hepatic Neg liver ROS, GERD-  Medicated,  Endo/Other  negative endocrine ROS  Renal/GU negative Renal ROS  negative genitourinary   Musculoskeletal  (+) Arthritis -,   Abdominal (+) + obese,   Peds negative pediatric ROS (+)  Hematology  (+) anemia ,   Anesthesia Other Findings Bursitis both shoulders, left greater than right  Reproductive/Obstetrics negative OB ROS                           Anesthesia Physical Anesthesia Plan  ASA: II  Anesthesia Plan: General   Post-op Pain Management:    Induction: Intravenous  Airway Management Planned: Oral ETT  Additional Equipment:   Intra-op Plan:   Post-operative Plan: Extubation in OR  Informed Consent: I have reviewed the patients History and Physical, chart, labs and discussed the procedure including the risks, benefits and alternatives for the proposed anesthesia with the patient or authorized representative who has indicated his/her understanding and acceptance.   Dental advisory given  Plan Discussed with: CRNA  Anesthesia Plan Comments:         Anesthesia Quick Evaluation

## 2014-04-28 ENCOUNTER — Inpatient Hospital Stay (HOSPITAL_COMMUNITY): Payer: Medicare PPO | Admitting: Anesthesiology

## 2014-04-28 ENCOUNTER — Encounter (HOSPITAL_COMMUNITY): Payer: Self-pay | Admitting: *Deleted

## 2014-04-28 ENCOUNTER — Encounter (HOSPITAL_COMMUNITY)
Admission: RE | Disposition: A | Discharge status: Home / Self Care | Payer: Self-pay | Source: Ambulatory Visit | Attending: General Surgery

## 2014-04-28 ENCOUNTER — Inpatient Hospital Stay (HOSPITAL_COMMUNITY)
Admission: RE | Admit: 2014-04-28 | Discharge: 2014-05-02 | DRG: 330 | Disposition: A | Discharge status: Home / Self Care | Payer: Medicare PPO | Source: Ambulatory Visit | Attending: General Surgery | Admitting: General Surgery

## 2014-04-28 DIAGNOSIS — Z01812 Encounter for preprocedural laboratory examination: Secondary | ICD-10-CM | POA: Diagnosis not present

## 2014-04-28 DIAGNOSIS — D62 Acute posthemorrhagic anemia: Secondary | ICD-10-CM | POA: Diagnosis not present

## 2014-04-28 DIAGNOSIS — K219 Gastro-esophageal reflux disease without esophagitis: Secondary | ICD-10-CM | POA: Diagnosis present

## 2014-04-28 DIAGNOSIS — Z9010 Acquired absence of unspecified breast and nipple: Secondary | ICD-10-CM | POA: Diagnosis present

## 2014-04-28 DIAGNOSIS — C180 Malignant neoplasm of cecum: Secondary | ICD-10-CM | POA: Diagnosis not present

## 2014-04-28 DIAGNOSIS — D500 Iron deficiency anemia secondary to blood loss (chronic): Secondary | ICD-10-CM | POA: Diagnosis present

## 2014-04-28 DIAGNOSIS — I4891 Unspecified atrial fibrillation: Secondary | ICD-10-CM | POA: Diagnosis present

## 2014-04-28 DIAGNOSIS — J449 Chronic obstructive pulmonary disease, unspecified: Secondary | ICD-10-CM | POA: Diagnosis present

## 2014-04-28 DIAGNOSIS — I10 Essential (primary) hypertension: Secondary | ICD-10-CM | POA: Diagnosis present

## 2014-04-28 DIAGNOSIS — Z79899 Other long term (current) drug therapy: Secondary | ICD-10-CM

## 2014-04-28 DIAGNOSIS — C19 Malignant neoplasm of rectosigmoid junction: Secondary | ICD-10-CM | POA: Diagnosis present

## 2014-04-28 HISTORY — PX: COLON RESECTION: SHX5231

## 2014-04-28 LAB — CBC
HEMATOCRIT: 34.9 % — AB (ref 36.0–46.0)
Hemoglobin: 10.6 g/dL — ABNORMAL LOW (ref 12.0–15.0)
MCH: 26.1 pg (ref 26.0–34.0)
MCHC: 30.4 g/dL (ref 30.0–36.0)
MCV: 86 fL (ref 78.0–100.0)
Platelets: 304 10*3/uL (ref 150–400)
RBC: 4.06 MIL/uL (ref 3.87–5.11)
RDW: 19.4 % — AB (ref 11.5–15.5)
WBC: 14.6 10*3/uL — ABNORMAL HIGH (ref 4.0–10.5)

## 2014-04-28 LAB — CREATININE, SERUM
CREATININE: 0.74 mg/dL (ref 0.50–1.10)
GFR calc non Af Amer: 84 mL/min — ABNORMAL LOW (ref >90)

## 2014-04-28 SURGERY — LAPAROSCOPIC RIGHT COLON RESECTION
Anesthesia: General | Site: Abdomen

## 2014-04-28 MED ORDER — SUCCINYLCHOLINE CHLORIDE 20 MG/ML IJ SOLN
INTRAMUSCULAR | Status: DC | PRN
  Administered 2014-04-28: 100 mg via INTRAVENOUS

## 2014-04-28 MED ORDER — NEOSTIGMINE METHYLSULFATE 10 MG/10ML IV SOLN
INTRAVENOUS | Status: AC
  Filled 2014-04-28: qty 1

## 2014-04-28 MED ORDER — METOCLOPRAMIDE HCL 5 MG/ML IJ SOLN
INTRAMUSCULAR | Status: AC
  Filled 2014-04-28: qty 2

## 2014-04-28 MED ORDER — DIPHENHYDRAMINE HCL 50 MG/ML IJ SOLN: 12.5000 mg | Freq: Four times a day (QID) | INTRAMUSCULAR | Status: DC | PRN

## 2014-04-28 MED ORDER — BUPIVACAINE-EPINEPHRINE 0.25% -1:200000 IJ SOLN
INTRAMUSCULAR | Status: DC | PRN
  Administered 2014-04-28: 20 mL

## 2014-04-28 MED ORDER — CIPROFLOXACIN IN D5W 400 MG/200ML IV SOLN
INTRAVENOUS | Status: AC
  Filled 2014-04-28: qty 200

## 2014-04-28 MED ORDER — METOCLOPRAMIDE HCL 5 MG/ML IJ SOLN
INTRAMUSCULAR | Status: DC | PRN
  Administered 2014-04-28: 10 mg via INTRAVENOUS

## 2014-04-28 MED ORDER — MORPHINE SULFATE (PF) 1 MG/ML IV SOLN
INTRAVENOUS | Status: DC
Start: 2014-04-28 — End: 2014-05-01
  Administered 2014-04-28: 1 mg via INTRAVENOUS
  Administered 2014-04-28: 10 mg via INTRAVENOUS
  Administered 2014-04-28: 3 mg via INTRAVENOUS
  Administered 2014-04-28: 23:00:00 via INTRAVENOUS
  Administered 2014-04-29: 1 mg via INTRAVENOUS
  Administered 2014-04-29: 16 mg via INTRAVENOUS
  Administered 2014-04-29: 8 mg via INTRAVENOUS
  Administered 2014-04-29: 6 mg via INTRAVENOUS
  Administered 2014-04-29: 10 mg via INTRAVENOUS
  Administered 2014-04-30: 13 mg via INTRAVENOUS
  Administered 2014-04-30 (×2): 15 mg via INTRAVENOUS
  Administered 2014-04-30 (×2): 4 mg via INTRAVENOUS
  Administered 2014-05-01: 15 mg via INTRAVENOUS
  Filled 2014-04-28 (×4): qty 25

## 2014-04-28 MED ORDER — HYDROMORPHONE HCL 2 MG/ML IJ SOLN
INTRAMUSCULAR | Status: AC
  Filled 2014-04-28: qty 1

## 2014-04-28 MED ORDER — ENOXAPARIN SODIUM 40 MG/0.4ML ~~LOC~~ SOLN
40.0000 mg | SUBCUTANEOUS | Status: DC
  Administered 2014-04-29 – 2014-05-01 (×3): 40 mg via SUBCUTANEOUS
  Filled 2014-04-28 (×6): qty 0.4

## 2014-04-28 MED ORDER — CISATRACURIUM BESYLATE (PF) 10 MG/5ML IV SOLN
INTRAVENOUS | Status: DC | PRN
  Administered 2014-04-28: 2 mg via INTRAVENOUS
  Administered 2014-04-28: 6 mg via INTRAVENOUS
  Administered 2014-04-28: 4 mg via INTRAVENOUS
  Administered 2014-04-28: 2 mg via INTRAVENOUS

## 2014-04-28 MED ORDER — CIPROFLOXACIN IN D5W 400 MG/200ML IV SOLN
400.0000 mg | INTRAVENOUS | Status: AC
  Administered 2014-04-28: 400 mg via INTRAVENOUS

## 2014-04-28 MED ORDER — ALBUTEROL SULFATE (2.5 MG/3ML) 0.083% IN NEBU: 3.0000 mL | INHALATION_SOLUTION | RESPIRATORY_TRACT | Status: DC | PRN

## 2014-04-28 MED ORDER — DIPHENHYDRAMINE HCL 12.5 MG/5ML PO ELIX: 12.5000 mg | ORAL_SOLUTION | Freq: Four times a day (QID) | ORAL | Status: DC | PRN

## 2014-04-28 MED ORDER — GLYCOPYRROLATE 0.2 MG/ML IJ SOLN
INTRAMUSCULAR | Status: AC
  Filled 2014-04-28: qty 3

## 2014-04-28 MED ORDER — 0.9 % SODIUM CHLORIDE (POUR BTL) OPTIME
TOPICAL | Status: DC | PRN
  Administered 2014-04-28: 2000 mL

## 2014-04-28 MED ORDER — DEXAMETHASONE SODIUM PHOSPHATE 4 MG/ML IJ SOLN
INTRAMUSCULAR | Status: DC | PRN
  Administered 2014-04-28: 10 mg via INTRAVENOUS

## 2014-04-28 MED ORDER — SODIUM CHLORIDE 0.9 % IJ SOLN: 9.0000 mL | INTRAMUSCULAR | Status: DC | PRN

## 2014-04-28 MED ORDER — LOSARTAN POTASSIUM 50 MG PO TABS
50.0000 mg | ORAL_TABLET | Freq: Every day | ORAL | Status: DC
  Administered 2014-04-29 – 2014-05-01 (×3): 50 mg via ORAL
  Filled 2014-04-28 (×4): qty 1

## 2014-04-28 MED ORDER — BUPIVACAINE-EPINEPHRINE (PF) 0.25% -1:200000 IJ SOLN
INTRAMUSCULAR | Status: AC
  Filled 2014-04-28: qty 30

## 2014-04-28 MED ORDER — CEFOTETAN DISODIUM 2 G IJ SOLR
2.0000 g | Freq: Two times a day (BID) | INTRAMUSCULAR | Status: AC
  Administered 2014-04-28: 2 g via INTRAVENOUS
  Filled 2014-04-28: qty 2

## 2014-04-28 MED ORDER — FENTANYL CITRATE 0.05 MG/ML IJ SOLN
INTRAMUSCULAR | Status: DC | PRN
  Administered 2014-04-28 (×9): 50 ug via INTRAVENOUS

## 2014-04-28 MED ORDER — METOPROLOL TARTRATE 1 MG/ML IV SOLN
INTRAVENOUS | Status: DC | PRN
  Administered 2014-04-28 (×2): 1 mg via INTRAVENOUS
  Administered 2014-04-28: 2 mg via INTRAVENOUS

## 2014-04-28 MED ORDER — LACTATED RINGERS IR SOLN
Status: DC | PRN
  Administered 2014-04-28: 1000 mL

## 2014-04-28 MED ORDER — METRONIDAZOLE IN NACL 5-0.79 MG/ML-% IV SOLN
500.0000 mg | INTRAVENOUS | Status: AC
  Administered 2014-04-28: 500 mg via INTRAVENOUS
  Filled 2014-04-28: qty 100

## 2014-04-28 MED ORDER — METOPROLOL TARTRATE 1 MG/ML IV SOLN
INTRAVENOUS | Status: AC
  Filled 2014-04-28: qty 5

## 2014-04-28 MED ORDER — LOSARTAN POTASSIUM-HCTZ 50-12.5 MG PO TABS: 1.0000 | ORAL_TABLET | Freq: Every morning | ORAL | Status: DC

## 2014-04-28 MED ORDER — PROPOFOL 10 MG/ML IV BOLUS
INTRAVENOUS | Status: DC | PRN
  Administered 2014-04-28: 150 mg via INTRAVENOUS
  Administered 2014-04-28: 50 mg via INTRAVENOUS

## 2014-04-28 MED ORDER — HYDROMORPHONE HCL 1 MG/ML IJ SOLN
0.2500 mg | INTRAMUSCULAR | Status: DC | PRN
  Administered 2014-04-28 (×2): 0.5 mg via INTRAVENOUS

## 2014-04-28 MED ORDER — FENTANYL CITRATE 0.05 MG/ML IJ SOLN
INTRAMUSCULAR | Status: AC
  Filled 2014-04-28: qty 2

## 2014-04-28 MED ORDER — MOMETASONE FURO-FORMOTEROL FUM 100-5 MCG/ACT IN AERO
2.0000 | INHALATION_SPRAY | Freq: Two times a day (BID) | RESPIRATORY_TRACT | Status: DC
  Filled 2014-04-28: qty 8.8

## 2014-04-28 MED ORDER — ALVIMOPAN 12 MG PO CAPS
12.0000 mg | ORAL_CAPSULE | Freq: Two times a day (BID) | ORAL | Status: DC
  Administered 2014-04-29 – 2014-04-30 (×4): 12 mg via ORAL
  Filled 2014-04-28 (×6): qty 1

## 2014-04-28 MED ORDER — LIDOCAINE HCL 1 % IJ SOLN
INTRAMUSCULAR | Status: DC | PRN
  Administered 2014-04-28: 20 mL

## 2014-04-28 MED ORDER — ACETAMINOPHEN 325 MG PO TABS: 650.0000 mg | ORAL_TABLET | Freq: Four times a day (QID) | ORAL | Status: DC | PRN

## 2014-04-28 MED ORDER — KCL IN DEXTROSE-NACL 20-5-0.45 MEQ/L-%-% IV SOLN
INTRAVENOUS | Status: DC
  Administered 2014-04-28 – 2014-04-30 (×4): via INTRAVENOUS
  Filled 2014-04-28 (×7): qty 1000

## 2014-04-28 MED ORDER — FENTANYL CITRATE 0.05 MG/ML IJ SOLN
INTRAMUSCULAR | Status: AC
  Filled 2014-04-28: qty 5

## 2014-04-28 MED ORDER — LORATADINE 10 MG PO TABS
10.0000 mg | ORAL_TABLET | Freq: Every day | ORAL | Status: DC | PRN
  Filled 2014-04-28: qty 1

## 2014-04-28 MED ORDER — ALBUTEROL SULFATE HFA 108 (90 BASE) MCG/ACT IN AERS
INHALATION_SPRAY | RESPIRATORY_TRACT | Status: DC | PRN
  Administered 2014-04-28 (×2): 5 via RESPIRATORY_TRACT

## 2014-04-28 MED ORDER — METRONIDAZOLE IN NACL 5-0.79 MG/ML-% IV SOLN
INTRAVENOUS | Status: AC
  Filled 2014-04-28: qty 100

## 2014-04-28 MED ORDER — LIDOCAINE HCL 1 % IJ SOLN
INTRAMUSCULAR | Status: AC
  Filled 2014-04-28: qty 20

## 2014-04-28 MED ORDER — NALOXONE HCL 0.4 MG/ML IJ SOLN: 0.4000 mg | INTRAMUSCULAR | Status: DC | PRN

## 2014-04-28 MED ORDER — ONDANSETRON HCL 4 MG/2ML IJ SOLN
INTRAMUSCULAR | Status: AC
  Filled 2014-04-28: qty 2

## 2014-04-28 MED ORDER — ZOLPIDEM TARTRATE 5 MG PO TABS: 5.0000 mg | ORAL_TABLET | Freq: Every evening | ORAL | Status: DC | PRN

## 2014-04-28 MED ORDER — GLYCOPYRROLATE 0.2 MG/ML IJ SOLN
INTRAMUSCULAR | Status: DC | PRN
Start: 2014-04-28 — End: 2014-04-28
  Administered 2014-04-28: 0.6 mg via INTRAVENOUS

## 2014-04-28 MED ORDER — LACTATED RINGERS IV SOLN
INTRAVENOUS | Status: DC | PRN
Start: 2014-04-28 — End: 2014-04-28
  Administered 2014-04-28 (×3): via INTRAVENOUS

## 2014-04-28 MED ORDER — ONDANSETRON HCL 4 MG/2ML IJ SOLN: 4.0000 mg | Freq: Four times a day (QID) | INTRAMUSCULAR | Status: DC | PRN

## 2014-04-28 MED ORDER — ALUM & MAG HYDROXIDE-SIMETH 200-200-20 MG/5ML PO SUSP: 30.0000 mL | Freq: Four times a day (QID) | ORAL | Status: DC | PRN

## 2014-04-28 MED ORDER — ONDANSETRON HCL 4 MG/2ML IJ SOLN
INTRAMUSCULAR | Status: DC | PRN
  Administered 2014-04-28: 4 mg via INTRAVENOUS

## 2014-04-28 MED ORDER — ONDANSETRON HCL 4 MG PO TABS: 4.0000 mg | ORAL_TABLET | Freq: Four times a day (QID) | ORAL | Status: DC | PRN

## 2014-04-28 MED ORDER — PROMETHAZINE HCL 25 MG/ML IJ SOLN: 6.2500 mg | INTRAMUSCULAR | Status: DC | PRN

## 2014-04-28 MED ORDER — IPRATROPIUM-ALBUTEROL 0.5-2.5 (3) MG/3ML IN SOLN: 3.0000 mL | Freq: Four times a day (QID) | RESPIRATORY_TRACT | Status: DC | PRN

## 2014-04-28 MED ORDER — GABAPENTIN 100 MG PO CAPS
100.0000 mg | ORAL_CAPSULE | Freq: Three times a day (TID) | ORAL | Status: DC | PRN
  Filled 2014-04-28: qty 1

## 2014-04-28 MED ORDER — MORPHINE SULFATE (PF) 1 MG/ML IV SOLN
INTRAVENOUS | Status: AC
  Filled 2014-04-28: qty 25

## 2014-04-28 MED ORDER — MIDAZOLAM HCL 2 MG/2ML IJ SOLN
INTRAMUSCULAR | Status: AC
  Filled 2014-04-28: qty 2

## 2014-04-28 MED ORDER — DIPHENHYDRAMINE HCL 50 MG/ML IJ SOLN
12.5000 mg | Freq: Four times a day (QID) | INTRAMUSCULAR | Status: DC | PRN
Start: 2014-04-28 — End: 2014-05-02

## 2014-04-28 MED ORDER — ANASTROZOLE 1 MG PO TABS
1.0000 mg | ORAL_TABLET | Freq: Every day | ORAL | Status: DC
  Administered 2014-04-29 – 2014-05-02 (×4): 1 mg via ORAL
  Filled 2014-04-28 (×6): qty 1

## 2014-04-28 MED ORDER — ACETAMINOPHEN 10 MG/ML IV SOLN
1000.0000 mg | Freq: Once | INTRAVENOUS | Status: AC
  Administered 2014-04-28: 1000 mg via INTRAVENOUS
  Filled 2014-04-28: qty 100

## 2014-04-28 MED ORDER — ESMOLOL HCL 10 MG/ML IV SOLN
INTRAVENOUS | Status: AC
  Filled 2014-04-28: qty 10

## 2014-04-28 MED ORDER — MONTELUKAST SODIUM 10 MG PO TABS
10.0000 mg | ORAL_TABLET | Freq: Every day | ORAL | Status: DC
  Administered 2014-04-29 – 2014-05-02 (×4): 10 mg via ORAL
  Filled 2014-04-28 (×4): qty 1

## 2014-04-28 MED ORDER — DEXAMETHASONE SODIUM PHOSPHATE 10 MG/ML IJ SOLN
INTRAMUSCULAR | Status: AC
  Filled 2014-04-28: qty 1

## 2014-04-28 MED ORDER — HYDROCHLOROTHIAZIDE 12.5 MG PO CAPS
12.5000 mg | ORAL_CAPSULE | Freq: Every day | ORAL | Status: DC
  Administered 2014-04-29 – 2014-05-01 (×3): 12.5 mg via ORAL
  Filled 2014-04-28 (×4): qty 1

## 2014-04-28 MED ORDER — CISATRACURIUM BESYLATE 20 MG/10ML IV SOLN
INTRAVENOUS | Status: AC
  Filled 2014-04-28: qty 10

## 2014-04-28 MED ORDER — NEOSTIGMINE METHYLSULFATE 10 MG/10ML IV SOLN
INTRAVENOUS | Status: DC | PRN
  Administered 2014-04-28: 4 mg via INTRAVENOUS

## 2014-04-28 MED ORDER — ALVIMOPAN 12 MG PO CAPS
12.0000 mg | ORAL_CAPSULE | Freq: Once | ORAL | Status: AC
  Administered 2014-04-28: 12 mg via ORAL
  Filled 2014-04-28: qty 1

## 2014-04-28 MED ORDER — ACETAMINOPHEN 325 MG PO TABS: 650.0000 mg | ORAL_TABLET | Freq: Every evening | ORAL | Status: DC | PRN

## 2014-04-28 MED ORDER — MIDAZOLAM HCL 5 MG/5ML IJ SOLN
INTRAMUSCULAR | Status: DC | PRN
  Administered 2014-04-28 (×2): 0.5 mg via INTRAVENOUS
  Administered 2014-04-28: 1 mg via INTRAVENOUS

## 2014-04-28 MED ORDER — ALBUTEROL SULFATE HFA 108 (90 BASE) MCG/ACT IN AERS
INHALATION_SPRAY | RESPIRATORY_TRACT | Status: AC
  Filled 2014-04-28: qty 6.7

## 2014-04-28 MED ORDER — LACTATED RINGERS IV SOLN: INTRAVENOUS | Status: DC

## 2014-04-28 MED ORDER — HYDROMORPHONE HCL 1 MG/ML IJ SOLN
INTRAMUSCULAR | Status: AC
Start: 2014-04-28 — End: 2014-04-28
  Filled 2014-04-28: qty 1

## 2014-04-28 MED ORDER — KETAMINE HCL 10 MG/ML IJ SOLN
INTRAMUSCULAR | Status: AC
  Filled 2014-04-28: qty 1

## 2014-04-28 MED ORDER — PROPOFOL 10 MG/ML IV BOLUS
INTRAVENOUS | Status: AC
  Filled 2014-04-28: qty 20

## 2014-04-28 SURGICAL SUPPLY — 87 items
APL SKNCLS STERI-STRIP NONHPOA (GAUZE/BANDAGES/DRESSINGS) ×1
APPLIER CLIP 5 13 M/L LIGAMAX5 (MISCELLANEOUS)
APPLIER CLIP ROT 10 11.4 M/L (STAPLE)
APR CLP MED LRG 11.4X10 (STAPLE)
APR CLP MED LRG 5 ANG JAW (MISCELLANEOUS)
BENZOIN TINCTURE PRP APPL 2/3 (GAUZE/BANDAGES/DRESSINGS) ×2 IMPLANT
BLADE EXTENDED COATED 6.5IN (ELECTRODE) ×1 IMPLANT
BLADE HEX COATED 2.75 (ELECTRODE) ×2 IMPLANT
CABLE HIGH FREQUENCY MONO STRZ (ELECTRODE) ×3 IMPLANT
CATH KIT ON-Q SILVERSOAK 7.5 (CATHETERS) IMPLANT
CATH KIT ON-Q SILVERSOAK 7.5IN (CATHETERS) IMPLANT
CELLS DAT CNTRL 66122 CELL SVR (MISCELLANEOUS) IMPLANT
CLIP APPLIE 5 13 M/L LIGAMAX5 (MISCELLANEOUS) IMPLANT
CLIP APPLIE ROT 10 11.4 M/L (STAPLE) IMPLANT
CLOSURE WOUND 1/2 X4 (GAUZE/BANDAGES/DRESSINGS) ×1
COUNTER NEEDLE 20 DBL MAG RED (NEEDLE) ×1 IMPLANT
DECANTER SPIKE VIAL GLASS SM (MISCELLANEOUS) ×3 IMPLANT
DRAIN CHANNEL 19F RND (DRAIN) IMPLANT
DRAPE LAPAROSCOPIC ABDOMINAL (DRAPES) ×3 IMPLANT
DRAPE UTILITY XL STRL (DRAPES) ×6 IMPLANT
DRSG OPSITE POSTOP 4X10 (GAUZE/BANDAGES/DRESSINGS) IMPLANT
DRSG OPSITE POSTOP 4X6 (GAUZE/BANDAGES/DRESSINGS) ×2 IMPLANT
DRSG OPSITE POSTOP 4X8 (GAUZE/BANDAGES/DRESSINGS) IMPLANT
DRSG TEGADERM 4X4.75 (GAUZE/BANDAGES/DRESSINGS) IMPLANT
ELECT REM PT RETURN 9FT ADLT (ELECTROSURGICAL) ×3
ELECTRODE REM PT RTRN 9FT ADLT (ELECTROSURGICAL) ×1 IMPLANT
ENDOLOOP SUT PDS II  0 18 (SUTURE)
ENDOLOOP SUT PDS II 0 18 (SUTURE) IMPLANT
GAUZE SPONGE 2X2 8PLY STRL LF (GAUZE/BANDAGES/DRESSINGS) IMPLANT
GAUZE SPONGE 4X4 12PLY STRL (GAUZE/BANDAGES/DRESSINGS) ×1 IMPLANT
GLOVE BIO SURGEON STRL SZ 6 (GLOVE) ×6 IMPLANT
GLOVE INDICATOR 6.5 STRL GRN (GLOVE) ×6 IMPLANT
GOWN SPEC L4 XLG W/TWL (GOWN DISPOSABLE) ×23 IMPLANT
GOWN STRL REUS W/ TWL XL LVL3 (GOWN DISPOSABLE) ×3 IMPLANT
GOWN STRL REUS W/TWL XL LVL3 (GOWN DISPOSABLE) ×9
LEGGING LITHOTOMY PAIR STRL (DRAPES) ×1 IMPLANT
LUBRICANT JELLY K Y 4OZ (MISCELLANEOUS) IMPLANT
PACK COLON (CUSTOM PROCEDURE TRAY) ×3 IMPLANT
PORT LAP GEL ALEXIS MED 5-9CM (MISCELLANEOUS) ×2 IMPLANT
RELOAD PROXIMATE 75MM BLUE (ENDOMECHANICALS) ×6 IMPLANT
RELOAD STAPLE 75 3.8 BLU REG (ENDOMECHANICALS) IMPLANT
RETRACTOR WND ALEXIS 18 MED (MISCELLANEOUS) IMPLANT
RTRCTR WOUND ALEXIS 18CM MED (MISCELLANEOUS)
SCISSORS LAP 5X35 DISP (ENDOMECHANICALS) ×3 IMPLANT
SEALER TISSUE G2 CVD JAW 35 (ENDOMECHANICALS) IMPLANT
SEALER TISSUE G2 CVD JAW 45CM (ENDOMECHANICALS)
SEALER TISSUE G2 STRG ARTC 35C (ENDOMECHANICALS) ×2 IMPLANT
SET IRRIG TUBING LAPAROSCOPIC (IRRIGATION / IRRIGATOR) ×3 IMPLANT
SHEARS HARMONIC ACE PLUS 36CM (ENDOMECHANICALS) IMPLANT
SLEEVE SURGEON STRL (DRAPES) IMPLANT
SLEEVE XCEL OPT CAN 5 100 (ENDOMECHANICALS) ×6 IMPLANT
SOLUTION ANTI FOG 6CC (MISCELLANEOUS) ×1 IMPLANT
SPONGE GAUZE 2X2 STER 10/PKG (GAUZE/BANDAGES/DRESSINGS) ×2
STAPLER GUN LINEAR PROX 60 (STAPLE) ×2 IMPLANT
STAPLER PROXIMATE 75MM BLUE (STAPLE) ×2 IMPLANT
STAPLER VISISTAT 35W (STAPLE) ×3 IMPLANT
STRIP CLOSURE SKIN 1/2X4 (GAUZE/BANDAGES/DRESSINGS) ×1 IMPLANT
SUT MNCRL AB 4-0 PS2 18 (SUTURE) ×5 IMPLANT
SUT PDS AB 1 CTX 36 (SUTURE) ×4 IMPLANT
SUT PDS AB 1 TP1 96 (SUTURE) IMPLANT
SUT PROLENE 2 0 KS (SUTURE) IMPLANT
SUT SILK 2 0 (SUTURE) ×3
SUT SILK 2 0 SH CR/8 (SUTURE) ×2 IMPLANT
SUT SILK 2-0 18XBRD TIE 12 (SUTURE) IMPLANT
SUT SILK 3 0 (SUTURE) ×3
SUT SILK 3 0 SH CR/8 (SUTURE) ×2 IMPLANT
SUT SILK 3-0 18XBRD TIE 12 (SUTURE) IMPLANT
SUT VIC AB 2-0 SH 18 (SUTURE) ×1 IMPLANT
SUT VIC AB 2-0 SH 27 (SUTURE) ×3
SUT VIC AB 2-0 SH 27X BRD (SUTURE) IMPLANT
SUT VIC AB 3-0 SH 18 (SUTURE) ×3 IMPLANT
SUT VICRYL 0 UR6 27IN ABS (SUTURE) ×2 IMPLANT
SUT VICRYL 2 0 18  UND BR (SUTURE)
SUT VICRYL 2 0 18 UND BR (SUTURE) ×1 IMPLANT
SUT VICRYL 3 0 BR 18  UND (SUTURE)
SUT VICRYL 3 0 BR 18 UND (SUTURE) ×1 IMPLANT
SYS LAPSCP GELPORT 120MM (MISCELLANEOUS)
SYSTEM LAPSCP GELPORT 120MM (MISCELLANEOUS) IMPLANT
TOWEL OR NON WOVEN STRL DISP B (DISPOSABLE) ×4 IMPLANT
TRAY FOLEY CATH 14FRSI W/METER (CATHETERS) ×3 IMPLANT
TROCAR BLADELESS OPT 5 100 (ENDOMECHANICALS) ×3 IMPLANT
TROCAR XCEL BLUNT TIP 100MML (ENDOMECHANICALS) ×2 IMPLANT
TROCAR XCEL NON-BLD 11X100MML (ENDOMECHANICALS) IMPLANT
TUBING CONNECTING 10 (TUBING) IMPLANT
TUBING CONNECTING 10' (TUBING)
TUBING FILTER THERMOFLATOR (ELECTROSURGICAL) ×3 IMPLANT
TUNNELER SHEATH ON-Q 16GX12 DP (PAIN MANAGEMENT) IMPLANT

## 2014-04-28 NOTE — Anesthesia Postprocedure Evaluation (Signed)
  Anesthesia Post-op Note  Patient: Ann Maxwell  Procedure(s) Performed: Procedure(s) (LRB): LAPAROSCOPIC  RIGHT HEMI-COLECTOMY (N/A)  Patient Location: PACU  Anesthesia Type: General  Level of Consciousness: awake and alert   Airway and Oxygen Therapy: Patient Spontanous Breathing  Post-op Pain: mild  Post-op Assessment: Post-op Vital signs reviewed, Patient's Cardiovascular Status Stable, Respiratory Function Stable, Patent Airway and No signs of Nausea or vomiting  Last Vitals:  Filed Vitals:   04/28/14 1430  BP: 110/82  Pulse: 58  Temp: 36.4 C  Resp: 14    Post-op Vital Signs: stable   Complications: No apparent anesthesia complications

## 2014-04-28 NOTE — Anesthesia Procedure Notes (Signed)
Procedure Name: Intubation Date/Time: 04/28/2014 8:01 AM Performed by: Stark Klein Pre-anesthesia Checklist: Patient identified, Emergency Drugs available and Suction available Patient Re-evaluated:Patient Re-evaluated prior to inductionOxygen Delivery Method: Circle system utilized Preoxygenation: Pre-oxygenation with 100% oxygen Intubation Type: IV induction Ventilation: Mask ventilation without difficulty Laryngoscope Size: Mac and 3 Grade View: Grade I Tube type: Oral Tube size: 7.0 mm Number of attempts: 1 Airway Equipment and Method: Stylet Placement Confirmation: ETT inserted through vocal cords under direct vision,  positive ETCO2 and breath sounds checked- equal and bilateral Secured at: 21 cm Tube secured with: Tape Dental Injury: Teeth and Oropharynx as per pre-operative assessment

## 2014-04-28 NOTE — Transfer of Care (Signed)
Immediate Anesthesia Transfer of Care Note  Patient: Ann Maxwell  Procedure(s) Performed: Procedure(s): LAPAROSCOPIC  RIGHT HEMI-COLECTOMY (N/A)  Patient Location: PACU  Anesthesia Type:General  Level of Consciousness: Patient easily awoken, sedated, comfortable, cooperative, following commands, responds to stimulation.   Airway & Oxygen Therapy: Patient spontaneously breathing, ventilating well, oxygen via simple oxygen mask.  Post-op Assessment: Report given to PACU RN, vital signs reviewed and stable, moving all extremities.   Post vital signs: Reviewed and stable.  Complications: No apparent anesthesia complications

## 2014-04-28 NOTE — Op Note (Signed)
Laparoscopic Partial Colectomy (R hemicolectomy) Procedure Note   Indications: This patient presents for a laparoscopic right colectomy for cecal cancer  Pre-operative Diagnosis: cecal cancer   Post-operative Diagnosis: Same  Surgeon: HMCNOB,SJGGE   Assistants: Coralie Keens MD and Chester Holstein PA-S  Anesthesia: General endotracheal anesthesia   ASA Class: 2  Procedure Details  The patient was seen in the Holding Room. The risks, benefits, complications, treatment options, and expected outcomes were discussed with the patient. The possibilities of reaction to medication, perforation of viscus, bleeding, recurrent infection, finding a normal colon, the need for additional procedures, failure to diagnose a condition, and creating a complication requiring transfusion or operation were discussed with the patient. The patient was advised of the risk of ostomy. The patient concurred with the proposed plan, giving informed consent. The patient was taken to the operating room, identified, and the procedure verified as partial colectomy. A Time Out was held and the above information confirmed.   The patient was brought to the operating room and placed supine. After induction of a general anesthetic, a Foley catheter was inserted and the abdomen was prepped and draped in standard fashion. Local anesthetic was infiltrated at the left costal margin. A supraumbilical incision was made approximately 10 mm in length.  The subcutaneous tissues were spread with a kelly clamp.  The fascia was elevated with two Kocher clamps.  The fascia was incised.  The kelly was used to confirm entrance into the peritoneal cavity.  A pursestring suture was placed around the incision.  The Hasson trocar was placed into the abdomen under direct visualization and secured with the tails of the suture.  Pneumoperitoneum was insufflated to a pressure of 15 mm Hg. The laparoscope was introduced.   Exploration revealed  a normal omentum, colon, small bowel, peritoneum, liver, and stomach. Two additional left upper quadrant 5-mm trocars were then placed after anesthetizing the skin and peritoneum with Marcaine. The ascending colon and hepatic flexure were then mobilized with gentle retraction of the colon in a medial direction with mobilization of the peritoneal reflection with cautery and the Enseal. Mobilization of this area was complete to expose the retroperitoneum. There was no blood loss during this portion of the procedure. The duodenum was avoided. The omentum was taken off the middle and proximal transverse colon with the Enseal.   After completing mobilization, a 6 cm incision was made in the midline just above the umbilicus incorporating the Hasson incision.  A wound protector was placed to protect the skin, and the colon was delivered through the incision. The terminal ileum and colon was resected with a linear stapling device proximal and distal to the area in question in regard to the specimen. The mesenteric vessels were clamped, cut, and tied with 2-0 vicryl ties. The specimen was submitted to pathology.   An side to side, functional end-to-end anastomosis was performed through the small anterior incision with the linear stapling device. The mucosa was inspected and found to be hemostatic. Closure was achieved with the transverse stapling device. A 3-0 Vicryl suture was used to reapproximate the angle of the anastomosis. Hemostasis was confirmed. The mesenteric defect was closed with 2-0 running Vicryl suture. The bowel anastomosis was returned. The OnQ tunnelers were advanced through the skin on each side of the incision. The 5 inch catheters were advanced through the skin.   The fascial incision was then closed with a running #1 PDS suture. The soft tissue was irrigated and the incisions were closed  with 4-0 Vicryl subcuticular closure. Dermabond was applied.   Instrument, sponge, and needle counts were  correct prior to abdominal closure and at the conclusion of the case.   Findings: large cecal mass  Estimated Blood Loss: Minimal   Specimens: R colon   Complications: None; patient tolerated the procedure well.   Disposition: PACU - hemodynamically stable.   Condition: stable

## 2014-04-28 NOTE — Interval H&P Note (Signed)
History and Physical Interval Note:  04/28/2014 7:28 AM  Ann Maxwell  has presented today for surgery, with the diagnosis of colon cancer  The various methods of treatment have been discussed with the patient and family. After consideration of risks, benefits and other options for treatment, the patient has consented to  Procedure(s): LAPAROSCOPIC ILEOCOLECTOMY (N/A) as a surgical intervention .  The patient's history has been reviewed, patient examined, no change in status, stable for surgery.  I have reviewed the patient's chart and labs.  Questions were answered to the patient's satisfaction.     Trixie Maclaren

## 2014-04-28 NOTE — H&P (Signed)
Ernst Spell Location: [facility] Surgery Patient #: [id number] DOB: 1940/06/27 Divorced / Language: Cleophus Molt / Race: White Female  History of Present Illness  Patient words: eval cecl mass.  The patient is a 74 year old female who presents with colorectal cancer. Pt is a 74 yo F who I did a mastectomy on in 2013 for DCIS. She has been on arimidex since that time. She had a colonoscopy around the first part of 2014 that was clear, but started having anemia just after that. She had hemoccult negative stools. She has gotten multiple IV iron infusions since then. Recently, she had a heme positive stool, and got an EGD and a capsule endoscopy. The capsule endoscopy showed a mass at the ileocecal valve. A Ct was performed which demonstrated a 6.3 cm mass in the cecum with some prominent local nodes. A repeat colonoscopy was performed which demonstrated a large mass at the cecum which was biopsied. The patient is referred for colectomy. Other than anemia, she has no abdominal pain, no obstructive symptoms, no n/v. She is not having frankly bloody stools. As part of her former evaluation, she had BRCA 1 and 2 testing, which was negative. She has not had lynch testing.    Medication History  Advair Diskus (250-50MCG/DOSE Aero Pow Br Act, Inhalation daily) Active. Anastrozole (1MG Tablet, Oral daily) Active. Celecoxib (200MG Capsule, Oral daily) Active. Gabapentin (300MG Capsule, Oral daily) Active. Hydrochlorothiazide (12.5MG Capsule, Oral daily) Active. Losartan Potassium-HCTZ (50-12.5MG Tablet, Oral daily) Active. Montelukast Sodium (10MG Tablet, Oral daily) Active. Methocarbamol (500MG Tablet, Oral daily) Active.  Review of Systems All other systems negative   Vitals  Wt Readings from Last 3 Encounters:  04/28/14 218 lb 2 oz (98.941 kg)  04/23/14 218 lb 2 oz (98.941 kg)  03/31/14 217 lb 8 oz (98.657 kg)   Temp Readings from Last 3 Encounters:  04/28/14 97.8 F  (36.6 C) Oral  04/23/14 98.8 F (37.1 C) Oral  04/07/14 99.5 F (37.5 C) Oral   BP Readings from Last 3 Encounters:  04/28/14 142/76  04/23/14 134/67  04/07/14 120/47   Pulse Readings from Last 3 Encounters:  04/28/14 100  04/23/14 98  04/07/14 79      Physical Exam  General Mental Status-Alert. General Appearance-Consistent with stated age. Hydration-Well hydrated. Voice-Normal.  Head and Neck Head-normocephalic, atraumatic with no lesions or palpable masses.  Eye Sclera/Conjunctiva - Bilateral-No scleral icterus.  Chest and Lung Exam Chest and lung exam reveals -quiet, even and easy respiratory effort with no use of accessory muscles. Inspection Chest Wall - Normal. Back - normal.  Breast - Did not examine.  Cardiovascular Cardiovascular examination reveals -normal pedal pulses bilaterally. Note: regular rate and rhythm  Abdomen Inspection-Inspection Normal. Palpation/Percussion Palpation and Percussion of the abdomen reveal - Soft, Non Tender, No Rebound tenderness, No Rigidity (guarding) and No hepatosplenomegaly. Auscultation Auscultation of the abdomen reveals - Bowel sounds normal.  Peripheral Vascular Upper Extremity Inspection - Bilateral - Normal - No Clubbing, No Cyanosis, No Edema, Pulses Intact. Lower Extremity Palpation - Edema - Bilateral - No edema.  Neurologic Neurologic evaluation reveals -alert and oriented x 3 with no impairment of recent or remote memory. Mental Status-Normal.  Musculoskeletal Global Assessment -Note: no gross deformities.  Normal Exam - Left-Upper Extremity Strength Normal and Lower Extremity Strength Normal. Normal Exam - Right-Upper Extremity Strength Normal and Lower Extremity Strength Normal.  Lymphatic Head & Neck  General Head & Neck Lymphatics: Bilateral - Description - Normal. Axillary  General  Axillary Region: Bilateral - Description - Normal. Tenderness - Non  Tender.    Assessment & Plan  PRIMARY ADENOCARCINOMA OF COLON (153.9  C18.9) Impression: Although the pathology is not back yet, I would not believe a dysplasia or polyp diagnosis based on the CT appearance. I will order a chest CT to make sure she does not have metastatic disease. She is getting labs at the cancer center tomorrow for breast cancer follow up. I have requested from Dr. Lindi Adie that we add on a CEA, CMET, and INR.  I would plan to do this laparoscopically or robotically.  i reviewed the risks of surgery including bleeding, infection, damage to adjacent structures, hernia, heart or lung problems, blood transfusion, possible ostomy, possible need for additional surgery, and possible prolonged hospital stay.  I think she needs to go back to see genetics as well with the possible dx of lynch syndrome. Current Plans  Schedule for Surgery Pt Education - [facility] Bowel Prep Pt Education - [facility] Laparoscopic Surgery HCI Referred to Genetic Counseling, for evaluation and follow up (Medical Genetics).

## 2014-04-29 ENCOUNTER — Encounter (HOSPITAL_COMMUNITY): Payer: Self-pay | Admitting: General Surgery

## 2014-04-29 LAB — BASIC METABOLIC PANEL
Anion gap: 5 (ref 5–15)
BUN: 12 mg/dL (ref 6–23)
CO2: 27 mmol/L (ref 19–32)
CREATININE: 0.6 mg/dL (ref 0.50–1.10)
Calcium: 8.6 mg/dL (ref 8.4–10.5)
Chloride: 105 mEq/L (ref 96–112)
GFR calc Af Amer: >90 mL/min (ref >90)
GFR, EST NON AFRICAN AMERICAN: 90 mL/min — AB (ref >90)
GLUCOSE: 137 mg/dL — AB (ref 70–99)
POTASSIUM: 4 mmol/L (ref 3.5–5.1)
Sodium: 137 mmol/L (ref 135–145)

## 2014-04-29 LAB — CBC
HEMATOCRIT: 31.6 % — AB (ref 36.0–46.0)
Hemoglobin: 9.6 g/dL — ABNORMAL LOW (ref 12.0–15.0)
MCH: 26.3 pg (ref 26.0–34.0)
MCHC: 30.4 g/dL (ref 30.0–36.0)
MCV: 86.6 fL (ref 78.0–100.0)
Platelets: 261 10*3/uL (ref 150–400)
RBC: 3.65 MIL/uL — AB (ref 3.87–5.11)
RDW: 19.3 % — ABNORMAL HIGH (ref 11.5–15.5)
WBC: 11.5 10*3/uL — AB (ref 4.0–10.5)

## 2014-04-29 LAB — PHOSPHORUS: Phosphorus: 3.3 mg/dL (ref 2.3–4.6)

## 2014-04-29 LAB — MAGNESIUM: MAGNESIUM: 1.8 mg/dL (ref 1.5–2.5)

## 2014-04-29 NOTE — Evaluation (Signed)
Physical Therapy Evaluation Patient Details Name: Ann Maxwell MRN: [id number] DOB: 1940/10/06 Today's Date: 04/29/2014   History of Present Illness  The patient is a 74 year old female who presents with colorectal cancer. S/p hemicolectomy 04/28/14.  Clinical Impression  Patient is very motivated to mobilize. Anticipate no DME needs. Patient will benefit from PT to address functional problems listed.    Follow Up Recommendations No PT follow up    Equipment Recommendations  None recommended by PT    Recommendations for Other Services       Precautions / Restrictions        Mobility  Bed Mobility                  Transfers Overall transfer level: Needs assistance Equipment used: Rolling walker (2 wheeled) Transfers: Sit to/from Stand Sit to Stand: Supervision            Ambulation/Gait Ambulation/Gait assistance: Min guard Ambulation Distance (Feet): 200 Feet Assistive device: Rolling walker (2 wheeled) Gait Pattern/deviations: Step-through pattern;Trunk flexed     General Gait Details: cues for posture  Stairs            Wheelchair Mobility    Modified Rankin (Stroke Patients Only)       Balance                                             Pertinent Vitals/Pain Pain Assessment: 0-10 Pain Score: 4  Pain Descriptors / Indicators: Discomfort Pain Intervention(s): Monitored during session;PCA encouraged  sats > 92% on RA during mobility HR 93-112    Home Living Family/patient expects to be discharged to:: Private residence Living Arrangements: Alone Available Help at Discharge: Family Type of Home: House Home Access: Stairs to enter   Technical brewer of Steps: 2 Home Layout: One level;Able to live on main level with bedroom/bathroom Home Equipment: None Additional Comments: will stay with daughter    Prior Function Level of Independence: Independent               Hand Dominance         Extremity/Trunk Assessment   Upper Extremity Assessment: Overall WFL for tasks assessed           Lower Extremity Assessment: Overall WFL for tasks assessed      Cervical / Trunk Assessment: Normal  Communication   Communication: No difficulties  Cognition Arousal/Alertness: Awake/alert Behavior During Therapy: WFL for tasks assessed/performed Overall Cognitive Status: Within Functional Limits for tasks assessed                      General Comments      Exercises        Assessment/Plan    PT Assessment Patient needs continued PT services  PT Diagnosis Difficulty walking;Acute pain   PT Problem List Decreased activity tolerance;Decreased mobility;Pain  PT Treatment Interventions Gait training;Functional mobility training;Therapeutic exercise;Therapeutic activities   PT Goals (Current goals can be found in the Care Plan section) Acute Rehab PT Goals Patient Stated Goal: to walk , go home PT Goal Formulation: With patient/family Time For Goal Achievement: 05/13/14 Potential to Achieve Goals: Good    Frequency Min 3X/week   Barriers to discharge        Co-evaluation               End of Session  Activity Tolerance: Patient tolerated treatment well Patient left: in chair;with call bell/phone within reach;with family/visitor present Nurse Communication: Mobility status         Time: 1749-4496 PT Time Calculation (min) (ACUTE ONLY): 16 min   Charges:   PT Evaluation $Initial PT Evaluation Tier I: 1 Procedure PT Treatments $Gait Training: 8-22 mins   PT G Codes:        [name] 04/29/2014, 11:21 AM Tresa Endo PT [phone]

## 2014-04-29 NOTE — Progress Notes (Signed)
1 Day Post-Op  Subjective: Doing ok.  No nausea/vomiting.    Objective: Vital signs in last 24 hours: Temp:  [97.5 F (36.4 C)-98.2 F (36.8 C)] 98.2 F (36.8 C) (01/13 0536) Pulse Rate:  [52-72] 62 (01/13 0536) Resp:  [14-27] 18 (01/13 0654) BP: (105-158)/(46-82) 146/63 mmHg (01/13 0536) SpO2:  [91 %-100 %] 95 % (01/13 0536) Weight:  [210 lb (95.255 kg)] 210 lb (95.255 kg) (01/12 1458) Last BM Date: 04/28/14  Intake/Output from previous day: 01/12 0701 - 01/13 0700 In: 3340 [P.O.:840; I.V.:2500] Out: 980 [Urine:980] Intake/Output this shift:    General appearance: alert, cooperative and no distress Resp: breathing comfortably GI: soft, bloated, approp tender, dressing c/d/i.   Extremities: extremities normal, atraumatic, no cyanosis or edema  Lab Results:   Recent Labs  04/28/14 1615 04/29/14 0526  WBC 14.6* 11.5*  HGB 10.6* 9.6*  HCT 34.9* 31.6*  PLT 304 261   BMET  Recent Labs  04/28/14 1615 04/29/14 0526  NA  --  137  K  --  4.0  CL  --  105  CO2  --  27  GLUCOSE  --  137*  BUN  --  12  CREATININE 0.74 0.60  CALCIUM  --  8.6   PT/INR No results for input(s): LABPROT, INR in the last 72 hours. ABG No results for input(s): PHART, HCO3 in the last 72 hours.  Invalid input(s): PCO2, PO2  Studies/Results: No results found.  Anti-infectives: Anti-infectives    Start     Dose/Rate Route Frequency Ordered Stop   04/28/14 1500  cefoTEtan (CEFOTAN) 2 g in dextrose 5 % 50 mL IVPB     2 g100 mL/hr over 30 Minutes Intravenous Every 12 hours 04/28/14 1457 04/28/14 1654   04/28/14 0531  metroNIDAZOLE (FLAGYL) IVPB 500 mg     500 mg100 mL/hr over 60 Minutes Intravenous On call to O.R. 04/28/14 0531 04/28/14 0749   04/28/14 0531  ciprofloxacin (CIPRO) IVPB 400 mg     400 mg200 mL/hr over 60 Minutes Intravenous On call to O.R. 04/28/14 0531 04/28/14 0810      Assessment/Plan: s/p Procedure(s): LAPAROSCOPIC  RIGHT HEMI-COLECTOMY (N/A) Continue foley  due to urinary output monitoring pulmonary toilet  COPD - inhalers Chronic iron deficiency anemia with ABL anemia - relatively stable.   Breast cancer - continue arimedex Clear liquids.     LOS: 1 day    [facility] 04/29/2014

## 2014-04-30 LAB — BASIC METABOLIC PANEL
Anion gap: 8 (ref 5–15)
CO2: 27 mmol/L (ref 19–32)
Calcium: 8.7 mg/dL (ref 8.4–10.5)
Chloride: 103 mEq/L (ref 96–112)
Creatinine, Ser: 0.46 mg/dL — ABNORMAL LOW (ref 0.50–1.10)
GFR calc Af Amer: >90 mL/min (ref >90)
GFR calc non Af Amer: >90 mL/min (ref >90)
Glucose, Bld: 123 mg/dL — ABNORMAL HIGH (ref 70–99)
Potassium: 3.7 mmol/L (ref 3.5–5.1)
Sodium: 138 mmol/L (ref 135–145)

## 2014-04-30 LAB — CBC
HCT: 34.1 % — ABNORMAL LOW (ref 36.0–46.0)
HEMOGLOBIN: 10.1 g/dL — AB (ref 12.0–15.0)
MCH: 25.9 pg — ABNORMAL LOW (ref 26.0–34.0)
MCHC: 29.6 g/dL — ABNORMAL LOW (ref 30.0–36.0)
MCV: 87.4 fL (ref 78.0–100.0)
Platelets: 261 10*3/uL (ref 150–400)
RBC: 3.9 MIL/uL (ref 3.87–5.11)
RDW: 19.8 % — ABNORMAL HIGH (ref 11.5–15.5)
WBC: 9 10*3/uL (ref 4.0–10.5)

## 2014-04-30 NOTE — Progress Notes (Signed)
PT Cancellation Note  Patient Details Name: Ann Maxwell MRN: [id number] DOB: December 14, 1940   Cancelled Treatment:    Reason Eval/Treat Not Completed:  (RN reports pt has ambulated multiple times today without assistance, just unplugging LINES)    [name] 04/30/2014, 5:15 PM Tresa Endo PT [phone]

## 2014-04-30 NOTE — Progress Notes (Signed)
Patient ID: Ann Maxwell, female   DOB: 02-13-1941, 74 y.o.   MRN: [id number] 2 Days Post-Op   Subjective: Pain better yesterday.  Passing gas.  Tolerated clear liquids.    Objective: Vital signs in last 24 hours: Temp:  [97.7 F (36.5 C)-98.9 F (37.2 C)] 98.3 F (36.8 C) (01/14 0600) Pulse Rate:  [64-118] 98 (01/14 0647) Resp:  [15-25] 18 (01/14 0600) BP: (147-184)/(46-99) 147/46 mmHg (01/14 0647) SpO2:  [92 %-98 %] 96 % (01/14 0600) FiO2 (%):  [43 %-48 %] 48 % (01/14 0459) Last BM Date: 04/29/14  Intake/Output from previous day: 01/13 0701 - 01/14 0700 In: 3600 [I.V.:3600] Out: 4450 [Urine:4450] Intake/Output this shift:    General appearance: alert, cooperative and no distress Resp: breathing comfortably GI: soft, bloated, approp tender, dressing c/d/i.   Extremities: extremities normal, atraumatic, no cyanosis or edema  Lab Results:   Recent Labs  04/28/14 1615 04/29/14 0526  WBC 14.6* 11.5*  HGB 10.6* 9.6*  HCT 34.9* 31.6*  PLT 304 261   BMET  Recent Labs  04/29/14 0526 04/30/14 0535  NA 137 138  K 4.0 3.7  CL 105 103  CO2 27 27  GLUCOSE 137* 123*  BUN 12 <5*  CREATININE 0.60 0.46*  CALCIUM 8.6 8.7   PT/INR No results for input(s): LABPROT, INR in the last 72 hours. ABG No results for input(s): PHART, HCO3 in the last 72 hours.  Invalid input(s): PCO2, PO2  Studies/Results: No results found.  Anti-infectives: Anti-infectives    Start     Dose/Rate Route Frequency Ordered Stop   04/28/14 1500  cefoTEtan (CEFOTAN) 2 g in dextrose 5 % 50 mL IVPB     2 g100 mL/hr over 30 Minutes Intravenous Every 12 hours 04/28/14 1457 04/28/14 1654   04/28/14 0531  metroNIDAZOLE (FLAGYL) IVPB 500 mg     500 mg100 mL/hr over 60 Minutes Intravenous On call to O.R. 04/28/14 0531 04/28/14 0749   04/28/14 0531  ciprofloxacin (CIPRO) IVPB 400 mg     400 mg200 mL/hr over 60 Minutes Intravenous On call to O.R. 04/28/14 0531 04/28/14 0810       Assessment/Plan: s/p Procedure(s): LAPAROSCOPIC  RIGHT HEMI-COLECTOMY (N/A)  Pulmonary toilet  COPD - inhalers Chronic iron deficiency anemia with ABL anemia - relatively stable.   Breast cancer - continue arimedex Full liquids Await pathology D/c foley    LOS: 2 days    [facility] 04/30/2014

## 2014-05-01 LAB — CBC
HCT: 32.9 % — ABNORMAL LOW (ref 36.0–46.0)
Hemoglobin: 9.7 g/dL — ABNORMAL LOW (ref 12.0–15.0)
MCH: 25.5 pg — ABNORMAL LOW (ref 26.0–34.0)
MCHC: 29.5 g/dL — ABNORMAL LOW (ref 30.0–36.0)
MCV: 86.4 fL (ref 78.0–100.0)
Platelets: 257 10*3/uL (ref 150–400)
RBC: 3.81 MIL/uL — ABNORMAL LOW (ref 3.87–5.11)
RDW: 19.4 % — ABNORMAL HIGH (ref 11.5–15.5)
WBC: 6.7 10*3/uL (ref 4.0–10.5)

## 2014-05-01 LAB — BASIC METABOLIC PANEL
Anion gap: 9 (ref 5–15)
CO2: 29 mmol/L (ref 19–32)
Calcium: 8.7 mg/dL (ref 8.4–10.5)
Chloride: 102 mEq/L (ref 96–112)
Creatinine, Ser: 0.47 mg/dL — ABNORMAL LOW (ref 0.50–1.10)
GFR calc non Af Amer: >90 mL/min (ref >90)
Glucose, Bld: 97 mg/dL (ref 70–99)
Potassium: 3.5 mmol/L (ref 3.5–5.1)
SODIUM: 140 mmol/L (ref 135–145)

## 2014-05-01 MED ORDER — TRAMADOL HCL 50 MG PO TABS: 50.0000 mg | ORAL_TABLET | Freq: Four times a day (QID) | ORAL | Status: DC | PRN

## 2014-05-01 MED ORDER — FLUTICASONE-SALMETEROL 250-50 MCG/DOSE IN AEPB
1.0000 | INHALATION_SPRAY | Freq: Two times a day (BID) | RESPIRATORY_TRACT | Status: DC
  Administered 2014-05-01 – 2014-05-02 (×3): 1 via RESPIRATORY_TRACT

## 2014-05-01 MED ORDER — OXYCODONE-ACETAMINOPHEN 5-325 MG PO TABS
1.0000 | ORAL_TABLET | ORAL | Status: DC | PRN
  Administered 2014-05-01 – 2014-05-02 (×4): 1 via ORAL
  Filled 2014-05-01 (×4): qty 1

## 2014-05-01 NOTE — Progress Notes (Signed)
  Patient ID: Ann Maxwell, female   DOB: 1940/07/14, 74 y.o.   MRN: [id number] 3 Days Post-Op   Subjective: Had BM.  No flatus.    Objective: Vital signs in last 24 hours: Temp:  [97.9 F (36.6 C)-98.6 F (37 C)] 97.9 F (36.6 C) (01/15 0600) Pulse Rate:  [80-87] 87 (01/15 0600) Resp:  [15-21] 20 (01/15 0744) BP: (131-160)/(49-63) 160/58 mmHg (01/15 0600) SpO2:  [95 %-99 %] 98 % (01/15 0600) FiO2 (%):  [41 %-44 %] 44 % (01/15 0458) Last BM Date: 04/30/14  Intake/Output from previous day: 01/14 0701 - 01/15 0700 In: 1695 [P.O.:840; I.V.:855] Out: 4750 [Urine:4750] Intake/Output this shift:    General appearance: alert, cooperative and no distress Resp: breathing comfortably GI: soft, bloated, approp tender, dressing c/d/i.   Extremities: extremities normal, atraumatic, no cyanosis or edema  Lab Results:   Recent Labs  04/30/14 0535 05/01/14 0522  WBC 9.0 6.7  HGB 10.1* 9.7*  HCT 34.1* 32.9*  PLT 261 257   BMET  Recent Labs  04/30/14 0535 05/01/14 0522  NA 138 140  K 3.7 3.5  CL 103 102  CO2 27 29  GLUCOSE 123* 97  BUN <5* <5*  CREATININE 0.46* 0.47*  CALCIUM 8.7 8.7   PT/INR No results for input(s): LABPROT, INR in the last 72 hours. ABG No results for input(s): PHART, HCO3 in the last 72 hours.  Invalid input(s): PCO2, PO2  Studies/Results: No results found.  Anti-infectives: Anti-infectives    Start     Dose/Rate Route Frequency Ordered Stop   04/28/14 1500  cefoTEtan (CEFOTAN) 2 g in dextrose 5 % 50 mL IVPB     2 g100 mL/hr over 30 Minutes Intravenous Every 12 hours 04/28/14 1457 04/28/14 1654   04/28/14 0531  metroNIDAZOLE (FLAGYL) IVPB 500 mg     500 mg100 mL/hr over 60 Minutes Intravenous On call to O.R. 04/28/14 0531 04/28/14 0749   04/28/14 0531  ciprofloxacin (CIPRO) IVPB 400 mg     400 mg200 mL/hr over 60 Minutes Intravenous On call to O.R. 04/28/14 0531 04/28/14 0810      Assessment/Plan: s/p Procedure(s): LAPAROSCOPIC   RIGHT HEMI-COLECTOMY (N/A)  Pulmonary toilet  COPD - inhalers Chronic iron deficiency anemia with ABL anemia - relatively stable.   Breast cancer - continue arimedex Advance to regular diet. PT3N0.   D/c foley D/c PCA    LOS: 3 days    [facility] 05/01/2014

## 2014-05-01 NOTE — Progress Notes (Signed)
Pharmacy Brief Note - Alvimopan (Entereg)  The standing order set for alvimopan (Entereg) now includes an automatic order to discontinue the drug after the patient has had a bowel movement. The change was approved by the [place] and the Medical Executive Committee.   This patient has had bowel movements documented by nursing. Therefore, alvimopan has been discontinued. If there are questions, please contact the pharmacy at [phone].   Thank you-  Dolly Rias RPh 05/01/2014, 9:39 AM Pager [phone]

## 2014-05-02 LAB — BASIC METABOLIC PANEL
ANION GAP: 8 (ref 5–15)
BUN: 6 mg/dL (ref 6–23)
CO2: 30 mmol/L (ref 19–32)
Calcium: 8.9 mg/dL (ref 8.4–10.5)
Chloride: 102 mEq/L (ref 96–112)
Creatinine, Ser: 0.56 mg/dL (ref 0.50–1.10)
GFR calc Af Amer: >90 mL/min (ref >90)
GFR calc non Af Amer: >90 mL/min (ref >90)
Glucose, Bld: 94 mg/dL (ref 70–99)
POTASSIUM: 3.4 mmol/L — AB (ref 3.5–5.1)
SODIUM: 140 mmol/L (ref 135–145)

## 2014-05-02 LAB — TYPE AND SCREEN
ABO/RH(D): O POS
ANTIBODY SCREEN: POSITIVE
DAT, IgG: NEGATIVE
Donor AG Type: NEGATIVE
PT AG Type: NEGATIVE
Unit division: 0

## 2014-05-02 LAB — CBC
HEMATOCRIT: 34.9 % — AB (ref 36.0–46.0)
Hemoglobin: 10.3 g/dL — ABNORMAL LOW (ref 12.0–15.0)
MCH: 25.4 pg — ABNORMAL LOW (ref 26.0–34.0)
MCHC: 29.5 g/dL — ABNORMAL LOW (ref 30.0–36.0)
MCV: 86 fL (ref 78.0–100.0)
PLATELETS: 290 10*3/uL (ref 150–400)
RBC: 4.06 MIL/uL (ref 3.87–5.11)
RDW: 19.1 % — AB (ref 11.5–15.5)
WBC: 5.6 10*3/uL (ref 4.0–10.5)

## 2014-05-02 MED ORDER — OXYCODONE HCL 5 MG PO TABS: 5.0000 mg | ORAL_TABLET | Freq: Four times a day (QID) | ORAL | Status: DC | PRN

## 2014-05-02 NOTE — Discharge Summary (Signed)
Reviewed discharge instructions with pt and daughter including medications, follow-up appointments, and incision care.  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  Pt being d/c into care of daughter.

## 2014-05-02 NOTE — Discharge Instructions (Signed)
Laparoscopic Colectomy, Care After Refer to this sheet in the next few weeks. These instructions provide you with information on caring for yourself after your procedure. Your health care provider may also give you more specific instructions. Your treatment has been planned according to current medical practices, but problems sometimes occur. Call your health care provider if you have any problems or questions after your procedure. WHAT TO EXPECT AFTER THE PROCEDURE After your procedure, it is typical to have the following:  Pain in your abdomen, especially at the incision sites. You will be given pain medicine to control the pain.  Tiredness. This is a normal part of the recovery process. Your energy level will return to normal over the next several weeks.  Constipation. You may be given stool softeners to prevent this. HOME CARE INSTRUCTIONS   Only take over-the-counter or prescription medicines as directed by your health care provider.  Ask your health care provider whether you may take a shower when you go home.  Remove or change any bandages (dressings) as directed.  You may resume a normal diet and activities as directed. Eat plenty of fruits and vegetables to help prevent constipation.  Drink enough fluids to keep your urine clear or pale yellow. This also helps prevent constipation.  Take rest breaks during the day as needed.  Avoid lifting anything heavier than 25 pounds (11.3 kg) or driving for 4 weeks or until your health care provider says it is okay.  Follow up with your health care provider as directed. Ask your health care provider when to make an appointment to get your stitches or staples removed. SEEK MEDICAL CARE IF:   You have increased bleeding from the incision areas.  You have redness, swelling, or increasing pain in the wounds.  You see pus coming from a wound.  You have a fever.  You notice a foul smell coming from the wound or dressing.  Your wound is  breaking open (edges not staying together) after sutures or staples have been removed. SEEK IMMEDIATE MEDICAL CARE IF:  You develop a rash.  You have chest pain or difficulty breathing.  You have pain or swelling in your legs.  You have lightheadedness or feel faint.  Your abdomen becomes larger (distended).  You have nausea or vomiting.  You have blood in your stools. Document Released: 10/21/2004 Document Revised: 01/22/2013 Document Reviewed: 11/13/2012 [facility] Patient Information 2015 [place], [place]. This information is not intended to replace advice given to you by your health care provider. Make sure you discuss any questions you have with your health care provider.

## 2014-05-04 ENCOUNTER — Encounter (HOSPITAL_COMMUNITY): Payer: Self-pay

## 2014-05-08 ENCOUNTER — Telehealth (INDEPENDENT_AMBULATORY_CARE_PROVIDER_SITE_OTHER): Payer: Self-pay | Admitting: General Surgery

## 2014-05-08 NOTE — Telephone Encounter (Signed)
Pt had R colectomy on 1/12.  Has been doing well until yesterday.  She developed severe right sided pain just under her ribs last night.  It hurts to stand up or squeeze her abdominal muscles.  She is eating well and having flatus and BM's.  She denies any nausea or any fevers.  It feels muscle related to her.  We discussed going back to a liquid diet and using heat on the sore area.  She was instructed to call back if the pain worsened or she developed fevers or any changes in her GI function.

## 2014-05-09 NOTE — Discharge Summary (Signed)
Physician Discharge Summary  Patient ID: Ann Maxwell MRN: [id number] DOB/AGE: November 09, 1940 74 y.o.  Admit date: 04/28/2014  Discharge date:  05/02/2014  Admission Diagnoses: Cecal cancer HTN Asthma Sinus Tachycardia Chronic anemia Left Breast cancer  Discharge Diagnoses:  Active Problems:   Cecal cancer Left breast cancer HTN Asthma  Discharged Condition: stable  Hospital Course:  Pt was admitted following a laparoscopic right hemicolectomy, She did well overnight.  She had less pain than expected, but she was bloated.  She started passing gas on POD 2.  Her diet was advanced.  She was able to void with her catheter removed.  She had a bowel movement.  She was able to transition to oral pain meds.  She was discharged to home in stable condition.    Consults: None  Significant Diagnostic Studies: labs: see epic  Treatments: surgery: see above  Discharge Exam: Blood pressure 110/48, pulse 78, temperature 98 F (36.7 C), temperature source Oral, resp. rate 18, height 5' 4.5" (1.638 m), weight 210 lb (95.255 kg), SpO2 97 %. General appearance: alert, cooperative and no distress Resp: breathing comfortably GI: soft, non tender, non distended.  wounds d/c/i.    Disposition: 01-Home or Self Care  Discharge Instructions    Diet - low sodium heart healthy    Complete by:  As directed      Increase activity slowly    Complete by:  As directed             Medication List    STOP taking these medications        metroNIDAZOLE 500 MG tablet  Commonly known as:  FLAGYL     neomycin 500 MG tablet  Commonly known as:  MYCIFRADIN     Omega 3 1200 MG Caps      TAKE these medications        acetaminophen 325 MG tablet  Commonly known as:  TYLENOL  Take 650 mg by mouth at bedtime as needed for mild pain.     anastrozole 1 MG tablet  Commonly known as:  ARIMIDEX  Take 1 tablet (1 mg total) by mouth daily.     cholecalciferol 1000 UNITS tablet  Commonly known as:   VITAMIN D  Take 2,000 Units by mouth daily.     Fluticasone-Salmeterol 250-50 MCG/DOSE Aepb  Commonly known as:  ADVAIR  Inhale 1 puff into the lungs 2 (two) times daily.     gabapentin 100 MG capsule  Commonly known as:  NEURONTIN  Take 1 capsule (100 mg total) by mouth 3 (three) times daily.     GRAPE SEED EXTRACT PO  Take 1 tablet by mouth daily.     ipratropium-albuterol 0.5-2.5 (3) MG/3ML Soln  Commonly known as:  DUONEB  Take 3 mLs by nebulization every 6 (six) hours as needed. For wheezing     iron polysaccharides 150 MG capsule  Commonly known as:  NIFEREX  Take 150 mg by mouth 2 (two) times daily.     loratadine 10 MG tablet  Commonly known as:  CLARITIN  Take 10 mg by mouth daily as needed. For allergies     losartan-hydrochlorothiazide 50-12.5 MG per tablet  Commonly known as:  HYZAAR  Take 1 tablet by mouth every morning.     magnesium oxide 400 MG tablet  Commonly known as:  MAG-OX  Take 400 mg by mouth daily.     montelukast 10 MG tablet  Commonly known as:  SINGULAIR  Take 10 mg by  mouth daily.     oxyCODONE 5 MG immediate release tablet  Commonly known as:  Oxy IR/ROXICODONE  Take 1 tablet (5 mg total) by mouth every 6 (six) hours as needed for moderate pain or severe pain.     PROAIR HFA 108 (90 BASE) MCG/ACT inhaler  Generic drug:  albuterol  Inhale 1-2 puffs into the lungs every 4 (four) hours as needed for wheezing.     Vitamin B-12 2500 MCG Subl  Place 1 tablet under the tongue daily.           Follow-up Information    Follow up with [facility], [place] In 2 weeks.   Specialty:  General Surgery   Contact information:   [address] [place]  [address] [phone]       Signed: Stark Klein 05/09/2014, 9:49 AM

## 2014-05-26 ENCOUNTER — Telehealth: Payer: Self-pay | Admitting: Hematology and Oncology

## 2014-05-26 NOTE — Telephone Encounter (Signed)
, °

## 2014-06-15 ENCOUNTER — Ambulatory Visit (HOSPITAL_BASED_OUTPATIENT_CLINIC_OR_DEPARTMENT_OTHER): Payer: Medicare PPO | Admitting: Hematology and Oncology

## 2014-06-15 ENCOUNTER — Ambulatory Visit: Payer: Medicare PPO

## 2014-06-15 ENCOUNTER — Ambulatory Visit (HOSPITAL_BASED_OUTPATIENT_CLINIC_OR_DEPARTMENT_OTHER): Payer: Medicare PPO

## 2014-06-15 ENCOUNTER — Other Ambulatory Visit: Payer: Self-pay | Admitting: *Deleted

## 2014-06-15 ENCOUNTER — Telehealth: Payer: Self-pay | Admitting: Hematology and Oncology

## 2014-06-15 VITALS — BP 127/66 | HR 75 | Temp 98.4°F | Resp 18 | Ht 64.5 in | Wt 211.9 lb

## 2014-06-15 DIAGNOSIS — C180 Malignant neoplasm of cecum: Secondary | ICD-10-CM

## 2014-06-15 DIAGNOSIS — C189 Malignant neoplasm of colon, unspecified: Secondary | ICD-10-CM

## 2014-06-15 DIAGNOSIS — C50512 Malignant neoplasm of lower-outer quadrant of left female breast: Secondary | ICD-10-CM

## 2014-06-15 DIAGNOSIS — Z853 Personal history of malignant neoplasm of breast: Secondary | ICD-10-CM

## 2014-06-15 DIAGNOSIS — Z79818 Long term (current) use of other agents affecting estrogen receptors and estrogen levels: Secondary | ICD-10-CM

## 2014-06-15 LAB — CBC WITH DIFFERENTIAL/PLATELET
BASO%: 1.1 % (ref 0.0–2.0)
BASOS ABS: 0.1 10*3/uL (ref 0.0–0.1)
EOS ABS: 0.5 10*3/uL (ref 0.0–0.5)
EOS%: 5 % (ref 0.0–7.0)
HCT: 40.1 % (ref 34.8–46.6)
HEMOGLOBIN: 12.3 g/dL (ref 11.6–15.9)
LYMPH%: 10.1 % — ABNORMAL LOW (ref 14.0–49.7)
MCH: 24.3 pg — AB (ref 25.1–34.0)
MCHC: 30.7 g/dL — ABNORMAL LOW (ref 31.5–36.0)
MCV: 79 fL — ABNORMAL LOW (ref 79.5–101.0)
MONO#: 0.7 10*3/uL (ref 0.1–0.9)
MONO%: 7.4 % (ref 0.0–14.0)
NEUT%: 76.4 % (ref 38.4–76.8)
NEUTROS ABS: 7 10*3/uL — AB (ref 1.5–6.5)
PLATELETS: 305 10*3/uL (ref 145–400)
RBC: 5.08 10*6/uL (ref 3.70–5.45)
RDW: 18.7 % — AB (ref 11.2–14.5)
WBC: 9.2 10*3/uL (ref 3.9–10.3)
lymph#: 0.9 10*3/uL (ref 0.9–3.3)

## 2014-06-15 LAB — COMPREHENSIVE METABOLIC PANEL (CC13)
ALBUMIN: 3.6 g/dL (ref 3.5–5.0)
ALK PHOS: 98 U/L (ref 40–150)
ALT: 13 U/L (ref 0–55)
AST: 14 U/L (ref 5–34)
Anion Gap: 6 mEq/L (ref 3–11)
BUN: 17.6 mg/dL (ref 7.0–26.0)
CO2: 27 mEq/L (ref 22–29)
Calcium: 9.5 mg/dL (ref 8.4–10.4)
Chloride: 106 mEq/L (ref 98–109)
Creatinine: 0.7 mg/dL (ref 0.6–1.1)
EGFR: 83 mL/min/{1.73_m2} — ABNORMAL LOW (ref >90)
GLUCOSE: 93 mg/dL (ref 70–140)
Potassium: 4.2 mEq/L (ref 3.5–5.1)
Sodium: 139 mEq/L (ref 136–145)
TOTAL PROTEIN: 6.7 g/dL (ref 6.4–8.3)
Total Bilirubin: 0.2 mg/dL (ref 0.20–1.20)

## 2014-06-15 MED ORDER — ANASTROZOLE 1 MG PO TABS: 1.0000 mg | ORAL_TABLET | Freq: Every day | ORAL | Status: DC

## 2014-06-15 NOTE — Progress Notes (Signed)
Patient Care Team: Leanna Battles, MD as PCP - General (Internal Medicine)  DIAGNOSIS: Primary cancer of lower outer quadrant of left female breast   Staging form: Breast, AJCC 7th Edition     Clinical stage from 12/20/2011: Stage 0 (Tis, N0, cM0) - Unsigned     Pathologic: No stage assigned - Unsigned Primary colon cancer   Staging form: Colon and Rectum, AJCC 7th Edition     Pathologic: Stage IIA (T3, N0, cM0) - Signed by Rulon Eisenmenger, MD on 06/15/2014   SUMMARY OF ONCOLOGIC HISTORY:   Primary colon cancer   03/20/2014 Initial Diagnosis Invasive adenocarcinoma of Caecum   04/28/2014 Surgery Right hemicolectomy: Invasive adenocarcinoma with mucinous features moderately differentiated invading subserosal tissue, margins negative, 25 lymph nodes negative, T3 N0 M0 stage II a, no microsatellite instability    CHIEF COMPLIANT: Follow-up after surgery for colon cancer  INTERVAL HISTORY: Ann Maxwell is a 74 year old lady with above-mentioned history of right colon cancer treated with hemicolectomy and she is here today to discuss the pathology report. We have presented her case in multidisciplinary tumor board and the recommendation was to do surveillance and observation. She had stage II disease. She is doing very well from the stomach standpoint without any new problems or concerns. She would like to refill her prescription for Arimidex today.  REVIEW OF SYSTEMS:   Constitutional: Denies fevers, chills or abnormal weight loss Eyes: Denies blurriness of vision Ears, nose, mouth, throat, and face: Denies mucositis or sore throat Respiratory: Denies cough, dyspnea or wheezes Cardiovascular: Denies palpitation, chest discomfort or lower extremity swelling Gastrointestinal:  Denies nausea, heartburn or change in bowel habits Skin: Denies abnormal skin rashes Lymphatics: Denies new lymphadenopathy or easy bruising Neurological:Denies numbness, tingling or new weaknesses Behavioral/Psych:  Mood is stable, no new changes  All other systems were reviewed with the patient and are negative.  I have reviewed the past medical history, past surgical history, social history and family history with the patient and they are unchanged from previous note.  ALLERGIES:  is allergic to hydrocodone; adhesive; and terramycin.  MEDICATIONS:  Current Outpatient Prescriptions  Medication Sig Dispense Refill  . acetaminophen (TYLENOL) 325 MG tablet Take 650 mg by mouth at bedtime as needed.    Marland Kitchen albuterol (PROVENTIL HFA;VENTOLIN HFA) 108 (90 BASE) MCG/ACT inhaler Inhale 2 puffs into the lungs every 4 (four) hours as needed for wheezing or shortness of breath.    . anastrozole (ARIMIDEX) 1 MG tablet Take 1 tablet (1 mg total) by mouth daily. 90 tablet 6  . Bioflavonoid Products (GRAPE SEED PO) Take 1 each by mouth.    . cholecalciferol (VITAMIN D) 1000 UNITS tablet Take 2,000 Units by mouth daily.    . Fluticasone-Salmeterol (ADVAIR) 250-50 MCG/DOSE AEPB Inhale 1-2 puffs into the lungs 2 (two) times daily.    Marland Kitchen ipratropium-albuterol (DUONEB) 0.5-2.5 (3) MG/3ML SOLN Take 3 mLs by nebulization every 6 (six) hours as needed.    . loratadine (CLARITIN) 10 MG tablet Take 10 mg by mouth daily as needed for allergies.    Marland Kitchen losartan-hydrochlorothiazide (HYZAAR) 50-12.5 MG per tablet Take 1 tablet by mouth daily.    . montelukast (SINGULAIR) 10 MG tablet Take 10 mg by mouth at bedtime.     No current facility-administered medications for this visit.    PHYSICAL EXAMINATION: ECOG PERFORMANCE STATUS: 0 - Asymptomatic  Filed Vitals:   06/15/14 0950  BP: 127/66  Pulse: 75  Temp: 98.4 F (36.9 C)  Resp: 18  Filed Weights   06/15/14 0950  Weight: 211 lb 14.4 oz (96.117 kg)    GENERAL:alert, no distress and comfortable SKIN: skin color, texture, turgor are normal, no rashes or significant lesions EYES: normal, Conjunctiva are pink and non-injected, sclera clear OROPHARYNX:no exudate, no erythema  and lips, buccal mucosa, and tongue normal  NECK: supple, thyroid normal size, non-tender, without nodularity LYMPH:  no palpable lymphadenopathy in the cervical, axillary or inguinal LUNGS: clear to auscultation and percussion with normal breathing effort HEART: regular rate & rhythm and no murmurs and no lower extremity edema ABDOMEN:abdomen soft, non-tender and normal bowel sounds Musculoskeletal:no cyanosis of digits and no clubbing  NEURO: alert & oriented x 3 with fluent speech, no focal motor/sensory deficits  LABORATORY DATA:  I have reviewed the data as listed   Chemistry      Component Value Date/Time   NA 140 05/02/2014 0553   NA 141 03/24/2014 1052   K 3.4* 05/02/2014 0553   K 4.1 03/24/2014 1052   CL 102 05/02/2014 0553   CL 102 09/23/2012 1028   CO2 30 05/02/2014 0553   CO2 28 03/24/2014 1052   BUN 6 05/02/2014 0553   BUN 14.3 03/24/2014 1052   CREATININE 0.56 05/02/2014 0553   CREATININE 0.7 03/24/2014 1052      Component Value Date/Time   CALCIUM 8.9 05/02/2014 0553   CALCIUM 9.3 03/24/2014 1052   ALKPHOS 96 04/23/2014 1115   ALKPHOS 100 03/24/2014 1052   AST 15 04/23/2014 1115   AST 11 03/24/2014 1052   ALT 12 04/23/2014 1115   ALT 10 03/24/2014 1052   BILITOT 0.6 04/23/2014 1115   BILITOT <0.20 03/24/2014 1052       Lab Results  Component Value Date   WBC 5.6 05/02/2014   HGB 10.3* 05/02/2014   HCT 34.9* 05/02/2014   MCV 86.0 05/02/2014   PLT 290 05/02/2014   NEUTROABS 6.9 04/23/2014    ASSESSMENT & PLAN:  Primary colon cancer Cecal adenocarcinoma diagnosed 04/22/2013, preop CEA of 5.1, status post right hemicolectomy 04/28/2014, grade 2, no MVI, 0/25 lymph nodes, subserosal involvement, T3 N0 M0 stage II A, no mismatch repair  Pathology counseling: I discussed with her extensively the details of pathology report and did not recommend systemic chemotherapy.   Treatment plan: Treatment would consist of surveillance with every three-month CEA  for the first 2 years CT scans annually and monitoring of her symptoms. Surveillance colonoscopy intervals as directed by surgery but she will need a colonoscopy in January 2017 at her one-year anniversary from diagnosis.  Since he is following with surgery as well, I will see her back in 3 months for follow-up with CBC CMP and CEA   Primary cancer of lower outer quadrant of left female breast Left breast cancer invasive ductal carcinoma: 0.5 cm with 9 mm area of DCIS status post left mastectomy ER/PR positive HER-2 negative Ki-67 17% currently on Arimidex 1 mg daily.  Arimidex toxicities: 1. Hot flashes: She takes gabapentin for hot flashes.  Breast cancer surveillance: 1. Breast exam 06/15/2014 is normal 2. Mammogram and ultrasound on 01/27/2014 were normal ultrasound was performed to evaluate a simple cyst  Return to clinic in 6 months for follow-up    Orders Placed This Encounter  Procedures  . CBC with Differential    Standing Status: Future     Number of Occurrences:      Standing Expiration Date: 06/15/2015  . Comprehensive metabolic panel (Cmet) - [facility]    Standing  Status: Future     Number of Occurrences:      Standing Expiration Date: 06/15/2015  . CEA    Standing Status: Future     Number of Occurrences:      Standing Expiration Date: 06/15/2015  . CBC with Differential    Standing Status: Future     Number of Occurrences:      Standing Expiration Date: 06/15/2015  . Comprehensive metabolic panel (Cmet) - [facility]    Standing Status: Future     Number of Occurrences:      Standing Expiration Date: 06/15/2015  . CEA    Standing Status: Future     Number of Occurrences:      Standing Expiration Date: 06/15/2015   The patient has a good understanding of the overall plan. she agrees with it. She will call with any problems that may develop before her next visit here.   Rulon Eisenmenger, MD

## 2014-06-15 NOTE — Assessment & Plan Note (Signed)
Left breast cancer invasive ductal carcinoma: 0.5 cm with 9 mm area of DCIS status post left mastectomy ER/PR positive HER-2 negative Ki-67 17% currently on Arimidex 1 mg daily.  Arimidex toxicities: 1. Hot flashes: She takes gabapentin for hot flashes.  Breast cancer surveillance: 1. Breast exam 06/15/2014 is normal 2. Mammogram and ultrasound on 01/27/2014 were normal ultrasound was performed to evaluate a simple cyst  Return to clinic in 6 months for follow-up

## 2014-06-15 NOTE — Assessment & Plan Note (Addendum)
Cecal adenocarcinoma diagnosed 04/22/2013, preop CEA of 5.1, status post right hemicolectomy 04/28/2014, grade 2, no MVI, 0/25 lymph nodes, subserosal involvement, T3 N0 M0 stage II A, no mismatch repair  Pathology counseling: I discussed with Ann Maxwell extensively the details of pathology report and did not recommend systemic chemotherapy.   Treatment plan: Treatment would consist of surveillance with every three-month CEA for the first 2 years CT scans annually and monitoring of Ann Maxwell symptoms. Surveillance colonoscopy intervals as directed by surgery but she will need a colonoscopy in January 2017 at Ann Maxwell one-year anniversary from diagnosis.  Since he is following with surgery as well, I will see Ann Maxwell back in 3 months for follow-up with CBC CMP and CEA

## 2014-06-15 NOTE — Telephone Encounter (Signed)
per pof ot sch pt appt-gave pt copy of sch °

## 2014-06-16 LAB — CEA: CEA: 1.1 ng/mL (ref 0.0–5.0)

## 2014-06-26 ENCOUNTER — Other Ambulatory Visit: Payer: Self-pay | Admitting: Hematology and Oncology

## 2014-07-23 ENCOUNTER — Other Ambulatory Visit: Payer: Self-pay | Admitting: Hematology and Oncology

## 2014-07-23 DIAGNOSIS — C50512 Malignant neoplasm of lower-outer quadrant of left female breast: Secondary | ICD-10-CM

## 2014-08-08 ENCOUNTER — Other Ambulatory Visit: Payer: Self-pay | Admitting: Gastroenterology

## 2014-09-16 NOTE — Assessment & Plan Note (Signed)
Cecal adenocarcinoma diagnosed 04/22/2013, preop CEA of 5.1, status post right hemicolectomy 04/28/2014, grade 2, no MVI, 0/25 lymph nodes, subserosal involvement, T3 N0 M0 stage II A, no mismatch repair  Current treatment: Surveillance with CEA every 3 months Colonoscopy and CT scan in one year in January 2017

## 2014-09-16 NOTE — Assessment & Plan Note (Signed)
Left breast cancer invasive ductal carcinoma: 0.5 cm with 9 mm area of DCIS status post left mastectomy 01/04/2012 ER/PR positive HER-2 negative Ki-67 17% currently on Arimidex 1 mg daily.  Arimidex toxicities: 1. Hot flashes: She takes gabapentin for hot flashes.  Breast cancer surveillance: 1. Breast exam 09/17/2014 is normal 2. Mammogram and ultrasound on 01/27/2014 were normal ultrasound was performed to evaluate a simple cyst  Return to clinic in 6 months for follow-up

## 2014-09-17 ENCOUNTER — Other Ambulatory Visit: Payer: Self-pay | Admitting: *Deleted

## 2014-09-17 ENCOUNTER — Telehealth: Payer: Self-pay | Admitting: Hematology and Oncology

## 2014-09-17 ENCOUNTER — Ambulatory Visit (HOSPITAL_BASED_OUTPATIENT_CLINIC_OR_DEPARTMENT_OTHER): Payer: Medicare PPO | Admitting: Hematology and Oncology

## 2014-09-17 ENCOUNTER — Other Ambulatory Visit (HOSPITAL_BASED_OUTPATIENT_CLINIC_OR_DEPARTMENT_OTHER): Payer: Medicare PPO

## 2014-09-17 VITALS — BP 118/71 | HR 80 | Temp 97.9°F | Resp 18 | Ht 64.5 in

## 2014-09-17 DIAGNOSIS — Z79811 Long term (current) use of aromatase inhibitors: Secondary | ICD-10-CM

## 2014-09-17 DIAGNOSIS — C180 Malignant neoplasm of cecum: Secondary | ICD-10-CM | POA: Diagnosis not present

## 2014-09-17 DIAGNOSIS — C189 Malignant neoplasm of colon, unspecified: Secondary | ICD-10-CM

## 2014-09-17 DIAGNOSIS — C50512 Malignant neoplasm of lower-outer quadrant of left female breast: Secondary | ICD-10-CM

## 2014-09-17 DIAGNOSIS — Z170 Estrogen receptor positive status [ER+]: Secondary | ICD-10-CM | POA: Diagnosis not present

## 2014-09-17 LAB — CBC WITH DIFFERENTIAL/PLATELET
BASO%: 1.1 % (ref 0.0–2.0)
BASOS ABS: 0.1 10*3/uL (ref 0.0–0.1)
EOS%: 4.6 % (ref 0.0–7.0)
Eosinophils Absolute: 0.3 10*3/uL (ref 0.0–0.5)
HEMATOCRIT: 42.6 % (ref 34.8–46.6)
HGB: 13.9 g/dL (ref 11.6–15.9)
LYMPH%: 13.7 % — ABNORMAL LOW (ref 14.0–49.7)
MCH: 27.2 pg (ref 25.1–34.0)
MCHC: 32.6 g/dL (ref 31.5–36.0)
MCV: 83.4 fL (ref 79.5–101.0)
MONO#: 0.6 10*3/uL (ref 0.1–0.9)
MONO%: 8.8 % (ref 0.0–14.0)
NEUT#: 4.6 10*3/uL (ref 1.5–6.5)
NEUT%: 71.8 % (ref 38.4–76.8)
Platelets: 220 10*3/uL (ref 145–400)
RBC: 5.11 10*6/uL (ref 3.70–5.45)
RDW: 17.3 % — ABNORMAL HIGH (ref 11.2–14.5)
WBC: 6.5 10*3/uL (ref 3.9–10.3)
lymph#: 0.9 10*3/uL (ref 0.9–3.3)

## 2014-09-17 LAB — COMPREHENSIVE METABOLIC PANEL (CC13)
ALBUMIN: 3.8 g/dL (ref 3.5–5.0)
ALT: 17 U/L (ref 0–55)
ANION GAP: 10 meq/L (ref 3–11)
AST: 17 U/L (ref 5–34)
Alkaline Phosphatase: 91 U/L (ref 40–150)
BUN: 15.4 mg/dL (ref 7.0–26.0)
CHLORIDE: 104 meq/L (ref 98–109)
CO2: 29 mEq/L (ref 22–29)
Calcium: 9.8 mg/dL (ref 8.4–10.4)
Creatinine: 0.7 mg/dL (ref 0.6–1.1)
EGFR: 81 mL/min/{1.73_m2} — ABNORMAL LOW (ref >90)
Glucose: 102 mg/dl (ref 70–140)
Potassium: 3.9 mEq/L (ref 3.5–5.1)
Sodium: 143 mEq/L (ref 136–145)
TOTAL PROTEIN: 6.9 g/dL (ref 6.4–8.3)
Total Bilirubin: 0.56 mg/dL (ref 0.20–1.20)

## 2014-09-17 MED ORDER — CELECOXIB 200 MG PO CAPS: 200.0000 mg | ORAL_CAPSULE | Freq: Two times a day (BID) | ORAL | Status: DC

## 2014-09-17 MED ORDER — ANASTROZOLE 1 MG PO TABS: 1.0000 mg | ORAL_TABLET | Freq: Every day | ORAL | Status: DC

## 2014-09-17 NOTE — Progress Notes (Signed)
Patient Care Team: Leanna Battles, MD as PCP - General (Internal Medicine)  DIAGNOSIS: Primary cancer of lower outer quadrant of left female breast   Staging form: Breast, AJCC 7th Edition     Clinical stage from 12/20/2011: Stage 0 (Tis, N0, cM0) - Unsigned     Pathologic: No stage assigned - Unsigned Primary colon cancer   Staging form: Colon and Rectum, AJCC 7th Edition     Pathologic: Stage IIA (T3, N0, cM0) - Signed by Rulon Eisenmenger, MD on 06/15/2014   SUMMARY OF ONCOLOGIC HISTORY:   Primary cancer of lower outer quadrant of left female breast   01/04/2012 Surgery Left mastectomy: Invasive ductal carcinoma 0.5 cm with high-grade DCIS 9 cm, margins negative, 0/6 lymph nodes, ER 100%, PR 100%, Ki-67 17%, HER-2 negative ratio 1.16   02/07/2012 -  Anti-estrogen oral therapy Anastrozole 1 mg daily    Primary colon cancer   03/20/2014 Initial Diagnosis Invasive adenocarcinoma of Caecum   04/28/2014 Surgery Right hemicolectomy: Invasive adenocarcinoma with mucinous features moderately differentiated invading subserosal tissue, margins negative, 25 lymph nodes negative, T3 N0 M0 stage II a, no microsatellite instability    CHIEF COMPLIANT: Follow-up of breast cancer and colon cancer  INTERVAL HISTORY: Ann Maxwell is a 74 year old with above-mentioned history of left breast cancer diagnosed in 2013 and is currently an oral antiestrogen therapy with anastrozole. She is tolerating it extremely well without any major problems. In 2015 she was diagnosed with cecal cancer and underwent a right hemicolectomy. It was stage II and she did not need adjuvant chemotherapy. She is currently on surveillance for the colon cancer as well. Patient has hot flashes related to anastrozole, she complains of scalp irritation.  REVIEW OF SYSTEMS:   Constitutional: Denies fevers, chills or abnormal weight loss Eyes: Denies blurriness of vision Ears, nose, mouth, throat, and face: Denies mucositis or sore  throat Respiratory: Denies cough, dyspnea or wheezes Cardiovascular: Denies palpitation, chest discomfort or lower extremity swelling Gastrointestinal:  Denies nausea, heartburn or change in bowel habits Skin: Denies abnormal skin rashes Lymphatics: Denies new lymphadenopathy or easy bruising Neurological:Denies numbness, tingling or new weaknesses Behavioral/Psych: Mood is stable, no new changes  Breast:  denies any pain or lumps or nodules in either breasts All other systems were reviewed with the patient and are negative.  I have reviewed the past medical history, past surgical history, social history and family history with the patient and they are unchanged from previous note.  ALLERGIES:  is allergic to hydrocodone; adhesive; and terramycin.  MEDICATIONS:  Current Outpatient Prescriptions  Medication Sig Dispense Refill  . acetaminophen (TYLENOL) 325 MG tablet Take 650 mg by mouth at bedtime as needed.    Marland Kitchen ADVAIR DISKUS 250-50 MCG/DOSE AEPB Inhale 1 puff into the lungs 2 (two) times daily. 180 each 0  . albuterol (PROVENTIL HFA;VENTOLIN HFA) 108 (90 BASE) MCG/ACT inhaler Inhale 2 puffs into the lungs every 4 (four) hours as needed for wheezing or shortness of breath.    . anastrozole (ARIMIDEX) 1 MG tablet Take 1 tablet (1 mg total) by mouth daily. 90 tablet 6  . Bioflavonoid Products (GRAPE SEED PO) Take 1 each by mouth.    . cholecalciferol (VITAMIN D) 1000 UNITS tablet Take 2,000 Units by mouth daily.    . Fluticasone-Salmeterol (ADVAIR) 250-50 MCG/DOSE AEPB Inhale 1-2 puffs into the lungs 2 (two) times daily.    Marland Kitchen ipratropium-albuterol (DUONEB) 0.5-2.5 (3) MG/3ML SOLN Take 3 mLs by nebulization every 6 (six) hours as  needed.    . loratadine (CLARITIN) 10 MG tablet Take 10 mg by mouth daily as needed for allergies.    Marland Kitchen losartan-hydrochlorothiazide (HYZAAR) 50-12.5 MG per tablet Take 1 tablet by mouth daily.    . montelukast (SINGULAIR) 10 MG tablet Take 10 mg by mouth at  bedtime.     No current facility-administered medications for this visit.    PHYSICAL EXAMINATION: ECOG PERFORMANCE STATUS: 0 - Asymptomatic  Filed Vitals:   09/17/14 0849  BP: 118/71  Pulse: 80  Temp: 97.9 F (36.6 C)  Resp: 18   Filed Weights    GENERAL:alert, no distress and comfortable SKIN: skin color, texture, turgor are normal, no rashes or significant lesions EYES: normal, Conjunctiva are pink and non-injected, sclera clear OROPHARYNX:no exudate, no erythema and lips, buccal mucosa, and tongue normal  NECK: supple, thyroid normal size, non-tender, without nodularity LYMPH:  no palpable lymphadenopathy in the cervical, axillary or inguinal LUNGS: clear to auscultation and percussion with normal breathing effort HEART: regular rate & rhythm and no murmurs and no lower extremity edema ABDOMEN:abdomen soft, non-tender and normal bowel sounds Musculoskeletal:no cyanosis of digits and no clubbing  NEURO: alert & oriented x 3 with fluent speech, no focal motor/sensory deficits BREAST: No palpable masses or nodules in either right or left breasts. No palpable axillary supraclavicular or infraclavicular adenopathy no breast tenderness or nipple discharge. (exam performed in the presence of a chaperone)  LABORATORY DATA:  I have reviewed the data as listed   Chemistry      Component Value Date/Time   NA 139 06/15/2014 1111   NA 140 05/02/2014 0553   K 4.2 06/15/2014 1111   K 3.4* 05/02/2014 0553   CL 102 05/02/2014 0553   CL 102 09/23/2012 1028   CO2 27 06/15/2014 1111   CO2 30 05/02/2014 0553   BUN 17.6 06/15/2014 1111   BUN 6 05/02/2014 0553   CREATININE 0.7 06/15/2014 1111   CREATININE 0.56 05/02/2014 0553      Component Value Date/Time   CALCIUM 9.5 06/15/2014 1111   CALCIUM 8.9 05/02/2014 0553   ALKPHOS 98 06/15/2014 1111   ALKPHOS 96 04/23/2014 1115   AST 14 06/15/2014 1111   AST 15 04/23/2014 1115   ALT 13 06/15/2014 1111   ALT 12 04/23/2014 1115    BILITOT 0.20 06/15/2014 1111   BILITOT 0.6 04/23/2014 1115       Lab Results  Component Value Date   WBC 6.5 09/17/2014   HGB 13.9 09/17/2014   HCT 42.6 09/17/2014   MCV 83.4 09/17/2014   PLT 220 09/17/2014   NEUTROABS 4.6 09/17/2014   ASSESSMENT & PLAN:  Primary cancer of lower outer quadrant of left female breast Left breast cancer invasive ductal carcinoma: 0.5 cm with 9 mm area of DCIS status post left mastectomy 01/04/2012 ER/PR positive HER-2 negative Ki-67 17% currently on Arimidex 1 mg daily.  Arimidex toxicities: 1. Hot flashes: She takes gabapentin for hot flashes. 2. Arthritis in her knees: Patient takes Celebrex. I discussed with her that Celebrex may have protective benefits for colon cancer. However she needs to discuss with her family physician about her underlying cardiac risks for long-term Celebrex therapy. She will take it currently on an as-needed basis.  Breast cancer surveillance: 1. Breast exam 09/17/2014 is normal 2. Mammogram and ultrasound on 01/27/2014 were normal ultrasound was performed to evaluate a simple cyst  Return to clinic in 6 months for follow-up   Primary colon cancer Cecal adenocarcinoma diagnosed  04/22/2013, preop CEA of 5.1, status post right hemicolectomy 04/28/2014, grade 2, no MVI, 0/25 lymph nodes, subserosal involvement, T3 N0 M0 stage II A, no mismatch repair  Current treatment: Surveillance with CEA every 3 months Colonoscopy and CT scan in one year in January 2017 Anemia has resolved hemoglobin is 13.9 today CEA is pending  No orders of the defined types were placed in this encounter.   The patient has a good understanding of the overall plan. she agrees with it. she will call with any problems that may develop before the next visit here.   Rulon Eisenmenger, MD

## 2014-09-17 NOTE — Telephone Encounter (Signed)
Gave patient avs report and appointments for January 2017. [facility] will call re ct - patient aware.  °

## 2014-09-18 LAB — CEA: CEA: 0.7 ng/mL (ref 0.0–5.0)

## 2014-10-12 ENCOUNTER — Other Ambulatory Visit: Payer: Self-pay

## 2014-12-07 DIAGNOSIS — C50912 Malignant neoplasm of unspecified site of left female breast: Secondary | ICD-10-CM | POA: Diagnosis not present

## 2014-12-07 DIAGNOSIS — C189 Malignant neoplasm of colon, unspecified: Secondary | ICD-10-CM | POA: Diagnosis not present

## 2014-12-22 ENCOUNTER — Other Ambulatory Visit: Payer: Self-pay

## 2014-12-22 DIAGNOSIS — Z1231 Encounter for screening mammogram for malignant neoplasm of breast: Secondary | ICD-10-CM

## 2014-12-24 DIAGNOSIS — R8299 Other abnormal findings in urine: Secondary | ICD-10-CM | POA: Diagnosis not present

## 2014-12-24 DIAGNOSIS — N390 Urinary tract infection, site not specified: Secondary | ICD-10-CM | POA: Diagnosis not present

## 2014-12-24 DIAGNOSIS — E785 Hyperlipidemia, unspecified: Secondary | ICD-10-CM | POA: Diagnosis not present

## 2014-12-24 DIAGNOSIS — D649 Anemia, unspecified: Secondary | ICD-10-CM | POA: Diagnosis not present

## 2014-12-25 DIAGNOSIS — M1711 Unilateral primary osteoarthritis, right knee: Secondary | ICD-10-CM | POA: Diagnosis not present

## 2014-12-25 DIAGNOSIS — M170 Bilateral primary osteoarthritis of knee: Secondary | ICD-10-CM | POA: Diagnosis not present

## 2014-12-30 ENCOUNTER — Other Ambulatory Visit: Payer: Self-pay | Admitting: Hematology and Oncology

## 2014-12-31 DIAGNOSIS — Z0000 Encounter for general adult medical examination without abnormal findings: Secondary | ICD-10-CM | POA: Diagnosis not present

## 2014-12-31 DIAGNOSIS — M545 Low back pain: Secondary | ICD-10-CM | POA: Diagnosis not present

## 2014-12-31 DIAGNOSIS — D509 Iron deficiency anemia, unspecified: Secondary | ICD-10-CM | POA: Diagnosis not present

## 2014-12-31 DIAGNOSIS — E785 Hyperlipidemia, unspecified: Secondary | ICD-10-CM | POA: Diagnosis not present

## 2014-12-31 DIAGNOSIS — Z1389 Encounter for screening for other disorder: Secondary | ICD-10-CM | POA: Diagnosis not present

## 2014-12-31 DIAGNOSIS — C50919 Malignant neoplasm of unspecified site of unspecified female breast: Secondary | ICD-10-CM | POA: Diagnosis not present

## 2014-12-31 DIAGNOSIS — C189 Malignant neoplasm of colon, unspecified: Secondary | ICD-10-CM | POA: Diagnosis not present

## 2014-12-31 DIAGNOSIS — Z23 Encounter for immunization: Secondary | ICD-10-CM | POA: Diagnosis not present

## 2014-12-31 DIAGNOSIS — I10 Essential (primary) hypertension: Secondary | ICD-10-CM | POA: Diagnosis not present

## 2015-01-13 DIAGNOSIS — Z1212 Encounter for screening for malignant neoplasm of rectum: Secondary | ICD-10-CM | POA: Diagnosis not present

## 2015-01-25 ENCOUNTER — Other Ambulatory Visit: Payer: Self-pay | Admitting: Hematology and Oncology

## 2015-01-25 DIAGNOSIS — C50512 Malignant neoplasm of lower-outer quadrant of left female breast: Secondary | ICD-10-CM

## 2015-02-01 ENCOUNTER — Ambulatory Visit
Admission: RE | Admit: 2015-02-01 | Discharge: 2015-02-01 | Disposition: A | Discharge status: Home / Self Care | Payer: Medicare PPO | Source: Ambulatory Visit

## 2015-02-01 DIAGNOSIS — Z1231 Encounter for screening mammogram for malignant neoplasm of breast: Secondary | ICD-10-CM | POA: Diagnosis not present

## 2015-02-12 DIAGNOSIS — M170 Bilateral primary osteoarthritis of knee: Secondary | ICD-10-CM | POA: Diagnosis not present

## 2015-02-23 DIAGNOSIS — M1712 Unilateral primary osteoarthritis, left knee: Secondary | ICD-10-CM | POA: Diagnosis not present

## 2015-02-26 DIAGNOSIS — C50912 Malignant neoplasm of unspecified site of left female breast: Secondary | ICD-10-CM | POA: Diagnosis not present

## 2015-03-03 DIAGNOSIS — M1712 Unilateral primary osteoarthritis, left knee: Secondary | ICD-10-CM | POA: Diagnosis not present

## 2015-03-10 DIAGNOSIS — M1712 Unilateral primary osteoarthritis, left knee: Secondary | ICD-10-CM | POA: Diagnosis not present

## 2015-03-16 ENCOUNTER — Encounter: Payer: Self-pay | Admitting: Gastroenterology

## 2015-03-16 ENCOUNTER — Other Ambulatory Visit: Payer: Self-pay

## 2015-03-17 ENCOUNTER — Telehealth: Payer: Self-pay | Admitting: Hematology and Oncology

## 2015-03-17 NOTE — Telephone Encounter (Signed)
s.w. pt and advised on moved appt.Marland KitchenMarland KitchenMarland KitchenMarland Kitchenpt ok and aware

## 2015-03-23 DIAGNOSIS — C50912 Malignant neoplasm of unspecified site of left female breast: Secondary | ICD-10-CM | POA: Diagnosis not present

## 2015-04-23 ENCOUNTER — Telehealth: Payer: Self-pay | Admitting: Hematology and Oncology

## 2015-04-23 NOTE — Telephone Encounter (Signed)
Due to patient having a ct scan on 1/12 lab for 1/31 moved to 1/11 to be prior to ct. Request came via email (outlook) from central scheduling. Ok per [employer]. Spoke with patient she is aware of lab that 1/31 lab has been cxd and she will have lab 1/11 - ct 1/12 and CG as scheduled 2/7.

## 2015-04-27 ENCOUNTER — Other Ambulatory Visit: Payer: Self-pay

## 2015-04-27 DIAGNOSIS — D500 Iron deficiency anemia secondary to blood loss (chronic): Secondary | ICD-10-CM

## 2015-04-27 DIAGNOSIS — C50512 Malignant neoplasm of lower-outer quadrant of left female breast: Secondary | ICD-10-CM

## 2015-04-27 DIAGNOSIS — C189 Malignant neoplasm of colon, unspecified: Secondary | ICD-10-CM

## 2015-04-28 ENCOUNTER — Other Ambulatory Visit (HOSPITAL_BASED_OUTPATIENT_CLINIC_OR_DEPARTMENT_OTHER): Payer: Medicare Other

## 2015-04-28 ENCOUNTER — Ambulatory Visit (AMBULATORY_SURGERY_CENTER): Payer: Self-pay | Admitting: *Deleted

## 2015-04-28 VITALS — Ht 64.0 in | Wt 215.0 lb

## 2015-04-28 DIAGNOSIS — C180 Malignant neoplasm of cecum: Secondary | ICD-10-CM

## 2015-04-28 DIAGNOSIS — C50512 Malignant neoplasm of lower-outer quadrant of left female breast: Secondary | ICD-10-CM | POA: Diagnosis not present

## 2015-04-28 DIAGNOSIS — Z85038 Personal history of other malignant neoplasm of large intestine: Secondary | ICD-10-CM

## 2015-04-28 DIAGNOSIS — C189 Malignant neoplasm of colon, unspecified: Secondary | ICD-10-CM

## 2015-04-28 DIAGNOSIS — D500 Iron deficiency anemia secondary to blood loss (chronic): Secondary | ICD-10-CM | POA: Diagnosis not present

## 2015-04-28 LAB — CBC WITH DIFFERENTIAL/PLATELET
BASO%: 1.1 % (ref 0.0–2.0)
Basophils Absolute: 0.1 10*3/uL (ref 0.0–0.1)
EOS ABS: 0.3 10*3/uL (ref 0.0–0.5)
EOS%: 3.3 % (ref 0.0–7.0)
HCT: 47.6 % — ABNORMAL HIGH (ref 34.8–46.6)
HGB: 15.4 g/dL (ref 11.6–15.9)
LYMPH%: 10.3 % — AB (ref 14.0–49.7)
MCH: 28.1 pg (ref 25.1–34.0)
MCHC: 32.3 g/dL (ref 31.5–36.0)
MCV: 87.2 fL (ref 79.5–101.0)
MONO#: 0.5 10*3/uL (ref 0.1–0.9)
MONO%: 5.2 % (ref 0.0–14.0)
NEUT%: 80.1 % — ABNORMAL HIGH (ref 38.4–76.8)
NEUTROS ABS: 7.1 10*3/uL — AB (ref 1.5–6.5)
PLATELETS: 244 10*3/uL (ref 145–400)
RBC: 5.46 10*6/uL — ABNORMAL HIGH (ref 3.70–5.45)
RDW: 13.1 % (ref 11.2–14.5)
WBC: 8.9 10*3/uL (ref 3.9–10.3)
lymph#: 0.9 10*3/uL (ref 0.9–3.3)

## 2015-04-28 LAB — COMPREHENSIVE METABOLIC PANEL
ALBUMIN: 4 g/dL (ref 3.5–5.0)
ALK PHOS: 101 U/L (ref 40–150)
ALT: 23 U/L (ref 0–55)
ANION GAP: 9 meq/L (ref 3–11)
AST: 19 U/L (ref 5–34)
BILIRUBIN TOTAL: 0.59 mg/dL (ref 0.20–1.20)
BUN: 13.4 mg/dL (ref 7.0–26.0)
CALCIUM: 10 mg/dL (ref 8.4–10.4)
CO2: 28 mEq/L (ref 22–29)
Chloride: 103 mEq/L (ref 98–109)
Creatinine: 0.8 mg/dL (ref 0.6–1.1)
EGFR: 76 mL/min/{1.73_m2} — AB (ref >90)
Glucose: 111 mg/dl (ref 70–140)
Potassium: 3.6 mEq/L (ref 3.5–5.1)
Sodium: 140 mEq/L (ref 136–145)
TOTAL PROTEIN: 7.4 g/dL (ref 6.4–8.3)

## 2015-04-28 LAB — IRON AND TIBC
%SAT: 24 % (ref 21–57)
IRON: 93 ug/dL (ref 41–142)
TIBC: 393 ug/dL (ref 236–444)
UIBC: 300 ug/dL (ref 120–384)

## 2015-04-28 LAB — FERRITIN: Ferritin: 67 ng/ml (ref 9–269)

## 2015-04-28 MED ORDER — NA SULFATE-K SULFATE-MG SULF 17.5-3.13-1.6 GM/177ML PO SOLN: 1.0000 | Freq: Once | ORAL | Status: DC

## 2015-04-28 NOTE — Progress Notes (Signed)
No egg or soy allergy. No anesthesia problems.  No home O2.  No diet meds.  

## 2015-04-29 ENCOUNTER — Encounter (HOSPITAL_COMMUNITY): Payer: Self-pay

## 2015-04-29 ENCOUNTER — Other Ambulatory Visit: Payer: Medicare PPO

## 2015-04-29 ENCOUNTER — Ambulatory Visit (HOSPITAL_COMMUNITY)
Admission: RE | Admit: 2015-04-29 | Discharge: 2015-04-29 | Disposition: A | Discharge status: Home / Self Care | Payer: Medicare Other | Source: Ambulatory Visit | Attending: Hematology and Oncology | Admitting: Hematology and Oncology

## 2015-04-29 DIAGNOSIS — Z85038 Personal history of other malignant neoplasm of large intestine: Secondary | ICD-10-CM | POA: Diagnosis present

## 2015-04-29 DIAGNOSIS — N8189 Other female genital prolapse: Secondary | ICD-10-CM | POA: Diagnosis not present

## 2015-04-29 DIAGNOSIS — Z08 Encounter for follow-up examination after completed treatment for malignant neoplasm: Secondary | ICD-10-CM | POA: Diagnosis present

## 2015-04-29 DIAGNOSIS — R918 Other nonspecific abnormal finding of lung field: Secondary | ICD-10-CM | POA: Insufficient documentation

## 2015-04-29 DIAGNOSIS — N329 Bladder disorder, unspecified: Secondary | ICD-10-CM | POA: Insufficient documentation

## 2015-04-29 DIAGNOSIS — Z9049 Acquired absence of other specified parts of digestive tract: Secondary | ICD-10-CM | POA: Insufficient documentation

## 2015-04-29 DIAGNOSIS — I2510 Atherosclerotic heart disease of native coronary artery without angina pectoris: Secondary | ICD-10-CM | POA: Diagnosis not present

## 2015-04-29 DIAGNOSIS — C189 Malignant neoplasm of colon, unspecified: Secondary | ICD-10-CM

## 2015-04-29 LAB — CEA: CEA1: 1.8 ng/mL (ref 0.0–4.7)

## 2015-04-29 MED ORDER — IOHEXOL 300 MG/ML  SOLN
100.0000 mL | Freq: Once | INTRAMUSCULAR | Status: AC | PRN
  Administered 2015-04-29: 100 mL via INTRAVENOUS

## 2015-05-03 ENCOUNTER — Other Ambulatory Visit: Payer: Self-pay | Admitting: *Deleted

## 2015-05-03 DIAGNOSIS — R935 Abnormal findings on diagnostic imaging of other abdominal regions, including retroperitoneum: Secondary | ICD-10-CM

## 2015-05-04 ENCOUNTER — Telehealth: Payer: Self-pay | Admitting: *Deleted

## 2015-05-04 ENCOUNTER — Other Ambulatory Visit (INDEPENDENT_AMBULATORY_CARE_PROVIDER_SITE_OTHER): Payer: Medicare Other

## 2015-05-04 DIAGNOSIS — R935 Abnormal findings on diagnostic imaging of other abdominal regions, including retroperitoneum: Secondary | ICD-10-CM

## 2015-05-04 LAB — URINALYSIS, ROUTINE W REFLEX MICROSCOPIC
Bilirubin Urine: NEGATIVE
Ketones, ur: NEGATIVE
Leukocytes, UA: NEGATIVE
Nitrite: NEGATIVE
PH: 6 (ref 5.0–8.0)
SPECIFIC GRAVITY, URINE: 1.02 (ref 1.000–1.030)
TOTAL PROTEIN, URINE-UPE24: NEGATIVE
URINE GLUCOSE: NEGATIVE
UROBILINOGEN UA: 0.2 (ref 0.0–1.0)

## 2015-05-04 MED ORDER — DIAZEPAM 5 MG PO TABS: ORAL_TABLET | ORAL | Status: DC

## 2015-05-04 NOTE — Telephone Encounter (Signed)
Scheduled MRCP on 05/07/15 at 9:00 PM at [facility]. NPO 4 hours prior. Patient notified with appointment date, time and instructions. She is asking for a Valium to help her get through the MRI. Please, advise.

## 2015-05-04 NOTE — Telephone Encounter (Signed)
Called Rx to pharmacy. Left a message for patient to call back.

## 2015-05-04 NOTE — Telephone Encounter (Signed)
Is Valium 5 mg po once 30 minutes prior to MRI ok?

## 2015-05-04 NOTE — Telephone Encounter (Signed)
Yes that's fine. thanks

## 2015-05-04 NOTE — Telephone Encounter (Signed)
We can give her a dose of valium to help with the MRI if she needs it, that's ok. Thanks

## 2015-05-05 NOTE — Telephone Encounter (Signed)
Patient notified of rx sent.

## 2015-05-06 ENCOUNTER — Ambulatory Visit: Payer: Medicare PPO | Admitting: Hematology and Oncology

## 2015-05-07 ENCOUNTER — Other Ambulatory Visit: Payer: Self-pay | Admitting: Gastroenterology

## 2015-05-07 ENCOUNTER — Ambulatory Visit (HOSPITAL_COMMUNITY)
Admission: RE | Admit: 2015-05-07 | Discharge: 2015-05-07 | Disposition: A | Discharge status: Home / Self Care | Payer: Medicare Other | Source: Ambulatory Visit | Attending: Gastroenterology | Admitting: Gastroenterology

## 2015-05-07 DIAGNOSIS — Z9049 Acquired absence of other specified parts of digestive tract: Secondary | ICD-10-CM | POA: Insufficient documentation

## 2015-05-07 DIAGNOSIS — R935 Abnormal findings on diagnostic imaging of other abdominal regions, including retroperitoneum: Secondary | ICD-10-CM | POA: Insufficient documentation

## 2015-05-07 DIAGNOSIS — Z85038 Personal history of other malignant neoplasm of large intestine: Secondary | ICD-10-CM | POA: Insufficient documentation

## 2015-05-07 DIAGNOSIS — K862 Cyst of pancreas: Secondary | ICD-10-CM | POA: Insufficient documentation

## 2015-05-07 DIAGNOSIS — K869 Disease of pancreas, unspecified: Secondary | ICD-10-CM | POA: Diagnosis present

## 2015-05-07 MED ORDER — GADOBENATE DIMEGLUMINE 529 MG/ML IV SOLN
20.0000 mL | Freq: Once | INTRAVENOUS | Status: AC | PRN
  Administered 2015-05-07: 20 mL via INTRAVENOUS

## 2015-05-10 ENCOUNTER — Telehealth: Payer: Self-pay | Admitting: Gastroenterology

## 2015-05-10 NOTE — Telephone Encounter (Signed)
Spoke with patient and she is much better today. She wants to see how she is tomorrow. She will call with update and if not better will do labs and have patient seen.

## 2015-05-10 NOTE — Telephone Encounter (Signed)
Sorry to hear this. Is she feeling better today? If she still has symptoms we can send CBC, LFTs, and lipase to ensure normal. I would wait and see how she feels tomorrow. If she feels poorly and does not think she can go through a prep we can reschedule. However if she is improving and feels well tomorrow, we can proceed as scheduled. If she continues to feel bad, would recommend labs and see if we can get her into clinic. Thanks

## 2015-05-10 NOTE — Telephone Encounter (Signed)
Spoke with patient and last night, she had severe abdominal pain above the belly button on right side. States abdomen was hard to touch and tender. (almost like appendicitis but I don't have appendix) She had been taking Tramadol for knee pain and had only had a small bowel movement yesterday. She did report nausea without vomiting last night.  No fever.She used heat and drank liquids. She reports today, she has passed loose stool and gas. She denies bleeding. States it feels more like muscular pain today but not last night. She is scheduled for a colonoscopy on 05/12/15 and has concerns about having it now. Please, advise.

## 2015-05-11 NOTE — Telephone Encounter (Signed)
Spoke with patient and she states she was able to eat last night and this AM. She states she thinks she is feeling some better.

## 2015-05-11 NOTE — Telephone Encounter (Signed)
Okay if she can tolerate it, would recommend proceeding with her colonoscopy. If she still doesn't feel well enough to do it, can reschedule but hopefully she has improved enough to where she can tolerate prep. Thanks

## 2015-05-12 ENCOUNTER — Encounter: Payer: Self-pay | Admitting: Gastroenterology

## 2015-05-12 ENCOUNTER — Ambulatory Visit (AMBULATORY_SURGERY_CENTER): Payer: Medicare Other | Admitting: Gastroenterology

## 2015-05-12 VITALS — BP 139/76 | HR 89 | Temp 99.4°F | Resp 18 | Ht 64.0 in | Wt 215.0 lb

## 2015-05-12 DIAGNOSIS — Z85038 Personal history of other malignant neoplasm of large intestine: Secondary | ICD-10-CM | POA: Diagnosis not present

## 2015-05-12 DIAGNOSIS — D123 Benign neoplasm of transverse colon: Secondary | ICD-10-CM

## 2015-05-12 MED ORDER — SODIUM CHLORIDE 0.9 % IV SOLN: 500.0000 mL | INTRAVENOUS | Status: DC

## 2015-05-12 NOTE — Patient Instructions (Signed)
YOU HAD AN ENDOSCOPIC PROCEDURE TODAY AT [facility]:   Refer to the procedure report that was given to you for any specific questions about what was found during the examination.  If the procedure report does not answer your questions, please call your gastroenterologist to clarify.  If you requested that your care partner not be given the details of your procedure findings, then the procedure report has been included in a sealed envelope for you to review at your convenience later.  YOU SHOULD EXPECT: Some feelings of bloating in the abdomen. Passage of more gas than usual.  Walking can help get rid of the air that was put into your GI tract during the procedure and reduce the bloating. If you had a lower endoscopy (such as a colonoscopy or flexible sigmoidoscopy) you may notice spotting of blood in your stool or on the toilet paper. If you underwent a bowel prep for your procedure, you may not have a normal bowel movement for a few days.  Please Note:  You might notice some irritation and congestion in your nose or some drainage.  This is from the oxygen used during your procedure.  There is no need for concern and it should clear up in a day or so.  SYMPTOMS TO REPORT IMMEDIATELY:   Following lower endoscopy (colonoscopy or flexible sigmoidoscopy):  Excessive amounts of blood in the stool  Significant tenderness or worsening of abdominal pains  Swelling of the abdomen that is new, acute  Fever of 100F or higher   For urgent or emergent issues, a gastroenterologist can be reached at any hour by calling [phone].   DIET: Your first meal following the procedure should be a small meal and then it is ok to progress to your normal diet. Heavy or fried foods are harder to digest and may make you feel nauseous or bloated.  Likewise, meals heavy in dairy and vegetables can increase bloating.  Drink plenty of fluids but you should avoid alcoholic beverages for 24  hours.  ACTIVITY:  You should plan to take it easy for the rest of today and you should NOT DRIVE or use heavy machinery until tomorrow (because of the sedation medicines used during the test).    FOLLOW UP: Our staff will call the number listed on your records the next business day following your procedure to check on you and address any questions or concerns that you may have regarding the information given to you following your procedure. If we do not reach you, we will leave a message.  However, if you are feeling well and you are not experiencing any problems, there is no need to return our call.  We will assume that you have returned to your regular daily activities without incident.  Polyp and hemorrhoid information given.  If any biopsies were taken you will be contacted by phone or by letter within the next 1-3 weeks.  Please call us at [phone] if you have not heard about the biopsies in 3 weeks.    SIGNATURES/CONFIDENTIALITY: You and/or your care partner have signed paperwork which will be entered into your electronic medical record.  These signatures attest to the fact that that the information above on your After Visit Summary has been reviewed and is understood.  Full responsibility of the confidentiality of this discharge information lies with you and/or your care-partner.

## 2015-05-12 NOTE — Progress Notes (Signed)
Called to room to assist during endoscopic procedure.  Patient ID and intended procedure confirmed with present staff. Received instructions for my participation in the procedure from the performing physician.  

## 2015-05-12 NOTE — Progress Notes (Signed)
Stable to RR 

## 2015-05-12 NOTE — Op Note (Signed)
[place]  [address] [place], [id number]   COLONOSCOPY PROCEDURE REPORT  PATIENT: Ann Maxwell, Ann Maxwell  MR#: [id number] BIRTHDATE: 04-03-1941 , 75  yrs. old GENDER: female ENDOSCOPIST: Yetta Flock, MD REFERRED BY: Leanna Battles MD PROCEDURE DATE:  05/12/2015 PROCEDURE:   Colonoscopy, surveillance and Colonoscopy with biopsy First Screening Colonoscopy - Avg.  risk and is 50 yrs.  old or older - No.  Prior Negative Screening - Now for repeat screening. N/A  History of Adenoma - Now for follow-up colonoscopy & has been > or = to 3 yrs.  N/A  Polyps removed today? Yes ASA CLASS:   Class III INDICATIONS:Surveillance due to prior colonic neoplasia and PH Colon or Rectal Adenocarcinoma with surgical resection one year ago. MEDICATIONS: Propofol 400 mg IV and Lidocaine 40 mg IV  DESCRIPTION OF PROCEDURE:   After the risks benefits and alternatives of the procedure were thoroughly explained, informed consent was obtained.  The digital rectal exam revealed no abnormalities of the rectum.   The [id number]  endoscope was introduced through the anus and advanced to the surgical anastomosis. No adverse events experienced.   The quality of the prep was adequate  The instrument was then slowly withdrawn as the colon was fully examined. Estimated blood loss is zero unless otherwise noted in this procedure report.    COLON FINDINGS: A normal appearing surgical anastomosis was appreciated in the right colon.  A 60mm sessile polyp was noted in the transverse colon and removed with cold forceps.  The remainder of the examined colon was normal without other polyps or mass lesions.  Retroflexed views revealed internal hemorrhoids. The time to cecum = 4.8 Withdrawal time = 17.1   The scope was withdrawn and the procedure completed. COMPLICATIONS: There were no immediate complications.  ENDOSCOPIC IMPRESSION: Healthy surgical anastomosis Small transverse colon  polyp, removed Internal hemorrhoids  RECOMMENDATIONS: Await pathology results Resume diet Resume medications  eSigned:  Yetta Flock, MD 05/12/2015 3:04 PM   cc:  Leanna Battles MD, the patient

## 2015-05-13 ENCOUNTER — Telehealth: Payer: Self-pay | Admitting: *Deleted

## 2015-05-13 NOTE — Telephone Encounter (Signed)
  Follow up Call-  Call back number 05/12/2015 03/20/2014 01/28/2014  Post procedure Call Back phone  # [phone]  Permission to leave phone message Yes Yes Yes     Patient questions:  Do you have a fever, pain , or abdominal swelling? No. Pain Score  0 *  Have you tolerated food without any problems? Yes.    Have you been able to return to your normal activities? Yes.    Do you have any questions about your discharge instructions: Diet   No. Medications  No. Follow up visit  No.  Do you have questions or concerns about your Care? No.  Actions: * If pain score is 4 or above: No action needed, pain <4.

## 2015-05-18 ENCOUNTER — Other Ambulatory Visit: Payer: Medicare PPO

## 2015-05-19 ENCOUNTER — Encounter: Payer: Self-pay | Admitting: Gastroenterology

## 2015-05-24 ENCOUNTER — Other Ambulatory Visit: Payer: Self-pay | Admitting: *Deleted

## 2015-05-24 ENCOUNTER — Other Ambulatory Visit (HOSPITAL_BASED_OUTPATIENT_CLINIC_OR_DEPARTMENT_OTHER): Payer: Medicare Other

## 2015-05-24 ENCOUNTER — Telehealth: Payer: Self-pay | Admitting: Hematology and Oncology

## 2015-05-24 ENCOUNTER — Telehealth: Payer: Self-pay | Admitting: *Deleted

## 2015-05-24 DIAGNOSIS — C50512 Malignant neoplasm of lower-outer quadrant of left female breast: Secondary | ICD-10-CM

## 2015-05-24 DIAGNOSIS — C180 Malignant neoplasm of cecum: Secondary | ICD-10-CM | POA: Diagnosis not present

## 2015-05-24 LAB — URINALYSIS, MICROSCOPIC - CHCC
BILIRUBIN (URINE): NEGATIVE
GLUCOSE UR CHCC: NEGATIVE mg/dL
Ketones: NEGATIVE mg/dL
LEUKOCYTE ESTERASE: NEGATIVE
NITRITE: NEGATIVE
PH: 5 (ref 4.6–8.0)
Protein: NEGATIVE mg/dL
Specific Gravity, Urine: 1.01 (ref 1.003–1.035)
Urobilinogen, UR: 0.2 mg/dL (ref 0.2–1)

## 2015-05-24 NOTE — Telephone Encounter (Signed)
per pof to sch pt appt-cld & spoke to pt and adv of time of lab appt today

## 2015-05-24 NOTE — Telephone Encounter (Signed)
Sent pof to schedule lab appt for today and let patient know that she can come in to do this.

## 2015-05-24 NOTE — Telephone Encounter (Signed)
"  I am scheduled to see Dr. Lindi Adie tomorrow.  Can I come in early to give a urine sample.  Yesterday I started having bladder spasms and was awakened last night with spasms.  I have burning with urination, frequency and urgency.  One and a half weeks ago Dr. Zachery Conch checked a UA as CT scan showed I might have an infection but nothing was found.  NOW, I'm having symptoms.  I'm forcing water and cranberry juice."  Return number [phone].  Willing to come today if  Needed.

## 2015-05-25 ENCOUNTER — Telehealth: Payer: Self-pay | Admitting: Hematology and Oncology

## 2015-05-25 ENCOUNTER — Ambulatory Visit (HOSPITAL_BASED_OUTPATIENT_CLINIC_OR_DEPARTMENT_OTHER): Payer: Medicare Other | Admitting: Hematology and Oncology

## 2015-05-25 ENCOUNTER — Encounter: Payer: Self-pay | Admitting: Hematology and Oncology

## 2015-05-25 VITALS — BP 141/68 | HR 103 | Temp 98.3°F | Resp 19 | Ht 64.0 in | Wt 211.7 lb

## 2015-05-25 DIAGNOSIS — Z85038 Personal history of other malignant neoplasm of large intestine: Secondary | ICD-10-CM | POA: Diagnosis not present

## 2015-05-25 DIAGNOSIS — C50512 Malignant neoplasm of lower-outer quadrant of left female breast: Secondary | ICD-10-CM

## 2015-05-25 DIAGNOSIS — Z79811 Long term (current) use of aromatase inhibitors: Secondary | ICD-10-CM | POA: Diagnosis not present

## 2015-05-25 DIAGNOSIS — Z170 Estrogen receptor positive status [ER+]: Secondary | ICD-10-CM | POA: Diagnosis not present

## 2015-05-25 DIAGNOSIS — C189 Malignant neoplasm of colon, unspecified: Secondary | ICD-10-CM

## 2015-05-25 MED ORDER — ANASTROZOLE 1 MG PO TABS: 1.0000 mg | ORAL_TABLET | Freq: Every day | ORAL | Status: DC

## 2015-05-25 NOTE — Telephone Encounter (Signed)
appointments made per 2/7 pof and avs printed. CT of abd/pelvis to be scheduled in 1 year. Patient will get contrast at that time

## 2015-05-25 NOTE — Progress Notes (Signed)
Patient Care Team: Ann Battles, MD as PCP - General (Internal Medicine)  DIAGNOSIS: Primary cancer of lower outer quadrant of left female breast [facility])   Staging form: Breast, AJCC 7th Edition     Clinical stage from 12/20/2011: Stage 0 (Tis, N0, cM0) - Unsigned     Pathologic: No stage assigned - Unsigned Primary colon cancer (Ann Maxwell)   Staging form: Colon and Rectum, AJCC 7th Edition     Pathologic: Stage IIA (T3, N0, cM0) - Signed by Rulon Eisenmenger, MD on 06/15/2014  SUMMARY OF ONCOLOGIC HISTORY:   Primary cancer of lower outer quadrant of left female breast (Ann Maxwell)   01/04/2012 Surgery Left mastectomy: Invasive ductal carcinoma 0.5 cm with high-grade DCIS 9 cm, margins negative, 0/6 lymph nodes, ER 100%, PR 100%, Ki-67 17%, HER-2 negative ratio 1.16   02/07/2012 -  Anti-estrogen oral therapy Anastrozole 1 mg daily    Primary colon cancer (Ann Maxwell)   03/20/2014 Initial Diagnosis Invasive adenocarcinoma of Caecum   04/28/2014 Surgery Right hemicolectomy: Invasive adenocarcinoma with mucinous features moderately differentiated invading subserosal tissue, margins negative, 25 lymph nodes negative, T3 N0 M0 stage II a, no microsatellite instability   CHIEF COMPLIANT:  Follow up of breast cancer and colon cancer  INTERVAL HISTORY: Ann Maxwell is a  75 year old with above-mentioned history of left breast cancer treated with mastectomy and is currently on anastrozole therapy. She does have occasional hot flashes but able to tolerate it quite well. Denies any myalgias. She is been on this for the past 3-1/2 years. She has one one half year more left to go on this. Denies any lumps or nodules in the breasts. Her colon cancer was diagnosed in December 2015 and she underwent right hemicolectomy. Because she was stage II disease, she did not need adjuvant chemotherapy. She had a CEA which was normal. She had a CT of the abdomen and pelvis which did not show any evidence of colon cancer but did show  pancreatic cyst. She underwent MRCP which was normal and felt that the cyst could be benign in nature.  REVIEW OF SYSTEMS:   Constitutional: Denies fevers, chills or abnormal weight loss Eyes: Denies blurriness of vision Ears, nose, mouth, throat, and face: Denies mucositis or sore throat Respiratory: Denies cough, dyspnea or wheezes Cardiovascular: Denies palpitation, chest discomfort Gastrointestinal:  Denies nausea, heartburn or change in bowel habits Skin: Denies abnormal skin rashes Lymphatics: Denies new lymphadenopathy or easy bruising Neurological:Denies numbness, tingling or new weaknesses Behavioral/Psych: Mood is stable, no new changes  Extremities: No lower extremity edema Breast:  denies any pain or lumps or nodules in either breasts All other systems were reviewed with the patient and are negative.  I have reviewed the past medical history, past surgical history, social history and family history with the patient and they are unchanged from previous note.  ALLERGIES:  is allergic to acyclovir and related; hydrocodone; adhesive; and terramycin.  MEDICATIONS:  Current Outpatient Prescriptions  Medication Sig Dispense Refill  . acetaminophen (TYLENOL) 325 MG tablet Take 650 mg by mouth at bedtime as needed. Reported on 05/12/2015    . ADVAIR DISKUS 250-50 MCG/DOSE AEPB Inhale 1 puff into the lungs 2 (two) times daily. 180 each 0  . albuterol (PROVENTIL HFA;VENTOLIN HFA) 108 (90 BASE) MCG/ACT inhaler Inhale 2 puffs into the lungs every 4 (four) hours as needed for wheezing or shortness of breath.    . anastrozole (ARIMIDEX) 1 MG tablet Take 1 tablet (1 mg total) by mouth daily.  90 tablet 3  . ANUSOL-HC 2.5 % rectal cream APPLY rectally 3 TIMES DAILY AS NEEDED  2  . aspirin 81 MG tablet Take 81 mg by mouth daily.    . celecoxib (CELEBREX) 200 MG capsule TAKE 1 CAPSULE BY MOUTH 2 TIMES DAILY 30 capsule 1  . cyclobenzaprine (FLEXERIL) 5 MG tablet Take 5 mg by mouth 3 (three)  times daily.  0  . ipratropium-albuterol (DUONEB) 0.5-2.5 (3) MG/3ML SOLN Take 3 mLs by nebulization every 6 (six) hours as needed. Reported on 05/12/2015    . loratadine (CLARITIN) 10 MG tablet Take 10 mg by mouth daily as needed for allergies.    Marland Kitchen losartan-hydrochlorothiazide (HYZAAR) 50-12.5 MG per tablet Take 1 tablet by mouth daily.    . montelukast (SINGULAIR) 10 MG tablet Take 10 mg by mouth at bedtime.    . traMADol (ULTRAM) 50 MG tablet TAKE 1 to 2 TABLETS BY MOUTH EVERY 6 TO 8 HOURS AS NEEDED FOR PAIN  0   No current facility-administered medications for this visit.    PHYSICAL EXAMINATION: ECOG PERFORMANCE STATUS: 0 - Asymptomatic  Filed Vitals:   05/25/15 0924  BP: 141/68  Pulse: 103  Temp: 98.3 F (36.8 C)  Resp: 19   Filed Weights   05/25/15 0924  Weight: 211 lb 11.2 oz (96.026 kg)    GENERAL:alert, no distress and comfortable SKIN: skin color, texture, turgor are normal, no rashes or significant lesions EYES: normal, Conjunctiva are pink and non-injected, sclera clear OROPHARYNX:no exudate, no erythema and lips, buccal mucosa, and tongue normal  NECK: supple, thyroid normal size, non-tender, without nodularity LYMPH:  no palpable lymphadenopathy in the cervical, axillary or inguinal LUNGS: clear to auscultation and percussion with normal breathing effort HEART: regular rate & rhythm and no murmurs and no lower extremity edema ABDOMEN:abdomen soft, non-tender and normal bowel sounds MUSCULOSKELETAL:no cyanosis of digits and no clubbing  NEURO: alert & oriented x 3 with fluent speech, no focal motor/sensory deficits EXTREMITIES: No lower extremity edema BREAST: No palpable masses or nodules in either right or left breasts. No palpable axillary supraclavicular or infraclavicular adenopathy no breast tenderness or nipple discharge. (exam performed in the presence of a chaperone)  LABORATORY DATA:  I have reviewed the data as listed   Chemistry      Component  Value Date/Time   NA 140 04/28/2015 1126   NA 140 05/02/2014 0553   K 3.6 04/28/2015 1126   K 3.4* 05/02/2014 0553   CL 102 05/02/2014 0553   CL 102 09/23/2012 1028   CO2 28 04/28/2015 1126   CO2 30 05/02/2014 0553   BUN 13.4 04/28/2015 1126   BUN 6 05/02/2014 0553   CREATININE 0.8 04/28/2015 1126   CREATININE 0.56 05/02/2014 0553      Component Value Date/Time   CALCIUM 10.0 04/28/2015 1126   CALCIUM 8.9 05/02/2014 0553   ALKPHOS 101 04/28/2015 1126   ALKPHOS 96 04/23/2014 1115   AST 19 04/28/2015 1126   AST 15 04/23/2014 1115   ALT 23 04/28/2015 1126   ALT 12 04/23/2014 1115   BILITOT 0.59 04/28/2015 1126   BILITOT 0.6 04/23/2014 1115       Lab Results  Component Value Date   WBC 8.9 04/28/2015   HGB 15.4 04/28/2015   HCT 47.6* 04/28/2015   MCV 87.2 04/28/2015   PLT 244 04/28/2015   NEUTROABS 7.1* 04/28/2015   ASSESSMENT & PLAN:  Primary cancer of lower outer quadrant of left female breast Left breast  cancer invasive ductal carcinoma: 0.5 cm with 9 mm area of DCIS status post left mastectomy 01/04/2012 ER/PR positive HER-2 negative Ki-67 17% currently on Arimidex 1 mg daily  Since 02/07/2012.  Arimidex toxicities: 1. Hot flashes: She takes gabapentin for hot flashes. 2. Arthritis in her knees: Patient takes Celebrex. I discussed with her that Celebrex may have protective benefits for colon cancer. However she needs to discuss with her family physician about her underlying cardiac risks for long-term Celebrex therapy. She will take it currently on an as-needed basis.  Breast cancer surveillance: 1. Breast exam  05/25/2015 is normal 2. Mammogram 02/01/2015 is normal  Breast density category C  Return to clinic in 6 months for follow-up  Primary colon cancer Cecal adenocarcinoma diagnosed 04/22/2013, preop CEA of 5.1, status post right hemicolectomy 04/28/2014, grade 2, no MVI, 0/25 lymph nodes, subserosal involvement, T3 N0 M0 stage II A, no mismatch  repair  Current treatment: Surveillance with CEA  Colonoscopy and CT scan in one year in January 2018:  CT scan January 2017 revealed pancreatic cysts which were further evaluated by abdominal MRI/MRCP with 3-D reconstruction and it suggested that these are benign cysts the pancreas Anemia has resolved  CEA is 1.8: Normal   No orders of the defined types were placed in this encounter.   The patient has a good understanding of the overall plan. she agrees with it. she will call with any problems that may develop before the next visit here.   Rulon Eisenmenger, MD 05/25/2015

## 2015-05-25 NOTE — Assessment & Plan Note (Signed)
Left breast cancer invasive ductal carcinoma: 0.5 cm with 9 mm area of DCIS status post left mastectomy 01/04/2012 ER/PR positive HER-2 negative Ki-67 17% currently on Arimidex 1 mg daily  Since 02/07/2012.  Arimidex toxicities: 1. Hot flashes: She takes gabapentin for hot flashes. 2. Arthritis in her knees: Patient takes Celebrex. I discussed with her that Celebrex may have protective benefits for colon cancer. However she needs to discuss with her family physician about her underlying cardiac risks for long-term Celebrex therapy. She will take it currently on an as-needed basis.  Breast cancer surveillance: 1. Breast exam  05/25/2015 is normal 2. Mammogram 02/01/2015 is normal  Breast density category C  Return to clinic in 6 months for follow-up   Primary colon cancer Cecal adenocarcinoma diagnosed 04/22/2013, preop CEA of 5.1, status post right hemicolectomy 04/28/2014, grade 2, no MVI, 0/25 lymph nodes, subserosal involvement, T3 N0 M0 stage II A, no mismatch repair  Current treatment: Surveillance with CEA every 3 months Colonoscopy and CT scan in one year in January 2018:  CT scan January 2017 revealed pancreatic cysts which were further evaluated by abdominal MRI/MRCP with 3-D reconstruction and it suggested that these are benign cysts the pancreas Anemia has resolved  CEA is pending

## 2015-05-26 LAB — URINE CULTURE

## 2015-08-03 NOTE — Progress Notes (Signed)
Dr. Sharlett Iles sent Pre-op clearance note to Dr. Wynelle Link

## 2015-08-10 ENCOUNTER — Ambulatory Visit: Payer: Self-pay | Admitting: Orthopedic Surgery

## 2015-08-10 NOTE — Patient Instructions (Signed)
DOYNE BRECEDA  08/10/2015   Your procedure is scheduled on:   08/23/2015    Report to [facility]  Entrance take [place]  elevators to 3rd floor to  [place] at   0830 AM.  Call this number if you have problems the morning of surgery [phone]   Remember: ONLY 1 PERSON MAY GO WITH YOU TO SHORT STAY TO GET  READY MORNING OF [place].  Do not eat food or drink liquids :After Midnight.     Take these medicines the morning of surgery with A SIP OF WATER:  Advair and bring, Albuterol Inhaler if needed and bring, duoneb if needed, Claritin if needed, Eyedrops if needed                                 You may not have any metal on your body including hair pins and              piercings  Do not wear jewelry, make-up, lotions, powders or perfumes, deodorant             Do not wear nail polish.  Do not shave  48 hours prior to surgery.     Do not bring valuables to the hospital. [place].  Contacts, dentures or bridgework may not be worn into surgery.  Leave suitcase in the car. After surgery it may be brought to your room.       Special Instructions: coughing and deep breathing exercises, leg exercises               Please read over the following fact sheets you were given: _____________________________________________________________________             [facility] - Preparing for Surgery Before surgery, you can play an important role.  Because skin is not sterile, your skin needs to be as free of germs as possible.  You can reduce the number of germs on your skin by washing with CHG (chlorahexidine gluconate) soap before surgery.  CHG is an antiseptic cleaner which kills germs and bonds with the skin to continue killing germs even after washing. Please DO NOT use if you have an allergy to CHG or antibacterial soaps.  If your skin becomes reddened/irritated stop using the CHG and inform your  nurse when you arrive at Short Stay. Do not shave (including legs and underarms) for at least 48 hours prior to the first CHG shower.  You may shave your face/neck. Please follow these instructions carefully:  1.  Shower with CHG Soap the night before surgery and the  morning of Surgery.  2.  If you choose to wash your hair, wash your hair first as usual with your  normal  shampoo.  3.  After you shampoo, rinse your hair and body thoroughly to remove the  shampoo.                           4.  Use CHG as you would any other liquid soap.  You can apply chg directly  to the skin and wash  Gently with a scrungie or clean washcloth.  5.  Apply the CHG Soap to your body ONLY FROM THE NECK DOWN.   Do not use on face/ open                           Wound or open sores. Avoid contact with eyes, ears mouth and genitals (private parts).                       Wash face,  Genitals (private parts) with your normal soap.             6.  Wash thoroughly, paying special attention to the area where your surgery  will be performed.  7.  Thoroughly rinse your body with warm water from the neck down.  8.  DO NOT shower/wash with your normal soap after using and rinsing off  the CHG Soap.                9.  Pat yourself dry with a clean towel.            10.  Wear clean pajamas.            11.  Place clean sheets on your bed the night of your first shower and do not  sleep with pets. Day of Surgery : Do not apply any lotions/deodorants the morning of surgery.  Please wear clean clothes to the hospital/surgery center.  FAILURE TO FOLLOW THESE INSTRUCTIONS MAY RESULT IN THE CANCELLATION OF YOUR SURGERY PATIENT SIGNATURE_________________________________  NURSE SIGNATURE__________________________________  ________________________________________________________________________  WHAT IS A BLOOD TRANSFUSION? Blood Transfusion Information  A transfusion is the replacement of blood or some of its  parts. Blood is made up of multiple cells which provide different functions.  Red blood cells carry oxygen and are used for blood loss replacement.  White blood cells fight against infection.  Platelets control bleeding.  Plasma helps clot blood.  Other blood products are available for specialized needs, such as hemophilia or other clotting disorders. BEFORE THE TRANSFUSION  Who gives blood for transfusions?   Healthy volunteers who are fully evaluated to make sure their blood is safe. This is blood bank blood. Transfusion therapy is the safest it has ever been in the practice of medicine. Before blood is taken from a donor, a complete history is taken to make sure that person has no history of diseases nor engages in risky social behavior (examples are intravenous drug use or sexual activity with multiple partners). The donor's travel history is screened to minimize risk of transmitting infections, such as malaria. The donated blood is tested for signs of infectious diseases, such as HIV and hepatitis. The blood is then tested to be sure it is compatible with you in order to minimize the chance of a transfusion reaction. If you or a relative donates blood, this is often done in anticipation of surgery and is not appropriate for emergency situations. It takes many days to process the donated blood. RISKS AND COMPLICATIONS Although transfusion therapy is very safe and saves many lives, the main dangers of transfusion include:  1. Getting an infectious disease. 2. Developing a transfusion reaction. This is an allergic reaction to something in the blood you were given. Every precaution is taken to prevent this. The decision to have a blood transfusion has been considered carefully by your caregiver before blood is given. Blood is not given unless the benefits outweigh  the risks. AFTER THE TRANSFUSION  Right after receiving a blood transfusion, you will usually feel much better and more energetic.  This is especially true if your red blood cells have gotten low (anemic). The transfusion raises the level of the red blood cells which carry oxygen, and this usually causes an energy increase.  The nurse administering the transfusion will monitor you carefully for complications. HOME CARE INSTRUCTIONS  No special instructions are needed after a transfusion. You may find your energy is better. Speak with your caregiver about any limitations on activity for underlying diseases you may have. SEEK MEDICAL CARE IF:   Your condition is not improving after your transfusion.  You develop redness or irritation at the intravenous (IV) site. SEEK IMMEDIATE MEDICAL CARE IF:  Any of the following symptoms occur over the next 12 hours:  Shaking chills.  You have a temperature by mouth above 102 F (38.9 C), not controlled by medicine.  Chest, back, or muscle pain.  People around you feel you are not acting correctly or are confused.  Shortness of breath or difficulty breathing.  Dizziness and fainting.  You get a rash or develop hives.  You have a decrease in urine output.  Your urine turns a dark color or changes to pink, red, or brown. Any of the following symptoms occur over the next 10 days:  You have a temperature by mouth above 102 F (38.9 C), not controlled by medicine.  Shortness of breath.  Weakness after normal activity.  The white part of the eye turns yellow (jaundice).  You have a decrease in the amount of urine or are urinating less often.  Your urine turns a dark color or changes to pink, red, or brown. Document Released: 03/31/2000 Document Revised: 06/26/2011 Document Reviewed: 11/18/2007 ExitCare Patient Information 2014 [place].  _______________________________________________________________________  Incentive Spirometer  An incentive spirometer is a tool that can help keep your lungs clear and active. This tool measures how well you are filling  your lungs with each breath. Taking long deep breaths may help reverse or decrease the chance of developing breathing (pulmonary) problems (especially infection) following:  A long period of time when you are unable to move or be active. BEFORE THE PROCEDURE   If the spirometer includes an indicator to show your best effort, your nurse or respiratory therapist will set it to a desired goal.  If possible, sit up straight or lean slightly forward. Try not to slouch.  Hold the incentive spirometer in an upright position. INSTRUCTIONS FOR USE  3. Sit on the edge of your bed if possible, or sit up as far as you can in bed or on a chair. 4. Hold the incentive spirometer in an upright position. 5. Breathe out normally. 6. Place the mouthpiece in your mouth and seal your lips tightly around it. 7. Breathe in slowly and as deeply as possible, raising the piston or the ball toward the top of the column. 8. Hold your breath for 3-5 seconds or for as long as possible. Allow the piston or ball to fall to the bottom of the column. 9. Remove the mouthpiece from your mouth and breathe out normally. 10. Rest for a few seconds and repeat Steps 1 through 7 at least 10 times every 1-2 hours when you are awake. Take your time and take a few normal breaths between deep breaths. 11. The spirometer may include an indicator to show your best effort. Use the indicator as a goal to work  toward during each repetition. 12. After each set of 10 deep breaths, practice coughing to be sure your lungs are clear. If you have an incision (the cut made at the time of surgery), support your incision when coughing by placing a pillow or rolled up towels firmly against it. Once you are able to get out of bed, walk around indoors and cough well. You may stop using the incentive spirometer when instructed by your caregiver.  RISKS AND COMPLICATIONS  Take your time so you do not get dizzy or light-headed.  If you are in pain, you  may need to take or ask for pain medication before doing incentive spirometry. It is harder to take a deep breath if you are having pain. AFTER USE  Rest and breathe slowly and easily.  It can be helpful to keep track of a log of your progress. Your caregiver can provide you with a simple table to help with this. If you are using the spirometer at home, follow these instructions: [place] IF:   You are having difficultly using the spirometer.  You have trouble using the spirometer as often as instructed.  Your pain medication is not giving enough relief while using the spirometer.  You develop fever of 100.5 F (38.1 C) or higher. SEEK IMMEDIATE MEDICAL CARE IF:   You cough up bloody sputum that had not been present before.  You develop fever of 102 F (38.9 C) or greater.  You develop worsening pain at or near the incision site. MAKE SURE YOU:   Understand these instructions.  Will watch your condition.  Will get help right away if you are not doing well or get worse. Document Released: 08/14/2006 Document Revised: 06/26/2011 Document Reviewed: 10/15/2006 [facility] Patient Information 2014 [place], [place].   ________________________________________________________________________

## 2015-08-10 NOTE — Progress Notes (Signed)
Preoperative surgical orders have been place into the Epic hospital system for Ann Maxwell on 08/10/2015, 10:06 AM  by Mickel Crow for surgery on 08-23-2015.  Preop Total Knee orders including Experal, IV Tylenol, and IV Decadron as long as there are no contraindications to the above medications. [name], PA-C

## 2015-08-11 ENCOUNTER — Encounter (HOSPITAL_COMMUNITY)
Admission: RE | Admit: 2015-08-11 | Discharge: 2015-08-11 | Disposition: A | Discharge status: Home / Self Care | Payer: Medicare Other | Source: Ambulatory Visit | Attending: Orthopedic Surgery | Admitting: Orthopedic Surgery

## 2015-08-11 ENCOUNTER — Encounter (HOSPITAL_COMMUNITY): Payer: Self-pay

## 2015-08-11 DIAGNOSIS — M1712 Unilateral primary osteoarthritis, left knee: Secondary | ICD-10-CM | POA: Insufficient documentation

## 2015-08-11 DIAGNOSIS — I440 Atrioventricular block, first degree: Secondary | ICD-10-CM | POA: Diagnosis not present

## 2015-08-11 DIAGNOSIS — R000 Tachycardia, unspecified: Secondary | ICD-10-CM | POA: Insufficient documentation

## 2015-08-11 DIAGNOSIS — Z01818 Encounter for other preprocedural examination: Secondary | ICD-10-CM | POA: Insufficient documentation

## 2015-08-11 DIAGNOSIS — R9431 Abnormal electrocardiogram [ECG] [EKG]: Secondary | ICD-10-CM | POA: Insufficient documentation

## 2015-08-11 DIAGNOSIS — I10 Essential (primary) hypertension: Secondary | ICD-10-CM | POA: Insufficient documentation

## 2015-08-11 DIAGNOSIS — Z0183 Encounter for blood typing: Secondary | ICD-10-CM | POA: Insufficient documentation

## 2015-08-11 DIAGNOSIS — Z01812 Encounter for preprocedural laboratory examination: Secondary | ICD-10-CM | POA: Diagnosis not present

## 2015-08-11 LAB — COMPREHENSIVE METABOLIC PANEL
ALT: 22 U/L (ref 14–54)
AST: 29 U/L (ref 15–41)
Albumin: 4.3 g/dL (ref 3.5–5.0)
Alkaline Phosphatase: 94 U/L (ref 38–126)
Anion gap: 12 (ref 5–15)
BILIRUBIN TOTAL: 0.3 mg/dL (ref 0.3–1.2)
BUN: 10 mg/dL (ref 6–20)
CO2: 27 mmol/L (ref 22–32)
CREATININE: 0.8 mg/dL (ref 0.44–1.00)
Calcium: 9.7 mg/dL (ref 8.9–10.3)
Chloride: 101 mmol/L (ref 101–111)
GFR calc Af Amer: >60 mL/min (ref >60)
Glucose, Bld: 114 mg/dL — ABNORMAL HIGH (ref 65–99)
POTASSIUM: 4.6 mmol/L (ref 3.5–5.1)
Sodium: 140 mmol/L (ref 135–145)
TOTAL PROTEIN: 7.7 g/dL (ref 6.5–8.1)

## 2015-08-11 LAB — CBC
HEMATOCRIT: 47.8 % — AB (ref 36.0–46.0)
Hemoglobin: 15.7 g/dL — ABNORMAL HIGH (ref 12.0–15.0)
MCH: 29.1 pg (ref 26.0–34.0)
MCHC: 32.8 g/dL (ref 30.0–36.0)
MCV: 88.5 fL (ref 78.0–100.0)
Platelets: 278 10*3/uL (ref 150–400)
RBC: 5.4 MIL/uL — ABNORMAL HIGH (ref 3.87–5.11)
RDW: 13.7 % (ref 11.5–15.5)
WBC: 18.8 10*3/uL — AB (ref 4.0–10.5)

## 2015-08-11 LAB — URINALYSIS, ROUTINE W REFLEX MICROSCOPIC
BILIRUBIN URINE: NEGATIVE
Glucose, UA: NEGATIVE mg/dL
Ketones, ur: NEGATIVE mg/dL
LEUKOCYTES UA: NEGATIVE
NITRITE: POSITIVE — AB
Protein, ur: NEGATIVE mg/dL
SPECIFIC GRAVITY, URINE: 1.009 (ref 1.005–1.030)
pH: 5 (ref 5.0–8.0)

## 2015-08-11 LAB — TYPE AND SCREEN
ABO/RH(D): O POS
Antibody Screen: POSITIVE
DAT, IgG: NEGATIVE

## 2015-08-11 LAB — APTT: aPTT: 27 seconds (ref 24–37)

## 2015-08-11 LAB — URINE MICROSCOPIC-ADD ON
RBC / HPF: NONE SEEN RBC/hpf (ref 0–5)
SQUAMOUS EPITHELIAL / LPF: NONE SEEN

## 2015-08-11 LAB — PROTIME-INR
INR: 1.09 (ref 0.00–1.49)
PROTHROMBIN TIME: 14.3 s (ref 11.6–15.2)

## 2015-08-11 NOTE — Patient Instructions (Signed)
JENNINE CICCHINO  08/11/2015   Your procedure is scheduled on: Monday 08/23/2015  Report to [facility]  Entrance take [place]  elevators to 3rd floor to  [place] at 0830 AM.  Call this number if you have problems the morning of surgery [phone]   Remember: ONLY 1 PERSON MAY GO WITH YOU TO SHORT STAY TO GET  READY MORNING OF [place].   Do not eat food or drink liquids :After Midnight.     Take these medicines the morning of surgery with A SIP OF WATER: use Advair and Albuterol inhaler, Duoneb inhaler if needed and bring them with you to hospital, Arimidex, use eye drops DO NOT TAKE ANY DIABETIC MEDICATIONS DAY OF YOUR SURGERY                               You may not have any metal on your body including hair pins and              piercings  Do not wear jewelry, make-up, lotions, powders or perfumes, deodorant             Do not wear nail polish.  Do not shave  48 hours prior to surgery.              Men may shave face and neck.   Do not bring valuables to the hospital. [place].  Contacts, dentures or bridgework may not be worn into surgery.  Leave suitcase in the car. After surgery it may be brought to your room.                   Please read over the following fact sheets you were given: _____________________________________________________________________             [facility] - Preparing for Surgery Before surgery, you can play an important role.  Because skin is not sterile, your skin needs to be as free of germs as possible.  You can reduce the number of germs on your skin by washing with CHG (chlorahexidine gluconate) soap before surgery.  CHG is an antiseptic cleaner which kills germs and bonds with the skin to continue killing germs even after washing. Please DO NOT use if you have an allergy to CHG or antibacterial soaps.  If your skin becomes reddened/irritated stop using  the CHG and inform your nurse when you arrive at Short Stay. Do not shave (including legs and underarms) for at least 48 hours prior to the first CHG shower.  You may shave your face/neck. Please follow these instructions carefully:  1.  Shower with CHG Soap the night before surgery and the  morning of Surgery.  2.  If you choose to wash your hair, wash your hair first as usual with your  normal  shampoo.  3.  After you shampoo, rinse your hair and body thoroughly to remove the  shampoo.                           4.  Use CHG as you would any other liquid soap.  You can apply chg directly  to the skin and wash  Gently with a scrungie or clean washcloth.  5.  Apply the CHG Soap to your body ONLY FROM THE NECK DOWN.   Do not use on face/ open                           Wound or open sores. Avoid contact with eyes, ears mouth and genitals (private parts).                       Wash face,  Genitals (private parts) with your normal soap.             6.  Wash thoroughly, paying special attention to the area where your surgery  will be performed.  7.  Thoroughly rinse your body with warm water from the neck down.  8.  DO NOT shower/wash with your normal soap after using and rinsing off  the CHG Soap.                9.  Pat yourself dry with a clean towel.            10.  Wear clean pajamas.            11.  Place clean sheets on your bed the night of your first shower and do not  sleep with pets. Day of Surgery : Do not apply any lotions/deodorants the morning of surgery.  Please wear clean clothes to the hospital/surgery center.  FAILURE TO FOLLOW THESE INSTRUCTIONS MAY RESULT IN THE CANCELLATION OF YOUR SURGERY PATIENT SIGNATURE_________________________________  NURSE SIGNATURE__________________________________  ________________________________________________________________________   Adam [facility]ix  An incentive spirometer is a tool that can help keep your lungs  clear and active. This tool measures how well you are filling your lungs with each breath. Taking long deep breaths may help reverse or decrease the chance of developing breathing (pulmonary) problems (especially infection) following:  A long period of time when you are unable to move or be active. BEFORE THE PROCEDURE   If the spirometer includes an indicator to show your best effort, your nurse or respiratory therapist will set it to a desired goal.  If possible, sit up straight or lean slightly forward. Try not to slouch.  Hold the incentive spirometer in an upright position. INSTRUCTIONS FOR USE   Sit on the edge of your bed if possible, or sit up as far as you can in bed or on a chair.  Hold the incentive spirometer in an upright position.  Breathe out normally.  Place the mouthpiece in your mouth and seal your lips tightly around it.  Breathe in slowly and as deeply as possible, raising the piston or the ball toward the top of the column.  Hold your breath for 3-5 seconds or for as long as possible. Allow the piston or ball to fall to the bottom of the column.  Remove the mouthpiece from your mouth and breathe out normally.  Rest for a few seconds and repeat Steps 1 through 7 at least 10 times every 1-2 hours when you are awake. Take your time and take a few normal breaths between deep breaths.  The spirometer may include an indicator to show your best effort. Use the indicator as a goal to work toward during each repetition.  After each set of 10 deep breaths, practice coughing to be sure your lungs are clear. If you have an incision (the cut made at the time of surgery),  support your incision when coughing by placing a pillow or rolled up towels firmly against it. Once you are able to get out of bed, walk around indoors and cough well. You may stop using the incentive spirometer when instructed by your caregiver.  RISKS AND COMPLICATIONS  Take your time so you do not get  dizzy or light-headed.  If you are in pain, you may need to take or ask for pain medication before doing incentive spirometry. It is harder to take a deep breath if you are having pain. AFTER USE  Rest and breathe slowly and easily.  It can be helpful to keep track of a log of your progress. Your caregiver can provide you with a simple table to help with this. If you are using the spirometer at home, follow these instructions: [place] IF:   You are having difficultly using the spirometer.  You have trouble using the spirometer as often as instructed.  Your pain medication is not giving enough relief while using the spirometer.  You develop fever of 100.5 F (38.1 C) or higher. SEEK IMMEDIATE MEDICAL CARE IF:   You cough up bloody sputum that had not been present before.  You develop fever of 102 F (38.9 C) or greater.  You develop worsening pain at or near the incision site. MAKE SURE YOU:   Understand these instructions.  Will watch your condition.  Will get help right away if you are not doing well or get worse. Document Released: 08/14/2006 Document Revised: 06/26/2011 Document Reviewed: 10/15/2006 ExitCare Patient Information 2014 [place], [place].   ________________________________________________________________________  WHAT IS A BLOOD TRANSFUSION? Blood Transfusion Information  A transfusion is the replacement of blood or some of its parts. Blood is made up of multiple cells which provide different functions.  Red blood cells carry oxygen and are used for blood loss replacement.  White blood cells fight against infection.  Platelets control bleeding.  Plasma helps clot blood.  Other blood products are available for specialized needs, such as hemophilia or other clotting disorders. BEFORE THE TRANSFUSION  Who gives blood for transfusions?   Healthy volunteers who are fully evaluated to make sure their blood is safe. This is blood bank  blood. Transfusion therapy is the safest it has ever been in the practice of medicine. Before blood is taken from a donor, a complete history is taken to make sure that person has no history of diseases nor engages in risky social behavior (examples are intravenous drug use or sexual activity with multiple partners). The donor's travel history is screened to minimize risk of transmitting infections, such as malaria. The donated blood is tested for signs of infectious diseases, such as HIV and hepatitis. The blood is then tested to be sure it is compatible with you in order to minimize the chance of a transfusion reaction. If you or a relative donates blood, this is often done in anticipation of surgery and is not appropriate for emergency situations. It takes many days to process the donated blood. RISKS AND COMPLICATIONS Although transfusion therapy is very safe and saves many lives, the main dangers of transfusion include:   Getting an infectious disease.  Developing a transfusion reaction. This is an allergic reaction to something in the blood you were given. Every precaution is taken to prevent this. The decision to have a blood transfusion has been considered carefully by your caregiver before blood is given. Blood is not given unless the benefits outweigh the risks. AFTER THE TRANSFUSION  Right after receiving a blood transfusion, you will usually feel much better and more energetic. This is especially true if your red blood cells have gotten low (anemic). The transfusion raises the level of the red blood cells which carry oxygen, and this usually causes an energy increase.  The nurse administering the transfusion will monitor you carefully for complications. HOME CARE INSTRUCTIONS  No special instructions are needed after a transfusion. You may find your energy is better. Speak with your caregiver about any limitations on activity for underlying diseases you may have. SEEK MEDICAL CARE IF:    Your condition is not improving after your transfusion.  You develop redness or irritation at the intravenous (IV) site. SEEK IMMEDIATE MEDICAL CARE IF:  Any of the following symptoms occur over the next 12 hours:  Shaking chills.  You have a temperature by mouth above 102 F (38.9 C), not controlled by medicine.  Chest, back, or muscle pain.  People around you feel you are not acting correctly or are confused.  Shortness of breath or difficulty breathing.  Dizziness and fainting.  You get a rash or develop hives.  You have a decrease in urine output.  Your urine turns a dark color or changes to pink, red, or brown. Any of the following symptoms occur over the next 10 days:  You have a temperature by mouth above 102 F (38.9 C), not controlled by medicine.  Shortness of breath.  Weakness after normal activity.  The white part of the eye turns yellow (jaundice).  You have a decrease in the amount of urine or are urinating less often.  Your urine turns a dark color or changes to pink, red, or brown. Document Released: 03/31/2000 Document Revised: 06/26/2011 Document Reviewed: 11/18/2007 [facility] Patient Information 2014 [place], [place].  _______________________________________________________________________

## 2015-08-11 NOTE — Progress Notes (Signed)
   08/11/15 1102  OBSTRUCTIVE SLEEP APNEA  Have you ever been diagnosed with sleep apnea through a sleep study? No  Do you snore loudly (loud enough to be heard through closed doors)?  0  Do you often feel tired, fatigued, or sleepy during the daytime (such as falling asleep during driving or talking to someone)? 1  Has anyone observed you stop breathing during your sleep? 0  Do you have, or are you being treated for high blood pressure? 1  BMI more than 35 kg/m2? 1  Age > 50 (1-yes) 1  Neck circumference greater than:Female 16 inches or larger, Female 17inches or larger? 1  Female Gender (Yes=1) 0  Obstructive Sleep Apnea Score 5  Score 5 or greater  Results sent to PCP

## 2015-08-12 ENCOUNTER — Other Ambulatory Visit: Payer: Self-pay | Admitting: Orthopedic Surgery

## 2015-08-12 ENCOUNTER — Inpatient Hospital Stay (HOSPITAL_COMMUNITY)
Admission: RE | Admit: 2015-08-12 | Discharge: 2015-08-12 | Disposition: A | Discharge status: Home / Self Care | Payer: Medicare Other | Source: Ambulatory Visit

## 2015-08-13 ENCOUNTER — Inpatient Hospital Stay (HOSPITAL_COMMUNITY)
Admission: RE | Admit: 2015-08-13 | Discharge: 2015-08-13 | Disposition: A | Discharge status: Home / Self Care | Payer: Medicare Other | Source: Ambulatory Visit

## 2015-08-13 DIAGNOSIS — Z01818 Encounter for other preprocedural examination: Secondary | ICD-10-CM | POA: Diagnosis not present

## 2015-08-13 LAB — SURGICAL PCR SCREEN
MRSA, PCR: NEGATIVE
Staphylococcus aureus: NEGATIVE

## 2015-08-22 ENCOUNTER — Ambulatory Visit: Payer: Self-pay | Admitting: Orthopedic Surgery

## 2015-08-22 NOTE — H&P (Signed)
Ann Maxwell DOB: 1941/04/04 Divorced / Language: Cleophus Molt / Race: White Female Date of Admission:  08/23/2015 CC:  Left Knee Pain History of Present Illness  The patient is a 75 year old female who comes in for a preoperative History and Physical. The patient is scheduled for a left total knee arthroplasty to be performed by Dr. Dione Plover. Aluisio, MD at [facility] on 08-23-2015. The patient is a 75 year old female who presented for follow up of their knee. The patient is being followed for their left knee pain and osteoarthritis. They are now monghs out from Euflexxa series. Symptoms reported include: pain, aching, catching, grinding, instability, difficulty ambulating and difficulty arising from chair. The patient feels that they are doing poorly and report their pain level to be moderate to severe. The following medication has been used for pain control: tramadol, and Flexeril (requesting a refill on the Flexeril). The patient has not gotten any relief of their symptoms with viscosupplementation (She reports increased pain and grinding since she completed the series). Fortunately, her left knee has gotten progressively worse. It is now to the point where it is hurting her all the time. It is only what she can and cannot do. Cortisone and visco supplement injections have not helped. The knee is having a very negative effect on her life now. She is ready to proceed with surgery. They have been treated conservatively in the past for the above stated problem and despite conservative measures, they continue to have progressive pain and severe functional limitations and dysfunction. They have failed non-operative management including home exercise, medications, and injections. It is felt that they would benefit from undergoing total joint replacement. Risks and benefits of the procedure have been discussed with the patient and they elect to proceed with surgery. There are no active contraindications  to surgery such as ongoing infection or rapidly progressive neurological disease.   Problem List/Past Medical  Primary osteoarthritis of right knee (M17.11)  Anemia  Asthma  Breast Cancer  Left-sided Breast Cancer - AVOID LEFT ARM FOR IVS AND BPS Cardiac Arrhythmia  Colon Cancer  High blood pressure  Kidney Stone  Osteoarthritis  Hemorrhoids  Measles  Rubella  Allergies Terramycin *Tetracyclines**  Tylox *ANALGESICS - OPIOID*  GI Upset - Patient is able to take the plain Oxycodone without difficulty Adhesive Tape *MEDICAL DEVICES AND SUPPLIES*  Patient IS ABLE to use steri-strips and paper tape  Family History  Cancer  Maternal Grandmother, Mother, Paternal Grandfather, Sister. Cerebrovascular Accident  Father. Congestive Heart Failure  Father. Heart Disease  Father. Hypertension  Father. Kidney disease  Father. Osteoarthritis  Father, Mother. Osteoporosis  Father. Rheumatoid Arthritis  Maternal Grandmother. Father  Deceased. age 81: Stroke, CHF, Nephrectomy, HTN, Hyperthryoid, 2nd Degree Heart Block Mother  Deceased. age 65; Hypothyroid  Social History  Children  1 Current drinker  12/25/2014: Currently drinks beer and wine only occasionally per week Current work status  retired Furniture conservator/restorer weekly; does gym / [employer] Living situation  live alone Marital status  divorced No history of drug/alcohol rehab  Not under pain contract  Number of flights of stairs before winded  1 Tobacco / smoke exposure  12/25/2014: no Tobacco use  Never smoker. 12/25/2014  Medication History Hyzaar (100-12.5MG  Tablet, Oral) Active. Advair Diskus (250-50MCG/DOSE Aero Pow Br Act, Inhalation) Active. Singulair (10MG  Tablet, Oral) Active. Aspirin (81MG  Tablet Chewable, Oral) Active. Anastrozole (1MG  Tablet, Oral) Active. Amitriptyline HCl (25MG  Tablet, Oral) Active. Tylenol (500MG  Capsule, Oral) Active. Loratadine (  10MG  (Rapid) Tablet,  Oral) Active. Ipratropium-Albuterol (0.5-2.5 (3)MG/3ML Solution, Inhalation PRN) Active. OxyCODONE HCl (5MG  Tablet, Oral) Active.   Past Surgical History Tonsillectomy  age 48 Gallbladder Surgery  Date: 23. open Hysterectomy  Date: 15. complete (non-cancerous), TVH with BSO Ankle Surgery  Date: 1995. left Colectomy  Date: 04/2014. partial Mastectomy - Bilateral  Date: 12/2011. left Hardware Removal from Ankle  Date: 1997. ORIF Left Wrist  Date: 01/2014.   Review of Systems General Not Present- Chills, Fatigue, Fever, Memory Loss, Night Sweats, Weight Gain and Weight Loss. Skin Present- Itching. Not Present- Eczema, Hives, Lesions and Rash. HEENT Not Present- Dentures, Double Vision, Headache, Hearing Loss, Tinnitus and Visual Loss. Respiratory Present- Allergies. Not Present- Chronic Cough, Coughing up blood, Shortness of breath at rest and Shortness of breath with exertion. Cardiovascular Not Present- Chest Pain, Difficulty Breathing Lying Down, Murmur, Palpitations, Racing/skipping heartbeats and Swelling. Gastrointestinal Not Present- Abdominal Pain, Bloody Stool, Constipation, Diarrhea, Difficulty Swallowing, Heartburn, Jaundice, Loss of appetitie, Nausea and Vomiting. Female Genitourinary Present- Blood in Urine (March 2017) and Urinary frequency. Not Present- Discharge, Flank Pain, Incontinence, Painful Urination, Urgency, Urinary Retention, Urinating at Night and Weak urinary stream. Musculoskeletal Present- Back Pain, Joint Pain and Morning Stiffness. Not Present- Joint Swelling, Muscle Pain, Muscle Weakness and Spasms. Neurological Not Present- Blackout spells, Difficulty with balance, Dizziness, Paralysis, Tremor and Weakness. Psychiatric Not Present- Insomnia.  Vitals  Weight: 212 lb Height: 64.5in Body Surface Area: 2.02 m Body Mass Index: 35.83 kg/m  Pulse: 92 (Regular)  BP: 124/62 (Sitting, Right Arm, Standard)   Physical Exam General Mental  Status -Alert, cooperative and good historian. General Appearance-pleasant, Not in acute distress. Orientation-Oriented X3. Build & Nutrition-Well nourished and Well developed.  Head and Neck Head-normocephalic, atraumatic . Neck Global Assessment - supple, no bruit auscultated on the right, no bruit auscultated on the left.  Eye Vision-Wears corrective lenses. Pupil - Bilateral-Regular and Round. Motion - Bilateral-EOMI.  Chest and Lung Exam Auscultation Breath sounds - clear at anterior chest wall and clear at posterior chest wall. Adventitious sounds - No Adventitious sounds.  Cardiovascular Auscultation Rhythm - Regular rate and rhythm. Heart Sounds - S1 WNL and S2 WNL. Murmurs & Other Heart Sounds: Murmur 1 - Location - Aortic Area. Timing - Early systolic. Grade - II/VI. Character - Low pitched.  Abdomen Inspection Contour - Generalized moderate distention. Palpation/Percussion Tenderness - Abdomen is non-tender to palpation. Rigidity (guarding) - Abdomen is soft. Auscultation Auscultation of the abdomen reveals - Bowel sounds normal.  Female Genitourinary Note: Not done, not pertinent to present illness   Musculoskeletal Note: She is alert and oriented, in no apparent distress. Her left hip has normal motion with no discomfort. Her left knee; no effusion, range about 5 to 125, marked crepitus on range of motion, tenderness medial greater than lateral, no instability noted.  RADIOGRAPHS Radiographs do show significant medial narrowing just about bone on bone. She also has lateral narrowing in this patellofemoral bone on bone.   Assessment & Plan Primary osteoarthritis of right knee (M17.11) Primary osteoarthritis of left knee (M17.12)  Note:Surgical Plans: Left Total Knee Replacement  Disposition: Home with friends  PCP: Dr. Philip Aspen  Topical TXA - History of Cancer  Anesthesia Issues: None  Left-sided Breast Cancer - AVOID LEFT  ARM FOR IVS AND BPS  Signed electronically by Ok Edwards, III PA-C

## 2015-08-23 ENCOUNTER — Inpatient Hospital Stay (HOSPITAL_COMMUNITY): Payer: Medicare Other | Admitting: Registered Nurse

## 2015-08-23 ENCOUNTER — Encounter (HOSPITAL_COMMUNITY)
Admission: RE | Disposition: A | Discharge status: Home Health | Payer: Self-pay | Source: Ambulatory Visit | Attending: Orthopedic Surgery

## 2015-08-23 ENCOUNTER — Inpatient Hospital Stay (HOSPITAL_COMMUNITY)
Admission: RE | Admit: 2015-08-23 | Discharge: 2015-08-25 | DRG: 470 | Disposition: A | Discharge status: Home Health | Payer: Medicare Other | Source: Ambulatory Visit | Attending: Orthopedic Surgery | Admitting: Orthopedic Surgery

## 2015-08-23 ENCOUNTER — Encounter (HOSPITAL_COMMUNITY): Payer: Self-pay | Admitting: *Deleted

## 2015-08-23 DIAGNOSIS — Z9013 Acquired absence of bilateral breasts and nipples: Secondary | ICD-10-CM

## 2015-08-23 DIAGNOSIS — M170 Bilateral primary osteoarthritis of knee: Secondary | ICD-10-CM | POA: Diagnosis present

## 2015-08-23 DIAGNOSIS — Z79899 Other long term (current) drug therapy: Secondary | ICD-10-CM | POA: Diagnosis not present

## 2015-08-23 DIAGNOSIS — Z9049 Acquired absence of other specified parts of digestive tract: Secondary | ICD-10-CM | POA: Diagnosis not present

## 2015-08-23 DIAGNOSIS — I10 Essential (primary) hypertension: Secondary | ICD-10-CM | POA: Diagnosis present

## 2015-08-23 DIAGNOSIS — Z853 Personal history of malignant neoplasm of breast: Secondary | ICD-10-CM

## 2015-08-23 DIAGNOSIS — Z85038 Personal history of other malignant neoplasm of large intestine: Secondary | ICD-10-CM | POA: Diagnosis not present

## 2015-08-23 DIAGNOSIS — E669 Obesity, unspecified: Secondary | ICD-10-CM | POA: Diagnosis present

## 2015-08-23 DIAGNOSIS — Z7982 Long term (current) use of aspirin: Secondary | ICD-10-CM | POA: Diagnosis not present

## 2015-08-23 DIAGNOSIS — Z87442 Personal history of urinary calculi: Secondary | ICD-10-CM | POA: Diagnosis not present

## 2015-08-23 DIAGNOSIS — M1710 Unilateral primary osteoarthritis, unspecified knee: Secondary | ICD-10-CM | POA: Diagnosis present

## 2015-08-23 DIAGNOSIS — K219 Gastro-esophageal reflux disease without esophagitis: Secondary | ICD-10-CM | POA: Diagnosis present

## 2015-08-23 DIAGNOSIS — Z90710 Acquired absence of both cervix and uterus: Secondary | ICD-10-CM

## 2015-08-23 DIAGNOSIS — Z6835 Body mass index (BMI) 35.0-35.9, adult: Secondary | ICD-10-CM | POA: Diagnosis not present

## 2015-08-23 DIAGNOSIS — M179 Osteoarthritis of knee, unspecified: Secondary | ICD-10-CM | POA: Diagnosis present

## 2015-08-23 DIAGNOSIS — M25562 Pain in left knee: Secondary | ICD-10-CM | POA: Diagnosis present

## 2015-08-23 DIAGNOSIS — M1712 Unilateral primary osteoarthritis, left knee: Secondary | ICD-10-CM

## 2015-08-23 HISTORY — PX: TOTAL KNEE ARTHROPLASTY: SHX125

## 2015-08-23 SURGERY — ARTHROPLASTY, KNEE, TOTAL
Anesthesia: Spinal | Site: Knee | Laterality: Left

## 2015-08-23 MED ORDER — PROPOFOL 500 MG/50ML IV EMUL
INTRAVENOUS | Status: DC | PRN
  Administered 2015-08-23: 75 ug/kg/min via INTRAVENOUS

## 2015-08-23 MED ORDER — FENTANYL CITRATE (PF) 100 MCG/2ML IJ SOLN
INTRAMUSCULAR | Status: AC
  Filled 2015-08-23: qty 2

## 2015-08-23 MED ORDER — LACTATED RINGERS IV SOLN
INTRAVENOUS | Status: DC
  Administered 2015-08-23: 11:00:00 via INTRAVENOUS

## 2015-08-23 MED ORDER — DOCUSATE SODIUM 100 MG PO CAPS
100.0000 mg | ORAL_CAPSULE | Freq: Two times a day (BID) | ORAL | Status: DC
  Administered 2015-08-23 – 2015-08-25 (×4): 100 mg via ORAL
  Filled 2015-08-23 (×3): qty 1

## 2015-08-23 MED ORDER — HYDROMORPHONE HCL 1 MG/ML IJ SOLN
INTRAMUSCULAR | Status: AC
  Filled 2015-08-23: qty 1

## 2015-08-23 MED ORDER — LORATADINE 10 MG PO TABS
10.0000 mg | ORAL_TABLET | Freq: Every day | ORAL | Status: DC | PRN
  Filled 2015-08-23: qty 1

## 2015-08-23 MED ORDER — BUPIVACAINE LIPOSOME 1.3 % IJ SUSP
INTRAMUSCULAR | Status: DC | PRN
  Administered 2015-08-23: 20 mL

## 2015-08-23 MED ORDER — DIPHENHYDRAMINE HCL 12.5 MG/5ML PO ELIX: 12.5000 mg | ORAL_SOLUTION | ORAL | Status: DC | PRN

## 2015-08-23 MED ORDER — ALBUTEROL SULFATE (2.5 MG/3ML) 0.083% IN NEBU
3.0000 mL | INHALATION_SOLUTION | RESPIRATORY_TRACT | Status: DC | PRN
  Filled 2015-08-23: qty 3

## 2015-08-23 MED ORDER — AMITRIPTYLINE HCL 25 MG PO TABS
25.0000 mg | ORAL_TABLET | Freq: Every day | ORAL | Status: DC
  Administered 2015-08-23 – 2015-08-24 (×2): 25 mg via ORAL
  Filled 2015-08-23 (×4): qty 1

## 2015-08-23 MED ORDER — PROPOFOL 10 MG/ML IV BOLUS
INTRAVENOUS | Status: AC
  Filled 2015-08-23: qty 20

## 2015-08-23 MED ORDER — ONDANSETRON HCL 4 MG/2ML IJ SOLN
INTRAMUSCULAR | Status: AC
  Filled 2015-08-23: qty 2

## 2015-08-23 MED ORDER — CEFAZOLIN SODIUM-DEXTROSE 2-4 GM/100ML-% IV SOLN
2.0000 g | Freq: Four times a day (QID) | INTRAVENOUS | Status: AC
  Administered 2015-08-23 (×2): 2 g via INTRAVENOUS
  Filled 2015-08-23 (×2): qty 100

## 2015-08-23 MED ORDER — POLYETHYLENE GLYCOL 3350 17 G PO PACK
17.0000 g | PACK | Freq: Every day | ORAL | Status: DC | PRN
Start: 2015-08-23 — End: 2015-08-25

## 2015-08-23 MED ORDER — MORPHINE SULFATE (PF) 2 MG/ML IV SOLN
1.0000 mg | INTRAVENOUS | Status: DC | PRN
  Administered 2015-08-23 (×3): 1 mg via INTRAVENOUS
  Filled 2015-08-23 (×3): qty 1

## 2015-08-23 MED ORDER — OXYCODONE HCL 5 MG PO TABS
5.0000 mg | ORAL_TABLET | ORAL | Status: DC | PRN
  Administered 2015-08-23 – 2015-08-25 (×12): 10 mg via ORAL
  Filled 2015-08-23 (×13): qty 2

## 2015-08-23 MED ORDER — SODIUM CHLORIDE 0.9 % IV SOLN: INTRAVENOUS | Status: DC

## 2015-08-23 MED ORDER — LOSARTAN POTASSIUM-HCTZ 50-12.5 MG PO TABS: 1.0000 | ORAL_TABLET | Freq: Every day | ORAL | Status: DC

## 2015-08-23 MED ORDER — 0.9 % SODIUM CHLORIDE (POUR BTL) OPTIME
TOPICAL | Status: DC | PRN
  Administered 2015-08-23: 1000 mL

## 2015-08-23 MED ORDER — MOMETASONE FURO-FORMOTEROL FUM 200-5 MCG/ACT IN AERO
2.0000 | INHALATION_SPRAY | Freq: Two times a day (BID) | RESPIRATORY_TRACT | Status: DC
  Filled 2015-08-23: qty 8.8

## 2015-08-23 MED ORDER — BUPIVACAINE IN DEXTROSE 0.75-8.25 % IT SOLN
INTRATHECAL | Status: DC | PRN
  Administered 2015-08-23: 1.8 mL via INTRATHECAL

## 2015-08-23 MED ORDER — SODIUM CHLORIDE 0.9 % IV SOLN
INTRAVENOUS | Status: DC
  Administered 2015-08-23: 17:00:00 via INTRAVENOUS

## 2015-08-23 MED ORDER — LOSARTAN POTASSIUM 50 MG PO TABS
50.0000 mg | ORAL_TABLET | Freq: Every day | ORAL | Status: DC
  Administered 2015-08-24 – 2015-08-25 (×2): 50 mg via ORAL
  Filled 2015-08-23 (×2): qty 1

## 2015-08-23 MED ORDER — DEXAMETHASONE SODIUM PHOSPHATE 10 MG/ML IJ SOLN
10.0000 mg | Freq: Once | INTRAMUSCULAR | Status: AC
  Administered 2015-08-23: 10 mg via INTRAVENOUS

## 2015-08-23 MED ORDER — FLEET ENEMA 7-19 GM/118ML RE ENEM: 1.0000 | ENEMA | Freq: Once | RECTAL | Status: DC | PRN

## 2015-08-23 MED ORDER — ONDANSETRON HCL 4 MG/2ML IJ SOLN: 4.0000 mg | Freq: Four times a day (QID) | INTRAMUSCULAR | Status: DC | PRN

## 2015-08-23 MED ORDER — ACETAMINOPHEN 10 MG/ML IV SOLN
INTRAVENOUS | Status: DC | PRN
  Administered 2015-08-23: 1000 mg via INTRAVENOUS

## 2015-08-23 MED ORDER — ACETAMINOPHEN 10 MG/ML IV SOLN
INTRAVENOUS | Status: AC
  Filled 2015-08-23: qty 100

## 2015-08-23 MED ORDER — TRANEXAMIC ACID 1000 MG/10ML IV SOLN
2000.0000 mg | INTRAVENOUS | Status: DC | PRN
  Administered 2015-08-23: 2000 mg via TOPICAL

## 2015-08-23 MED ORDER — FENTANYL CITRATE (PF) 100 MCG/2ML IJ SOLN
INTRAMUSCULAR | Status: DC | PRN
  Administered 2015-08-23 (×2): 50 ug via INTRAVENOUS

## 2015-08-23 MED ORDER — METOCLOPRAMIDE HCL 10 MG PO TABS: 5.0000 mg | ORAL_TABLET | Freq: Three times a day (TID) | ORAL | Status: DC | PRN

## 2015-08-23 MED ORDER — TRAMADOL HCL 50 MG PO TABS
50.0000 mg | ORAL_TABLET | Freq: Four times a day (QID) | ORAL | Status: DC | PRN
  Administered 2015-08-24: 100 mg via ORAL
  Filled 2015-08-23: qty 2

## 2015-08-23 MED ORDER — BUPIVACAINE HCL (PF) 0.25 % IJ SOLN
INTRAMUSCULAR | Status: AC
  Filled 2015-08-23: qty 30

## 2015-08-23 MED ORDER — MIDAZOLAM HCL 5 MG/5ML IJ SOLN
INTRAMUSCULAR | Status: DC | PRN
  Administered 2015-08-23: 1 mg via INTRAVENOUS

## 2015-08-23 MED ORDER — SODIUM CHLORIDE 0.9 % IR SOLN
Status: DC | PRN
Start: 2015-08-23 — End: 2015-08-23
  Administered 2015-08-23: 1000 mL

## 2015-08-23 MED ORDER — LACTATED RINGERS IV SOLN
INTRAVENOUS | Status: DC | PRN
  Administered 2015-08-23 (×3): via INTRAVENOUS

## 2015-08-23 MED ORDER — ONDANSETRON HCL 4 MG/2ML IJ SOLN
INTRAMUSCULAR | Status: DC | PRN
  Administered 2015-08-23: 4 mg via INTRAVENOUS

## 2015-08-23 MED ORDER — MENTHOL 3 MG MT LOZG: 1.0000 | LOZENGE | OROMUCOSAL | Status: DC | PRN

## 2015-08-23 MED ORDER — BUPIVACAINE HCL 0.25 % IJ SOLN
INTRAMUSCULAR | Status: DC | PRN
  Administered 2015-08-23: 20 mL

## 2015-08-23 MED ORDER — DEXAMETHASONE SODIUM PHOSPHATE 10 MG/ML IJ SOLN
INTRAMUSCULAR | Status: AC
  Filled 2015-08-23: qty 1

## 2015-08-23 MED ORDER — PROPOFOL 10 MG/ML IV BOLUS: INTRAVENOUS | Status: DC | PRN

## 2015-08-23 MED ORDER — BUPIVACAINE LIPOSOME 1.3 % IJ SUSP
20.0000 mL | Freq: Once | INTRAMUSCULAR | Status: DC
  Filled 2015-08-23: qty 20

## 2015-08-23 MED ORDER — RIVAROXABAN 10 MG PO TABS
10.0000 mg | ORAL_TABLET | Freq: Every day | ORAL | Status: DC
  Administered 2015-08-24 – 2015-08-25 (×2): 10 mg via ORAL
  Filled 2015-08-23 (×3): qty 1

## 2015-08-23 MED ORDER — CEFAZOLIN SODIUM-DEXTROSE 2-4 GM/100ML-% IV SOLN
INTRAVENOUS | Status: AC
  Filled 2015-08-23: qty 100

## 2015-08-23 MED ORDER — IPRATROPIUM-ALBUTEROL 0.5-2.5 (3) MG/3ML IN SOLN
3.0000 mL | Freq: Four times a day (QID) | RESPIRATORY_TRACT | Status: DC | PRN
  Filled 2015-08-23: qty 3

## 2015-08-23 MED ORDER — MEPERIDINE HCL 50 MG/ML IJ SOLN: 6.2500 mg | INTRAMUSCULAR | Status: DC | PRN

## 2015-08-23 MED ORDER — ACETAMINOPHEN 10 MG/ML IV SOLN
1000.0000 mg | Freq: Once | INTRAVENOUS | Status: DC
  Filled 2015-08-23: qty 100

## 2015-08-23 MED ORDER — ONDANSETRON HCL 4 MG/2ML IJ SOLN: 4.0000 mg | Freq: Once | INTRAMUSCULAR | Status: DC | PRN

## 2015-08-23 MED ORDER — ANASTROZOLE 1 MG PO TABS
1.0000 mg | ORAL_TABLET | Freq: Every day | ORAL | Status: DC
  Administered 2015-08-24 – 2015-08-25 (×2): 1 mg via ORAL
  Filled 2015-08-23 (×2): qty 1

## 2015-08-23 MED ORDER — SODIUM CHLORIDE 0.9 % IJ SOLN
INTRAMUSCULAR | Status: AC
  Filled 2015-08-23: qty 50

## 2015-08-23 MED ORDER — MONTELUKAST SODIUM 10 MG PO TABS
10.0000 mg | ORAL_TABLET | Freq: Every day | ORAL | Status: DC
  Administered 2015-08-23 – 2015-08-24 (×2): 10 mg via ORAL
  Filled 2015-08-23 (×3): qty 1

## 2015-08-23 MED ORDER — TRANEXAMIC ACID 1000 MG/10ML IV SOLN
2000.0000 mg | Freq: Once | INTRAVENOUS | Status: DC
  Filled 2015-08-23: qty 20

## 2015-08-23 MED ORDER — MIDAZOLAM HCL 2 MG/2ML IJ SOLN
INTRAMUSCULAR | Status: AC
Start: 2015-08-23 — End: 2015-08-23
  Filled 2015-08-23: qty 2

## 2015-08-23 MED ORDER — ACETAMINOPHEN 325 MG PO TABS: 650.0000 mg | ORAL_TABLET | Freq: Four times a day (QID) | ORAL | Status: DC | PRN

## 2015-08-23 MED ORDER — HYDROCHLOROTHIAZIDE 12.5 MG PO CAPS
12.5000 mg | ORAL_CAPSULE | Freq: Every day | ORAL | Status: DC
  Administered 2015-08-24 – 2015-08-25 (×2): 12.5 mg via ORAL
  Filled 2015-08-23 (×2): qty 1

## 2015-08-23 MED ORDER — METHOCARBAMOL 500 MG PO TABS
500.0000 mg | ORAL_TABLET | Freq: Four times a day (QID) | ORAL | Status: DC | PRN
  Administered 2015-08-23 – 2015-08-24 (×4): 500 mg via ORAL
  Filled 2015-08-23 (×4): qty 1

## 2015-08-23 MED ORDER — STERILE WATER FOR IRRIGATION IR SOLN
Status: DC | PRN
  Administered 2015-08-23: 2000 mL

## 2015-08-23 MED ORDER — ONDANSETRON HCL 4 MG PO TABS: 4.0000 mg | ORAL_TABLET | Freq: Four times a day (QID) | ORAL | Status: DC | PRN

## 2015-08-23 MED ORDER — SODIUM CHLORIDE 0.9 % IJ SOLN
INTRAMUSCULAR | Status: DC | PRN
  Administered 2015-08-23: 30 mL

## 2015-08-23 MED ORDER — METOCLOPRAMIDE HCL 5 MG/ML IJ SOLN: 5.0000 mg | Freq: Three times a day (TID) | INTRAMUSCULAR | Status: DC | PRN

## 2015-08-23 MED ORDER — ACETAMINOPHEN 500 MG PO TABS
1000.0000 mg | ORAL_TABLET | Freq: Four times a day (QID) | ORAL | Status: AC
  Administered 2015-08-23 – 2015-08-24 (×4): 1000 mg via ORAL
  Filled 2015-08-23 (×5): qty 2

## 2015-08-23 MED ORDER — BISACODYL 10 MG RE SUPP: 10.0000 mg | Freq: Every day | RECTAL | Status: DC | PRN

## 2015-08-23 MED ORDER — DEXAMETHASONE SODIUM PHOSPHATE 10 MG/ML IJ SOLN
10.0000 mg | Freq: Once | INTRAMUSCULAR | Status: AC
  Administered 2015-08-24: 10 mg via INTRAVENOUS
  Filled 2015-08-23: qty 1

## 2015-08-23 MED ORDER — METHOCARBAMOL 1000 MG/10ML IJ SOLN
500.0000 mg | Freq: Four times a day (QID) | INTRAVENOUS | Status: DC | PRN
  Administered 2015-08-23: 500 mg via INTRAVENOUS
  Filled 2015-08-23: qty 5
  Filled 2015-08-23: qty 550

## 2015-08-23 MED ORDER — PHENOL 1.4 % MT LIQD
1.0000 | OROMUCOSAL | Status: DC | PRN
  Filled 2015-08-23: qty 177

## 2015-08-23 MED ORDER — PROPOFOL 10 MG/ML IV BOLUS
INTRAVENOUS | Status: AC
  Filled 2015-08-23: qty 60

## 2015-08-23 MED ORDER — CHLORHEXIDINE GLUCONATE 4 % EX LIQD: 60.0000 mL | Freq: Once | CUTANEOUS | Status: DC

## 2015-08-23 MED ORDER — ACETAMINOPHEN 650 MG RE SUPP: 650.0000 mg | Freq: Four times a day (QID) | RECTAL | Status: DC | PRN

## 2015-08-23 MED ORDER — HYDROMORPHONE HCL 1 MG/ML IJ SOLN
0.2500 mg | INTRAMUSCULAR | Status: DC | PRN
  Administered 2015-08-23 (×2): 0.5 mg via INTRAVENOUS

## 2015-08-23 MED ORDER — CEFAZOLIN SODIUM-DEXTROSE 2-4 GM/100ML-% IV SOLN
2.0000 g | INTRAVENOUS | Status: AC
  Administered 2015-08-23: 2 g via INTRAVENOUS
  Filled 2015-08-23: qty 100

## 2015-08-23 SURGICAL SUPPLY — 54 items
BAG DECANTER FOR FLEXI CONT (MISCELLANEOUS) ×3 IMPLANT
BAG SPEC THK2 15X12 ZIP CLS (MISCELLANEOUS) ×1
BAG ZIPLOCK 12X15 (MISCELLANEOUS) ×3 IMPLANT
BANDAGE ACE 6X5 VEL STRL LF (GAUZE/BANDAGES/DRESSINGS) ×3 IMPLANT
BLADE SAG 18X100X1.27 (BLADE) ×3 IMPLANT
BLADE SAW SGTL 11.0X1.19X90.0M (BLADE) ×3 IMPLANT
BOWL SMART MIX CTS (DISPOSABLE) ×3 IMPLANT
CAP KNEE TOTAL 3 SIGMA ×2 IMPLANT
CEMENT HV SMART SET (Cement) ×6 IMPLANT
CLOSURE WOUND 1/2 X4 (GAUZE/BANDAGES/DRESSINGS) ×1
CLOTH BEACON ORANGE TIMEOUT ST (SAFETY) ×3 IMPLANT
CUFF TOURN SGL QUICK 34 (TOURNIQUET CUFF) ×3
CUFF TRNQT CYL 34X4X40X1 (TOURNIQUET CUFF) ×1 IMPLANT
DECANTER SPIKE VIAL GLASS SM (MISCELLANEOUS) ×3 IMPLANT
DRAPE U-SHAPE 47X51 STRL (DRAPES) ×3 IMPLANT
DRSG ADAPTIC 3X8 NADH LF (GAUZE/BANDAGES/DRESSINGS) ×3 IMPLANT
DRSG PAD ABDOMINAL 8X10 ST (GAUZE/BANDAGES/DRESSINGS) ×3 IMPLANT
DURAPREP 26ML APPLICATOR (WOUND CARE) ×3 IMPLANT
ELECT PENCIL ROCKER SW 15FT (MISCELLANEOUS) ×2 IMPLANT
ELECT REM PT RETURN 9FT ADLT (ELECTROSURGICAL) ×3
ELECTRODE REM PT RTRN 9FT ADLT (ELECTROSURGICAL) ×1 IMPLANT
EVACUATOR 1/8 PVC DRAIN (DRAIN) ×3 IMPLANT
GAUZE SPONGE 4X4 12PLY STRL (GAUZE/BANDAGES/DRESSINGS) ×3 IMPLANT
GLOVE BIO SURGEON STRL SZ7.5 (GLOVE) ×2 IMPLANT
GLOVE BIO SURGEON STRL SZ8 (GLOVE) ×3 IMPLANT
GLOVE BIOGEL PI IND STRL 6.5 (GLOVE) IMPLANT
GLOVE BIOGEL PI IND STRL 7.5 (GLOVE) IMPLANT
GLOVE BIOGEL PI IND STRL 8 (GLOVE) ×1 IMPLANT
GLOVE BIOGEL PI INDICATOR 6.5 (GLOVE) ×4
GLOVE BIOGEL PI INDICATOR 7.5 (GLOVE) ×6
GLOVE BIOGEL PI INDICATOR 8 (GLOVE) ×2
GLOVE SURG SS PI 6.5 STRL IVOR (GLOVE) ×2 IMPLANT
GLOVE SURG SS PI 7.0 STRL IVOR (GLOVE) ×2 IMPLANT
GLOVE SURG SS PI 7.5 STRL IVOR (GLOVE) ×2 IMPLANT
GOWN STRL REUS W/ TWL LRG LVL3 (GOWN DISPOSABLE) IMPLANT
GOWN STRL REUS W/TWL LRG LVL3 (GOWN DISPOSABLE) ×6 IMPLANT
GOWN STRL REUS W/TWL XL LVL3 (GOWN DISPOSABLE) ×4 IMPLANT
HANDPIECE INTERPULSE COAX TIP (DISPOSABLE) ×3
IMMOBILIZER KNEE 20 (SOFTGOODS) ×2 IMPLANT
MANIFOLD NEPTUNE II (INSTRUMENTS) ×3 IMPLANT
PACK TOTAL KNEE CUSTOM (KITS) ×3 IMPLANT
PAD ABD 8X10 STRL (GAUZE/BANDAGES/DRESSINGS) ×2 IMPLANT
PADDING CAST COTTON 6X4 STRL (CAST SUPPLIES) ×5 IMPLANT
POSITIONER SURGICAL ARM (MISCELLANEOUS) ×3 IMPLANT
SET HNDPC FAN SPRY TIP SCT (DISPOSABLE) ×1 IMPLANT
STRIP CLOSURE SKIN 1/2X4 (GAUZE/BANDAGES/DRESSINGS) ×3 IMPLANT
SUT MNCRL AB 4-0 PS2 18 (SUTURE) ×3 IMPLANT
SUT VIC AB 2-0 CT1 27 (SUTURE) ×9
SUT VIC AB 2-0 CT1 TAPERPNT 27 (SUTURE) ×3 IMPLANT
SUT VLOC 180 0 24IN GS25 (SUTURE) ×3 IMPLANT
SYR 50ML LL SCALE MARK (SYRINGE) ×3 IMPLANT
TRAY FOLEY W/METER SILVER 14FR (SET/KITS/TRAYS/PACK) ×3 IMPLANT
WRAP KNEE MAXI GEL POST OP (GAUZE/BANDAGES/DRESSINGS) ×3 IMPLANT
YANKAUER SUCT BULB TIP 10FT TU (MISCELLANEOUS) ×3 IMPLANT

## 2015-08-23 NOTE — H&P (View-Only) (Signed)
Ann Maxwell DOB: 05-22-1940 Divorced / Language: Cleophus Molt / Race: White Female Date of Admission:  08/23/2015 CC:  Left Knee Pain History of Present Illness  The patient is a 75 year old female who comes in for a preoperative History and Physical. The patient is scheduled for a left total knee arthroplasty to be performed by Dr. Dione Plover. Aluisio, MD at [facility] on 08-23-2015. The patient is a 75 year old female who presented for follow up of their knee. The patient is being followed for their left knee pain and osteoarthritis. They are now monghs out from Euflexxa series. Symptoms reported include: pain, aching, catching, grinding, instability, difficulty ambulating and difficulty arising from chair. The patient feels that they are doing poorly and report their pain level to be moderate to severe. The following medication has been used for pain control: tramadol, and Flexeril (requesting a refill on the Flexeril). The patient has not gotten any relief of their symptoms with viscosupplementation (She reports increased pain and grinding since she completed the series). Fortunately, her left knee has gotten progressively worse. It is now to the point where it is hurting her all the time. It is only what she can and cannot do. Cortisone and visco supplement injections have not helped. The knee is having a very negative effect on her life now. She is ready to proceed with surgery. They have been treated conservatively in the past for the above stated problem and despite conservative measures, they continue to have progressive pain and severe functional limitations and dysfunction. They have failed non-operative management including home exercise, medications, and injections. It is felt that they would benefit from undergoing total joint replacement. Risks and benefits of the procedure have been discussed with the patient and they elect to proceed with surgery. There are no active contraindications  to surgery such as ongoing infection or rapidly progressive neurological disease.   Problem List/Past Medical  Primary osteoarthritis of right knee (M17.11)  Anemia  Asthma  Breast Cancer  Left-sided Breast Cancer - AVOID LEFT ARM FOR IVS AND BPS Cardiac Arrhythmia  Colon Cancer  High blood pressure  Kidney Stone  Osteoarthritis  Hemorrhoids  Measles  Rubella  Allergies Terramycin *Tetracyclines**  Tylox *ANALGESICS - OPIOID*  GI Upset - Patient is able to take the plain Oxycodone without difficulty Adhesive Tape *MEDICAL DEVICES AND SUPPLIES*  Patient IS ABLE to use steri-strips and paper tape  Family History  Cancer  Maternal Grandmother, Mother, Paternal Grandfather, Sister. Cerebrovascular Accident  Father. Congestive Heart Failure  Father. Heart Disease  Father. Hypertension  Father. Kidney disease  Father. Osteoarthritis  Father, Mother. Osteoporosis  Father. Rheumatoid Arthritis  Maternal Grandmother. Father  Deceased. age 26: Stroke, CHF, Nephrectomy, HTN, Hyperthryoid, 2nd Degree Heart Block Mother  Deceased. 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 12/25/2014  Medication History Hyzaar (100-12.5MG  Tablet, Oral) Active. Advair Diskus (250-50MCG/DOSE Aero Pow Br Act, Inhalation) Active. Singulair (10MG  Tablet, Oral) Active. Aspirin (81MG  Tablet Chewable, Oral) Active. Anastrozole (1MG  Tablet, Oral) Active. Amitriptyline HCl (25MG  Tablet, Oral) Active. Tylenol (500MG  Capsule, Oral) Active. Loratadine (  10MG  (Rapid) Tablet,  Oral) Active. Ipratropium-Albuterol (0.5-2.5 (3)MG/3ML Solution, Inhalation PRN) Active. OxyCODONE HCl (5MG  Tablet, Oral) Active.   Past Surgical History Tonsillectomy  age 78 Gallbladder Surgery  Date: 76. open Hysterectomy  Date: 65. complete (non-cancerous), TVH with BSO Ankle Surgery  Date: 1995. left Colectomy  Date: 04/2014. partial Mastectomy - Bilateral  Date: 12/2011. left Hardware Removal from Ankle  Date: 1997. ORIF Left Wrist  Date: 01/2014.   Review of Systems General Not Present- Chills, Fatigue, Fever, Memory Loss, Night Sweats, Weight Gain and Weight Loss. Skin Present- Itching. Not Present- Eczema, Hives, Lesions and Rash. HEENT Not Present- Dentures, Double Vision, Headache, Hearing Loss, Tinnitus and Visual Loss. Respiratory Present- Allergies. Not Present- Chronic Cough, Coughing up blood, Shortness of breath at rest and Shortness of breath with exertion. Cardiovascular Not Present- Chest Pain, Difficulty Breathing Lying Down, Murmur, Palpitations, Racing/skipping heartbeats and Swelling. Gastrointestinal Not Present- Abdominal Pain, Bloody Stool, Constipation, Diarrhea, Difficulty Swallowing, Heartburn, Jaundice, Loss of appetitie, Nausea and Vomiting. Female Genitourinary Present- Blood in Urine (March 2017) and Urinary frequency. Not Present- Discharge, Flank Pain, Incontinence, Painful Urination, Urgency, Urinary Retention, Urinating at Night and Weak urinary stream. Musculoskeletal Present- Back Pain, Joint Pain and Morning Stiffness. Not Present- Joint Swelling, Muscle Pain, Muscle Weakness and Spasms. Neurological Not Present- Blackout spells, Difficulty with balance, Dizziness, Paralysis, Tremor and Weakness. Psychiatric Not Present- Insomnia.  Vitals  Weight: 212 lb Height: 64.5in Body Surface Area: 2.02 m Body Mass Index: 35.83 kg/m  Pulse: 92 (Regular)  BP: 124/62 (Sitting, Right Arm, Standard)   Physical Exam General Mental  Status -Alert, cooperative and good historian. General Appearance-pleasant, Not in acute distress. Orientation-Oriented X3. Build & Nutrition-Well nourished and Well developed.  Head and Neck Head-normocephalic, atraumatic . Neck Global Assessment - supple, no bruit auscultated on the right, no bruit auscultated on the left.  Eye Vision-Wears corrective lenses. Pupil - Bilateral-Regular and Round. Motion - Bilateral-EOMI.  Chest and Lung Exam Auscultation Breath sounds - clear at anterior chest wall and clear at posterior chest wall. Adventitious sounds - No Adventitious sounds.  Cardiovascular Auscultation Rhythm - Regular rate and rhythm. Heart Sounds - S1 WNL and S2 WNL. Murmurs & Other Heart Sounds: Murmur 1 - Location - Aortic Area. Timing - Early systolic. Grade - II/VI. Character - Low pitched.  Abdomen Inspection Contour - Generalized moderate distention. Palpation/Percussion Tenderness - Abdomen is non-tender to palpation. Rigidity (guarding) - Abdomen is soft. Auscultation Auscultation of the abdomen reveals - Bowel sounds normal.  Female Genitourinary Note: Not done, not pertinent to present illness   Musculoskeletal Note: She is alert and oriented, in no apparent distress. Her left hip has normal motion with no discomfort. Her left knee; no effusion, range about 5 to 125, marked crepitus on range of motion, tenderness medial greater than lateral, no instability noted.  RADIOGRAPHS Radiographs do show significant medial narrowing just about bone on bone. She also has lateral narrowing in this patellofemoral bone on bone.   Assessment & Plan Primary osteoarthritis of right knee (M17.11) Primary osteoarthritis of left knee (M17.12)  Note:Surgical Plans: Left Total Knee Replacement  Disposition: Home with friends  PCP: Dr. Philip Aspen  Topical TXA - History of Cancer  Anesthesia Issues: None  Left-sided Breast Cancer - AVOID LEFT  ARM FOR IVS AND BPS  Signed electronically by Ok Edwards, III PA-C

## 2015-08-23 NOTE — Anesthesia Procedure Notes (Signed)
Spinal Patient location during procedure: OR Start time: 08/23/2015 11:06 AM End time: 08/23/2015 11:10 AM Staffing Anesthesiologist: Lillia Abed Performed by: anesthesiologist  Preanesthetic Checklist Completed: patient identified, site marked, surgical consent, pre-op evaluation, timeout performed, IV checked, risks and benefits discussed and monitors and equipment checked Spinal Block Patient position: sitting Prep: ChloraPrep Patient monitoring: heart rate, cardiac monitor, continuous pulse ox and blood pressure Approach: right paramedian Location: L3-4 Injection technique: single-shot Needle Needle type: Pencan  Needle gauge: 24 G Needle length: 9 cm Needle insertion depth: 6 cm

## 2015-08-23 NOTE — Anesthesia Preprocedure Evaluation (Signed)
Anesthesia Evaluation  Patient identified by MRN, date of birth, ID band Patient awake    Reviewed: Allergy & Precautions, NPO status , Patient's Chart, lab work & pertinent test results  Airway Mallampati: I  TM Distance: >3 FB Neck ROM: Full    Dental   Pulmonary    Pulmonary exam normal        Cardiovascular hypertension, Pt. on medications Normal cardiovascular exam     Neuro/Psych Depression    GI/Hepatic GERD  Medicated and Controlled,  Endo/Other    Renal/GU      Musculoskeletal   Abdominal   Peds  Hematology   Anesthesia Other Findings   Reproductive/Obstetrics                             Anesthesia Physical Anesthesia Plan  ASA: II  Anesthesia Plan: Spinal   Post-op Pain Management:    Induction: Intravenous  Airway Management Planned: Natural Airway  Additional Equipment:   Intra-op Plan:   Post-operative Plan:   Informed Consent: I have reviewed the patients History and Physical, chart, labs and discussed the procedure including the risks, benefits and alternatives for the proposed anesthesia with the patient or authorized representative who has indicated his/her understanding and acceptance.     Plan Discussed with: CRNA and Surgeon  Anesthesia Plan Comments:         Anesthesia Quick Evaluation

## 2015-08-23 NOTE — Progress Notes (Signed)
Utilization review completed.  

## 2015-08-23 NOTE — Progress Notes (Signed)
Patient refused Dulera. She took her Advair from Home.

## 2015-08-23 NOTE — Transfer of Care (Signed)
Immediate Anesthesia Transfer of Care Note  Patient: Ann Maxwell  Procedure(s) Performed: Procedure(s): TOTAL KNEE ARTHROPLASTY (Left)  Patient Location: PACU  Anesthesia Type:Spinal  Level of Consciousness: awake, alert , oriented and patient cooperative  Airway & Oxygen Therapy: Patient Spontanous Breathing and Patient connected to face mask oxygen  Post-op Assessment: Report given to RN and Post -op Vital signs reviewed and stable  Post vital signs: stable  Last Vitals:  Filed Vitals:   08/23/15 0838  BP: 132/97  Pulse: 109  Temp: 36.7 C  Resp: 18    Last Pain: There were no vitals filed for this visit.    Patients Stated Pain Goal: 3 ([id number])  Complications: No apparent anesthesia complications  [id number] spinal level

## 2015-08-23 NOTE — Anesthesia Postprocedure Evaluation (Signed)
Anesthesia Post Note  Patient: Ann Maxwell  Procedure(s) Performed: Procedure(s) (LRB): TOTAL KNEE ARTHROPLASTY (Left)  Patient location during evaluation: PACU Anesthesia Type: Spinal Level of consciousness: oriented and awake and alert Pain management: pain level controlled Vital Signs Assessment: post-procedure vital signs reviewed and stable Respiratory status: spontaneous breathing, respiratory function stable and patient connected to nasal cannula oxygen Cardiovascular status: blood pressure returned to baseline and stable Postop Assessment: no headache and no backache Anesthetic complications: no    Last Vitals:  Filed Vitals:   08/23/15 1245 08/23/15 1300  BP: 120/70 132/78  Pulse: 64 63  Temp:    Resp: 19 20    Last Pain: There were no vitals filed for this visit.               [place]

## 2015-08-23 NOTE — Op Note (Signed)
Pre-operative diagnosis- Osteoarthritis  Left knee(s)  Post-operative diagnosis- Osteoarthritis Left knee(s)  Procedure-  Left  Total Knee Arthroplasty  Surgeon- Dione Plover. Kimberlyann Hollar, MD  Assistant- Ardeen Jourdain, PA-C   Anesthesia-  Spinal  EBL-* No blood loss amount entered *   Drains Hemovac  Tourniquet time-  Total Tourniquet Time Documented: Thigh (Left) - 35 minutes Total: Thigh (Left) - 35 minutes     Complications- None  Condition-PACU - hemodynamically stable.   Brief Clinical Note  Ann Maxwell is a 75 y.o. year old female with end stage OA of her left knee with progressively worsening pain and dysfunction. She has constant pain, with activity and at rest and significant functional deficits with difficulties even with ADLs. She has had extensive non-op management including analgesics, injections of cortisone and viscosupplements, and home exercise program, but remains in significant pain with significant dysfunction. Radiographs show bone on bone arthritis medial and patellofemoral. She presents now for left Total Knee Arthroplasty.    Procedure in detail---   The patient is brought into the operating room and positioned supine on the operating table. After successful administration of  Spinal,   a tourniquet is placed high on the  Left thigh(s) and the lower extremity is prepped and draped in the usual sterile fashion. Time out is performed by the operating team and then the  Left lower extremity is wrapped in Esmarch, knee flexed and the tourniquet inflated to 300 mmHg.       A midline incision is made with a ten blade through the subcutaneous tissue to the level of the extensor mechanism. A fresh blade is used to make a medial parapatellar arthrotomy. Soft tissue over the proximal medial tibia is subperiosteally elevated to the joint line with a knife and into the semimembranosus bursa with a Cobb elevator. Soft tissue over the proximal lateral tibia is elevated with  attention being paid to avoiding the patellar tendon on the tibial tubercle. The patella is everted, knee flexed 90 degrees and the ACL and PCL are removed. Findings are bone on bone medial and patellofemoral with large global osteophytes.        The drill is used to create a starting hole in the distal femur and the canal is thoroughly irrigated with sterile saline to remove the fatty contents. The 5 degree Left  valgus alignment guide is placed into the femoral canal and the distal femoral cutting block is pinned to remove 10 mm off the distal femur. Resection is made with an oscillating saw.      The tibia is subluxed forward and the menisci are removed. The extramedullary alignment guide is placed referencing proximally at the medial aspect of the tibial tubercle and distally along the second metatarsal axis and tibial crest. The block is pinned to remove 73mm off the more deficient medial  side. Resection is made with an oscillating saw. Size 4is the most appropriate size for the tibia and the proximal tibia is prepared with the modular drill and keel punch for that size.      The femoral sizing guide is placed and size 4 is most appropriate. Rotation is marked off the epicondylar axis and confirmed by creating a rectangular flexion gap at 90 degrees. The size 4 cutting block is pinned in this rotation and the anterior, posterior and chamfer cuts are made with the oscillating saw. The intercondylar block is then placed and that cut is made.      Trial size 4 tibial component,  trial size 4 posterior stabilized femur and a 12.5  mm posterior stabilized rotating platform insert trial is placed. Full extension is achieved with excellent varus/valgus and anterior/posterior balance throughout full range of motion. The patella is everted and thickness measured to be 22  mm. Free hand resection is taken to 12 mm, a 38 template is placed, lug holes are drilled, trial patella is placed, and it tracks normally.  Osteophytes are removed off the posterior femur with the trial in place. All trials are removed and the cut bone surfaces prepared with pulsatile lavage. Cement is mixed and once ready for implantation, the size 4 tibial implant, size  4 posterior stabilized femoral component, and the size 38 patella are cemented in place and the patella is held with the clamp. The trial insert is placed and the knee held in full extension. The Exparel (20 ml mixed with 30 ml saline) and .25% Bupivicaine, are injected into the extensor mechanism, posterior capsule, medial and lateral gutters and subcutaneous tissues.  All extruded cement is removed and once the cement is hard the permanent 12.5 mm posterior stabilized rotating platform insert is placed into the tibial tray.      The wound is copiously irrigated with saline solution and the extensor mechanism closed over a hemovac drain with #1 V-loc suture. The tourniquet is released for a total tourniquet time of 35  minutes. Flexion against gravity is 140 degrees and the patella tracks normally. Subcutaneous tissue is closed with 2.0 vicryl and subcuticular with running 4.0 Monocryl. The incision is cleaned and dried and steri-strips and a bulky sterile dressing are applied. The limb is placed into a knee immobilizer and the patient is awakened and transported to recovery in stable condition.      Please note that a surgical assistant was a medical necessity for this procedure in order to perform it in a safe and expeditious manner. Surgical assistant was necessary to retract the ligaments and vital neurovascular structures to prevent injury to them and also necessary for proper positioning of the limb to allow for anatomic placement of the prosthesis.   Dione Plover Selinda Korzeniewski, MD    08/23/2015, 12:05 PM

## 2015-08-23 NOTE — Interval H&P Note (Signed)
History and Physical Interval Note:  08/23/2015 10:16 AM  Ann Maxwell  has presented today for surgery, with the diagnosis of LEFT KNEE OA  The various methods of treatment have been discussed with the patient and family. After consideration of risks, benefits and other options for treatment, the patient has consented to  Procedure(s): TOTAL KNEE ARTHROPLASTY (Left) as a surgical intervention .  The patient's history has been reviewed, patient examined, no change in status, stable for surgery.  I have reviewed the patient's chart and labs.  Questions were answered to the patient's satisfaction.     Gearlean Alf

## 2015-08-24 LAB — CBC
HCT: 35.2 % — ABNORMAL LOW (ref 36.0–46.0)
HEMOGLOBIN: 11.5 g/dL — AB (ref 12.0–15.0)
MCH: 28.8 pg (ref 26.0–34.0)
MCHC: 32.7 g/dL (ref 30.0–36.0)
MCV: 88.2 fL (ref 78.0–100.0)
Platelets: 233 10*3/uL (ref 150–400)
RBC: 3.99 MIL/uL (ref 3.87–5.11)
RDW: 13 % (ref 11.5–15.5)
WBC: 14.8 10*3/uL — ABNORMAL HIGH (ref 4.0–10.5)

## 2015-08-24 LAB — BASIC METABOLIC PANEL
Anion gap: 6 (ref 5–15)
BUN: 14 mg/dL (ref 6–20)
CHLORIDE: 104 mmol/L (ref 101–111)
CO2: 28 mmol/L (ref 22–32)
Calcium: 8.5 mg/dL — ABNORMAL LOW (ref 8.9–10.3)
Creatinine, Ser: 0.73 mg/dL (ref 0.44–1.00)
GFR calc non Af Amer: >60 mL/min (ref >60)
Glucose, Bld: 133 mg/dL — ABNORMAL HIGH (ref 65–99)
POTASSIUM: 4.1 mmol/L (ref 3.5–5.1)
SODIUM: 138 mmol/L (ref 135–145)

## 2015-08-24 MED ORDER — RIVAROXABAN 10 MG PO TABS: 10.0000 mg | ORAL_TABLET | Freq: Every day | ORAL | Status: DC

## 2015-08-24 MED ORDER — TRAMADOL HCL 50 MG PO TABS: 50.0000 mg | ORAL_TABLET | Freq: Four times a day (QID) | ORAL | Status: DC | PRN

## 2015-08-24 MED ORDER — METHOCARBAMOL 500 MG PO TABS: 500.0000 mg | ORAL_TABLET | Freq: Four times a day (QID) | ORAL | Status: DC | PRN

## 2015-08-24 MED ORDER — OXYCODONE HCL 5 MG PO TABS: 5.0000 mg | ORAL_TABLET | ORAL | Status: DC | PRN

## 2015-08-24 NOTE — Progress Notes (Signed)
   Subjective: 1 Day Post-Op Procedure(s) (LRB): TOTAL KNEE ARTHROPLASTY (Left) Patient reports pain as mild and moderate last night. Patient seen in rounds with Dr. Wynelle Link. Patient is well, but has had some minor complaints of pain in the knee, requiring pain medications We will start therapy today.  Plan is to go Home after hospital stay.  Objective: Vital signs in last 24 hours: Temp:  [97.5 F (36.4 C)-98.1 F (36.7 C)] 97.5 F (36.4 C) (05/09 [id number]) Pulse Rate:  [62-109] 62 (05/09 0608) Resp:  [15-24] 15 (05/09 0608) BP: (107-139)/(52-97) 107/56 mmHg (05/09 0608) SpO2:  [94 %-100 %] 98 % (05/09 0608) Weight:  [95.074 kg (209 lb 9.6 oz)] 95.074 kg (209 lb 9.6 oz) (05/08 0844)  Intake/Output from previous day:  Intake/Output Summary (Last 24 hours) at 08/24/15 0812 Last data filed at 08/24/15 0609  Gross per 24 hour  Intake   4890 ml  Output   1250 ml  Net   3640 ml    Intake/Output this shift:    Labs:  Recent Labs  08/24/15 0426  HGB 11.5*    Recent Labs  08/24/15 0426  WBC 14.8*  RBC 3.99  HCT 35.2*  PLT 233    Recent Labs  08/24/15 0426  NA 138  K 4.1  CL 104  CO2 28  BUN 14  CREATININE 0.73  GLUCOSE 133*  CALCIUM 8.5*   No results for input(s): LABPT, INR in the last 72 hours.  EXAM General - Patient is Alert, Appropriate and Oriented Extremity - Neurovascular intact Sensation intact distally Dorsiflexion/Plantar flexion intact Dressing - dressing C/D/I Motor Function - intact, moving foot and toes well on exam.  Hemovac pulled without difficulty.  Past Medical History  Diagnosis Date  . Asthma   . Hypertension   . Obesity   . Environmental allergies   . Vertigo     no recent issues  . GERD (gastroesophageal reflux disease)     heartburn occasionally  . Internal hemorrhoids     remains an issue- no bright bleeding seen recent  . Transfusion history     2 yrs ago due to anemia  . Anemia     iron infusion -last done 1  month ago(12-15, and 12-22 -64 [facility])- Dr. Lindi Adie  . Iron deficiency anemia   . History of kidney stones   . Osteoarthritis     arthritis- knees and shoulders  . Depression   . Breast CA ([facility]) 2013    a. L breast DCIS, s/p L mastectomy 12/2011. no further issues  . Colon cancer ([facility])     Assessment/Plan: 1 Day Post-Op Procedure(s) (LRB): TOTAL KNEE ARTHROPLASTY (Left) Principal Problem:   OA (osteoarthritis) of knee  Estimated body mass index is 35.44 kg/(m^2) as calculated from the following:   Height as of this encounter: 5' 4.5" (1.638 m).   Weight as of this encounter: 95.074 kg (209 lb 9.6 oz). Advance diet Up with therapy Plan for discharge tomorrow Discharge home with home health  DVT Prophylaxis - Xarelto Weight-Bearing as tolerated to left leg D/C O2 and Pulse OX and try on Room Air  [name], PA-C Orthopaedic Surgery 08/24/2015, 8:12 AM

## 2015-08-24 NOTE — Discharge Instructions (Addendum)
° °Dr. Frank Aluisio °Total Joint Specialist °[facility] °[address] °[place], [place] [address] °[phone] ° °TOTAL KNEE REPLACEMENT POSTOPERATIVE DIRECTIONS ° °Knee Rehabilitation, Guidelines Following Surgery  °Results after knee surgery are often greatly improved when you follow the exercise, range of motion and muscle strengthening exercises prescribed by your doctor. Safety measures are also important to protect the knee from further injury. Any time any of these exercises cause you to have increased pain or swelling in your knee joint, decrease the amount until you are comfortable again and slowly increase them. If you have problems or questions, call your caregiver or physical therapist for advice.  ° °HOME CARE INSTRUCTIONS  °Remove items at home which could result in a fall. This includes throw rugs or furniture in walking pathways.  °· ICE to the affected knee every three hours for 30 minutes at a time and then as needed for pain and swelling.  Continue to use ice on the knee for pain and swelling from surgery. You may notice swelling that will progress down to the foot and ankle.  This is normal after surgery.  Elevate the leg when you are not up walking on it.   °· Continue to use the breathing machine which will help keep your temperature down.  It is common for your temperature to cycle up and down following surgery, especially at night when you are not up moving around and exerting yourself.  The breathing machine keeps your lungs expanded and your temperature down. °· Do not place pillow under knee, focus on keeping the knee straight while resting ° °DIET °You may resume your previous home diet once your are discharged from the hospital. ° °DRESSING / WOUND CARE / SHOWERING °You may shower 3 days after surgery, but keep the wounds dry during showering.  You may use an occlusive plastic wrap (Press'n Seal for example), NO SOAKING/SUBMERGING IN THE BATHTUB.  If the  bandage gets wet, change with a clean dry gauze.  If the incision gets wet, pat the wound dry with a clean towel. °You may start showering once you are discharged home but do not submerge the incision under water. Just pat the incision dry and apply a dry gauze dressing on daily. °Change the surgical dressing daily and reapply a dry dressing each time. ° °ACTIVITY °Walk with your walker as instructed. °Use walker as long as suggested by your caregivers. °Avoid periods of inactivity such as sitting longer than an hour when not asleep. This helps prevent blood clots.  °You may resume a sexual relationship in one month or when given the OK by your doctor.  °You may return to work once you are cleared by your doctor.  °Do not drive a car for 6 weeks or until released by you surgeon.  °Do not drive while taking narcotics. ° °WEIGHT BEARING °Weight bearing as tolerated with assist device (walker, cane, etc) as directed, use it as long as suggested by your surgeon or therapist, typically at least 4-6 weeks. ° °POSTOPERATIVE CONSTIPATION PROTOCOL °Constipation - defined medically as fewer than three stools per week and severe constipation as less than one stool per week. ° °One of the most common issues patients have following surgery is constipation.  Even if you have a regular bowel pattern at home, your normal regimen is likely to be disrupted due to multiple reasons following surgery.  Combination of anesthesia, postoperative narcotics, change in appetite and fluid intake all can affect your bowels.    In order to avoid complications following surgery, here are some recommendations in order to help you during your recovery period. ° °Colace (docusate) - Pick up an over-the-counter form of Colace or another stool softener and take twice a day as long as you are requiring postoperative pain medications.  Take with a full glass of water daily.  If you experience loose stools or diarrhea, hold the colace until you stool forms  back up.  If your symptoms do not get better within 1 week or if they get worse, check with your doctor. ° °Dulcolax (bisacodyl) - Pick up over-the-counter and take as directed by the product packaging as needed to assist with the movement of your bowels.  Take with a full glass of water.  Use this product as needed if not relieved by Colace only.  ° °MiraLax (polyethylene glycol) - Pick up over-the-counter to have on hand.  MiraLax is a solution that will increase the amount of water in your bowels to assist with bowel movements.  Take as directed and can mix with a glass of water, juice, soda, coffee, or tea.  Take if you go more than two days without a movement. °Do not use MiraLax more than once per day. Call your doctor if you are still constipated or irregular after using this medication for 7 days in a row. ° °If you continue to have problems with postoperative constipation, please contact the office for further assistance and recommendations.  If you experience "the worst abdominal pain ever" or develop nausea or vomiting, please contact the office immediatly for further recommendations for treatment. ° °ITCHING ° If you experience itching with your medications, try taking only a single pain pill, or even half a pain pill at a time.  You can also use Benadryl over the counter for itching or also to help with sleep.  ° °TED HOSE STOCKINGS °Wear the elastic stockings on both legs for three weeks following surgery during the day but you may remove then at night for sleeping. ° °MEDICATIONS °See your medication summary on the “After Visit Summary” that the nursing staff will review with you prior to discharge.  You may have some home medications which will be placed on hold until you complete the course of blood thinner medication.  It is important for you to complete the blood thinner medication as prescribed by your surgeon.  Continue your approved medications as instructed at time of  discharge. ° °PRECAUTIONS °If you experience chest pain or shortness of breath - call 911 immediately for transfer to the hospital emergency department.  °If you develop a fever greater that 101 F, purulent drainage from wound, increased redness or drainage from wound, foul odor from the wound/dressing, or calf pain - CONTACT YOUR SURGEON.   °                                                °FOLLOW-UP APPOINTMENTS °Make sure you keep all of your appointments after your operation with your surgeon and caregivers. You should call the office at the above phone number and make an appointment for approximately two weeks after the date of your surgery or on the date instructed by your surgeon outlined in the "After Visit Summary". ° ° °RANGE OF MOTION AND STRENGTHENING EXERCISES  °Rehabilitation of the knee is important following a knee injury or   an operation. After just a few days of immobilization, the muscles of the thigh which control the knee become weakened and shrink (atrophy). Knee exercises are designed to build up the tone and strength of the thigh muscles and to improve knee motion. Often times heat used for twenty to thirty minutes before working out will loosen up your tissues and help with improving the range of motion but do not use heat for the first two weeks following surgery. These exercises can be done on a training (exercise) mat, on the floor, on a table or on a bed. Use what ever works the best and is most comfortable for you Knee exercises include:  °Leg Lifts - While your knee is still immobilized in a splint or cast, you can do straight leg raises. Lift the leg to 60 degrees, hold for 3 sec, and slowly lower the leg. Repeat 10-20 times 2-3 times daily. Perform this exercise against resistance later as your knee gets better.  °Quad and Hamstring Sets - Tighten up the muscle on the front of the thigh (Quad) and hold for 5-10 sec. Repeat this 10-20 times hourly. Hamstring sets are done by pushing the  foot backward against an object and holding for 5-10 sec. Repeat as with quad sets.  °· Leg Slides: Lying on your back, slowly slide your foot toward your buttocks, bending your knee up off the floor (only go as far as is comfortable). Then slowly slide your foot back down until your leg is flat on the floor again. °· Angel Wings: Lying on your back spread your legs to the side as far apart as you can without causing discomfort.  °A rehabilitation program following serious knee injuries can speed recovery and prevent re-injury in the future due to weakened muscles. Contact your doctor or a physical therapist for more information on knee rehabilitation.  ° °IF YOU ARE TRANSFERRED TO A SKILLED REHAB FACILITY °If the patient is transferred to a skilled rehab facility following release from the hospital, a list of the current medications will be sent to the facility for the patient to continue.  When discharged from the skilled rehab facility, please have the facility set up the patient's Home Health Physical Therapy prior to being released. Also, the skilled facility will be responsible for providing the patient with their medications at time of release from the facility to include their pain medication, the muscle relaxants, and their blood thinner medication. If the patient is still at the rehab facility at time of the two week follow up appointment, the skilled rehab facility will also need to assist the patient in arranging follow up appointment in our office and any transportation needs. ° °MAKE SURE YOU:  °Understand these instructions.  °Get help right away if you are not doing well or get worse.  ° ° °Pick up stool softner and laxative for home use following surgery while on pain medications. °Do not submerge incision under water. °Please use good hand washing techniques while changing dressing each day. °May shower starting three days after surgery. °Please use a clean towel to pat the incision dry following  showers. °Continue to use ice for pain and swelling after surgery. °Do not use any lotions or creams on the incision until instructed by your surgeon. ° °Take Xarelto for two and a half more weeks, then discontinue Xarelto. °Once the patient has completed the Xarelto, they may resume the 81 mg Aspirin. ° °Information on my medicine - XARELTO® (Rivaroxaban) ° °This   medication education was reviewed with me or my healthcare representative as part of my discharge preparation.  The pharmacist that spoke with me during my hospital stay was:  Cheral Almas, Stu-PharmD  Why was Xarelto prescribed for you? Xarelto was prescribed for you to reduce the risk of blood clots forming after orthopedic surgery. The medical term for these abnormal blood clots is venous thromboembolism (VTE).  What do you need to know about xarelto ? Take your Xarelto ONCE DAILY at the same time every day. You may take it either with or without food.  If you have difficulty swallowing the tablet whole, you may crush it and mix in applesauce just prior to taking your dose.  Take Xarelto exactly as prescribed by your doctor and DO NOT stop taking Xarelto without talking to the doctor who prescribed the medication.  Stopping without other VTE prevention medication to take the place of Xarelto may increase your risk of developing a clot.  After discharge, you should have regular check-up appointments with your healthcare provider that is prescribing your Xarelto.    What do you do if you miss a dose? If you miss a dose, take it as soon as you remember on the same day then continue your regularly scheduled once daily regimen the next day. Do not take two doses of Xarelto on the same day.   Important Safety Information A possible side effect of Xarelto is bleeding. You should call your healthcare provider right away if you experience any of the following: ? Bleeding from an injury or your nose that does not stop. ? Unusual  colored urine (red or dark brown) or unusual colored stools (red or black). ? Unusual bruising for unknown reasons. ? A serious fall or if you hit your head (even if there is no bleeding).  Some medicines may interact with Xarelto and might increase your risk of bleeding while on Xarelto. To help avoid this, consult your healthcare provider or pharmacist prior to using any new prescription or non-prescription medications, including herbals, vitamins, non-steroidal anti-inflammatory drugs (NSAIDs) and supplements.  This website has more information on Xarelto: [email or web address]

## 2015-08-24 NOTE — Progress Notes (Signed)
Pt does not want foley catheter removed at this time. She would like it removed prior to physical therapy.

## 2015-08-24 NOTE — Evaluation (Signed)
Occupational Therapy Evaluation and Discharge Patient Details Name: Ann Maxwell MRN: [id number] DOB: Sep 18, 1940 Today's Date: 08/24/2015    History of Present Illness 75 yo female s/p L TKA 08/23/15   Clinical Impression   Pt was performing at a modified independent level in ADL and mobility prior to admission. Presents with post operative pain, generalized weakness and impaired standing balance interfering with ability to function at her baseline. Educated pt in use of DME and AE for ADL and home safety. Pt overall requiring minimum assistance and will have capable assistance at home. No further OT needs.    Follow Up Recommendations  No OT follow up    Equipment Recommendations  3 in 1 bedside comode    Recommendations for Other Services       Precautions / Restrictions Precautions Precautions: Fall Required Braces or Orthoses: Knee Immobilizer - Left Knee Immobilizer - Left: Discontinue once straight leg raise with < 10 degree lag Restrictions Weight Bearing Restrictions: No LLE Weight Bearing: Weight bearing as tolerated      Mobility Bed Mobility               General bed mobility comments: oob in recliner  Transfers Overall transfer level: Needs assistance Equipment used: Rolling walker (2 wheeled) Transfers: Sit to/from Stand Sit to Stand: Min assist         General transfer comment: verbal cues and assist for managing L LE    Balance                                            ADL Overall ADL's : Needs assistance/impaired Eating/Feeding: Independent;Sitting   Grooming: Wash/dry hands;Sitting;Set up   Upper Body Bathing: Set up;Sitting   Lower Body Bathing: Minimal assistance;Sit to/from stand   Upper Body Dressing : Set up;Sitting   Lower Body Dressing: Minimal assistance;Sit to/from stand   Toilet Transfer: Min guard;Ambulation;RW;BSC   Toileting- Clothing Manipulation and Hygiene: Modified  independent;Sitting/lateral lean       Functional mobility during ADLs: Min guard;Rolling walker General ADL Comments: Educated pt in use of AE for LB bathing and dressing, transporting items safely with walker and in shower transfer. Educated pt in multiple uses of 3 in 1 and in home safety, fall prevention.      Vision     Perception     Praxis      Pertinent Vitals/Pain Pain Assessment: 0-10 Pain Score: 5  Pain Location: L knee Pain Descriptors / Indicators: Sore;Operative site guarding Pain Intervention(s): Monitored during session;Repositioned;RN gave pain meds during session;Ice applied     Hand Dominance Right   Extremity/Trunk Assessment Upper Extremity Assessment Upper Extremity Assessment: Overall WFL for tasks assessed   Lower Extremity Assessment Lower Extremity Assessment: Defer to PT evaluation LLE: Unable to fully assess due to pain   Cervical / Trunk Assessment Cervical / Trunk Assessment: Normal   Communication Communication Communication: No difficulties   Cognition Arousal/Alertness: Awake/alert Behavior During Therapy: WFL for tasks assessed/performed Overall Cognitive Status: Within Functional Limits for tasks assessed                     General Comments       Exercises       Shoulder Instructions      Home Living Family/patient expects to be discharged to:: Private residence Living Arrangements: Alone (going to a friend's  home) Available Help at Discharge: Friend(s);Available 24 hours/day Type of Home: Apartment Home Access: Stairs to enter Entrance Stairs-Number of Steps: 1   Home Layout: One level     Bathroom Shower/Tub: Occupational psychologist: Standard     Home Equipment: Environmental consultant - 2 wheels;Walker - 4 wheels;Adaptive equipment Adaptive Equipment: Reacher        Prior Functioning/Environment Level of Independence: Independent with assistive device(s)        Comments: was using a walker prior to sx     OT Diagnosis: Generalized weakness;Acute pain   OT Problem List:     OT Treatment/Interventions:      OT Goals(Current goals can be found in the care plan section) Acute Rehab OT Goals Patient Stated Goal: home tomorrow  OT Frequency:     Barriers to D/C:            Co-evaluation              End of Session Equipment Utilized During Treatment: Gait belt;Rolling walker;Left knee immobilizer  Activity Tolerance: Patient tolerated treatment well Patient left: in chair;with call bell/phone within reach;with family/visitor present;with nursing/sitter in room   Time: 1310-1341 OT Time Calculation (min): 31 min Charges:  OT General Charges $OT Visit: 1 Procedure OT Evaluation $OT Eval Low Complexity: 1 Procedure OT Treatments $Self Care/Home Management : 8-22 mins G-Codes:    Malka So 08/24/2015, 1:48 PM  [phone]

## 2015-08-24 NOTE — Evaluation (Signed)
Physical Therapy Evaluation Patient Details Name: HILDEGARDE EMPEY MRN: [id number] DOB: 03/30/1941 Today's Date: 08/24/2015   History of Present Illness  75 yo female s/p L TKA 08/23/15  Clinical Impression  On eval, pt required Min assist for mobility-walked ~40 feet with RW. Pain rated 7/10. Will follow and progress activity as tolerated.     Follow Up Recommendations Home health PT;Supervision/Assistance - 24 hour    Equipment Recommendations  None recommended by PT    Recommendations for Other Services       Precautions / Restrictions Precautions Precautions: Fall Required Braces or Orthoses: Knee Immobilizer - Left Knee Immobilizer - Left: Discontinue once straight leg raise with < 10 degree lag Restrictions Weight Bearing Restrictions: No LLE Weight Bearing: Weight bearing as tolerated      Mobility  Bed Mobility               General bed mobility comments: oob in recliner  Transfers Overall transfer level: Needs assistance Equipment used: Rolling walker (2 wheeled) Transfers: Sit to/from Stand Sit to Stand: Min assist         General transfer comment: Assist to rise, stabilize, control descent VCSs safety, technique, hand/LE placement  Ambulation/Gait Ambulation/Gait assistance: Min guard Ambulation Distance (Feet): 40 Feet Assistive device: Rolling walker (2 wheeled) Gait Pattern/deviations: Step-to pattern     General Gait Details: VCs safety, technique, sequence. slow gait speed. Pt fatigues easily.   Stairs            Wheelchair Mobility    Modified Rankin (Stroke Patients Only)       Balance                                             Pertinent Vitals/Pain Pain Assessment: 0-10 Pain Score: 7  Pain Location: L knee Pain Descriptors / Indicators: Sore;Aching Pain Intervention(s): Monitored during session;Ice applied;Repositioned    Home Living Family/patient expects to be discharged to:: Private  residence Living Arrangements: Alone (going to a friend's home) Available Help at Discharge: Friend(s) Type of Home: Apartment Home Access: Stairs to enter   [employer] of Steps: 1 Home Layout: One level Home Equipment: [place] - 2 wheels;Walker - 4 wheels      Prior Function Level of Independence: Independent               Hand Dominance        Extremity/Trunk Assessment   Upper Extremity Assessment: Defer to OT evaluation           Lower Extremity Assessment: LLE deficits/detail      Cervical / Trunk Assessment: Normal  Communication   Communication: No difficulties  Cognition Arousal/Alertness: Awake/alert Behavior During Therapy: WFL for tasks assessed/performed Overall Cognitive Status: Within Functional Limits for tasks assessed                      General Comments      Exercises        Assessment/Plan    PT Assessment Patient needs continued PT services  PT Diagnosis Difficulty walking   PT Problem List Decreased strength;Decreased range of motion;Decreased activity tolerance;Decreased balance;Decreased mobility;Decreased knowledge of use of DME;Pain  PT Treatment Interventions DME instruction;Gait training;Functional mobility training;Therapeutic activities;Patient/family education;Balance training;Therapeutic exercise;Stair training   PT Goals (Current goals can be found in the Care Plan section) Acute Rehab PT Goals Patient  Stated Goal: home tomorrow Time For Goal Achievement: 08/31/15 Potential to Achieve Goals: Good    Frequency 7X/week   Barriers to discharge        Co-evaluation               End of Session Equipment Utilized During Treatment: Gait belt;Left knee immobilizer Activity Tolerance: Patient limited by fatigue;Patient limited by pain Patient left: in chair;with call bell/phone within reach;with family/visitor present           Time: [id number] PT Time Calculation (min) (ACUTE ONLY):  10 min   Charges:   PT Evaluation $PT Eval Low Complexity: 1 Procedure     PT G Codes:        Weston Anna, MPT Pager: [phone]

## 2015-08-24 NOTE — Care Management Note (Signed)
Case Management Note  Patient Details  Name: MADY BREAUX MRN: [id number] Date of Birth: 01-10-1941  Subjective/Objective:  S/p Left Total Knee Arthroplasty                  Action/Plan: Discharge planning, spoke with patient and daughter at bedside. Have chosen [facility]. Contacted [facility] for referral. Has RW, needs 3-n-1, contacted [place] to deliver to room  Expected Discharge Date:  08/25/15               Expected Discharge Plan:  [place]  In-House Referral:  NA  Discharge planning Services  CM Consult  Post Acute Care Choice:  Home Health Choice offered to:  Patient  DME Arranged:  3-N-1 DME Agency:  [place]:  PT [place]:  [place]  Status of Service:  Completed, signed off  Medicare Important Message Given:    Date Medicare IM Given:    Medicare IM give by:    Date Additional Medicare IM Given:    Additional Medicare Important Message give by:     If discussed at [place] of Stay Meetings, dates discussed:    Additional Comments:  Guadalupe Maple, RN 08/24/2015, 12:05 PM [phone]

## 2015-08-24 NOTE — Discharge Summary (Signed)
Physician Discharge Summary   Patient ID: Ann Maxwell MRN: [id number] DOB/AGE: [birth year] 75 y.o.  Admit date: 08/23/2015 Discharge date: 08-25-15  Primary Diagnosis:  Osteoarthritis Left knee(s)  Admission Diagnoses:  Past Medical History  Diagnosis Date  . Asthma   . Hypertension   . Obesity   . Environmental allergies   . Vertigo     no recent issues  . GERD (gastroesophageal reflux disease)     heartburn occasionally  . Internal hemorrhoids     remains an issue- no bright bleeding seen recent  . Transfusion history     2 yrs ago due to anemia  . Anemia     iron infusion -last done 1 month ago(12-15, and 12-22 -43 [facility])- Dr. Lindi Adie  . Iron deficiency anemia   . History of kidney stones   . Osteoarthritis     arthritis- knees and shoulders  . Depression   . Breast CA ([place]) 2013    a. L breast DCIS, s/p L mastectomy 12/2011. no further issues  . Colon cancer [facility])    Discharge Diagnoses:   Principal Problem:   OA (osteoarthritis) of knee  Estimated body mass index is 35.44 kg/(m^2) as calculated from the following:   Height as of this encounter: 5' 4.5" (1.638 m).   Weight as of this encounter: 95.074 kg (209 lb 9.6 oz).  Procedure:  Procedure(s) (LRB): TOTAL KNEE ARTHROPLASTY (Left)   Consults: None  HPI: Ann Maxwell is a 75 y.o. year old female with end stage OA of her left knee with progressively worsening pain and dysfunction. She has constant pain, with activity and at rest and significant functional deficits with difficulties even with ADLs. She has had extensive non-op management including analgesics, injections of cortisone and viscosupplements, and home exercise program, but remains in significant pain with significant dysfunction. Radiographs show bone on bone arthritis medial and patellofemoral. She presents now for left Total Knee Arthroplasty.   Laboratory Data: Admission on 08/23/2015  Component Date Value Ref Range Status  . ABO/RH(D)  08/23/2015 O POS   Final  . Antibody Screen 08/23/2015 POS   Final  . Sample Expiration 08/23/2015 08/26/2015   Final  . DAT, IgG 08/23/2015 NEG   Final  . Unit Number 08/23/2015 [id number]   Final  . Blood Component Type 08/23/2015 RBC LR PHER1   Final  . Unit division 08/23/2015 00   Final  . Status of Unit 08/23/2015 ALLOCATED   Final  . Donor AG Type 08/23/2015 NEGATIVE FOR E ANTIGEN   Final  . Transfusion Status 08/23/2015 OK TO TRANSFUSE   Final  . Crossmatch Result 08/23/2015 COMPATIBLE   Final  . WBC 08/24/2015 14.8* 4.0 - 10.5 K/uL Final  . RBC 08/24/2015 3.99  3.87 - 5.11 MIL/uL Final  . Hemoglobin 08/24/2015 11.5* 12.0 - 15.0 g/dL Final  . HCT 08/24/2015 35.2* 36.0 - 46.0 % Final  . MCV 08/24/2015 88.2  78.0 - 100.0 fL Final  . MCH 08/24/2015 28.8  26.0 - 34.0 pg Final  . MCHC 08/24/2015 32.7  30.0 - 36.0 g/dL Final  . RDW 08/24/2015 13.0  11.5 - 15.5 % Final  . Platelets 08/24/2015 233  150 - 400 K/uL Final  . Sodium 08/24/2015 138  135 - 145 mmol/L Final  . Potassium 08/24/2015 4.1  3.5 - 5.1 mmol/L Final  . Chloride 08/24/2015 104  101 - 111 mmol/L Final  . CO2 08/24/2015 28  22 - 32 mmol/L Final  . Glucose,  Bld 08/24/2015 133* 65 - 99 mg/dL Final  . BUN 08/24/2015 14  6 - 20 mg/dL Final  . Creatinine, Ser 08/24/2015 0.73  0.44 - 1.00 mg/dL Final  . Calcium 08/24/2015 8.5* 8.9 - 10.3 mg/dL Final  . GFR calc non Af Amer 08/24/2015 >60  >60 mL/min Final  . GFR calc Af Amer 08/24/2015 >60  >60 mL/min Final   Comment: (NOTE) The eGFR has been calculated using the CKD EPI equation. This calculation has not been validated in all clinical situations. eGFR's persistently <60 mL/min signify possible Chronic Kidney Disease.   Georgiann Hahn gap 08/24/2015 6  5 - 15 Final  Hospital Outpatient Visit on 08/13/2015  Component Date Value Ref Range Status  . MRSA, PCR 08/13/2015 NEGATIVE  NEGATIVE Final  . Staphylococcus aureus 08/13/2015 NEGATIVE  NEGATIVE Final   Comment:          The Xpert SA Assay (FDA approved for NASAL specimens in patients over 54 years of age), is one component of a comprehensive surveillance program.  Test performance has been validated by [facility] for patients greater than or equal to 87 year old. It is not intended to diagnose infection nor to guide or monitor treatment.   Hospital Outpatient Visit on 08/11/2015  Component Date Value Ref Range Status  . aPTT 08/11/2015 27  24 - 37 seconds Final  . WBC 08/11/2015 18.8* 4.0 - 10.5 K/uL Final  . RBC 08/11/2015 5.40* 3.87 - 5.11 MIL/uL Final  . Hemoglobin 08/11/2015 15.7* 12.0 - 15.0 g/dL Final  . HCT 08/11/2015 47.8* 36.0 - 46.0 % Final  . MCV 08/11/2015 88.5  78.0 - 100.0 fL Final  . MCH 08/11/2015 29.1  26.0 - 34.0 pg Final  . MCHC 08/11/2015 32.8  30.0 - 36.0 g/dL Final  . RDW 08/11/2015 13.7  11.5 - 15.5 % Final  . Platelets 08/11/2015 278  150 - 400 K/uL Final  . Sodium 08/11/2015 140  135 - 145 mmol/L Final  . Potassium 08/11/2015 4.6  3.5 - 5.1 mmol/L Final  . Chloride 08/11/2015 101  101 - 111 mmol/L Final  . CO2 08/11/2015 27  22 - 32 mmol/L Final  . Glucose, Bld 08/11/2015 114* 65 - 99 mg/dL Final  . BUN 08/11/2015 10  6 - 20 mg/dL Final  . Creatinine, Ser 08/11/2015 0.80  0.44 - 1.00 mg/dL Final  . Calcium 08/11/2015 9.7  8.9 - 10.3 mg/dL Final  . Total Protein 08/11/2015 7.7  6.5 - 8.1 g/dL Final  . Albumin 08/11/2015 4.3  3.5 - 5.0 g/dL Final  . AST 08/11/2015 29  15 - 41 U/L Final  . ALT 08/11/2015 22  14 - 54 U/L Final  . Alkaline Phosphatase 08/11/2015 94  38 - 126 U/L Final  . Total Bilirubin 08/11/2015 0.3  0.3 - 1.2 mg/dL Final  . GFR calc non Af Amer 08/11/2015 >60  >60 mL/min Final  . GFR calc Af Amer 08/11/2015 >60  >60 mL/min Final   Comment: (NOTE) The eGFR has been calculated using the CKD EPI equation. This calculation has not been validated in all clinical situations. eGFR's persistently <60 mL/min signify possible Chronic Kidney Disease.   .  Anion gap 08/11/2015 12  5 - 15 Final  . Prothrombin Time 08/11/2015 14.3  11.6 - 15.2 seconds Final  . INR 08/11/2015 1.09  0.00 - 1.49 Final  . ABO/RH(D) 08/11/2015 O POS   Final  . Antibody Screen 08/11/2015 POS   Final  .  Sample Expiration 08/11/2015 08/14/2015   Final  . Extend sample reason 08/11/2015 NO TRANSFUSIONS OR PREGNANCY IN THE PAST 3 MONTHS   Final  . Antibody Identification 91/91/6606 ANTI E   Final  . DAT, IgG 08/11/2015 NEG   Final  . Color, Urine 08/11/2015 ORANGE* YELLOW Final   BIOCHEMICALS MAY BE AFFECTED BY COLOR  . APPearance 08/11/2015 CLEAR  CLEAR Final  . Specific Gravity, Urine 08/11/2015 1.009  1.005 - 1.030 Final  . pH 08/11/2015 5.0  5.0 - 8.0 Final  . Glucose, UA 08/11/2015 NEGATIVE  NEGATIVE mg/dL Final  . Hgb urine dipstick 08/11/2015 MODERATE* NEGATIVE Final  . Bilirubin Urine 08/11/2015 NEGATIVE  NEGATIVE Final  . Ketones, ur 08/11/2015 NEGATIVE  NEGATIVE mg/dL Final  . Protein, ur 08/11/2015 NEGATIVE  NEGATIVE mg/dL Final  . Nitrite 08/11/2015 POSITIVE* NEGATIVE Final  . Leukocytes, UA 08/11/2015 NEGATIVE  NEGATIVE Final  . Squamous Epithelial / LPF 08/11/2015 NONE SEEN  NONE SEEN Final  . WBC, UA 08/11/2015 0-5  0 - 5 WBC/hpf Final  . RBC / HPF 08/11/2015 NONE SEEN  0 - 5 RBC/hpf Final  . Bacteria, UA 08/11/2015 RARE* NONE SEEN Final     X-Rays:No results found.  EKG: Orders placed or performed during the hospital encounter of 08/11/15  . EKG 12 lead  . EKG 12 lead     Hospital Course: Ann Maxwell is a 75 y.o. who was admitted to [facility]. They were brought to the operating room on 08/23/2015 and underwent Procedure(s): TOTAL KNEE ARTHROPLASTY.  Patient tolerated the procedure well and was later transferred to the recovery room and then to the orthopaedic floor for postoperative care.  They were given PO and IV analgesics for pain control following their surgery.  They were given 24 hours of postoperative antibiotics of    Anti-infectives    Start     Dose/Rate Route Frequency Ordered Stop   08/23/15 1700  ceFAZolin (ANCEF) IVPB 2g/100 mL premix     2 g 200 mL/hr over 30 Minutes Intravenous Every 6 hours 08/23/15 1435 08/23/15 2232   08/23/15 0837  ceFAZolin (ANCEF) IVPB 2g/100 mL premix     2 g 200 mL/hr over 30 Minutes Intravenous On call to O.R. 08/23/15 0045 08/23/15 1057     and started on DVT prophylaxis in the form of Xarelto.   PT and OT were ordered for total joint protocol.  Discharge planning consulted to help with postop disposition and equipment needs.  Patient had a tough night on the evening of surgery.  They started to get up OOB with therapy on day one. Hemovac drain was pulled without difficulty.  Continued to work with therapy into day two.  Dressing was changed on day two and the incision was healing well. Patient was seen in rounds and was ready to go home on day two.  Discharge home with home health Diet - Cardiac diet Follow up - in 2 weeks Activity - WBAT Disposition - Home Condition Upon Discharge - Good D/C Meds - See DC Summary DVT Prophylaxis - Xarelto  Discharge Instructions    Call MD / Call 911    Complete by:  As directed   If you experience chest pain or shortness of breath, CALL 911 and be transported to the hospital emergency room.  If you develope a fever above 101 F, pus (white drainage) or increased drainage or redness at the wound, or calf pain, call your surgeon's office.  Change dressing    Complete by:  As directed   Change dressing daily with sterile 4 x 4 inch gauze dressing and apply TED hose. Do not submerge the incision under water.     Constipation Prevention    Complete by:  As directed   Drink plenty of fluids.  Prune juice may be helpful.  You may use a stool softener, such as Colace (over the counter) 100 mg twice a day.  Use MiraLax (over the counter) for constipation as needed.     Diet - low sodium heart healthy    Complete by:  As directed       Discharge instructions    Complete by:  As directed   Pick up stool softner and laxative for home use following surgery while on pain medications. Do not submerge incision under water. Please use good hand washing techniques while changing dressing each day. May shower starting three days after surgery. Please use a clean towel to pat the incision dry following showers. Continue to use ice for pain and swelling after surgery. Do not use any lotions or creams on the incision until instructed by your surgeon.  Take Xarelto for two and a half more weeks, then discontinue Xarelto. Once the patient has completed the Xarelto, they may resume the 81 mg Aspirin.  Postoperative Constipation Protocol  Constipation - defined medically as fewer than three stools per week and severe constipation as less than one stool per week.  One of the most common issues patients have following surgery is constipation.  Even if you have a regular bowel pattern at home, your normal regimen is likely to be disrupted due to multiple reasons following surgery.  Combination of anesthesia, postoperative narcotics, change in appetite and fluid intake all can affect your bowels.  In order to avoid complications following surgery, here are some recommendations in order to help you during your recovery period.  Colace (docusate) - Pick up an over-the-counter form of Colace or another stool softener and take twice a day as long as you are requiring postoperative pain medications.  Take with a full glass of water daily.  If you experience loose stools or diarrhea, hold the colace until you stool forms back up.  If your symptoms do not get better within 1 week or if they get worse, check with your doctor.  Dulcolax (bisacodyl) - Pick up over-the-counter and take as directed by the product packaging as needed to assist with the movement of your bowels.  Take with a full glass of water.  Use this product as needed if not relieved by  Colace only.   MiraLax (polyethylene glycol) - Pick up over-the-counter to have on hand.  MiraLax is a solution that will increase the amount of water in your bowels to assist with bowel movements.  Take as directed and can mix with a glass of water, juice, soda, coffee, or tea.  Take if you go more than two days without a movement. Do not use MiraLax more than once per day. Call your doctor if you are still constipated or irregular after using this medication for 7 days in a row.  If you continue to have problems with postoperative constipation, please contact the office for further assistance and recommendations.  If you experience "the worst abdominal pain ever" or develop nausea or vomiting, please contact the office immediatly for further recommendations for treatment.     Do not put a pillow under the knee. Place it under the  heel.    Complete by:  As directed      Do not sit on low chairs, stoools or toilet seats, as it may be difficult to get up from low surfaces    Complete by:  As directed      Driving restrictions    Complete by:  As directed   No driving until released by the physician.     Increase activity slowly as tolerated    Complete by:  As directed      Lifting restrictions    Complete by:  As directed   No lifting until released by the physician.     Patient may shower    Complete by:  As directed   You may shower without a dressing once there is no drainage.  Do not wash over the wound.  If drainage remains, do not shower until drainage stops.     TED hose    Complete by:  As directed   Use stockings (TED hose) for 3 weeks on both leg(s).  You may remove them at night for sleeping.     Weight bearing as tolerated    Complete by:  As directed   Laterality:  left  Extremity:  Lower            Medication List    STOP taking these medications        aspirin EC 81 MG tablet     celecoxib 200 MG capsule  Commonly known as:  CELEBREX     multivitamin with  minerals Tabs tablet     PROBIOTIC DAILY PO      TAKE these medications        acetaminophen 500 MG tablet  Commonly known as:  TYLENOL  Take 1,000 mg by mouth every 6 (six) hours as needed (For pain).     ADVAIR DISKUS 250-50 MCG/DOSE Aepb  Generic drug:  Fluticasone-Salmeterol  Inhale 1 puff into the lungs 2 (two) times daily.     albuterol 108 (90 Base) MCG/ACT inhaler  Commonly known as:  PROVENTIL HFA;VENTOLIN HFA  Inhale 2 puffs into the lungs every 4 (four) hours as needed for wheezing or shortness of breath.     amitriptyline 25 MG tablet  Commonly known as:  ELAVIL  Take 25 mg by mouth at bedtime.     anastrozole 1 MG tablet  Commonly known as:  ARIMIDEX  Take 1 tablet (1 mg total) by mouth daily.     hydrocortisone 2.5 % rectal cream  Commonly known as:  ANUSOL-HC  Place 1 application rectally 3 (three) times daily as needed for hemorrhoids or itching.     ipratropium-albuterol 0.5-2.5 (3) MG/3ML Soln  Commonly known as:  DUONEB  Take 3 mLs by nebulization every 6 (six) hours as needed (For shortness of breath.).     loratadine 10 MG tablet  Commonly known as:  CLARITIN  Take 10 mg by mouth daily as needed for allergies.     losartan-hydrochlorothiazide 50-12.5 MG tablet  Commonly known as:  HYZAAR  Take 1 tablet by mouth daily.     methocarbamol 500 MG tablet  Commonly known as:  ROBAXIN  Take 1 tablet (500 mg total) by mouth every 6 (six) hours as needed for muscle spasms.     montelukast 10 MG tablet  Commonly known as:  SINGULAIR  Take 10 mg by mouth at bedtime.     oxyCODONE 5 MG immediate release tablet  Commonly known as:  Oxy  IR/ROXICODONE  Take 1-2 tablets (5-10 mg total) by mouth every 3 (three) hours as needed for moderate pain or severe pain.     polyvinyl alcohol 1.4 % ophthalmic solution  Commonly known as:  LIQUIFILM TEARS  Place 2 drops into both eyes as needed for dry eyes.     rivaroxaban 10 MG Tabs tablet  Commonly known as:   XARELTO  Take 1 tablet (10 mg total) by mouth daily with breakfast. Take Xarelto for two and a half more weeks, then discontinue Xarelto. Once the patient has completed the Xarelto, they may resume the 81 mg Aspirin.     traMADol 50 MG tablet  Commonly known as:  ULTRAM  Take 1-2 tablets (50-100 mg total) by mouth every 6 (six) hours as needed (mild pain).           Follow-up Information    Follow up with Gearlean Alf, MD. Schedule an appointment as soon as possible for a visit on 09/07/2015.   Specialty:  Orthopedic Surgery   Why:  Call office at [phone] to setup appointment on Tuesday 09/07/2015 with Dr. Wynelle Link.   Contact information:   [address] [place] [address] [phone]       Signed: [name], PA-C Orthopaedic Surgery 08/24/2015, 9:16 AM

## 2015-08-24 NOTE — Progress Notes (Signed)
Physical Therapy Treatment Patient Details Name: Ann Maxwell MRN: [id number] DOB: 1941-02-21 Today's Date: 08/24/2015    History of Present Illness 75 yo female s/p L TKA 08/23/15    PT Comments    Progressing with mobility.   Follow Up Recommendations  Home health PT;Supervision/Assistance - 24 hour     Equipment Recommendations  None recommended by PT    Recommendations for Other Services       Precautions / Restrictions Precautions Precautions: Fall Required Braces or Orthoses: Knee Immobilizer - Left Knee Immobilizer - Left: Discontinue once straight leg raise with < 10 degree lag Restrictions Weight Bearing Restrictions: No LLE Weight Bearing: Weight bearing as tolerated    Mobility  Bed Mobility Overal bed mobility: Needs Assistance Bed Mobility: Sit to Supine       Sit to supine: Min assist   General bed mobility comments: Assist for L LE  Transfers Overall transfer level: Needs assistance Equipment used: Rolling walker (2 wheeled) Transfers: Sit to/from Stand Sit to Stand: Min assist         General transfer comment: Assist to stabilize. VCs safety, hand/LE placement.   Ambulation/Gait Ambulation/Gait assistance: Min guard Ambulation Distance (Feet): 50 Feet (50'x1, 15'x1) Assistive device: Rolling walker (2 wheeled) Gait Pattern/deviations: Step-to pattern;Antalgic;Trunk flexed     General Gait Details: close guard for safety. slow gait speed. Fatigues fairly easily.    Stairs            Wheelchair Mobility    Modified Rankin (Stroke Patients Only)       Balance                                    Cognition Arousal/Alertness: Awake/alert Behavior During Therapy: WFL for tasks assessed/performed Overall Cognitive Status: Within Functional Limits for tasks assessed                      Exercises Total Joint Exercises Ankle Circles/Pumps: AROM;Both;10 reps;Supine Quad Sets: AROM;Both;10  reps;Supine Heel Slides: AAROM;Left;10 reps;Supine Hip ABduction/ADduction: AAROM;Left;10 reps;Supine Straight Leg Raises: AAROM;Left;10 reps;Supine Goniometric ROM: ~10-55 degrees    General Comments        Pertinent Vitals/Pain Pain Assessment: 0-10 Pain Score: 7  Pain Location: L knee with activity Pain Descriptors / Indicators: Sore;Aching Pain Intervention(s): Monitored during session;Premedicated before session;Ice applied;Repositioned    Home Living Family/patient expects to be discharged to:: Private residence Living Arrangements: Alone (going to a friend's home) Available Help at Discharge: Friend(s);Available 24 hours/day Type of Home: Apartment Home Access: Stairs to enter   Home Layout: One level Home Equipment: Environmental consultant - 2 wheels;Walker - 4 wheels;Adaptive equipment      Prior Function Level of Independence: Independent with assistive device(s)      Comments: was using a walker prior to sx   PT Goals (current goals can now be found in the care plan section) Acute Rehab PT Goals Patient Stated Goal: home tomorrow Time For Goal Achievement: 08/31/15 Potential to Achieve Goals: Good Progress towards PT goals: Progressing toward goals    Frequency  7X/week    PT Plan Current plan remains appropriate    Co-evaluation             End of Session Equipment Utilized During Treatment: Gait belt;Left knee immobilizer Activity Tolerance: Patient limited by fatigue;Patient limited by pain Patient left: in bed;with call bell/phone within reach;with family/visitor present  Time: [id number] PT Time Calculation (min) (ACUTE ONLY): 26 min  Charges:  $Gait Training: 8-22 mins $Therapeutic Exercise: 8-22 mins                    G Codes:      Weston Anna, MPT Pager: [phone]

## 2015-08-25 LAB — BASIC METABOLIC PANEL
Anion gap: 9 (ref 5–15)
BUN: 12 mg/dL (ref 6–20)
CALCIUM: 8.7 mg/dL — AB (ref 8.9–10.3)
CHLORIDE: 102 mmol/L (ref 101–111)
CO2: 29 mmol/L (ref 22–32)
CREATININE: 0.64 mg/dL (ref 0.44–1.00)
GFR calc non Af Amer: >60 mL/min (ref >60)
Glucose, Bld: 128 mg/dL — ABNORMAL HIGH (ref 65–99)
Potassium: 3.7 mmol/L (ref 3.5–5.1)
Sodium: 140 mmol/L (ref 135–145)

## 2015-08-25 LAB — CBC
HEMATOCRIT: 33.2 % — AB (ref 36.0–46.0)
Hemoglobin: 10.9 g/dL — ABNORMAL LOW (ref 12.0–15.0)
MCH: 29 pg (ref 26.0–34.0)
MCHC: 32.8 g/dL (ref 30.0–36.0)
MCV: 88.3 fL (ref 78.0–100.0)
Platelets: 239 10*3/uL (ref 150–400)
RBC: 3.76 MIL/uL — ABNORMAL LOW (ref 3.87–5.11)
RDW: 13.1 % (ref 11.5–15.5)
WBC: 15.5 10*3/uL — ABNORMAL HIGH (ref 4.0–10.5)

## 2015-08-25 NOTE — Progress Notes (Signed)
Physical Therapy Treatment Patient Details Name: CHEREE CONG MRN: [id number] DOB: 25-Jul-1940 Today's Date: 08/25/2015    History of Present Illness 75 yo female s/p L TKA 08/23/15    PT Comments    Progressing slowly with mobility. Limited some by pain and fatigue on today. Will have a 2nd session prior to d/c later today.  Follow Up Recommendations  Home health PT;Supervision/Assistance - 24 hour     Equipment Recommendations  None recommended by PT    Recommendations for Other Services       Precautions / Restrictions Precautions Precautions: Fall Required Braces or Orthoses: Knee Immobilizer - Left Knee Immobilizer - Left: Discontinue once straight leg raise with < 10 degree lag Restrictions Weight Bearing Restrictions: No LLE Weight Bearing: Weight bearing as tolerated    Mobility  Bed Mobility Overal bed mobility: Needs Assistance Bed Mobility: Sit to Supine           General bed mobility comments: Assist for L LE  Transfers Overall transfer level: Needs assistance Equipment used: Rolling walker (2 wheeled) Transfers: Sit to/from Stand Sit to Stand: Min guard         General transfer comment: VCs safety, hand/LE placement. close guard for safety  Ambulation/Gait Ambulation/Gait assistance: Min guard Ambulation Distance (Feet): 35 Feet Assistive device: Rolling walker (2 wheeled) Gait Pattern/deviations: Step-to pattern;Antalgic;Trunk flexed     General Gait Details: close guard for safety. slow gait speed. Fatigues fairly easily.    Stairs Stairs: Yes Stairs assistance: Min guard Stair Management: Step to pattern;Backwards;With walker Number of Stairs: 1 (x2) General stair comments: VCs safety, technique, sequence. close guard for safety. Practiced x2 (once with portable 1 step, once with shower ledge).   Wheelchair Mobility    Modified Rankin (Stroke Patients Only)       Balance                                     Cognition Arousal/Alertness: Awake/alert Behavior During Therapy: WFL for tasks assessed/performed Overall Cognitive Status: Within Functional Limits for tasks assessed                      Exercises Total Joint Exercises Ankle Circles/Pumps: AROM;Both;10 reps;Supine Quad Sets: AROM;Both;10 reps;Supine Heel Slides: AAROM;Left;10 reps;Supine Hip ABduction/ADduction: AAROM;Left;10 reps;Supine Straight Leg Raises: AAROM;Left;10 reps;Supine Goniometric ROM: ~10-45 degrees. (limited by pain on today)    General Comments        Pertinent Vitals/Pain Pain Assessment: 0-10 Pain Score: 7  Pain Location: L knee with activity Pain Descriptors / Indicators: Sore Pain Intervention(s): Monitored during session;Ice applied;Repositioned    Home Living                      Prior Function            PT Goals (current goals can now be found in the care plan section) Progress towards PT goals: Progressing toward goals    Frequency  7X/week    PT Plan Current plan remains appropriate    Co-evaluation             End of Session   Activity Tolerance: Patient limited by fatigue;Patient limited by pain Patient left: in bed;with call bell/phone within reach;with family/visitor present     Time: [id number] PT Time Calculation (min) (ACUTE ONLY): 22 min  Charges:  $Gait Training: 8-22 mins  G Codes:      Weston Anna, MPT Pager: [phone]

## 2015-08-25 NOTE — Progress Notes (Signed)
[facility]    [facility] is providing the following services: commode  If patient discharges after hours, please call [phone].   Linward Headland 08/25/2015, 10:30 AM

## 2015-08-25 NOTE — Progress Notes (Signed)
   Subjective: 2 Days Post-Op Procedure(s) (LRB): TOTAL KNEE ARTHROPLASTY (Left) Patient reports pain as mild.   Patient seen in rounds with Dr. Wynelle Link.  Doing well. Patient is well, and has had no acute complaints or problems Patient is ready to go home  Objective: Vital signs in last 24 hours: Temp:  [97.3 F (36.3 C)-98.6 F (37 C)] 97.6 F (36.4 C) (05/10 0451) Pulse Rate:  [72-80] 80 (05/10 0451) Resp:  [16-18] 16 (05/10 0451) BP: (116-142)/(57-82) 140/71 mmHg (05/10 0451) SpO2:  [92 %-97 %] 97 % (05/10 0451)  Intake/Output from previous day:  Intake/Output Summary (Last 24 hours) at 08/25/15 0727 Last data filed at 08/25/15 0453  Gross per 24 hour  Intake 1698.42 ml  Output   3800 ml  Net -2101.58 ml    Labs:  Recent Labs  08/24/15 0426 08/25/15 0357  HGB 11.5* 10.9*    Recent Labs  08/24/15 0426 08/25/15 0357  WBC 14.8* 15.5*  RBC 3.99 3.76*  HCT 35.2* 33.2*  PLT 233 239    Recent Labs  08/24/15 0426 08/25/15 0357  NA 138 140  K 4.1 3.7  CL 104 102  CO2 28 29  BUN 14 12  CREATININE 0.73 0.64  GLUCOSE 133* 128*  CALCIUM 8.5* 8.7*   No results for input(s): LABPT, INR in the last 72 hours.  EXAM: General - Patient is Alert and Appropriate Extremity - Neurovascular intact Sensation intact distally Incision - clean, dry Motor Function - intact, moving foot and toes well on exam.   Assessment/Plan: 2 Days Post-Op Procedure(s) (LRB): TOTAL KNEE ARTHROPLASTY (Left) Procedure(s) (LRB): TOTAL KNEE ARTHROPLASTY (Left) Past Medical History  Diagnosis Date  . Asthma   . Hypertension   . Obesity   . Environmental allergies   . Vertigo     no recent issues  . GERD (gastroesophageal reflux disease)     heartburn occasionally  . Internal hemorrhoids     remains an issue- no bright bleeding seen recent  . Transfusion history     2 yrs ago due to anemia  . Anemia     iron infusion -last done 1 month ago(12-15, and 12-22 -4 [facility])- Dr.  Lindi Adie  . Iron deficiency anemia   . History of kidney stones   . Osteoarthritis     arthritis- knees and shoulders  . Depression   . Breast CA ([place]) 2013    a. L breast DCIS, s/p L mastectomy 12/2011. no further issues  . Colon cancer ([place])    Principal Problem:   OA (osteoarthritis) of knee  Estimated body mass index is 35.44 kg/(m^2) as calculated from the following:   Height as of this encounter: 5' 4.5" (1.638 m).   Weight as of this encounter: 95.074 kg (209 lb 9.6 oz). Up with therapy Discharge home with home health Diet - Cardiac diet Follow up - in 2 weeks Activity - WBAT Disposition - Home Condition Upon Discharge - Good D/C Meds - See DC Summary DVT Prophylaxis - [place], PA-C Orthopaedic Surgery 08/25/2015, 7:27 AM

## 2015-08-25 NOTE — Progress Notes (Signed)
Pt to d/c home with [facility]. DME (3n1) delivered to room prior to d/c. AVS reviewed and "My Chart" discussed with pt. Pt capable of verbalizing medications (PA called prescriptions into patient's pharmacy), dressing changes, signs and symptoms of infection, and follow-up appointments. Remains hemodynamically stable. No signs and symptoms of distress. Educated pt to return to ER in the case of SOB, dizziness, or chest pain.

## 2015-08-25 NOTE — Progress Notes (Signed)
Physical Therapy Treatment Patient Details Name: Ann Maxwell MRN: [id number] DOB: [birth year] Today's Date: 08/25/2015    History of Present Illness 75 yo female s/p L TKA 08/23/15    PT Comments    Progressing with mobility. Fatigues fairly easily with ambulation. All education completed.   Follow Up Recommendations  Home health PT;Supervision/Assistance - 24 hour     Equipment Recommendations  None recommended by PT    Recommendations for Other Services       Precautions / Restrictions Precautions Precautions: Fall Required Braces or Orthoses: Knee Immobilizer - Left Knee Immobilizer - Left: Discontinue once straight leg raise with < 10 degree lag Restrictions Weight Bearing Restrictions: No LLE Weight Bearing: Weight bearing as tolerated    Mobility  Bed Mobility               General bed mobility comments: oob in recliner  Transfers Overall transfer level: Needs assistance Equipment used: Rolling walker (2 wheeled) Transfers: Sit to/from Stand Sit to Stand: Min guard         General transfer comment: VCs safety, hand/LE placement. close guard for safety  Ambulation/Gait Ambulation/Gait assistance: Min guard Ambulation Distance (Feet): 50 Feet Assistive device: Rolling walker (2 wheeled) Gait Pattern/deviations: Step-to pattern     General Gait Details: close guard for safety. slow gait speed. Fatigues fairly easily.    Stairs            Wheelchair Mobility    Modified Rankin (Stroke Patients Only)       Balance                                    Cognition Arousal/Alertness: Awake/alert Behavior During Therapy: WFL for tasks assessed/performed Overall Cognitive Status: Within Functional Limits for tasks assessed                      Exercises      General Comments        Pertinent Vitals/Pain Pain Assessment: 0-10 Pain Score: 7  Pain Location: L knee with activity Pain Descriptors / Indicators:  Sore Pain Intervention(s): Monitored during session;Ice applied;Repositioned;Patient requesting pain meds-RN notified    Home Living                      Prior Function            PT Goals (current goals can now be found in the care plan section) Progress towards PT goals: Progressing toward goals    Frequency  7X/week    PT Plan Current plan remains appropriate    Co-evaluation             End of Session Equipment Utilized During Treatment: Gait belt;Left knee immobilizer Activity Tolerance: Patient limited by fatigue;Patient limited by pain Patient left: in chair;with family/visitor present     Time: [id number] PT Time Calculation (min) (ACUTE ONLY): 15 min  Charges:  $Gait Training: 8-22 mins                    G Codes:      Weston Anna, MPT Pager: [phone]

## 2015-08-26 LAB — TYPE AND SCREEN
ABO/RH(D): O POS
Antibody Screen: POSITIVE
DAT, IGG: NEGATIVE
DONOR AG TYPE: NEGATIVE
UNIT DIVISION: 0

## 2016-01-06 ENCOUNTER — Other Ambulatory Visit: Payer: Self-pay | Admitting: Hematology and Oncology

## 2016-01-06 DIAGNOSIS — Z9012 Acquired absence of left breast and nipple: Secondary | ICD-10-CM

## 2016-01-06 DIAGNOSIS — Z853 Personal history of malignant neoplasm of breast: Secondary | ICD-10-CM

## 2016-01-06 DIAGNOSIS — Z1231 Encounter for screening mammogram for malignant neoplasm of breast: Secondary | ICD-10-CM

## 2016-02-09 ENCOUNTER — Ambulatory Visit
Admission: RE | Admit: 2016-02-09 | Discharge: 2016-02-09 | Disposition: A | Discharge status: Home / Self Care | Payer: Medicare Other | Source: Ambulatory Visit | Attending: Hematology and Oncology | Admitting: Hematology and Oncology

## 2016-02-09 DIAGNOSIS — Z853 Personal history of malignant neoplasm of breast: Secondary | ICD-10-CM

## 2016-02-09 DIAGNOSIS — Z1231 Encounter for screening mammogram for malignant neoplasm of breast: Secondary | ICD-10-CM

## 2016-02-09 DIAGNOSIS — Z9012 Acquired absence of left breast and nipple: Secondary | ICD-10-CM

## 2016-04-24 ENCOUNTER — Other Ambulatory Visit: Payer: Self-pay | Admitting: Emergency Medicine

## 2016-04-24 ENCOUNTER — Telehealth: Payer: Self-pay | Admitting: Emergency Medicine

## 2016-04-24 DIAGNOSIS — C50512 Malignant neoplasm of lower-outer quadrant of left female breast: Secondary | ICD-10-CM

## 2016-04-24 NOTE — Telephone Encounter (Signed)
Left message on patients voicemail in regards to change in lab appointment; lab appointment moved to 1/22 prior to CT scan.

## 2016-04-25 ENCOUNTER — Other Ambulatory Visit: Payer: Self-pay | Admitting: Emergency Medicine

## 2016-04-27 ENCOUNTER — Other Ambulatory Visit: Payer: Self-pay | Admitting: Hematology and Oncology

## 2016-04-27 DIAGNOSIS — C50512 Malignant neoplasm of lower-outer quadrant of left female breast: Secondary | ICD-10-CM

## 2016-05-04 ENCOUNTER — Other Ambulatory Visit: Payer: Medicare Other

## 2016-05-04 ENCOUNTER — Telehealth: Payer: Self-pay | Admitting: Hematology and Oncology

## 2016-05-04 NOTE — Telephone Encounter (Signed)
Patient called to reschedule appointment per weather.

## 2016-05-05 ENCOUNTER — Other Ambulatory Visit (HOSPITAL_BASED_OUTPATIENT_CLINIC_OR_DEPARTMENT_OTHER): Payer: Medicare Other

## 2016-05-05 DIAGNOSIS — C50512 Malignant neoplasm of lower-outer quadrant of left female breast: Secondary | ICD-10-CM | POA: Diagnosis not present

## 2016-05-05 LAB — COMPREHENSIVE METABOLIC PANEL
ALT: 23 U/L (ref 0–55)
ANION GAP: 12 meq/L — AB (ref 3–11)
AST: 20 U/L (ref 5–34)
Albumin: 4 g/dL (ref 3.5–5.0)
Alkaline Phosphatase: 113 U/L (ref 40–150)
BUN: 17.5 mg/dL (ref 7.0–26.0)
CHLORIDE: 103 meq/L (ref 98–109)
CO2: 25 meq/L (ref 22–29)
CREATININE: 0.8 mg/dL (ref 0.6–1.1)
Calcium: 10 mg/dL (ref 8.4–10.4)
EGFR: 69 mL/min/{1.73_m2} — AB (ref >90)
Glucose: 115 mg/dl (ref 70–140)
POTASSIUM: 3.5 meq/L (ref 3.5–5.1)
Sodium: 140 mEq/L (ref 136–145)
Total Bilirubin: 0.32 mg/dL (ref 0.20–1.20)
Total Protein: 7.8 g/dL (ref 6.4–8.3)

## 2016-05-05 LAB — CBC WITH DIFFERENTIAL/PLATELET
BASO%: 0.3 % (ref 0.0–2.0)
Basophils Absolute: 0 10*3/uL (ref 0.0–0.1)
EOS%: 3.8 % (ref 0.0–7.0)
Eosinophils Absolute: 0.4 10*3/uL (ref 0.0–0.5)
HCT: 44.6 % (ref 34.8–46.6)
HGB: 14.9 g/dL (ref 11.6–15.9)
LYMPH%: 13.7 % — AB (ref 14.0–49.7)
MCH: 29.5 pg (ref 25.1–34.0)
MCHC: 33.4 g/dL (ref 31.5–36.0)
MCV: 88.3 fL (ref 79.5–101.0)
MONO#: 0.7 10*3/uL (ref 0.1–0.9)
MONO%: 6.6 % (ref 0.0–14.0)
NEUT#: 7.6 10*3/uL — ABNORMAL HIGH (ref 1.5–6.5)
NEUT%: 75.6 % (ref 38.4–76.8)
PLATELETS: 260 10*3/uL (ref 145–400)
RBC: 5.05 10*6/uL (ref 3.70–5.45)
RDW: 13.6 % (ref 11.2–14.5)
WBC: 10 10*3/uL (ref 3.9–10.3)
lymph#: 1.4 10*3/uL (ref 0.9–3.3)

## 2016-05-08 ENCOUNTER — Ambulatory Visit (HOSPITAL_COMMUNITY)
Admission: RE | Admit: 2016-05-08 | Discharge: 2016-05-08 | Disposition: A | Discharge status: Home / Self Care | Payer: Medicare Other | Source: Ambulatory Visit | Attending: Hematology and Oncology | Admitting: Hematology and Oncology

## 2016-05-08 ENCOUNTER — Encounter (HOSPITAL_COMMUNITY): Payer: Self-pay

## 2016-05-08 ENCOUNTER — Other Ambulatory Visit: Payer: Medicare Other

## 2016-05-08 DIAGNOSIS — C189 Malignant neoplasm of colon, unspecified: Secondary | ICD-10-CM | POA: Diagnosis not present

## 2016-05-08 DIAGNOSIS — K862 Cyst of pancreas: Secondary | ICD-10-CM | POA: Diagnosis present

## 2016-05-08 DIAGNOSIS — K429 Umbilical hernia without obstruction or gangrene: Secondary | ICD-10-CM | POA: Insufficient documentation

## 2016-05-08 DIAGNOSIS — R918 Other nonspecific abnormal finding of lung field: Secondary | ICD-10-CM | POA: Diagnosis not present

## 2016-05-08 DIAGNOSIS — I2510 Atherosclerotic heart disease of native coronary artery without angina pectoris: Secondary | ICD-10-CM | POA: Diagnosis not present

## 2016-05-08 DIAGNOSIS — M4854XA Collapsed vertebra, not elsewhere classified, thoracic region, initial encounter for fracture: Secondary | ICD-10-CM | POA: Insufficient documentation

## 2016-05-08 MED ORDER — IOPAMIDOL (ISOVUE-300) INJECTION 61%
100.0000 mL | Freq: Once | INTRAVENOUS | Status: AC | PRN
  Administered 2016-05-08: 100 mL via INTRAVENOUS

## 2016-05-08 MED ORDER — IOPAMIDOL (ISOVUE-300) INJECTION 61%
INTRAVENOUS | Status: AC
  Filled 2016-05-08: qty 100

## 2016-05-09 ENCOUNTER — Other Ambulatory Visit: Payer: Self-pay | Admitting: Hematology and Oncology

## 2016-05-09 DIAGNOSIS — Z780 Asymptomatic menopausal state: Secondary | ICD-10-CM

## 2016-05-12 ENCOUNTER — Other Ambulatory Visit: Payer: Self-pay

## 2016-05-12 DIAGNOSIS — C50512 Malignant neoplasm of lower-outer quadrant of left female breast: Secondary | ICD-10-CM

## 2016-05-23 ENCOUNTER — Other Ambulatory Visit: Payer: Medicare Other

## 2016-05-30 ENCOUNTER — Encounter: Payer: Self-pay | Admitting: Hematology and Oncology

## 2016-05-30 ENCOUNTER — Ambulatory Visit (HOSPITAL_BASED_OUTPATIENT_CLINIC_OR_DEPARTMENT_OTHER): Payer: Medicare Other | Admitting: Hematology and Oncology

## 2016-05-30 DIAGNOSIS — Z79811 Long term (current) use of aromatase inhibitors: Secondary | ICD-10-CM

## 2016-05-30 DIAGNOSIS — C189 Malignant neoplasm of colon, unspecified: Secondary | ICD-10-CM

## 2016-05-30 DIAGNOSIS — Z85038 Personal history of other malignant neoplasm of large intestine: Secondary | ICD-10-CM

## 2016-05-30 DIAGNOSIS — Z170 Estrogen receptor positive status [ER+]: Secondary | ICD-10-CM | POA: Diagnosis not present

## 2016-05-30 DIAGNOSIS — C50512 Malignant neoplasm of lower-outer quadrant of left female breast: Secondary | ICD-10-CM | POA: Diagnosis not present

## 2016-05-30 MED ORDER — ANASTROZOLE 1 MG PO TABS: 1.0000 mg | ORAL_TABLET | Freq: Every day | ORAL | 2 refills | Status: DC

## 2016-05-30 NOTE — Assessment & Plan Note (Signed)
Cecal adenocarcinoma diagnosed 04/22/2013, preop CEA of 5.1, status post right hemicolectomy 04/28/2014, grade 2, no MVI, 0/25 lymph nodes, subserosal involvement, T3 N0 M0 stage II A, no mismatch repair  Current treatment: Surveillance with CEA and Colonoscopy and CT scan in one year in January 2018:   CT scan January 2018 revealed pancreatic cysts which were previously further evaluated by abdominal MRI/MRCP with 3-D reconstruction and it suggested that these are benign cysts the pancreas; New T 11 compression fracture, benign pulmonary nodules, atherosclerosis, no malignancy Anemia has resolved  CEA is 1.8: Normal  Return to clinic in 1 year after CT scans

## 2016-05-30 NOTE — Progress Notes (Signed)
Patient Care Team: Leanna Battles, MD as PCP - General (Internal Medicine)  DIAGNOSIS:  Encounter Diagnoses  Name Primary?  . Primary cancer of lower outer quadrant of left female breast ([place])   . Primary colon cancer ([place])     SUMMARY OF ONCOLOGIC HISTORY:   Primary cancer of lower outer quadrant of left female breast ([place])   01/04/2012 Surgery    Left mastectomy: Invasive ductal carcinoma 0.5 cm with high-grade DCIS 9 cm, margins negative, 0/6 lymph nodes, ER 100%, PR 100%, Ki-67 17%, HER-2 negative ratio 1.16      02/07/2012 -  Anti-estrogen oral therapy    Anastrozole 1 mg daily       Primary colon cancer ([place])   03/20/2014 Initial Diagnosis    Invasive adenocarcinoma of [place]      04/28/2014 Surgery    Right hemicolectomy: Invasive adenocarcinoma with mucinous features moderately differentiated invading subserosal tissue, margins negative, 25 lymph nodes negative, T3 N0 M0 stage II a, no microsatellite instability       CHIEF COMPLIANT: Follow-up of breast cancer and colon cancers  INTERVAL HISTORY: Ann Maxwell is a 76 year old with above-mentioned history of breast cancer currently on Arimidex therapy. She is tolerating Arimidex fairly well. She will complete 5 years of therapy by October 2018. She also has history of right colon cancer that was treated with hemicolectomy. She did not require adjuvant chemotherapy. She is here for surveillance of the colon cancer as well. Denies any abdominal pain nausea vomiting diarrhea or constipation. She had knee replacement and is able to do yoga twice a week  REVIEW OF SYSTEMS:   Constitutional: Denies fevers, chills or abnormal weight loss Eyes: Denies blurriness of vision Ears, nose, mouth, throat, and face: Denies mucositis or sore throat Respiratory: Denies cough, dyspnea or wheezes Cardiovascular: Denies palpitation, chest discomfort Gastrointestinal:  Denies nausea, heartburn or change in bowel habits Skin: Denies  abnormal skin rashes Lymphatics: Denies new lymphadenopathy or easy bruising Neurological:Denies numbness, tingling or new weaknesses Behavioral/Psych: Mood is stable, no new changes  Extremities: No lower extremity edema Breast:  denies any pain or lumps or nodules in either breasts All other systems were reviewed with the patient and are negative.  I have reviewed the past medical history, past surgical history, social history and family history with the patient and they are unchanged from previous note.  ALLERGIES:  is allergic to acyclovir and related; hydrocodone; adhesive [tape]; and terramycin [oxytetracycline].  MEDICATIONS:  Current Outpatient Prescriptions  Medication Sig Dispense Refill  . acetaminophen (TYLENOL) 500 MG tablet Take 1,000 mg by mouth every 6 (six) hours as needed (For pain).    . ADVAIR DISKUS 250-50 MCG/DOSE AEPB Inhale 1 puff into the lungs 2 (two) times daily. 180 each 0  . albuterol (PROVENTIL HFA;VENTOLIN HFA) 108 (90 BASE) MCG/ACT inhaler Inhale 2 puffs into the lungs every 4 (four) hours as needed for wheezing or shortness of breath.    . anastrozole (ARIMIDEX) 1 MG tablet Take 1 tablet (1 mg total) by mouth daily. 90 tablet 2  . hydrocortisone (ANUSOL-HC) 2.5 % rectal cream Place 1 application rectally 3 (three) times daily as needed for hemorrhoids or itching.     Marland Kitchen ipratropium-albuterol (DUONEB) 0.5-2.5 (3) MG/3ML SOLN Take 3 mLs by nebulization every 6 (six) hours as needed (For shortness of breath.).     Marland Kitchen loratadine (CLARITIN) 10 MG tablet Take 10 mg by mouth daily as needed for allergies.    Marland Kitchen losartan-hydrochlorothiazide (HYZAAR) 50-12.5  MG per tablet Take 1 tablet by mouth daily.    . montelukast (SINGULAIR) 10 MG tablet Take 10 mg by mouth at bedtime.    . polyvinyl alcohol (LIQUIFILM TEARS) 1.4 % ophthalmic solution Place 2 drops into both eyes as needed for dry eyes.     No current facility-administered medications for this visit.      PHYSICAL EXAMINATION: ECOG PERFORMANCE STATUS: 1 - Symptomatic but completely ambulatory  Vitals:   05/30/16 0924  BP: 130/72  Pulse: 79  Resp: 19  Temp: 97.9 F (36.6 C)   Filed Weights   05/30/16 0924  Weight: 215 lb 4.8 oz (97.7 kg)    GENERAL:alert, no distress and comfortable SKIN: skin color, texture, turgor are normal, no rashes or significant lesions EYES: normal, Conjunctiva are pink and non-injected, sclera clear OROPHARYNX:no exudate, no erythema and lips, buccal mucosa, and tongue normal  NECK: supple, thyroid normal size, non-tender, without nodularity LYMPH:  no palpable lymphadenopathy in the cervical, axillary or inguinal LUNGS: clear to auscultation and percussion with normal breathing effort HEART: regular rate & rhythm and no murmurs and no lower extremity edema ABDOMEN:abdomen soft, non-tender and normal bowel sounds MUSCULOSKELETAL:no cyanosis of digits and no clubbing  NEURO: alert & oriented x 3 with fluent speech, no focal motor/sensory deficits EXTREMITIES: No lower extremity edema BREAST: No palpable masses or nodules in either right or left breasts. No palpable axillary supraclavicular or infraclavicular adenopathy no breast tenderness or nipple discharge. (exam performed in the presence of a chaperone)  LABORATORY DATA:  I have reviewed the data as listed   Chemistry      Component Value Date/Time   NA 140 05/05/2016 1552   K 3.5 05/05/2016 1552   CL 102 08/25/2015 0357   CL 102 09/23/2012 1028   CO2 25 05/05/2016 1552   BUN 17.5 05/05/2016 1552   CREATININE 0.8 05/05/2016 1552      Component Value Date/Time   CALCIUM 10.0 05/05/2016 1552   ALKPHOS 113 05/05/2016 1552   AST 20 05/05/2016 1552   ALT 23 05/05/2016 1552   BILITOT 0.32 05/05/2016 1552       Lab Results  Component Value Date   WBC 10.0 05/05/2016   HGB 14.9 05/05/2016   HCT 44.6 05/05/2016   MCV 88.3 05/05/2016   PLT 260 05/05/2016   NEUTROABS 7.6 (H) 05/05/2016     ASSESSMENT & PLAN:  Primary cancer of lower outer quadrant of left female breast Left breast cancer invasive ductal carcinoma: 0.5 cm with 9 mm area of DCIS status post left mastectomy 01/04/2012 ER/PR positive HER-2 negative Ki-67 17% currently on Arimidex 1 mg daily  Since 02/07/2012.  Arimidex toxicities: 1. Hot flashes: She takes gabapentin for hot flashes. 2. Arthritis in her knees: Left knee arthroplasty May 2017 Patient will complete 5 years of therapy by October 2018. I recommended that she continue antiestrogen therapy for 2 more years based on ABCSG 16 data.  Breast cancer surveillance: 1. Breast exam   05/30/2016 is normal 2. Rt Mammogram 02/09/2016 is normal  Breast density category D  Return to clinic in 1 yr for follow-up  Primary colon cancer Cecal adenocarcinoma diagnosed 04/22/2013, preop CEA of 5.1, status post right hemicolectomy 04/28/2014, grade 2, no MVI, 0/25 lymph nodes, subserosal involvement, T3 N0 M0 stage II A, no mismatch repair  Current treatment: Surveillance with CEA and Colonoscopy and CT scan in one year in January 2018:   CT scan January 2018 revealed pancreatic cysts which  were previously further evaluated by abdominal MRI/MRCP with 3-D reconstruction and it suggested that these are benign cysts the pancreas; New T 11 compression fracture, benign pulmonary nodules, atherosclerosis, no malignancy Anemia has resolved  CEA is 1.8: Normal  Return to clinic in 1 year after CT scans   I spent 25 minutes talking to the patient of which more than half was spent in counseling and coordination of care.  Orders Placed This Encounter  Procedures  . CT Abdomen Pelvis W Contrast    Standing Status:   Future    Standing Expiration Date:   05/30/2017    Order Specific Question:   If indicated for the ordered procedure, I authorize the administration of contrast media per Radiology protocol    Answer:   Yes    Order Specific Question:   Reason for Exam  (SYMPTOM  OR DIAGNOSIS REQUIRED)    Answer:   Colonc ancer with pancreatic cysts    Order Specific Question:   Preferred imaging location?    Answer:   [facility]  . CBC with Differential    Standing Status:   Future    Standing Expiration Date:   05/30/2017  . Comprehensive metabolic panel    Standing Status:   Future    Standing Expiration Date:   05/30/2017  . CEA    Standing Status:   Future    Standing Expiration Date:   07/04/2017   The patient has a good understanding of the overall plan. she agrees with it. she will call with any problems that may develop before the next visit here.   Rulon Eisenmenger, MD 05/30/16

## 2016-05-30 NOTE — Assessment & Plan Note (Signed)
Left breast cancer invasive ductal carcinoma: 0.5 cm with 9 mm area of DCIS status post left mastectomy 01/04/2012 ER/PR positive HER-2 negative Ki-67 17% currently on Arimidex 1 mg daily  Since 02/07/2012.  Arimidex toxicities: 1. Hot flashes: She takes gabapentin for hot flashes. 2. Arthritis in her knees: Left knee arthroplasty May 2017 Patient will complete 5 years of therapy by October 2018. I recommended that she continue antiestrogen therapy for 2 more years based on ABCSG 16 data.  Breast cancer surveillance: 1. Breast exam   05/30/2016 is normal 2. Rt Mammogram 02/09/2016 is normal  Breast density category D  Return to clinic in 1 yr for follow-up

## 2016-06-01 ENCOUNTER — Ambulatory Visit
Admission: RE | Admit: 2016-06-01 | Discharge: 2016-06-01 | Disposition: A | Discharge status: Home / Self Care | Payer: Medicare Other | Source: Ambulatory Visit | Attending: Hematology and Oncology | Admitting: Hematology and Oncology

## 2016-06-01 DIAGNOSIS — C50512 Malignant neoplasm of lower-outer quadrant of left female breast: Secondary | ICD-10-CM

## 2016-06-03 ENCOUNTER — Telehealth: Payer: Self-pay | Admitting: Hematology and Oncology

## 2016-06-03 NOTE — Telephone Encounter (Signed)
Lvm advising appts for lab 05/21/17 @ 8.45am and md 05/28/17 @ [place] [facility] will call with scan. Also, mailed calendar.

## 2016-06-12 ENCOUNTER — Telehealth: Payer: Self-pay

## 2016-06-12 NOTE — Telephone Encounter (Signed)
Pt called and lvm about receiving results from her recent bone density scan. Results states that her t score was -3.2. Last bone density was +1 in 2013. Pt had been on arimidex x70yrs now. Pt would like to speak with Dr.Gudena to see if she can come off arimidex early or if he has any suggestions for treating osteoporosis. Notified Dr.Gudena of pt concerns and will call and discuss results and options for treatment with pt.

## 2017-01-17 ENCOUNTER — Other Ambulatory Visit: Payer: Self-pay | Admitting: Hematology and Oncology

## 2017-01-17 DIAGNOSIS — Z1231 Encounter for screening mammogram for malignant neoplasm of breast: Secondary | ICD-10-CM

## 2017-02-16 ENCOUNTER — Ambulatory Visit
Admission: RE | Admit: 2017-02-16 | Discharge: 2017-02-16 | Disposition: A | Discharge status: Home / Self Care | Payer: Medicare Other | Source: Ambulatory Visit | Attending: Hematology and Oncology | Admitting: Hematology and Oncology

## 2017-02-16 DIAGNOSIS — Z1231 Encounter for screening mammogram for malignant neoplasm of breast: Secondary | ICD-10-CM

## 2017-04-12 ENCOUNTER — Telehealth: Payer: Self-pay | Admitting: *Deleted

## 2017-04-12 NOTE — Telephone Encounter (Signed)
FYI  New order needed for CT scans.    "Do I need to have scans before May 28, 2017 appointment.  I have not received any information about scans."  CT Abdomen/Pelvis are needed before F/U.  Ordered 05-30-2016 with expected date of May 25, 2017.  Provided [facility] Scheduling number to call later to schedule later when needed.  Denies any needs or further questions .

## 2017-04-17 HISTORY — PX: CATARACT EXTRACTION, BILATERAL: SHX1313

## 2017-05-21 ENCOUNTER — Ambulatory Visit (HOSPITAL_COMMUNITY): Payer: Medicare Other

## 2017-05-21 ENCOUNTER — Inpatient Hospital Stay: Payer: Medicare Other | Attending: Hematology and Oncology

## 2017-05-21 DIAGNOSIS — Z79899 Other long term (current) drug therapy: Secondary | ICD-10-CM | POA: Insufficient documentation

## 2017-05-21 DIAGNOSIS — Z85038 Personal history of other malignant neoplasm of large intestine: Secondary | ICD-10-CM | POA: Diagnosis not present

## 2017-05-21 DIAGNOSIS — K862 Cyst of pancreas: Secondary | ICD-10-CM | POA: Insufficient documentation

## 2017-05-21 DIAGNOSIS — Z9012 Acquired absence of left breast and nipple: Secondary | ICD-10-CM | POA: Insufficient documentation

## 2017-05-21 DIAGNOSIS — C50512 Malignant neoplasm of lower-outer quadrant of left female breast: Secondary | ICD-10-CM | POA: Insufficient documentation

## 2017-05-21 DIAGNOSIS — C189 Malignant neoplasm of colon, unspecified: Secondary | ICD-10-CM

## 2017-05-21 DIAGNOSIS — M1990 Unspecified osteoarthritis, unspecified site: Secondary | ICD-10-CM | POA: Insufficient documentation

## 2017-05-21 DIAGNOSIS — Z96652 Presence of left artificial knee joint: Secondary | ICD-10-CM | POA: Diagnosis not present

## 2017-05-21 DIAGNOSIS — N951 Menopausal and female climacteric states: Secondary | ICD-10-CM | POA: Insufficient documentation

## 2017-05-21 DIAGNOSIS — Z170 Estrogen receptor positive status [ER+]: Secondary | ICD-10-CM | POA: Diagnosis not present

## 2017-05-21 LAB — COMPREHENSIVE METABOLIC PANEL
ALT: 19 U/L (ref 0–55)
AST: 17 U/L (ref 5–34)
Albumin: 3.6 g/dL (ref 3.5–5.0)
Alkaline Phosphatase: 78 U/L (ref 40–150)
Anion gap: 7 (ref 3–11)
BUN: 13 mg/dL (ref 7–26)
CHLORIDE: 103 mmol/L (ref 98–109)
CO2: 30 mmol/L — AB (ref 22–29)
CREATININE: 0.77 mg/dL (ref 0.60–1.10)
Calcium: 9.5 mg/dL (ref 8.4–10.4)
GFR calc Af Amer: >60 mL/min (ref >60)
GFR calc non Af Amer: >60 mL/min (ref >60)
Glucose, Bld: 94 mg/dL (ref 70–140)
Potassium: 3.9 mmol/L (ref 3.5–5.1)
SODIUM: 140 mmol/L (ref 136–145)
Total Bilirubin: 0.5 mg/dL (ref 0.2–1.2)
Total Protein: 6.8 g/dL (ref 6.4–8.3)

## 2017-05-21 LAB — CBC WITH DIFFERENTIAL/PLATELET
Basophils Absolute: 0 10*3/uL (ref 0.0–0.1)
Basophils Relative: 0 %
EOS ABS: 0.4 10*3/uL (ref 0.0–0.5)
EOS PCT: 4 %
HCT: 44.7 % (ref 34.8–46.6)
Hemoglobin: 14.4 g/dL (ref 11.6–15.9)
LYMPHS ABS: 1.1 10*3/uL (ref 0.9–3.3)
Lymphocytes Relative: 13 %
MCH: 29.4 pg (ref 25.1–34.0)
MCHC: 32.2 g/dL (ref 31.5–36.0)
MCV: 91.2 fL (ref 79.5–101.0)
MONO ABS: 0.6 10*3/uL (ref 0.1–0.9)
MONOS PCT: 6 %
Neutro Abs: 6.7 10*3/uL — ABNORMAL HIGH (ref 1.5–6.5)
Neutrophils Relative %: 77 %
PLATELETS: 243 10*3/uL (ref 145–400)
RBC: 4.9 MIL/uL (ref 3.70–5.45)
RDW: 13.3 % (ref 11.2–14.5)
WBC: 8.7 10*3/uL (ref 3.9–10.3)

## 2017-05-21 LAB — CEA (IN HOUSE-CHCC): CEA (CHCC-IN HOUSE): 1.27 ng/mL (ref 0.00–5.00)

## 2017-05-22 ENCOUNTER — Encounter (HOSPITAL_COMMUNITY): Payer: Self-pay

## 2017-05-22 ENCOUNTER — Ambulatory Visit (HOSPITAL_COMMUNITY)
Admission: RE | Admit: 2017-05-22 | Discharge: 2017-05-22 | Disposition: A | Discharge status: Home / Self Care | Payer: Medicare Other | Source: Ambulatory Visit | Attending: Hematology and Oncology | Admitting: Hematology and Oncology

## 2017-05-22 DIAGNOSIS — R918 Other nonspecific abnormal finding of lung field: Secondary | ICD-10-CM | POA: Insufficient documentation

## 2017-05-22 DIAGNOSIS — C189 Malignant neoplasm of colon, unspecified: Secondary | ICD-10-CM | POA: Diagnosis not present

## 2017-05-22 DIAGNOSIS — K862 Cyst of pancreas: Secondary | ICD-10-CM | POA: Insufficient documentation

## 2017-05-22 DIAGNOSIS — I700 Atherosclerosis of aorta: Secondary | ICD-10-CM | POA: Diagnosis not present

## 2017-05-22 DIAGNOSIS — Z9049 Acquired absence of other specified parts of digestive tract: Secondary | ICD-10-CM | POA: Insufficient documentation

## 2017-05-22 DIAGNOSIS — N8189 Other female genital prolapse: Secondary | ICD-10-CM | POA: Insufficient documentation

## 2017-05-22 MED ORDER — IOPAMIDOL (ISOVUE-300) INJECTION 61%
INTRAVENOUS | Status: AC
  Filled 2017-05-22: qty 100

## 2017-05-22 MED ORDER — IOPAMIDOL (ISOVUE-300) INJECTION 61%
100.0000 mL | Freq: Once | INTRAVENOUS | Status: AC | PRN
  Administered 2017-05-22: 100 mL via INTRAVENOUS

## 2017-05-28 ENCOUNTER — Telehealth: Payer: Self-pay | Admitting: Hematology and Oncology

## 2017-05-28 ENCOUNTER — Inpatient Hospital Stay (HOSPITAL_BASED_OUTPATIENT_CLINIC_OR_DEPARTMENT_OTHER): Payer: Medicare Other | Admitting: Hematology and Oncology

## 2017-05-28 VITALS — BP 128/74 | HR 99 | Temp 98.4°F | Resp 17 | Ht 64.5 in | Wt 218.9 lb

## 2017-05-28 DIAGNOSIS — Z79899 Other long term (current) drug therapy: Secondary | ICD-10-CM | POA: Diagnosis not present

## 2017-05-28 DIAGNOSIS — C50512 Malignant neoplasm of lower-outer quadrant of left female breast: Secondary | ICD-10-CM

## 2017-05-28 DIAGNOSIS — Z9012 Acquired absence of left breast and nipple: Secondary | ICD-10-CM | POA: Diagnosis not present

## 2017-05-28 DIAGNOSIS — N951 Menopausal and female climacteric states: Secondary | ICD-10-CM | POA: Diagnosis not present

## 2017-05-28 DIAGNOSIS — Z170 Estrogen receptor positive status [ER+]: Secondary | ICD-10-CM | POA: Diagnosis not present

## 2017-05-28 DIAGNOSIS — Z85038 Personal history of other malignant neoplasm of large intestine: Secondary | ICD-10-CM

## 2017-05-28 DIAGNOSIS — C189 Malignant neoplasm of colon, unspecified: Secondary | ICD-10-CM

## 2017-05-28 DIAGNOSIS — M1990 Unspecified osteoarthritis, unspecified site: Secondary | ICD-10-CM

## 2017-05-28 MED ORDER — ALENDRONATE SODIUM 70 MG PO TABS: 70.0000 mg | ORAL_TABLET | ORAL | Status: AC

## 2017-05-28 NOTE — Progress Notes (Signed)
Patient Care Team: Leanna Battles, MD as PCP - General (Internal Medicine)  DIAGNOSIS:  Encounter Diagnoses  Name Primary?  . Primary cancer of lower outer quadrant of left female breast ([place]) Yes  . Primary colon cancer ([place])     SUMMARY OF ONCOLOGIC HISTORY:   Primary cancer of lower outer quadrant of left female breast ([place])   01/04/2012 Surgery    Left mastectomy: Invasive ductal carcinoma 0.5 cm with high-grade DCIS 9 cm, margins negative, 0/6 lymph nodes, ER 100%, PR 100%, Ki-67 17%, HER-2 negative ratio 1.16      02/07/2012 - 05/28/2016 Anti-estrogen oral therapy    Anastrozole 1 mg daily (stopped early due to Osteoporosis)       Primary colon cancer ([place])   03/20/2014 Initial Diagnosis    Invasive adenocarcinoma of Caecum      04/28/2014 Surgery    Right hemicolectomy: Invasive adenocarcinoma with mucinous features moderately differentiated invading subserosal tissue, margins negative, 25 lymph nodes negative, T3 N0 M0 stage II a, no microsatellite instability       CHIEF COMPLIANT: Follow-up of breast and colon cancers  INTERVAL HISTORY: Ann Maxwell is a 77 year old with above-mentioned history of colon cancer diagnosed in 2015 and had a hemicolectomy and is currently under surveillance.  She had a recent CT of her abdomen and pelvis and blood work including CEA.  She is here also to follow-up on her breast cancer history and she had left mastectomy.  She could not finish 5 years of antiestrogen therapy because of osteoporosis.  She is currently on Fosamax and calcium and vitamin D.  REVIEW OF SYSTEMS:   Constitutional: Denies fevers, chills or abnormal weight loss Eyes: Denies blurriness of vision Ears, nose, mouth, throat, and face: Denies mucositis or sore throat Respiratory: Denies cough, dyspnea or wheezes Cardiovascular: Denies palpitation, chest discomfort Gastrointestinal:  Denies nausea, heartburn or change in bowel habits Skin: Denies abnormal skin  rashes Lymphatics: Denies new lymphadenopathy or easy bruising Neurological:Denies numbness, tingling or new weaknesses Behavioral/Psych: Mood is stable, no new changes  Extremities: No lower extremity edema Breast: Left mastectomy occasionally causing phantom pains. All other systems were reviewed with the patient and are negative.  I have reviewed the past medical history, past surgical history, social history and family history with the patient and they are unchanged from previous note.  ALLERGIES:  is allergic to acyclovir and related; hydrocodone; adhesive [tape]; and terramycin [oxytetracycline].  MEDICATIONS:  Current Outpatient Medications  Medication Sig Dispense Refill  . acetaminophen (TYLENOL) 500 MG tablet Take 1,000 mg by mouth every 6 (six) hours as needed (For pain).    . ADVAIR DISKUS 250-50 MCG/DOSE AEPB Inhale 1 puff into the lungs 2 (two) times daily. 180 each 0  . albuterol (PROVENTIL HFA;VENTOLIN HFA) 108 (90 BASE) MCG/ACT inhaler Inhale 2 puffs into the lungs every 4 (four) hours as needed for wheezing or shortness of breath.    Marland Kitchen alendronate (FOSAMAX) 70 MG tablet Take 1 tablet (70 mg total) by mouth once a week. Take with a full glass of water on an empty stomach.    . hydrocortisone (ANUSOL-HC) 2.5 % rectal cream Place 1 application rectally 3 (three) times daily as needed for hemorrhoids or itching.     Marland Kitchen ipratropium-albuterol (DUONEB) 0.5-2.5 (3) MG/3ML SOLN Take 3 mLs by nebulization every 6 (six) hours as needed (For shortness of breath.).     Marland Kitchen loratadine (CLARITIN) 10 MG tablet Take 10 mg by mouth daily as needed for  allergies.    Marland Kitchen losartan-hydrochlorothiazide (HYZAAR) 50-12.5 MG per tablet Take 1 tablet by mouth daily.    . montelukast (SINGULAIR) 10 MG tablet Take 10 mg by mouth at bedtime.    . polyvinyl alcohol (LIQUIFILM TEARS) 1.4 % ophthalmic solution Place 2 drops into both eyes as needed for dry eyes.     No current facility-administered medications  for this visit.     PHYSICAL EXAMINATION: ECOG PERFORMANCE STATUS: 1 - Symptomatic but completely ambulatory  Vitals:   05/28/17 0851  BP: 128/74  Pulse: 99  Resp: 17  Temp: 98.4 F (36.9 C)  SpO2: 96%   Filed Weights   05/28/17 0851  Weight: 218 lb 14.4 oz (99.3 kg)    GENERAL:alert, no distress and comfortable SKIN: skin color, texture, turgor are normal, no rashes or significant lesions EYES: normal, Conjunctiva are pink and non-injected, sclera clear OROPHARYNX:no exudate, no erythema and lips, buccal mucosa, and tongue normal  NECK: supple, thyroid normal size, non-tender, without nodularity LYMPH:  no palpable lymphadenopathy in the cervical, axillary or inguinal LUNGS: clear to auscultation and percussion with normal breathing effort HEART: regular rate & rhythm and no murmurs and no lower extremity edema ABDOMEN:abdomen soft, non-tender and normal bowel sounds MUSCULOSKELETAL:no cyanosis of digits and no clubbing  NEURO: alert & oriented x 3 with fluent speech, no focal motor/sensory deficits EXTREMITIES: No lower extremity edema BREAST: No palpable masses or nodules in either right or left breasts. No palpable axillary supraclavicular or infraclavicular adenopathy no breast tenderness or nipple discharge. (exam performed in the presence of a chaperone)  LABORATORY DATA:  I have reviewed the data as listed CMP Latest Ref Rng & Units 05/21/2017 05/05/2016 08/25/2015  Glucose 70 - 140 mg/dL 94 115 128(H)  BUN 7 - 26 mg/dL 13 17.5 12  Creatinine 0.60 - 1.10 mg/dL 0.77 0.8 0.64  Sodium 136 - 145 mmol/L 140 140 140  Potassium 3.5 - 5.1 mmol/L 3.9 3.5 3.7  Chloride 98 - 109 mmol/L 103 - 102  CO2 22 - 29 mmol/L 30(H) 25 29  Calcium 8.4 - 10.4 mg/dL 9.5 10.0 8.7(L)  Total Protein 6.4 - 8.3 g/dL 6.8 7.8 -  Total Bilirubin 0.2 - 1.2 mg/dL 0.5 0.32 -  Alkaline Phos 40 - 150 U/L 78 113 -  AST 5 - 34 U/L 17 20 -  ALT 0 - 55 U/L 19 23 -    Lab Results  Component Value Date     WBC 8.7 05/21/2017   HGB 14.4 05/21/2017   HCT 44.7 05/21/2017   MCV 91.2 05/21/2017   PLT 243 05/21/2017   NEUTROABS 6.7 (H) 05/21/2017    ASSESSMENT & PLAN:  Primary cancer of lower outer quadrant of left female breast Left breast cancer invasive ductal carcinoma: 0.5 cm with 9 mm area of DCIS status post left mastectomy 01/04/2012 ER/PR positive HER-2 negative Ki-67 17% currently on Arimidex 1 mg daily Since 02/07/2012.  Arimidex toxicities: 1. Hot flashes: She takes gabapentin for hot flashes. 2. Arthritis in her knees: Left knee arthroplasty May 2017 Patient will complete 5 years of therapy by October 2018. I recommended that she continue antiestrogen therapy for 2 more years based on ABCSG 16 data.  Breast cancer surveillance: 1. Breast exam 05/28/2017: No palpable areas of concern.  Left mastectomy. 2. Rt Mammogram  02/16/2017 is normal Breast density category D  Return to clinic in 1 yr for follow-up    Primary colon cancer Cecal adenocarcinoma diagnosed 04/22/2013, preop CEA  of 5.1, status post right hemicolectomy 04/28/2014, grade 2, no MVI, 0/25 lymph nodes, subserosal involvement, T3 N0 M0 stage II A, no mismatch repair  Current treatment: Surveillance with CEA and Colonoscopy and CT scan in one year in February 2019:  CT scan February 2019: Revealed pancreatic cysts No evidence of malignancy CEA 1.2 Return to clinic in 1 year for follow-up after scans and labs    I spent 25 minutes talking to the patient of which more than half was spent in counseling and coordination of care.  Orders Placed This Encounter  Procedures  . CT Abdomen Pelvis W Contrast    Standing Status:   Future    Standing Expiration Date:   05/28/2018    Order Specific Question:   If indicated for the ordered procedure, I authorize the administration of contrast media per Radiology protocol    Answer:   Yes    Order Specific Question:   Preferred imaging location?    Answer:   [facility]    Order Specific Question:   Radiology Contrast Protocol - do NOT remove file path    Answer:   [email or web address]    Order Specific Question:   Reason for Exam additional comments    Answer:   Colonc cancer history and pancreatic cyst follow up  . CEA (IN HOUSE-[facility])    Standing Status:   Future    Standing Expiration Date:   05/28/2018  . CBC with Differential (Cancer Center Only)    Standing Status:   Future    Standing Expiration Date:   05/28/2018  . CMP ([place] only)    Standing Status:   Future    Standing Expiration Date:   05/28/2018   The patient has a good understanding of the overall plan. she agrees with it. she will call with any problems that may develop before the next visit here.   Harriette Ohara, MD 05/28/17

## 2017-05-28 NOTE — Assessment & Plan Note (Signed)
Cecal adenocarcinoma diagnosed 04/22/2013, preop CEA of 5.1, status post right hemicolectomy 04/28/2014, grade 2, no MVI, 0/25 lymph nodes, subserosal involvement, T3 N0 M0 stage II A, no mismatch repair  Current treatment: Surveillance with CEA and Colonoscopy and CT scan in one year in February 2019:  CT scan February 2019: Revealed pancreatic cysts No evidence of malignancy CEA 1.2 Return to clinic in 1 year for follow-up after scans and labs

## 2017-05-28 NOTE — Telephone Encounter (Signed)
Gave patient AVs and calendar of upcoming February 2020 appointments. °

## 2017-05-28 NOTE — Assessment & Plan Note (Signed)
Left breast cancer invasive ductal carcinoma: 0.5 cm with 9 mm area of DCIS status post left mastectomy 01/04/2012 ER/PR positive HER-2 negative Ki-67 17% currently on Arimidex 1 mg daily Since 02/07/2012.  Arimidex toxicities: 1. Hot flashes: She takes gabapentin for hot flashes. 2. Arthritis in her knees: Left knee arthroplasty May 2017 Patient will complete 5 years of therapy by October 2018. I recommended that she continue antiestrogen therapy for 2 more years based on ABCSG 16 data.  Breast cancer surveillance: 1. Breast exam 05/28/2017: No palpable areas of concern.  Left mastectomy. 2. Rt Mammogram  02/16/2017 is normal Breast density category D  Return to clinic in 1 yr for follow-up

## 2017-10-17 DIAGNOSIS — J45909 Unspecified asthma, uncomplicated: Secondary | ICD-10-CM | POA: Insufficient documentation

## 2018-01-10 ENCOUNTER — Other Ambulatory Visit: Payer: Self-pay | Admitting: Hematology and Oncology

## 2018-01-10 DIAGNOSIS — Z1231 Encounter for screening mammogram for malignant neoplasm of breast: Secondary | ICD-10-CM

## 2018-01-10 DIAGNOSIS — Z9012 Acquired absence of left breast and nipple: Secondary | ICD-10-CM

## 2018-02-19 ENCOUNTER — Ambulatory Visit
Admission: RE | Admit: 2018-02-19 | Discharge: 2018-02-19 | Disposition: A | Discharge status: Home / Self Care | Payer: Medicare Other | Source: Ambulatory Visit | Attending: Hematology and Oncology | Admitting: Hematology and Oncology

## 2018-02-19 DIAGNOSIS — Z1231 Encounter for screening mammogram for malignant neoplasm of breast: Secondary | ICD-10-CM

## 2018-02-19 DIAGNOSIS — Z9012 Acquired absence of left breast and nipple: Secondary | ICD-10-CM

## 2018-05-20 ENCOUNTER — Inpatient Hospital Stay: Payer: Medicare Other | Attending: Hematology and Oncology

## 2018-05-20 DIAGNOSIS — Z170 Estrogen receptor positive status [ER+]: Secondary | ICD-10-CM | POA: Insufficient documentation

## 2018-05-20 DIAGNOSIS — Z79811 Long term (current) use of aromatase inhibitors: Secondary | ICD-10-CM | POA: Diagnosis not present

## 2018-05-20 DIAGNOSIS — Z9012 Acquired absence of left breast and nipple: Secondary | ICD-10-CM | POA: Diagnosis not present

## 2018-05-20 DIAGNOSIS — Z85038 Personal history of other malignant neoplasm of large intestine: Secondary | ICD-10-CM | POA: Diagnosis not present

## 2018-05-20 DIAGNOSIS — C50512 Malignant neoplasm of lower-outer quadrant of left female breast: Secondary | ICD-10-CM | POA: Insufficient documentation

## 2018-05-20 DIAGNOSIS — C189 Malignant neoplasm of colon, unspecified: Secondary | ICD-10-CM

## 2018-05-20 LAB — CMP (CANCER CENTER ONLY)
ALK PHOS: 75 U/L (ref 38–126)
ALT: 30 U/L (ref 0–44)
ANION GAP: 7 (ref 5–15)
AST: 24 U/L (ref 15–41)
Albumin: 3.7 g/dL (ref 3.5–5.0)
BILIRUBIN TOTAL: 0.4 mg/dL (ref 0.3–1.2)
BUN: 22 mg/dL (ref 8–23)
CALCIUM: 9.5 mg/dL (ref 8.9–10.3)
CO2: 30 mmol/L (ref 22–32)
Chloride: 103 mmol/L (ref 98–111)
Creatinine: 0.82 mg/dL (ref 0.44–1.00)
GLUCOSE: 104 mg/dL — AB (ref 70–99)
Potassium: 3.2 mmol/L — ABNORMAL LOW (ref 3.5–5.1)
Sodium: 140 mmol/L (ref 135–145)
TOTAL PROTEIN: 7 g/dL (ref 6.5–8.1)

## 2018-05-20 LAB — CBC WITH DIFFERENTIAL (CANCER CENTER ONLY)
Abs Immature Granulocytes: 0.25 10*3/uL — ABNORMAL HIGH (ref 0.00–0.07)
Basophils Absolute: 0.1 10*3/uL (ref 0.0–0.1)
Basophils Relative: 0 %
EOS ABS: 0.2 10*3/uL (ref 0.0–0.5)
EOS PCT: 1 %
HEMATOCRIT: 43.9 % (ref 36.0–46.0)
HEMOGLOBIN: 14.3 g/dL (ref 12.0–15.0)
Immature Granulocytes: 2 %
LYMPHS ABS: 1.7 10*3/uL (ref 0.7–4.0)
LYMPHS PCT: 13 %
MCH: 29.1 pg (ref 26.0–34.0)
MCHC: 32.6 g/dL (ref 30.0–36.0)
MCV: 89.4 fL (ref 80.0–100.0)
MONO ABS: 0.8 10*3/uL (ref 0.1–1.0)
Monocytes Relative: 6 %
NEUTROS PCT: 78 %
NRBC: 0 % (ref 0.0–0.2)
Neutro Abs: 9.9 10*3/uL — ABNORMAL HIGH (ref 1.7–7.7)
Platelet Count: 283 10*3/uL (ref 150–400)
RBC: 4.91 MIL/uL (ref 3.87–5.11)
RDW: 12.6 % (ref 11.5–15.5)
WBC: 12.8 10*3/uL — AB (ref 4.0–10.5)

## 2018-05-20 LAB — CEA (IN HOUSE-CHCC): CEA (CHCC-In House): 1.73 ng/mL (ref 0.00–5.00)

## 2018-05-22 ENCOUNTER — Telehealth: Payer: Self-pay

## 2018-05-22 NOTE — Telephone Encounter (Signed)
-----   Message from Gardenia Phlegm, NP sent at 05/21/2018  8:57 AM EST ----- Potassium is slightly low.  Is patient having any diarrhea?  If not recommend increasing the potassium in her diet. ----- Message ----- From: Interface, Lab In [place] Sent: 05/20/2018   8:51 AM EST To: Nicholas Lose, MD

## 2018-05-22 NOTE — Telephone Encounter (Signed)
Spoke with patient today, (LVM to return call 2/3).  Informed her that her potassium is slightly low and per NP recommendations to increase potassium in diet.  Patient just started an antibiotic and is having some diarrhea due to medication but otherwise was not experiencing beforehand.  Patient knows to call center with any issues/concerns.

## 2018-05-27 ENCOUNTER — Inpatient Hospital Stay: Payer: Medicare Other | Admitting: Hematology and Oncology

## 2018-05-27 ENCOUNTER — Telehealth: Payer: Self-pay | Admitting: Hematology and Oncology

## 2018-05-27 ENCOUNTER — Other Ambulatory Visit: Payer: Self-pay

## 2018-05-27 ENCOUNTER — Telehealth: Payer: Self-pay

## 2018-05-27 DIAGNOSIS — C50512 Malignant neoplasm of lower-outer quadrant of left female breast: Secondary | ICD-10-CM

## 2018-05-27 DIAGNOSIS — Z9012 Acquired absence of left breast and nipple: Secondary | ICD-10-CM | POA: Diagnosis not present

## 2018-05-27 DIAGNOSIS — Z85038 Personal history of other malignant neoplasm of large intestine: Secondary | ICD-10-CM | POA: Diagnosis not present

## 2018-05-27 DIAGNOSIS — C189 Malignant neoplasm of colon, unspecified: Secondary | ICD-10-CM

## 2018-05-27 DIAGNOSIS — Z170 Estrogen receptor positive status [ER+]: Secondary | ICD-10-CM | POA: Diagnosis not present

## 2018-05-27 DIAGNOSIS — Z79811 Long term (current) use of aromatase inhibitors: Secondary | ICD-10-CM

## 2018-05-27 NOTE — Assessment & Plan Note (Signed)
Left breast cancer invasive ductal carcinoma: 0.5 cm with 9 mm area of DCIS status post left mastectomy 01/04/2012 ER/PR positive HER-2 negative Ki-67 17% currently on Arimidex 1 mg daily Since 02/07/2012.  Arimidex toxicities: 1. Hot flashes: She takes gabapentin for hot flashes. 2. Arthritis in her knees:Left knee arthroplasty May 2017 Patient completed 5 years of therapy by October 2018. I recommended that she continue antiestrogen therapy for 2 more years based onABCSG 16 data which will be October 2020.  Breast cancer surveillance: 1. Breast exam2/01/2019: No palpable areas of concern.  Left mastectomy. 2.RtMammogram  11/6/2019is normal Breast density categoryC  Return to clinic in1 yrfor follow-up 

## 2018-05-27 NOTE — Progress Notes (Signed)
Ct abd

## 2018-05-27 NOTE — Assessment & Plan Note (Signed)
Cecal adenocarcinoma diagnosed 04/22/2013, preop CEA of 5.1, status post right hemicolectomy 04/28/2014, grade 2, no MVI, 0/25 lymph nodes, subserosal involvement, T3 N0 M0 stage II A, no mismatch repair  Current treatment: Surveillance with CEA and Colonoscopy and CT scan in one year in February 2019:  CT scan February 2019: Revealed pancreatic cysts No evidence of malignancy CT scans for 2020 have not been done yet. CEA 1.2 Return to clinic in 1 year for follow-up after scans and labs

## 2018-05-27 NOTE — Progress Notes (Signed)
Patient Care Team: Leanna Battles, MD as PCP - General (Internal Medicine)  DIAGNOSIS:  Encounter Diagnoses  Name Primary?  . Primary cancer of lower outer quadrant of left female breast ([place])   . Primary colon cancer ([place])     SUMMARY OF ONCOLOGIC HISTORY:   Primary cancer of lower outer quadrant of left female breast ([place])   01/04/2012 Surgery    Left mastectomy: Invasive ductal carcinoma 0.5 cm with high-grade DCIS 9 cm, margins negative, 0/6 lymph nodes, ER 100%, PR 100%, Ki-67 17%, HER-2 negative ratio 1.16    02/07/2012 - 05/28/2016 Anti-estrogen oral therapy    Anastrozole 1 mg daily (stopped early due to Osteoporosis)     Primary colon cancer ([place])   03/20/2014 Initial Diagnosis    Invasive adenocarcinoma of Caecum    04/28/2014 Surgery    Right hemicolectomy: Invasive adenocarcinoma with mucinous features moderately differentiated invading subserosal tissue, margins negative, 25 lymph nodes negative, T3 N0 M0 stage II a, no microsatellite instability     CHIEF COMPLIANT: Follow-up and surveillance of breast and colon cancers  INTERVAL HISTORY: Ann Maxwell is a 78 year old with the above history left breast cancer  is currently in surveillance after completion of adjuvant antiestrogen therapy with anastrozole.  She denies any lumps or nodules in the breast.  She had colon cancer diagnosed 2015 and had surgery in January 2016.  It has been 4 years from the colon cancer.  Denies any problems bleeding or other concerns.  She has not had her CT scan for this year yet.  REVIEW OF SYSTEMS:   Constitutional: Denies fevers, chills or abnormal weight loss Eyes: Denies blurriness of vision Ears, nose, mouth, throat, and face: Denies mucositis or sore throat Respiratory: Denies cough, dyspnea or wheezes Cardiovascular: Denies palpitation, chest discomfort Gastrointestinal:  Denies nausea, heartburn or change in bowel habits Skin: Denies abnormal skin rashes Lymphatics:  Denies new lymphadenopathy or easy bruising Neurological:Denies numbness, tingling or new weaknesses Behavioral/Psych: Mood is stable, no new changes  Extremities: No lower extremity edema Breast:  denies any pain or lumps or nodules in either breasts All other systems were reviewed with the patient and are negative.  I have reviewed the past medical history, past surgical history, social history and family history with the patient and they are unchanged from previous note.  ALLERGIES:  is allergic to acyclovir and related; hydrocodone; adhesive [tape]; and terramycin [oxytetracycline].  MEDICATIONS:  Current Outpatient Medications  Medication Sig Dispense Refill  . acetaminophen (TYLENOL) 500 MG tablet Take 1,000 mg by mouth every 6 (six) hours as needed (For pain).    . ADVAIR DISKUS 250-50 MCG/DOSE AEPB Inhale 1 puff into the lungs 2 (two) times daily. 180 each 0  . albuterol (PROVENTIL HFA;VENTOLIN HFA) 108 (90 BASE) MCG/ACT inhaler Inhale 2 puffs into the lungs every 4 (four) hours as needed for wheezing or shortness of breath.    Marland Kitchen alendronate (FOSAMAX) 70 MG tablet Take 1 tablet (70 mg total) by mouth once a week. Take with a full glass of water on an empty stomach.    . hydrocortisone (ANUSOL-HC) 2.5 % rectal cream Place 1 application rectally 3 (three) times daily as needed for hemorrhoids or itching.     Marland Kitchen ipratropium-albuterol (DUONEB) 0.5-2.5 (3) MG/3ML SOLN Take 3 mLs by nebulization every 6 (six) hours as needed (For shortness of breath.).     Marland Kitchen loratadine (CLARITIN) 10 MG tablet Take 10 mg by mouth daily as needed for allergies.    Marland Kitchen  losartan-hydrochlorothiazide (HYZAAR) 50-12.5 MG per tablet Take 1 tablet by mouth daily.    . montelukast (SINGULAIR) 10 MG tablet Take 10 mg by mouth at bedtime.    . polyvinyl alcohol (LIQUIFILM TEARS) 1.4 % ophthalmic solution Place 2 drops into both eyes as needed for dry eyes.     No current facility-administered medications for this visit.      PHYSICAL EXAMINATION: ECOG PERFORMANCE STATUS: 1 - Symptomatic but completely ambulatory  Vitals:   05/27/18 0921  BP: (!) 141/69  Pulse: 95  Resp: 18  Temp: 98.4 F (36.9 C)  SpO2: 96%   Filed Weights   05/27/18 0921  Weight: 213 lb 1.6 oz (96.7 kg)    GENERAL:alert, no distress and comfortable SKIN: skin color, texture, turgor are normal, no rashes or significant lesions EYES: normal, Conjunctiva are pink and non-injected, sclera clear OROPHARYNX:no exudate, no erythema and lips, buccal mucosa, and tongue normal  NECK: supple, thyroid normal size, non-tender, without nodularity LYMPH:  no palpable lymphadenopathy in the cervical, axillary or inguinal LUNGS: clear to auscultation and percussion with normal breathing effort HEART: regular rate & rhythm and no murmurs and no lower extremity edema ABDOMEN:abdomen soft, non-tender and normal bowel sounds MUSCULOSKELETAL:no cyanosis of digits and no clubbing  NEURO: alert & oriented x 3 with fluent speech, no focal motor/sensory deficits EXTREMITIES: No lower extremity edema BREAST: No palpable masses or nodules in either right or left breasts. No palpable axillary supraclavicular or infraclavicular adenopathy no breast tenderness or nipple discharge. (exam performed in the presence of a chaperone)  LABORATORY DATA:  I have reviewed the data as listed CMP Latest Ref Rng & Units 05/20/2018 05/21/2017 05/05/2016  Glucose 70 - 99 mg/dL 104(H) 94 115  BUN 8 - 23 mg/dL 22 13 17.5  Creatinine 0.44 - 1.00 mg/dL 0.82 0.77 0.8  Sodium 135 - 145 mmol/L 140 140 140  Potassium 3.5 - 5.1 mmol/L 3.2(L) 3.9 3.5  Chloride 98 - 111 mmol/L 103 103 -  CO2 22 - 32 mmol/L 30 30(H) 25  Calcium 8.9 - 10.3 mg/dL 9.5 9.5 10.0  Total Protein 6.5 - 8.1 g/dL 7.0 6.8 7.8  Total Bilirubin 0.3 - 1.2 mg/dL 0.4 0.5 0.32  Alkaline Phos 38 - 126 U/L 75 78 113  AST 15 - 41 U/L _0 ALT 0 - 44 U/L _1 Lab Results  Component Value Date   WBC  12.8 (H) 05/20/2018   HGB 14.3 05/20/2018   HCT 43.9 05/20/2018   MCV 89.4 05/20/2018   PLT 283 05/20/2018   NEUTROABS 9.9 (H) 05/20/2018    ASSESSMENT & PLAN:  Primary cancer of lower outer quadrant of left female breast Left breast cancer invasive ductal carcinoma: 0.5 cm with 9 mm area of DCIS status post left mastectomy 01/04/2012 ER/PR positive HER-2 negative Ki-67 17% currently on Arimidex 1 mg daily Since 02/07/2012.  Arimidex toxicities: 1. Hot flashes: She takes gabapentin for hot flashes. 2. Arthritis in her knees:Left knee arthroplasty May 2017 Patient completed 5 years of therapy by October 2018. I recommended that she continue antiestrogen therapy for 2 more years based onABCSG 16 data which will be October 2020.  Breast cancer surveillance: 1. Breast exam2/01/2019: No palpable areas of concern.  Left mastectomy. 2.RtMammogram  11/6/2019is normal Breast density categoryC  Return to clinic in1 yrfor follow-up  Primary colon cancer Cecal adenocarcinoma diagnosed 04/22/2013, preop CEA of 5.1, status post right hemicolectomy 04/28/2014, grade 2, no MVI,  0/25 lymph nodes, subserosal involvement, T3 N0 M0 stage II A, no mismatch repair  Current treatment: Surveillance with CEA and Colonoscopy and CT scan in one year in February 2019:  CT scan February 2019: Revealed pancreatic cysts No evidence of malignancy CT scans for 2020 have not been done yet.  I will call her with the result of the scan. CEA 1.2 Return to clinic in 1 year for follow-up with labs.  She does not need any further scans after this years.     Orders Placed This Encounter  Procedures  . CBC with Differential (Cancer Center Only)    Standing Status:   Future    Standing Expiration Date:   05/28/2019  . CMP ([place] only)    Standing Status:   Future    Standing Expiration Date:   05/28/2019  . CEA (IN HOUSE-[facility])    Standing Status:   Future    Standing Expiration Date:    05/28/2019   The patient has a good understanding of the overall plan. she agrees with it. she will call with any problems that may develop before the next visit here.   Harriette Ohara, MD 05/27/18

## 2018-05-27 NOTE — Telephone Encounter (Signed)
Patient decline avs and calendar °

## 2018-05-27 NOTE — Telephone Encounter (Signed)
Pt notified of CT scan scheduled for 06/24/2018 @ 9am.  Notified of need for NPO 4 hours prior to exam - pt will pick up oral contrast.  Instructed on proper prep.  Voiced understanding, no further needs.

## 2018-06-14 ENCOUNTER — Encounter: Payer: Self-pay | Admitting: Gastroenterology

## 2018-06-24 ENCOUNTER — Ambulatory Visit (HOSPITAL_COMMUNITY)
Admission: RE | Admit: 2018-06-24 | Discharge: 2018-06-24 | Disposition: A | Discharge status: Home / Self Care | Payer: Medicare Other | Source: Ambulatory Visit | Attending: Hematology and Oncology | Admitting: Hematology and Oncology

## 2018-06-24 ENCOUNTER — Encounter (HOSPITAL_COMMUNITY): Payer: Self-pay

## 2018-06-24 DIAGNOSIS — C50512 Malignant neoplasm of lower-outer quadrant of left female breast: Secondary | ICD-10-CM | POA: Insufficient documentation

## 2018-06-24 MED ORDER — IOHEXOL 300 MG/ML  SOLN
100.0000 mL | Freq: Once | INTRAMUSCULAR | Status: AC | PRN
  Administered 2018-06-24: 100 mL via INTRAVENOUS

## 2018-06-24 MED ORDER — SODIUM CHLORIDE (PF) 0.9 % IJ SOLN
INTRAMUSCULAR | Status: AC
  Filled 2018-06-24: qty 50

## 2019-02-06 ENCOUNTER — Other Ambulatory Visit: Payer: Self-pay | Admitting: Internal Medicine

## 2019-02-06 DIAGNOSIS — Z1231 Encounter for screening mammogram for malignant neoplasm of breast: Secondary | ICD-10-CM

## 2019-04-01 ENCOUNTER — Other Ambulatory Visit: Payer: Self-pay

## 2019-04-01 ENCOUNTER — Ambulatory Visit
Admission: RE | Admit: 2019-04-01 | Discharge: 2019-04-01 | Disposition: A | Discharge status: Home / Self Care | Payer: Medicare Other | Source: Ambulatory Visit | Attending: Internal Medicine | Admitting: Internal Medicine

## 2019-04-01 DIAGNOSIS — Z1231 Encounter for screening mammogram for malignant neoplasm of breast: Secondary | ICD-10-CM

## 2019-04-02 ENCOUNTER — Other Ambulatory Visit: Payer: Self-pay | Admitting: Internal Medicine

## 2019-04-02 DIAGNOSIS — R928 Other abnormal and inconclusive findings on diagnostic imaging of breast: Secondary | ICD-10-CM

## 2019-04-09 ENCOUNTER — Other Ambulatory Visit: Payer: Self-pay

## 2019-04-09 ENCOUNTER — Ambulatory Visit
Admission: RE | Admit: 2019-04-09 | Discharge: 2019-04-09 | Disposition: A | Discharge status: Home / Self Care | Payer: Medicare Other | Source: Ambulatory Visit | Attending: Internal Medicine | Admitting: Internal Medicine

## 2019-04-09 DIAGNOSIS — R928 Other abnormal and inconclusive findings on diagnostic imaging of breast: Secondary | ICD-10-CM

## 2019-05-08 ENCOUNTER — Ambulatory Visit: Payer: Medicare PPO | Attending: Internal Medicine

## 2019-05-08 DIAGNOSIS — Z23 Encounter for immunization: Secondary | ICD-10-CM | POA: Insufficient documentation

## 2019-05-08 NOTE — Progress Notes (Signed)
   Covid-19 Vaccination Clinic  Name:  Ann Maxwell    MRN: [id number] DOB: 23-Apr-1940  05/08/2019  Ann Maxwell was observed post Covid-19 immunization for 15 minutes without incidence. She was provided with Vaccine Information Sheet and instruction to access the V-Safe system.   Ann Maxwell was instructed to call 911 with any severe reactions post vaccine: Marland Kitchen Difficulty breathing  . Swelling of your face and throat  . A fast heartbeat  . A bad rash all over your body  . Dizziness and weakness    Immunizations Administered    Name Date Dose VIS Date Route   Pfizer COVID-19 Vaccine 05/08/2019  9:51 AM 0.3 mL 03/28/2019 Intramuscular   Manufacturer: [place]   Lot: [id number]   [place]: [id number]

## 2019-05-19 ENCOUNTER — Telehealth: Payer: Self-pay | Admitting: Hematology and Oncology

## 2019-05-19 NOTE — Telephone Encounter (Signed)
Returned patient's phone call regarding rescheduling 02/03 and 02/10 appointments, per patient's request appointments have moved to 02/24 and 03/03.

## 2019-05-21 ENCOUNTER — Other Ambulatory Visit: Payer: Medicare Other

## 2019-05-28 ENCOUNTER — Ambulatory Visit: Payer: Medicare Other | Admitting: Hematology and Oncology

## 2019-05-29 ENCOUNTER — Ambulatory Visit: Payer: Medicare PPO | Attending: Internal Medicine

## 2019-05-29 DIAGNOSIS — Z23 Encounter for immunization: Secondary | ICD-10-CM | POA: Insufficient documentation

## 2019-05-29 NOTE — Progress Notes (Signed)
   Covid-19 Vaccination Clinic  Name:  Ann Maxwell    MRN: [id number] DOB: Jan 03, 1941  05/29/2019  [name] was observed post Covid-19 immunization for 15 minutes without incidence. She was provided with Vaccine Information Sheet and instruction to access the V-Safe system.   [name] was instructed to call 911 with any severe reactions post vaccine: Marland Kitchen Difficulty breathing  . Swelling of your face and throat  . A fast heartbeat  . A bad rash all over your body  . Dizziness and weakness    Immunizations Administered    Name Date Dose VIS Date Route   Pfizer COVID-19 Vaccine 05/29/2019  8:32 AM 0.3 mL 03/28/2019 Intramuscular   Manufacturer: [place]   Lot: [id number]   [place]: [id number]

## 2019-06-10 ENCOUNTER — Other Ambulatory Visit: Payer: Self-pay | Admitting: *Deleted

## 2019-06-10 DIAGNOSIS — C50512 Malignant neoplasm of lower-outer quadrant of left female breast: Secondary | ICD-10-CM

## 2019-06-11 ENCOUNTER — Inpatient Hospital Stay: Payer: Medicare PPO | Attending: Hematology and Oncology

## 2019-06-11 ENCOUNTER — Other Ambulatory Visit: Payer: Self-pay

## 2019-06-11 DIAGNOSIS — C50512 Malignant neoplasm of lower-outer quadrant of left female breast: Secondary | ICD-10-CM | POA: Diagnosis not present

## 2019-06-11 DIAGNOSIS — Z170 Estrogen receptor positive status [ER+]: Secondary | ICD-10-CM | POA: Diagnosis not present

## 2019-06-11 LAB — CMP (CANCER CENTER ONLY)
ALT: 26 U/L (ref 0–44)
AST: 18 U/L (ref 15–41)
Albumin: 4 g/dL (ref 3.5–5.0)
Alkaline Phosphatase: 80 U/L (ref 38–126)
Anion gap: 11 (ref 5–15)
BUN: 18 mg/dL (ref 8–23)
CO2: 29 mmol/L (ref 22–32)
Calcium: 9.8 mg/dL (ref 8.9–10.3)
Chloride: 101 mmol/L (ref 98–111)
Creatinine: 0.81 mg/dL (ref 0.44–1.00)
GFR, Est AFR Am: >60 mL/min (ref >60)
GFR, Estimated: >60 mL/min (ref >60)
Glucose, Bld: 120 mg/dL — ABNORMAL HIGH (ref 70–99)
Potassium: 4 mmol/L (ref 3.5–5.1)
Sodium: 141 mmol/L (ref 135–145)
Total Bilirubin: 0.4 mg/dL (ref 0.3–1.2)
Total Protein: 7.7 g/dL (ref 6.5–8.1)

## 2019-06-11 LAB — CBC WITH DIFFERENTIAL (CANCER CENTER ONLY)
Abs Immature Granulocytes: 0.07 10*3/uL (ref 0.00–0.07)
Basophils Absolute: 0.1 10*3/uL (ref 0.0–0.1)
Basophils Relative: 1 %
Eosinophils Absolute: 0.3 10*3/uL (ref 0.0–0.5)
Eosinophils Relative: 4 %
HCT: 47.3 % — ABNORMAL HIGH (ref 36.0–46.0)
Hemoglobin: 15.3 g/dL — ABNORMAL HIGH (ref 12.0–15.0)
Immature Granulocytes: 1 %
Lymphocytes Relative: 12 %
Lymphs Abs: 0.9 10*3/uL (ref 0.7–4.0)
MCH: 29.5 pg (ref 26.0–34.0)
MCHC: 32.3 g/dL (ref 30.0–36.0)
MCV: 91.1 fL (ref 80.0–100.0)
Monocytes Absolute: 0.5 10*3/uL (ref 0.1–1.0)
Monocytes Relative: 7 %
Neutro Abs: 5.6 10*3/uL (ref 1.7–7.7)
Neutrophils Relative %: 75 %
Platelet Count: 273 10*3/uL (ref 150–400)
RBC: 5.19 MIL/uL — ABNORMAL HIGH (ref 3.87–5.11)
RDW: 13 % (ref 11.5–15.5)
WBC Count: 7.4 10*3/uL (ref 4.0–10.5)
nRBC: 0 % (ref 0.0–0.2)

## 2019-06-11 LAB — CEA (IN HOUSE-CHCC): CEA (CHCC-In House): 1.59 ng/mL (ref 0.00–5.00)

## 2019-06-12 DIAGNOSIS — M25561 Pain in right knee: Secondary | ICD-10-CM | POA: Insufficient documentation

## 2019-06-18 ENCOUNTER — Inpatient Hospital Stay: Payer: Medicare PPO | Attending: Hematology and Oncology | Admitting: Hematology and Oncology

## 2019-06-18 ENCOUNTER — Other Ambulatory Visit: Payer: Self-pay

## 2019-06-18 DIAGNOSIS — Z85038 Personal history of other malignant neoplasm of large intestine: Secondary | ICD-10-CM | POA: Diagnosis not present

## 2019-06-18 DIAGNOSIS — M818 Other osteoporosis without current pathological fracture: Secondary | ICD-10-CM | POA: Diagnosis not present

## 2019-06-18 DIAGNOSIS — Z79811 Long term (current) use of aromatase inhibitors: Secondary | ICD-10-CM | POA: Diagnosis not present

## 2019-06-18 DIAGNOSIS — Z170 Estrogen receptor positive status [ER+]: Secondary | ICD-10-CM | POA: Diagnosis not present

## 2019-06-18 DIAGNOSIS — C50512 Malignant neoplasm of lower-outer quadrant of left female breast: Secondary | ICD-10-CM | POA: Insufficient documentation

## 2019-06-18 NOTE — Assessment & Plan Note (Signed)
Left breast cancer invasive ductal carcinoma: 0.5 cm with 9 mm area of DCIS status post left mastectomy 01/04/2012 ER/PR positive HER-2 negative Ki-67 17% currently on Arimidex 1 mg daily Since 02/07/2012.  Completed 7 years of October 2020  Breast cancer surveillance: 1. Breast exam3/06/2019: No palpable areas of concern. Left mastectomy. 2.RtMammogram12/16/2020: Calcifications in the right breast, diagnostic mammogram: Benign right breast calcifications  Return to clinic in1 yrfor follow-up

## 2019-06-18 NOTE — Progress Notes (Signed)
Patient Care Team: Leanna Battles, MD as PCP - General (Internal Medicine)  DIAGNOSIS:    ICD-10-CM   1. Primary cancer of lower outer quadrant of left female breast ([place])  C50.512     SUMMARY OF ONCOLOGIC HISTORY: Oncology History  Primary cancer of lower outer quadrant of left female breast ([place])  01/04/2012 Surgery   Left mastectomy: Invasive ductal carcinoma 0.5 cm with high-grade DCIS 9 cm, margins negative, 0/6 lymph nodes, ER 100%, PR 100%, Ki-67 17%, HER-2 negative ratio 1.16   02/07/2012 - 05/28/2016 Anti-estrogen oral therapy   Anastrozole 1 mg daily (stopped early due to Osteoporosis)   Primary colon cancer ([place])  03/20/2014 Initial Diagnosis   Invasive adenocarcinoma of Caecum   04/28/2014 Surgery   Right hemicolectomy: Invasive adenocarcinoma with mucinous features moderately differentiated invading subserosal tissue, margins negative, 25 lymph nodes negative, T3 N0 M0 stage II a, no microsatellite instability     CHIEF COMPLIANT: Follow-up and surveillance of breast and colon cancers  INTERVAL HISTORY: Ann Maxwell is a 79 y.o. with above-mentioned history of left breast cancer who is currently on surveillance after completion of adjuvant antiestrogen therapy with anastrozole. She also has a history of colon cancer diagnosed in 2015 and had surgery in January 2016. CT abdomen on 06/24/18 showed no evidence of metastatic or recurrent disease. Mammogram on 04/01/19 showed right breast calcifications. Diagnostic mammogram on 04/09/19 showed benign calcifications. She presents to the clinic today for annual follow-up.   ALLERGIES:  is allergic to acyclovir and related; hydrocodone; adhesive [tape]; and terramycin [oxytetracycline].  MEDICATIONS:  Current Outpatient Medications  Medication Sig Dispense Refill  . acetaminophen (TYLENOL) 500 MG tablet Take 1,000 mg by mouth every 6 (six) hours as needed (For pain).    . ADVAIR DISKUS 250-50 MCG/DOSE AEPB Inhale 1 puff  into the lungs 2 (two) times daily. 180 each 0  . albuterol (PROVENTIL HFA;VENTOLIN HFA) 108 (90 BASE) MCG/ACT inhaler Inhale 2 puffs into the lungs every 4 (four) hours as needed for wheezing or shortness of breath.    Marland Kitchen alendronate (FOSAMAX) 70 MG tablet Take 1 tablet (70 mg total) by mouth once a week. Take with a full glass of water on an empty stomach.    . hydrocortisone (ANUSOL-HC) 2.5 % rectal cream Place 1 application rectally 3 (three) times daily as needed for hemorrhoids or itching.     Marland Kitchen ipratropium-albuterol (DUONEB) 0.5-2.5 (3) MG/3ML SOLN Take 3 mLs by nebulization every 6 (six) hours as needed (For shortness of breath.).     Marland Kitchen loratadine (CLARITIN) 10 MG tablet Take 10 mg by mouth daily as needed for allergies.    Marland Kitchen losartan-hydrochlorothiazide (HYZAAR) 50-12.5 MG per tablet Take 1 tablet by mouth daily.    . montelukast (SINGULAIR) 10 MG tablet Take 10 mg by mouth at bedtime.    . polyvinyl alcohol (LIQUIFILM TEARS) 1.4 % ophthalmic solution Place 2 drops into both eyes as needed for dry eyes.     No current facility-administered medications for this visit.    PHYSICAL EXAMINATION: ECOG PERFORMANCE STATUS: 1 - Symptomatic but completely ambulatory  Vitals:   06/18/19 1526  BP: (!) 146/84  Pulse: 94  Resp: 16  Temp: 98.5 F (36.9 C)  SpO2: 94%   Filed Weights   06/18/19 1526  Weight: 208 lb 12.8 oz (94.7 kg)    BREAST: No palpable masses or nodules in either right or left breasts. No palpable axillary supraclavicular or infraclavicular adenopathy no breast tenderness or  nipple discharge. (exam performed in the presence of a chaperone)  LABORATORY DATA:  I have reviewed the data as listed CMP Latest Ref Rng & Units 06/11/2019 05/20/2018 05/21/2017  Glucose 70 - 99 mg/dL 120(H) 104(H) 94  BUN 8 - 23 mg/dL 18 22 13   Creatinine 0.44 - 1.00 mg/dL 0.81 0.82 0.77  Sodium 135 - 145 mmol/L 141 140 140  Potassium 3.5 - 5.1 mmol/L 4.0 3.2(L) 3.9  Chloride 98 - 111 mmol/L 101  103 103  CO2 22 - 32 mmol/L 29 30 30(H)  Calcium 8.9 - 10.3 mg/dL 9.8 9.5 9.5  Total Protein 6.5 - 8.1 g/dL 7.7 7.0 6.8  Total Bilirubin 0.3 - 1.2 mg/dL 0.4 0.4 0.5  Alkaline Phos 38 - 126 U/L 80 75 78  AST 15 - 41 U/L 18 24 17   ALT 0 - 44 U/L 26 30 19     Lab Results  Component Value Date   WBC 7.4 06/11/2019   HGB 15.3 (H) 06/11/2019   HCT 47.3 (H) 06/11/2019   MCV 91.1 06/11/2019   PLT 273 06/11/2019   NEUTROABS 5.6 06/11/2019    ASSESSMENT & PLAN:  Primary cancer of lower outer quadrant of left female breast Left breast cancer invasive ductal carcinoma: 0.5 cm with 9 mm area of DCIS status post left mastectomy 01/04/2012 ER/PR positive HER-2 negative Ki-67 17% currently on Arimidex 1 mg daily Since 02/07/2012.  Completed 7 years of October 2020  Breast cancer surveillance: 1. Breast exam3/06/2019: No palpable areas of concern. Left mastectomy. 2.RtMammogram12/16/2020: Calcifications in the right breast, diagnostic mammogram: Benign right breast calcifications  Cecal adenocarcinoma diagnosed 04/22/2013, preop CEA of 5.1, status post right hemicolectomy 04/28/2014, grade 2, no MVI, 0/25 lymph nodes, subserosal involvement, T3 N0 M0 stage II A, no mismatch repair  Current treatment: Surveillance with CEA(1.59 in February 2021) Return to clinic in1 yrfor follow-up   No orders of the defined types were placed in this encounter.  The patient has a good understanding of the overall plan. she agrees with it. she will call with any problems that may develop before the next visit here.  Total time spent: 20 mins including face to face time and time spent for planning, charting and coordination of care  Nicholas Lose, MD 06/18/2019  I, Cloyde Reams Dorshimer, am acting as scribe for Dr. Nicholas Lose.  I have reviewed the above documentation for accuracy and completeness, and I agree with the above.

## 2019-06-19 ENCOUNTER — Telehealth: Payer: Self-pay | Admitting: Hematology and Oncology

## 2019-06-19 NOTE — Telephone Encounter (Signed)
I talk with patient regarding schedule  

## 2019-07-14 ENCOUNTER — Encounter: Payer: Self-pay | Admitting: Gastroenterology

## 2019-08-11 ENCOUNTER — Other Ambulatory Visit: Payer: Self-pay

## 2019-08-11 ENCOUNTER — Ambulatory Visit (AMBULATORY_SURGERY_CENTER): Payer: Self-pay | Admitting: *Deleted

## 2019-08-11 VITALS — Temp 97.7°F | Ht 64.5 in | Wt 214.0 lb

## 2019-08-11 DIAGNOSIS — Z86010 Personal history of colonic polyps: Secondary | ICD-10-CM

## 2019-08-11 DIAGNOSIS — Z85038 Personal history of other malignant neoplasm of large intestine: Secondary | ICD-10-CM

## 2019-08-11 MED ORDER — SUPREP BOWEL PREP KIT 17.5-3.13-1.6 GM/177ML PO SOLN: 1.0000 | Freq: Once | ORAL | 0 refills | Status: AC

## 2019-08-11 NOTE — Progress Notes (Signed)
05-29-2019 completed covid vaccines  No egg or soy allergy known to patient  No issues with past sedation with any surgeries  or procedures, no intubation problems  No diet pills per patient No home 02 use per patient  No blood thinners per patient  Pt denies issues with constipation  No A fib or A flutter  EMMI video sent to pt's e mail   Due to the COVID-19 pandemic we are asking patients to follow these guidelines. Please only bring one care partner. Please be aware that your care partner may wait in the car in the parking lot or if they feel like they will be too hot to wait in the car, they may wait in the lobby on the 4th floor. All care partners are required to wear a mask the entire time (we do not have any that we can provide them), they need to practice social distancing, and we will do a Covid check for all patient's and care partners when you arrive. Also we will check their temperature and your temperature. If the care partner waits in their car they need to stay in the parking lot the entire time and we will call them on their cell phone when the patient is ready for discharge so they can bring the car to the front of the building. Also all patient's will need to wear a mask into building.

## 2019-08-25 ENCOUNTER — Ambulatory Visit (AMBULATORY_SURGERY_CENTER): Payer: Medicare PPO | Admitting: Gastroenterology

## 2019-08-25 ENCOUNTER — Other Ambulatory Visit: Payer: Self-pay

## 2019-08-25 ENCOUNTER — Encounter: Payer: Self-pay | Admitting: Gastroenterology

## 2019-08-25 VITALS — BP 136/81 | HR 98 | Temp 96.8°F | Resp 19 | Ht 64.0 in | Wt 214.0 lb

## 2019-08-25 DIAGNOSIS — D123 Benign neoplasm of transverse colon: Secondary | ICD-10-CM

## 2019-08-25 DIAGNOSIS — D125 Benign neoplasm of sigmoid colon: Secondary | ICD-10-CM

## 2019-08-25 DIAGNOSIS — K6389 Other specified diseases of intestine: Secondary | ICD-10-CM

## 2019-08-25 DIAGNOSIS — Z85038 Personal history of other malignant neoplasm of large intestine: Secondary | ICD-10-CM

## 2019-08-25 MED ORDER — SODIUM CHLORIDE 0.9 % IV SOLN: 500.0000 mL | INTRAVENOUS | Status: DC

## 2019-08-25 NOTE — Progress Notes (Signed)
Report to PACU, RN, vss, BBS= Clear.  

## 2019-08-25 NOTE — Op Note (Signed)
[facility] Patient Name: Ann Maxwell Procedure Date: 08/25/2019 11:12 AM MRN: [id number] Endoscopist: Remo Lipps P. Havery Moros , MD Age: 79 Referring MD:  Date of Birth: June 15, 1940 Gender: Female Account #: [id number] Procedure:                Colonoscopy Indications:              High risk colon cancer surveillance: Personal                            history of colon cancer s/p resection in 2016, last                            exam in 2017, here for surveillance purposes Medicines:                Monitored Anesthesia Care Procedure:                Pre-Anesthesia Assessment:                           - Prior to the procedure, a History and Physical                            was performed, and patient medications and                            allergies were reviewed. The patient's tolerance of                            previous anesthesia was also reviewed. The risks                            and benefits of the procedure and the sedation                            options and risks were discussed with the patient.                            All questions were answered, and informed consent                            was obtained. Prior Anticoagulants: The patient has                            taken no previous anticoagulant or antiplatelet                            agents. ASA Grade Assessment: II - A patient with                            mild systemic disease. After reviewing the risks                            and benefits, the patient was deemed in  satisfactory condition to undergo the procedure.                           After obtaining informed consent, the colonoscope                            was passed under direct vision. Throughout the                            procedure, the patient's blood pressure, pulse, and                            oxygen saturations were monitored continuously. The   Colonoscope was introduced through the anus and                            advanced to the the terminal ileum. The colonoscopy                            was difficult due to severe colonic spasm which                            prolonged the exam. The patient tolerated the                            procedure well. The quality of the bowel                            preparation was good. The terminal ileum and the                            rectum, surgical anastomosis were photographed. Scope In: 11:18:56 AM Scope Out: [id number] AM Scope Withdrawal Time: 0 hours 23 minutes 10 seconds  Total Procedure Duration: 0 hours 27 minutes 13 seconds  Findings:                 The perianal and digital rectal examinations were                            normal.                           The terminal ileum appeared normal.                           There was evidence of a prior end-to-side                            ileo-colonic anastomosis in the ascending colon.                            This was patent and was characterized by ulceration                            without stigmata for bleeding. Biopsies were taken  with a cold forceps for histology.                           A diminutive polyp was found in the transverse                            colon. The polyp was sessile. The polyp was removed                            with a cold biopsy forceps. Resection and retrieval                            were complete.                           A 3 mm polyp was found in the sigmoid colon. The                            polyp was sessile. The polyp was removed with a                            cold snare. Resection and retrieval were complete.                           There was significant spasm in the entire colon                            which prolonged the exam.                           Internal hemorrhoids were found during retroflexion.                            The exam was otherwise without abnormality. Complications:            No immediate complications. Estimated blood loss:                            Minimal. Estimated Blood Loss:     Estimated blood loss was minimal. Impression:               - The examined portion of the ileum was normal.                           - Patent end-to-side ileo-colonic anastomosis,                            characterized by ulceration. Biopsied.                           - One diminutive polyp in the transverse colon,                            removed with a cold biopsy forceps. Resected and  retrieved.                           - One 3 mm polyp in the sigmoid colon, removed with                            a cold snare. Resected and retrieved.                           - Significant colonic spasm.                           - Internal hemorrhoids.                           - The examination was otherwise normal. Recommendation:           - Patient has a contact number available for                            emergencies. The signs and symptoms of potential                            delayed complications were discussed with the                            patient. Return to normal activities tomorrow.                            Written discharge instructions were provided to the                            patient.                           - Resume previous diet.                           - Continue present medications.                           - Await pathology results. Remo Lipps P. Ticara Waner, MD 08/25/2019 11:53:07 AM This report has been signed electronically.

## 2019-08-25 NOTE — Progress Notes (Signed)
Called to room to assist during endoscopic procedure.  Patient ID and intended procedure confirmed with present staff. Received instructions for my participation in the procedure from the performing physician.  

## 2019-08-25 NOTE — Patient Instructions (Signed)
Handouts on hemorrhoids and polyps given to you today  Await pathology results    YOU HAD AN ENDOSCOPIC PROCEDURE TODAY AT THE [facility]:   Refer to the procedure report that was given to you for any specific questions about what was found during the examination.  If the procedure report does not answer your questions, please call your gastroenterologist to clarify.  If you requested that your care partner not be given the details of your procedure findings, then the procedure report has been included in a sealed envelope for you to review at your convenience later.  YOU SHOULD EXPECT: Some feelings of bloating in the abdomen. Passage of more gas than usual.  Walking can help get rid of the air that was put into your GI tract during the procedure and reduce the bloating. If you had a lower endoscopy (such as a colonoscopy or flexible sigmoidoscopy) you may notice spotting of blood in your stool or on the toilet paper. If you underwent a bowel prep for your procedure, you may not have a normal bowel movement for a few days.  Please Note:  You might notice some irritation and congestion in your nose or some drainage.  This is from the oxygen used during your procedure.  There is no need for concern and it should clear up in a day or so.  SYMPTOMS TO REPORT IMMEDIATELY:   Following lower endoscopy (colonoscopy or flexible sigmoidoscopy):  Excessive amounts of blood in the stool  Significant tenderness or worsening of abdominal pains  Swelling of the abdomen that is new, acute  Fever of 100F or higher  For urgent or emergent issues, a gastroenterologist can be reached at any hour by calling [phone]. Do not use MyChart messaging for urgent concerns.    DIET:  We do recommend a small meal at first, but then you may proceed to your regular diet.  Drink plenty of fluids but you should avoid alcoholic beverages for 24 hours.  ACTIVITY:  You should plan to take it easy for the  rest of today and you should NOT DRIVE or use heavy machinery until tomorrow (because of the sedation medicines used during the test).    FOLLOW UP: Our staff will call the number listed on your records 48-72 hours following your procedure to check on you and address any questions or concerns that you may have regarding the information given to you following your procedure. If we do not reach you, we will leave a message.  We will attempt to reach you two times.  During this call, we will ask if you have developed any symptoms of COVID 19. If you develop any symptoms (ie: fever, flu-like symptoms, shortness of breath, cough etc.) before then, please call [phone].  If you test positive for Covid 19 in the 2 weeks post procedure, please call and report this information to us.    If any biopsies were taken you will be contacted by phone or by letter within the next 1-3 weeks.  Please call us at [phone] if you have not heard about the biopsies in 3 weeks.    SIGNATURES/CONFIDENTIALITY: You and/or your care partner have signed paperwork which will be entered into your electronic medical record.  These signatures attest to the fact that that the information above on your After Visit Summary has been reviewed and is understood.  Full responsibility of the confidentiality of this discharge information lies with you and/or your care-partner. 

## 2019-08-27 ENCOUNTER — Telehealth: Payer: Self-pay

## 2019-08-27 NOTE — Telephone Encounter (Signed)
  Follow up Call-  Call back number 08/25/2019  Post procedure Call Back phone  # [phone]  Permission to leave phone message Yes  Some recent data might be hidden     Patient questions:   Do you have a fever, pain , or abdominal swelling? No. Pain Score  0 *  Have you tolerated food without any problems? Yes.    Have you been able to return to your normal activities? Yes.    Do you have any questions about your discharge instructions: Diet   No. Medications  No. Follow up visit  No.  Do you have questions or concerns about your Care? No.  Actions: * If pain score is 4 or above: No action needed, pain <4.  1. Have you developed a fever since your procedure? no  2.   Have you had an respiratory symptoms (SOB or cough) since your procedure? no  3.   Have you tested positive for COVID 19 since your procedure no  4.   Have you had any family members/close contacts diagnosed with the COVID 19 since your procedure?  no   If yes to any of these questions please route to Joylene John, RN and Erenest Rasher, RN

## 2019-09-02 ENCOUNTER — Other Ambulatory Visit: Payer: Self-pay

## 2019-09-02 DIAGNOSIS — R933 Abnormal findings on diagnostic imaging of other parts of digestive tract: Secondary | ICD-10-CM

## 2020-01-14 ENCOUNTER — Ambulatory Visit: Payer: Medicare PPO | Attending: Internal Medicine

## 2020-01-14 DIAGNOSIS — Z23 Encounter for immunization: Secondary | ICD-10-CM

## 2020-01-14 NOTE — Progress Notes (Signed)
   Covid-19 Vaccination Clinic  Name:  Ann Maxwell    MRN: [id number] DOB: 03-31-1941  01/14/2020  Ann Maxwell was observed post Covid-19 immunization for 15 minutes without incident. She was provided with Vaccine Information Sheet and instruction to access the V-Safe system.   Ann Maxwell was instructed to call 911 with any severe reactions post vaccine: Marland Kitchen Difficulty breathing  . Swelling of face and throat  . A fast heartbeat  . A bad rash all over body  . Dizziness and weakness

## 2020-01-16 ENCOUNTER — Other Ambulatory Visit (HOSPITAL_COMMUNITY): Payer: Self-pay | Admitting: Internal Medicine

## 2020-03-05 ENCOUNTER — Other Ambulatory Visit: Payer: Self-pay | Admitting: Internal Medicine

## 2020-03-05 DIAGNOSIS — Z1231 Encounter for screening mammogram for malignant neoplasm of breast: Secondary | ICD-10-CM

## 2020-03-09 DIAGNOSIS — Z1212 Encounter for screening for malignant neoplasm of rectum: Secondary | ICD-10-CM | POA: Diagnosis not present

## 2020-04-22 ENCOUNTER — Ambulatory Visit
Admission: RE | Admit: 2020-04-22 | Discharge: 2020-04-22 | Disposition: A | Discharge status: Home / Self Care | Payer: Medicare PPO | Source: Ambulatory Visit | Attending: Internal Medicine | Admitting: Internal Medicine

## 2020-04-22 ENCOUNTER — Other Ambulatory Visit: Payer: Self-pay

## 2020-04-22 DIAGNOSIS — Z1231 Encounter for screening mammogram for malignant neoplasm of breast: Secondary | ICD-10-CM | POA: Diagnosis not present

## 2020-04-23 ENCOUNTER — Other Ambulatory Visit: Payer: Self-pay | Admitting: Internal Medicine

## 2020-04-23 DIAGNOSIS — R928 Other abnormal and inconclusive findings on diagnostic imaging of breast: Secondary | ICD-10-CM

## 2020-05-13 DIAGNOSIS — M25561 Pain in right knee: Secondary | ICD-10-CM | POA: Diagnosis not present

## 2020-05-17 ENCOUNTER — Ambulatory Visit
Admission: RE | Admit: 2020-05-17 | Discharge: 2020-05-17 | Disposition: A | Discharge status: Home / Self Care | Payer: Medicare PPO | Source: Ambulatory Visit | Attending: Internal Medicine | Admitting: Internal Medicine

## 2020-05-17 ENCOUNTER — Other Ambulatory Visit: Payer: Self-pay

## 2020-05-17 ENCOUNTER — Other Ambulatory Visit: Payer: Self-pay | Admitting: Internal Medicine

## 2020-05-17 DIAGNOSIS — R928 Other abnormal and inconclusive findings on diagnostic imaging of breast: Secondary | ICD-10-CM | POA: Diagnosis not present

## 2020-05-17 DIAGNOSIS — R921 Mammographic calcification found on diagnostic imaging of breast: Secondary | ICD-10-CM | POA: Diagnosis not present

## 2020-06-03 DIAGNOSIS — L570 Actinic keratosis: Secondary | ICD-10-CM | POA: Diagnosis not present

## 2020-06-03 DIAGNOSIS — L738 Other specified follicular disorders: Secondary | ICD-10-CM | POA: Diagnosis not present

## 2020-06-03 DIAGNOSIS — L821 Other seborrheic keratosis: Secondary | ICD-10-CM | POA: Diagnosis not present

## 2020-06-03 DIAGNOSIS — L820 Inflamed seborrheic keratosis: Secondary | ICD-10-CM | POA: Diagnosis not present

## 2020-06-03 DIAGNOSIS — L304 Erythema intertrigo: Secondary | ICD-10-CM | POA: Diagnosis not present

## 2020-06-21 ENCOUNTER — Other Ambulatory Visit: Payer: Self-pay

## 2020-06-21 ENCOUNTER — Inpatient Hospital Stay: Payer: Medicare PPO | Attending: Hematology and Oncology

## 2020-06-21 DIAGNOSIS — Z853 Personal history of malignant neoplasm of breast: Secondary | ICD-10-CM | POA: Diagnosis not present

## 2020-06-21 DIAGNOSIS — Z85038 Personal history of other malignant neoplasm of large intestine: Secondary | ICD-10-CM | POA: Diagnosis not present

## 2020-06-21 DIAGNOSIS — C50512 Malignant neoplasm of lower-outer quadrant of left female breast: Secondary | ICD-10-CM

## 2020-06-21 LAB — CMP (CANCER CENTER ONLY)
ALT: 21 U/L (ref 0–44)
AST: 16 U/L (ref 15–41)
Albumin: 4 g/dL (ref 3.5–5.0)
Alkaline Phosphatase: 72 U/L (ref 38–126)
Anion gap: 10 (ref 5–15)
BUN: 17 mg/dL (ref 8–23)
CO2: 28 mmol/L (ref 22–32)
Calcium: 9.4 mg/dL (ref 8.9–10.3)
Chloride: 104 mmol/L (ref 98–111)
Creatinine: 0.76 mg/dL (ref 0.44–1.00)
GFR, Estimated: >60 mL/min (ref >60)
Glucose, Bld: 94 mg/dL (ref 70–99)
Potassium: 3.6 mmol/L (ref 3.5–5.1)
Sodium: 142 mmol/L (ref 135–145)
Total Bilirubin: 0.4 mg/dL (ref 0.3–1.2)
Total Protein: 7.3 g/dL (ref 6.5–8.1)

## 2020-06-21 LAB — CBC WITH DIFFERENTIAL (CANCER CENTER ONLY)
Abs Immature Granulocytes: 0.11 10*3/uL — ABNORMAL HIGH (ref 0.00–0.07)
Basophils Absolute: 0 10*3/uL (ref 0.0–0.1)
Basophils Relative: 0 %
Eosinophils Absolute: 0.2 10*3/uL (ref 0.0–0.5)
Eosinophils Relative: 3 %
HCT: 47.5 % — ABNORMAL HIGH (ref 36.0–46.0)
Hemoglobin: 15.3 g/dL — ABNORMAL HIGH (ref 12.0–15.0)
Immature Granulocytes: 1 %
Lymphocytes Relative: 12 %
Lymphs Abs: 1.2 10*3/uL (ref 0.7–4.0)
MCH: 29.2 pg (ref 26.0–34.0)
MCHC: 32.2 g/dL (ref 30.0–36.0)
MCV: 90.6 fL (ref 80.0–100.0)
Monocytes Absolute: 0.8 10*3/uL (ref 0.1–1.0)
Monocytes Relative: 8 %
Neutro Abs: 7 10*3/uL (ref 1.7–7.7)
Neutrophils Relative %: 75 %
Platelet Count: 277 10*3/uL (ref 150–400)
RBC: 5.24 MIL/uL — ABNORMAL HIGH (ref 3.87–5.11)
RDW: 13.1 % (ref 11.5–15.5)
WBC Count: 9.3 10*3/uL (ref 4.0–10.5)
nRBC: 0 /100 WBC

## 2020-06-21 LAB — CEA (IN HOUSE-CHCC): CEA (CHCC-In House): 1.53 ng/mL (ref 0.00–5.00)

## 2020-06-23 ENCOUNTER — Ambulatory Visit: Payer: Medicare PPO | Admitting: Hematology and Oncology

## 2020-06-23 NOTE — Progress Notes (Signed)
Patient Care Team: Ann Battles, MD as PCP - General (Internal Medicine)  DIAGNOSIS:    ICD-10-CM   1. Primary colon cancer ([place])  C18.9   2. Primary cancer of lower outer quadrant of left female breast ([place])  C50.512     SUMMARY OF ONCOLOGIC HISTORY: Oncology History  Primary cancer of lower outer quadrant of left female breast ([place])  01/04/2012 Surgery   Left mastectomy: Invasive ductal carcinoma 0.5 cm with high-grade DCIS 9 cm, margins negative, 0/6 lymph nodes, ER 100%, PR 100%, Ki-67 17%, HER-2 negative ratio 1.16   02/07/2012 - 05/28/2016 Anti-estrogen oral therapy   Anastrozole 1 mg daily (stopped early due to Osteoporosis)   Primary colon cancer ([place])  03/20/2014 Initial Diagnosis   Invasive adenocarcinoma of Caecum   04/28/2014 Surgery   Right hemicolectomy: Invasive adenocarcinoma with mucinous features moderately differentiated invading subserosal tissue, margins negative, 25 lymph nodes negative, T3 N0 M0 stage II a, no microsatellite instability     CHIEF COMPLIANT: Follow-up and surveillance of breast and colon cancers  INTERVAL HISTORY: Ann Maxwell is a 80 y.o. with above-mentioned history of left breast cancer whois currently on surveillance after completion of adjuvant antiestrogen therapy with anastrozole.She also has a history of colon cancer diagnosed in 2015 and had surgery in January 2016. Mammogram on 04/22/20 showed right breast calcifications. Diagnostic mammogram on 05/17/20 showed probably benign calcifications recommended for 59-monthfollow-up. She presents to the clinic today for annual follow-up.    ALLERGIES:  is allergic to other, tape, hydrocodone, tyloxapol, and terramycin [oxytetracycline].  MEDICATIONS:  Current Outpatient Medications  Medication Sig Dispense Refill  . acetaminophen (TYLENOL) 500 MG tablet Take 1,000 mg by mouth every 6 (six) hours as needed (For pain).    .Marland Kitchenacetaminophen (TYLENOL) 500 MG tablet Tylenol Extra Strength  500 mg tablet  Take 2 tablets every 4 hours by oral route.    .Marland KitchenADVAIR DISKUS 250-50 MCG/DOSE AEPB Inhale 1 puff into the lungs 2 (two) times daily. 180 each 0  . albuterol (ACCUNEB) 0.63 MG/3ML nebulizer solution As needed    . albuterol (PROVENTIL HFA;VENTOLIN HFA) 108 (90 BASE) MCG/ACT inhaler Inhale 2 puffs into the lungs every 4 (four) hours as needed for wheezing or shortness of breath.    .Marland Kitchenalendronate (FOSAMAX) 70 MG tablet Take 1 tablet (70 mg total) by mouth once a week. Take with a full glass of water on an empty stomach.    . gabapentin (NEURONTIN) 100 MG capsule     . hydrochlorothiazide (MICROZIDE) 12.5 MG capsule     . hydrocortisone (ANUSOL-HC) 2.5 % rectal cream Place 1 application rectally 3 (three) times daily as needed for hemorrhoids or itching.     .Marland Kitchenipratropium-albuterol (DUONEB) 0.5-2.5 (3) MG/3ML SOLN Take 3 mLs by nebulization every 6 (six) hours as needed (For shortness of breath.).     .Marland Kitchenloratadine (CLARITIN) 10 MG tablet Take 10 mg by mouth daily as needed for allergies.    .Marland Kitchenlosartan (COZAAR) 100 MG tablet     . montelukast (SINGULAIR) 10 MG tablet Take 10 mg by mouth at bedtime.    . polyvinyl alcohol (LIQUIFILM TEARS) 1.4 % ophthalmic solution Place 2 drops into both eyes as needed for dry eyes.    . Vitamin D, Ergocalciferol, (DRISDOL) 1.25 MG (50000 UNIT) CAPS capsule      Current Facility-Administered Medications  Medication Dose Route Frequency Provider Last Rate Last Admin  . 0.9 %  sodium chloride infusion  500  mL Intravenous Continuous Armbruster, Carlota Raspberry, MD        PHYSICAL EXAMINATION: ECOG PERFORMANCE STATUS: 1 - Symptomatic but completely ambulatory  Vitals:   06/24/20 1502  BP: 130/71  Pulse: 91  Resp: 18  Temp: (!) 97.5 F (36.4 C)  SpO2: 98%   Filed Weights   06/24/20 1502  Weight: 205 lb 9.6 oz (93.3 kg)    BREAST: No palpable masses or nodules in either right or left breasts. No palpable axillary supraclavicular or infraclavicular  adenopathy no breast tenderness or nipple discharge. (exam performed in the presence of a chaperone)  LABORATORY DATA:  I have reviewed the data as listed CMP Latest Ref Rng & Units 06/21/2020 06/11/2019 05/20/2018  Glucose 70 - 99 mg/dL 94 120(H) 104(H)  BUN 8 - 23 mg/dL 17 18 22   Creatinine 0.44 - 1.00 mg/dL 0.76 0.81 0.82  Sodium 135 - 145 mmol/L 142 141 140  Potassium 3.5 - 5.1 mmol/L 3.6 4.0 3.2(L)  Chloride 98 - 111 mmol/L 104 101 103  CO2 22 - 32 mmol/L 28 29 30   Calcium 8.9 - 10.3 mg/dL 9.4 9.8 9.5  Total Protein 6.5 - 8.1 g/dL 7.3 7.7 7.0  Total Bilirubin 0.3 - 1.2 mg/dL 0.4 0.4 0.4  Alkaline Phos 38 - 126 U/L 72 80 75  AST 15 - 41 U/L 16 18 24   ALT 0 - 44 U/L 21 26 30     Lab Results  Component Value Date   WBC 9.3 06/21/2020   HGB 15.3 (H) 06/21/2020   HCT 47.5 (H) 06/21/2020   MCV 90.6 06/21/2020   PLT 277 06/21/2020   NEUTROABS 7.0 06/21/2020    ASSESSMENT & PLAN:  Primary cancer of lower outer quadrant of left female breast Left breast cancer invasive ductal carcinoma: 0.5 cm with 9 mm area of DCIS status post left mastectomy 01/04/2012 ER/PR positive HER-2 negative Ki-67 17% currently on Arimidex 1 mg daily Since 02/07/2012.  Completed 7 years of October 2020  Breast cancer surveillance: 1. Breast exam3/01/2021: No palpable areas of concern. Left mastectomy. 2.RtMammogram1/31/2022: Probable benign right breast calcifications follow-up mammogram in 6 months recommend  Cecal adenocarcinoma diagnosed 04/22/2013, preop CEA of 5.1, status post right hemicolectomy 04/28/2014, grade 2, no MVI, 0/25 lymph nodes, subserosal involvement, T3 N0 M0 stage II A, no mismatch repair  Current treatment: Surveillance with CEA(1.53 in 06/21/2020) Return to clinic in1 yrfor follow-up    No orders of the defined types were placed in this encounter.  The patient has a good understanding of the overall plan. she agrees with it. she will call with any problems that may  develop before the next visit here.  Total time spent: 20 mins including face to face time and time spent for planning, charting and coordination of care  Rulon Eisenmenger, MD, MPH 06/24/2020  I, Molly Dorshimer, am acting as scribe for Dr. Nicholas Lose.  I have reviewed the above documentation for accuracy and completeness, and I agree with the above.

## 2020-06-24 ENCOUNTER — Inpatient Hospital Stay: Payer: Medicare PPO | Admitting: Hematology and Oncology

## 2020-06-24 ENCOUNTER — Telehealth: Payer: Self-pay | Admitting: Hematology and Oncology

## 2020-06-24 ENCOUNTER — Other Ambulatory Visit: Payer: Self-pay

## 2020-06-24 VITALS — BP 130/71 | HR 91 | Temp 97.5°F | Resp 18 | Ht 64.0 in | Wt 205.6 lb

## 2020-06-24 DIAGNOSIS — C189 Malignant neoplasm of colon, unspecified: Secondary | ICD-10-CM | POA: Diagnosis not present

## 2020-06-24 DIAGNOSIS — C50512 Malignant neoplasm of lower-outer quadrant of left female breast: Secondary | ICD-10-CM

## 2020-06-24 DIAGNOSIS — Z85038 Personal history of other malignant neoplasm of large intestine: Secondary | ICD-10-CM | POA: Diagnosis not present

## 2020-06-24 DIAGNOSIS — Z853 Personal history of malignant neoplasm of breast: Secondary | ICD-10-CM | POA: Diagnosis not present

## 2020-06-24 NOTE — Assessment & Plan Note (Signed)
Left breast cancer invasive ductal carcinoma: 0.5 cm with 9 mm area of DCIS status post left mastectomy 01/04/2012 ER/PR positive HER-2 negative Ki-67 17% currently on Arimidex 1 mg daily Since 02/07/2012.  Completed 7 years of October 2020  Breast cancer surveillance: 1. Breast exam3/01/2021: No palpable areas of concern. Left mastectomy. 2.RtMammogram1/31/2022: Probable benign right breast calcifications follow-up mammogram in 6 months recommend  Cecal adenocarcinoma diagnosed 04/22/2013, preop CEA of 5.1, status post right hemicolectomy 04/28/2014, grade 2, no MVI, 0/25 lymph nodes, subserosal involvement, T3 N0 M0 stage II A, no mismatch repair  Current treatment: Surveillance with CEA(1.53 in 06/21/2020) Return to clinic in1 yrfor follow-up

## 2020-06-24 NOTE — Telephone Encounter (Signed)
Scheduled appts per 3/10 los. Gave pt a print out of AVS.  

## 2020-07-27 ENCOUNTER — Ambulatory Visit: Payer: Medicare PPO | Attending: Internal Medicine

## 2020-07-27 ENCOUNTER — Other Ambulatory Visit: Payer: Self-pay

## 2020-07-27 ENCOUNTER — Other Ambulatory Visit (HOSPITAL_BASED_OUTPATIENT_CLINIC_OR_DEPARTMENT_OTHER): Payer: Self-pay

## 2020-07-27 DIAGNOSIS — Z23 Encounter for immunization: Secondary | ICD-10-CM

## 2020-07-27 NOTE — Progress Notes (Signed)
   Covid-19 Vaccination Clinic  Name:  Ann Maxwell    MRN: [id number] DOB: [birth year]  07/27/2020  [name] was observed post Covid-19 immunization for 15 minutes without incident. She was provided with Vaccine Information Sheet and instruction to access the V-Safe system.   [name] was instructed to call 911 with any severe reactions post vaccine: Marland Kitchen Difficulty breathing  . Swelling of face and throat  . A fast heartbeat  . A bad rash all over body  . Dizziness and weakness   Immunizations Administered    Name Date Dose VIS Date Route   PFIZER Comrnaty(Gray TOP) Covid-19 Vaccine 07/27/2020 12:22 PM 0.3 mL 03/25/2020 Intramuscular   Manufacturer: [place]   Lot: [id number]   NDC: [phone]

## 2020-07-30 ENCOUNTER — Other Ambulatory Visit (HOSPITAL_BASED_OUTPATIENT_CLINIC_OR_DEPARTMENT_OTHER): Payer: Self-pay

## 2020-07-30 MED ORDER — COVID-19 MRNA VACCINE (PFIZER) 30 MCG/0.3ML IM SUSP
INTRAMUSCULAR | 0 refills | Status: DC
  Filled 2020-07-30: qty 0.3, 1d supply, fill #0

## 2020-10-25 ENCOUNTER — Other Ambulatory Visit (HOSPITAL_COMMUNITY): Payer: Self-pay

## 2020-11-09 DIAGNOSIS — M25561 Pain in right knee: Secondary | ICD-10-CM | POA: Diagnosis not present

## 2020-11-15 ENCOUNTER — Ambulatory Visit
Admission: RE | Admit: 2020-11-15 | Discharge: 2020-11-15 | Disposition: A | Discharge status: Home / Self Care | Payer: Medicare PPO | Source: Ambulatory Visit | Attending: Internal Medicine | Admitting: Internal Medicine

## 2020-11-15 ENCOUNTER — Other Ambulatory Visit: Payer: Self-pay

## 2020-11-15 ENCOUNTER — Other Ambulatory Visit: Payer: Self-pay | Admitting: Internal Medicine

## 2020-11-15 DIAGNOSIS — R921 Mammographic calcification found on diagnostic imaging of breast: Secondary | ICD-10-CM

## 2020-11-15 DIAGNOSIS — R922 Inconclusive mammogram: Secondary | ICD-10-CM | POA: Diagnosis not present

## 2020-12-14 DIAGNOSIS — Z961 Presence of intraocular lens: Secondary | ICD-10-CM | POA: Diagnosis not present

## 2020-12-14 DIAGNOSIS — H52203 Unspecified astigmatism, bilateral: Secondary | ICD-10-CM | POA: Diagnosis not present

## 2021-02-03 DIAGNOSIS — M1711 Unilateral primary osteoarthritis, right knee: Secondary | ICD-10-CM | POA: Diagnosis not present

## 2021-02-22 DIAGNOSIS — M810 Age-related osteoporosis without current pathological fracture: Secondary | ICD-10-CM | POA: Diagnosis not present

## 2021-02-22 DIAGNOSIS — I10 Essential (primary) hypertension: Secondary | ICD-10-CM | POA: Diagnosis not present

## 2021-02-22 DIAGNOSIS — E785 Hyperlipidemia, unspecified: Secondary | ICD-10-CM | POA: Diagnosis not present

## 2021-03-01 DIAGNOSIS — R82998 Other abnormal findings in urine: Secondary | ICD-10-CM | POA: Diagnosis not present

## 2021-03-01 DIAGNOSIS — J45909 Unspecified asthma, uncomplicated: Secondary | ICD-10-CM | POA: Diagnosis not present

## 2021-03-01 DIAGNOSIS — Z0000 Encounter for general adult medical examination without abnormal findings: Secondary | ICD-10-CM | POA: Diagnosis not present

## 2021-03-01 DIAGNOSIS — M1990 Unspecified osteoarthritis, unspecified site: Secondary | ICD-10-CM | POA: Diagnosis not present

## 2021-03-01 DIAGNOSIS — R040 Epistaxis: Secondary | ICD-10-CM | POA: Diagnosis not present

## 2021-03-01 DIAGNOSIS — I700 Atherosclerosis of aorta: Secondary | ICD-10-CM | POA: Diagnosis not present

## 2021-03-01 DIAGNOSIS — Z1212 Encounter for screening for malignant neoplasm of rectum: Secondary | ICD-10-CM | POA: Diagnosis not present

## 2021-03-01 DIAGNOSIS — E785 Hyperlipidemia, unspecified: Secondary | ICD-10-CM | POA: Diagnosis not present

## 2021-03-01 DIAGNOSIS — M810 Age-related osteoporosis without current pathological fracture: Secondary | ICD-10-CM | POA: Diagnosis not present

## 2021-03-01 DIAGNOSIS — Z853 Personal history of malignant neoplasm of breast: Secondary | ICD-10-CM | POA: Diagnosis not present

## 2021-03-01 DIAGNOSIS — I10 Essential (primary) hypertension: Secondary | ICD-10-CM | POA: Diagnosis not present

## 2021-04-17 DIAGNOSIS — C50919 Malignant neoplasm of unspecified site of unspecified female breast: Secondary | ICD-10-CM

## 2021-04-17 HISTORY — DX: Malignant neoplasm of unspecified site of unspecified female breast: C50.919

## 2021-05-09 NOTE — Progress Notes (Signed)
COVID TEST ON 05/19/21 AT 0800AM. Come thru main entrance at [facility] long.  Have a seat in the lobby on the right as you come thru the door.  Call [phone] and let them know you are here for covid testing.  .                 Your procedure is scheduled on:              05/23/21   Report to [facility] Main  Entrance   Report to admitting at    [phone]     Call this number if you have problems the morning of surgery [phone]    REMEMBER: NO  SOLID FOOD CANDY OR GUM AFTER MIDNIGHT. CLEAR LIQUIDS UNTIL  0615 am         . NOTHING BY MOUTH EXCEPT CLEAR LIQUIDS UNTIL   0615am  . PLEASE FINISH ENSURE DRINK PER SURGEON ORDER  WHICH NEEDS TO BE COMPLETED AT    0615am   .      CLEAR LIQUID DIET   Foods Allowed                                                                    Coffee and tea, regular and decaf                            Fruit ices (not with fruit pulp)                                      Iced Popsicles                                    Carbonated beverages, regular and diet                                    Cranberry, grape and apple juices Sports drinks like Gatorade Lightly seasoned clear broth or consume(fat free) Sugar, honey syrup ___________________________________________________________________      BRUSH YOUR TEETH MORNING OF SURGERY AND RINSE YOUR MOUTH OUT, NO CHEWING GUM CANDY OR MINTS.     Take these medicines the morning of surgery with A SIP OF WATER:  inhalers as usual and bring   DO NOT TAKE ANY DIABETIC MEDICATIONS DAY OF YOUR SURGERY                               You may not have any metal on your body including hair pins and              piercings  Do not wear jewelry, make-up, lotions, powders or perfumes, deodorant             Do not wear nail polish on your fingernails.  Do not shave  48 hours prior to surgery.              Men may shave face and  neck.   Do not bring valuables to the hospital. [place].  Contacts, dentures or bridgework may not be worn into surgery.  Leave suitcase in the car. After surgery it may be brought to your room.     Patients discharged the day of surgery will not be allowed to drive home. IF YOU ARE HAVING SURGERY AND GOING HOME THE SAME DAY, YOU MUST HAVE AN ADULT TO DRIVE YOU HOME AND BE WITH YOU FOR 24 HOURS. YOU MAY GO HOME BY TAXI OR UBER OR ORTHERWISE, BUT AN ADULT MUST ACCOMPANY YOU HOME AND STAY WITH YOU FOR 24 HOURS.  Name and phone number of your driver:  Special Instructions: N/A              Please read over the following fact sheets you were given: _____________________________________________________________________  [facility] - Preparing for Surgery Before surgery, you can play an important role.  Because skin is not sterile, your skin needs to be as free of germs as possible.  You can reduce the number of germs on your skin by washing with CHG (chlorahexidine gluconate) soap before surgery.  CHG is an antiseptic cleaner which kills germs and bonds with the skin to continue killing germs even after washing. Please DO NOT use if you have an allergy to CHG or antibacterial soaps.  If your skin becomes reddened/irritated stop using the CHG and inform your nurse when you arrive at Short Stay. Do not shave (including legs and underarms) for at least 48 hours prior to the first CHG shower.  You may shave your face/neck. Please follow these instructions carefully:  1.  Shower with CHG Soap the night before surgery and the  morning of Surgery.  2.  If you choose to wash your hair, wash your hair first as usual with your  normal  shampoo.  3.  After you shampoo, rinse your hair and body thoroughly to remove the  shampoo.                           4.  Use CHG as you would any other liquid soap.  You can apply chg directly  to the skin and wash                       Gently with a scrungie or clean washcloth.  5.  Apply the CHG Soap  to your body ONLY FROM THE NECK DOWN.   Do not use on face/ open                           Wound or open sores. Avoid contact with eyes, ears mouth and genitals (private parts).                       Wash face,  Genitals (private parts) with your normal soap.             6.  Wash thoroughly, paying special attention to the area where your surgery  will be performed.  7.  Thoroughly rinse your body with warm water from the neck down.  8.  DO NOT shower/wash with your normal soap after using and rinsing off  the CHG Soap.  9.  Pat yourself dry with a clean towel.            10.  Wear clean pajamas.            11.  Place clean sheets on your bed the night of your first shower and do not  sleep with pets. Day of Surgery : Do not apply any lotions/deodorants the morning of surgery.  Please wear clean clothes to the hospital/surgery center.  FAILURE TO FOLLOW THESE INSTRUCTIONS MAY RESULT IN THE CANCELLATION OF YOUR SURGERY PATIENT SIGNATURE_________________________________  NURSE SIGNATURE__________________________________  ________________________________________________________________________

## 2021-05-09 NOTE — Progress Notes (Addendum)
Anesthesia Review:  PCP: Dr Leanna Battles  Called and [facility]MM for Claiborne Billings at [place] for clearance .  Clearance dated 05/13/21 on chart.  Cardiologist : none  Chest x-ray : EKG : 05/11/21 Echo : Stress test: Cardiac Cath :  Activity level: can do a flight of stairs without difficulty  Sleep Study/ CPAP : none  Fasting Blood Sugar :      / Checks Blood Sugar -- times a day:   Blood Thinner/ Instructions /Last Dose: ASA / Instructions/ Last Dose :   Covid test on 05/19/21 at 0800am

## 2021-05-11 ENCOUNTER — Other Ambulatory Visit: Payer: Self-pay

## 2021-05-11 ENCOUNTER — Encounter (HOSPITAL_COMMUNITY)
Admission: RE | Admit: 2021-05-11 | Discharge: 2021-05-11 | Disposition: A | Discharge status: Home / Self Care | Payer: Medicare PPO | Source: Ambulatory Visit | Attending: Orthopedic Surgery | Admitting: Orthopedic Surgery

## 2021-05-11 ENCOUNTER — Encounter (HOSPITAL_COMMUNITY): Payer: Self-pay

## 2021-05-11 VITALS — BP 154/96 | HR 93 | Temp 98.8°F | Resp 16 | Ht 64.0 in | Wt 200.0 lb

## 2021-05-11 DIAGNOSIS — Z01818 Encounter for other preprocedural examination: Secondary | ICD-10-CM | POA: Diagnosis not present

## 2021-05-11 DIAGNOSIS — M1711 Unilateral primary osteoarthritis, right knee: Secondary | ICD-10-CM | POA: Insufficient documentation

## 2021-05-11 LAB — COMPREHENSIVE METABOLIC PANEL
ALT: 18 U/L (ref 0–44)
AST: 16 U/L (ref 15–41)
Albumin: 4 g/dL (ref 3.5–5.0)
Alkaline Phosphatase: 65 U/L (ref 38–126)
Anion gap: 9 (ref 5–15)
BUN: 14 mg/dL (ref 8–23)
CO2: 26 mmol/L (ref 22–32)
Calcium: 9.5 mg/dL (ref 8.9–10.3)
Chloride: 101 mmol/L (ref 98–111)
Creatinine, Ser: 0.66 mg/dL (ref 0.44–1.00)
GFR, Estimated: >60 mL/min (ref >60)
Glucose, Bld: 95 mg/dL (ref 70–99)
Potassium: 3.7 mmol/L (ref 3.5–5.1)
Sodium: 136 mmol/L (ref 135–145)
Total Bilirubin: 0.6 mg/dL (ref 0.3–1.2)
Total Protein: 7.2 g/dL (ref 6.5–8.1)

## 2021-05-11 LAB — CBC
HCT: 46.8 % — ABNORMAL HIGH (ref 36.0–46.0)
Hemoglobin: 15.4 g/dL — ABNORMAL HIGH (ref 12.0–15.0)
MCH: 29.8 pg (ref 26.0–34.0)
MCHC: 32.9 g/dL (ref 30.0–36.0)
MCV: 90.7 fL (ref 80.0–100.0)
Platelets: 271 10*3/uL (ref 150–400)
RBC: 5.16 MIL/uL — ABNORMAL HIGH (ref 3.87–5.11)
RDW: 13.1 % (ref 11.5–15.5)
WBC: 8 10*3/uL (ref 4.0–10.5)
nRBC: 0 % (ref 0.0–0.2)

## 2021-05-11 LAB — PROTIME-INR
INR: 0.9 (ref 0.8–1.2)
Prothrombin Time: 12.6 seconds (ref 11.4–15.2)

## 2021-05-11 LAB — SURGICAL PCR SCREEN
MRSA, PCR: NEGATIVE
Staphylococcus aureus: NEGATIVE

## 2021-05-12 ENCOUNTER — Encounter (HOSPITAL_COMMUNITY): Payer: Self-pay | Admitting: Physician Assistant

## 2021-05-12 NOTE — H&P (Signed)
TOTAL KNEE ADMISSION H&P  Patient is being admitted for right total knee arthroplasty.  Subjective:  Chief Complaint: Right knee pain.  HPI: Ann Maxwell, 81 y.o. female has a history of pain and functional disability in the right knee due to arthritis and has failed non-surgical conservative treatments for greater than 12 weeks to include NSAID's and/or analgesics, flexibility and strengthening excercises, and activity modification. Onset of symptoms was gradual, starting  several  years ago with gradually worsening course since that time. The patient noted no past surgery on the right knee.  Patient currently rates pain in the right knee at 6 out of 10 with activity. Patient has worsening of pain with activity and weight bearing, pain that interferes with activities of daily living, pain with passive range of motion, and crepitus. Patient has evidence of periarticular osteophytes and joint space narrowing by imaging studies. There is no active infection.  Patient Active Problem List   Diagnosis Date Noted   Pain in right knee 06/12/2019   Asthma 10/17/2017   OA (osteoarthritis) of knee 08/23/2015   Primary colon cancer ([place]) 03/31/2014   Iron deficiency anemia due to chronic blood loss 07/01/2013   Sinus tachycardia 09/12/2012   Mobitz type 1 second degree AV block 09/12/2012   Chest pain 09/11/2012   HTN (hypertension) 09/11/2012   Asthma, chronic 09/11/2012   Primary cancer of lower outer quadrant of left female breast ([place]) 12/12/2011    Past Medical History:  Diagnosis Date   Allergy    Anemia    iron infusion -last done 1 month ago(12-15, and 12-22 -15 [facility])- Dr. Lindi Adie   Anxiety    Asthma    Breast CA ([place]) 2013   a. L breast DCIS, s/p L mastectomy 12/2011. no further issues   Cataract    bilat removed    Chronic kidney disease    kidney stones    Colon cancer ([place])    Depression    pt denies any hx of depression    Environmental allergies    History of kidney  stones    Hypertension    controlled on meds    Internal hemorrhoids    remains an issue- no bright bleeding seen recent   Iron deficiency anemia    Obesity    Osteoarthritis    arthritis- knees and shoulders   Osteoporosis    drug induced and on Fosamax   Transfusion history    2 yrs ago due to anemia   Vertigo    no recent issues    Past Surgical History:  Procedure Laterality Date   BREAST SURGERY     CATARACT EXTRACTION, BILATERAL  2019   CHOLECYSTECTOMY     COLON RESECTION N/A 04/28/2014   Procedure: LAPAROSCOPIC  RIGHT HEMI-COLECTOMY;  Surgeon: Stark Klein, MD;  Location: [facility];  Service: General;  Laterality: N/A;   COLONOSCOPY     Lithectomy  2010   LITHOTRIPSY     MASTECTOMY Left 2013   MASTECTOMY W/ SENTINEL NODE BIOPSY  01/04/2012   Procedure: MASTECTOMY WITH SENTINEL LYMPH NODE BIOPSY;  Surgeon: Stark Klein, MD;  Location: [facility];  Service: General;  Laterality: Left;   ORIF TIBIA & FIBULA FRACTURES     wtih subsequent pin removal in 1997   ORIF ULNAR / RADIAL SHAFT FRACTURE Left    POLYPECTOMY     TONSILLECTOMY AND ADENOIDECTOMY     TOTAL KNEE ARTHROPLASTY Left 08/23/2015   Procedure: TOTAL KNEE ARTHROPLASTY;  Surgeon: Gaynelle Arabian,  MD;  Location: [facility];  Service: Orthopedics;  Laterality: Left;   VAGINAL HYSTERECTOMY  20 yrs. ago   with BSO    Prior to Admission medications   Medication Sig Start Date End Date Taking? Authorizing Provider  ADVAIR DISKUS 250-50 MCG/DOSE AEPB Inhale 1 puff into the lungs 2 (two) times daily. 07/23/14  Yes Nicholas Lose, MD  albuterol (VENTOLIN HFA) 108 (90 Base) MCG/ACT inhaler Inhale 2 puffs into the lungs every 6 (six) hours as needed for wheezing or shortness of breath.   Yes [provider]  alendronate (FOSAMAX) 70 MG tablet Take 1 tablet (70 mg total) by mouth once a week. Take with a full glass of water on an empty stomach. 05/28/17  Yes Nicholas Lose, MD  hydrochlorothiazide (MICROZIDE) 12.5 MG capsule Take 12.5  mg by mouth daily. 08/07/19  Yes [provider]  hydrocortisone (ANUSOL-HC) 2.5 % rectal cream Place 1 application rectally 3 (three) times daily as needed for hemorrhoids or itching.    Yes [provider]  loratadine (CLARITIN) 10 MG tablet Take 10 mg by mouth daily as needed for allergies.   Yes [provider]  losartan (COZAAR) 50 MG tablet Take 50 mg by mouth every evening. 08/07/19  Yes [provider]  melatonin 5 MG TABS Take 5 mg by mouth at bedtime as needed (sleep).   Yes [provider]  montelukast (SINGULAIR) 10 MG tablet Take 10 mg by mouth at bedtime.   Yes [provider]  polyvinyl alcohol (LIQUIFILM TEARS) 1.4 % ophthalmic solution Place 2 drops into both eyes as needed for dry eyes.   Yes [provider]  Vitamin D, Ergocalciferol, (DRISDOL) 1.25 MG (50000 UNIT) CAPS capsule Take 50,000 Units by mouth every 7 (seven) days. 08/07/19  Yes [provider]  COVID-19 mRNA vaccine, Pfizer, 30 MCG/0.3ML injection Inject into the muscle. Patient not taking: Reported on 05/04/2021 07/27/20   Carlyle Basques, MD    Allergies  Allergen Reactions   Other Shortness Of Breath and Other (See Comments)    Tree and shrub pollen    Tape Other (See Comments) and Rash    Surgical tape causes blisters   Hydrocodone Nausea Only   Tyloxapol     Tylox    Terramycin [Oxytetracycline] Rash    Social History   Socioeconomic History   Marital status: Divorced    Spouse name: Not on file   Number of children: Not on file   Years of education: Not on file   Highest education level: Not on file  Occupational History   Occupation: retired Marine scientist  Tobacco Use   Smoking status: Never   Smokeless tobacco: Never  Vaping Use   Vaping Use: Never used  Substance and Sexual Activity   Alcohol use: Yes    Alcohol/week: 0.0 standard drinks    Comment: occasionally - few times a year    Drug use: No   Sexual activity: Never   Other Topics Concern   Not on file  Social History Narrative   Retired Sports coach, 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 who is a Marine scientist   Social Determinants of Radio broadcast assistant Strain: Not on file  Food Insecurity: Not on file  Transportation Needs: Not on file  Physical Activity: Not on file  Stress: Not on file  Social Connections: Not on file  Intimate Partner Violence: Not on file    Tobacco Use: Low Risk    Smoking Tobacco Use: Never   Smokeless  Tobacco Use: Never   Passive Exposure: Not on file   Social History   Substance and Sexual Activity  Alcohol Use Yes   Alcohol/week: 0.0 standard drinks   Comment: occasionally - few times a year     Family History  Problem Relation Age of Onset   Congestive Heart Failure Father        Died at 41   Hypertension Father    Heart disease Father    Stroke Father    Ovarian cancer Mother 50       deceased 54   Uterine cancer Maternal Grandmother        late 58s   Liver cancer Paternal Grandfather 24       deceased 55s   Thyroid cancer Sister 70       currently 37   Colon cancer Neg Hx    Stomach cancer Neg Hx    Esophageal cancer Neg Hx    Rectal cancer Neg Hx    Breast cancer Neg Hx    Colon polyps Neg Hx     ROS: Constitutional: no fever, no chills, no night sweats, no significant weight loss Cardiovascular: no chest pain, no palpitations Respiratory: no cough, no shortness of breath, No COPD Gastrointestinal: no vomiting, no nausea Musculoskeletal: no swelling in Joints, Joint Pain Neurologic: no numbness, no tingling, no difficulty with balance   Objective:  Physical Exam: Well nourished and well developed.  General: Alert and oriented x3, cooperative and pleasant, no acute distress.  Head: normocephalic, atraumatic, neck supple.  Eyes: EOMI.  Respiratory: breath sounds clear in all fields, no wheezing, rales, or rhonchi. Cardiovascular: Regular rate and rhythm, no murmurs, gallops or rubs.  Abdomen:  non-tender to palpation and soft, normoactive bowel sounds. Musculoskeletal:  Significantly antalgic gait pattern favoring the right side.    Right Hip Exam:  ROM: Normal without discomfort.    Right Knee Exam:  No effusion.  Range of motion is 0-115 or 120 degrees.  Moderate crepitus on range of motion of the knee.  Positive medial joint line tenderness.  Slight lateral joint line tenderness.  Stable knee.    Left Knee Exam:  No effusion.  Range of motion is 0-125 degrees.  No crepitus on range of motion of the knee.  No medial joint line tenderness.  No lateral joint line tenderness.  Stable knee.    The patient's sensation and motor function are intact in their lower extremities. Their distal pulses are 2+. The bilateral calves are soft and non-tender.        Vital signs in last 24 hours:    Imaging Review Radiographs- AP and lateral of the right knee demonstrate bone-on-bone arthritis in the medial and patellofemoral compartments.   Assessment/Plan:  End stage arthritis, right knee   The patient history, physical examination, clinical judgment of the provider and imaging studies are consistent with end stage degenerative joint disease of the right knee and total knee arthroplasty is deemed medically necessary. The treatment options including medical management, injection therapy arthroscopy and arthroplasty were discussed at length. The risks and benefits of total knee arthroplasty were presented and reviewed. The risks due to aseptic loosening, infection, stiffness, patella tracking problems, thromboembolic complications and other imponderables were discussed. The patient acknowledged the explanation, agreed to proceed with the plan and consent was signed. Patient is being admitted for inpatient treatment for surgery, pain control, PT, OT, prophylactic antibiotics, VTE prophylaxis, progressive ambulation and ADLs and discharge planning. The patient is planning  to be  discharged  home .   Patient's anticipated LOS is less than 2 midnights, meeting these requirements: - Younger than 16 - Lives within 1 hour of care - Has a competent adult at home to recover with post-op recover - NO history of  - Chronic pain requiring opiods  - Diabetes  - Coronary Artery Disease  - Heart failure  - Heart attack  - Stroke  - DVT/VTE  - Cardiac arrhythmia  - Respiratory Failure/COPD  - Renal failure  - Anemia  - Advanced Liver disease    Therapy Plans: [facility] Disposition: Home with Friend Planned DVT Prophylaxis: Xarelto 10mg  (Hx of Breast CA; Hx of Colon CA) DME Needed: None PCP: Leanna Battles, MD (patient seein for clearance - contacting for form) TXA: IV Allergies: Adhesive tape (can use paper tape) Anesthesia Concerns: None BMI: 35.6 Last HgbA1c: n/a  Pharmacy: [facility] on [address]  Other: - Doesn't need Gabapentin prescription - already has it at home  - Patient was instructed on what medications to stop prior to surgery. - Follow-up visit in 2 weeks with Dr. Wynelle Link - Begin physical therapy following surgery - Pre-operative lab work as pre-surgical testing - Prescriptions will be provided in hospital at time of discharge  Fenton Foy, [facility], PA-C Orthopedic Surgery [facility] [place]

## 2021-05-17 DIAGNOSIS — H903 Sensorineural hearing loss, bilateral: Secondary | ICD-10-CM | POA: Diagnosis not present

## 2021-05-19 ENCOUNTER — Ambulatory Visit
Admission: RE | Admit: 2021-05-19 | Discharge: 2021-05-19 | Disposition: A | Discharge status: Home / Self Care | Payer: Medicare PPO | Source: Ambulatory Visit | Attending: Internal Medicine | Admitting: Internal Medicine

## 2021-05-19 ENCOUNTER — Other Ambulatory Visit: Payer: Self-pay

## 2021-05-19 ENCOUNTER — Other Ambulatory Visit: Payer: Self-pay | Admitting: Internal Medicine

## 2021-05-19 ENCOUNTER — Encounter (HOSPITAL_COMMUNITY)
Admission: RE | Admit: 2021-05-19 | Discharge: 2021-05-19 | Disposition: A | Discharge status: Home / Self Care | Payer: Medicare PPO | Source: Ambulatory Visit | Attending: Orthopedic Surgery | Admitting: Orthopedic Surgery

## 2021-05-19 DIAGNOSIS — Z20822 Contact with and (suspected) exposure to covid-19: Secondary | ICD-10-CM | POA: Diagnosis not present

## 2021-05-19 DIAGNOSIS — N6312 Unspecified lump in the right breast, upper inner quadrant: Secondary | ICD-10-CM

## 2021-05-19 DIAGNOSIS — Z01812 Encounter for preprocedural laboratory examination: Secondary | ICD-10-CM | POA: Diagnosis not present

## 2021-05-19 DIAGNOSIS — N6310 Unspecified lump in the right breast, unspecified quadrant: Secondary | ICD-10-CM

## 2021-05-19 DIAGNOSIS — R921 Mammographic calcification found on diagnostic imaging of breast: Secondary | ICD-10-CM

## 2021-05-19 DIAGNOSIS — Z01818 Encounter for other preprocedural examination: Secondary | ICD-10-CM

## 2021-05-19 DIAGNOSIS — N6315 Unspecified lump in the right breast, overlapping quadrants: Secondary | ICD-10-CM | POA: Diagnosis not present

## 2021-05-19 LAB — SARS CORONAVIRUS 2 (TAT 6-24 HRS): SARS Coronavirus 2: NEGATIVE

## 2021-05-23 DIAGNOSIS — M1711 Unilateral primary osteoarthritis, right knee: Secondary | ICD-10-CM

## 2021-05-27 ENCOUNTER — Ambulatory Visit
Admission: RE | Admit: 2021-05-27 | Discharge: 2021-05-27 | Disposition: A | Discharge status: Home / Self Care | Payer: Medicare PPO | Source: Ambulatory Visit | Attending: Internal Medicine | Admitting: Internal Medicine

## 2021-05-27 ENCOUNTER — Other Ambulatory Visit: Payer: Self-pay

## 2021-05-27 DIAGNOSIS — N6315 Unspecified lump in the right breast, overlapping quadrants: Secondary | ICD-10-CM | POA: Diagnosis not present

## 2021-05-27 DIAGNOSIS — N6310 Unspecified lump in the right breast, unspecified quadrant: Secondary | ICD-10-CM

## 2021-05-27 DIAGNOSIS — C50811 Malignant neoplasm of overlapping sites of right female breast: Secondary | ICD-10-CM | POA: Diagnosis not present

## 2021-05-27 DIAGNOSIS — Z171 Estrogen receptor negative status [ER-]: Secondary | ICD-10-CM | POA: Diagnosis not present

## 2021-05-27 HISTORY — PX: BREAST BIOPSY: SHX20

## 2021-05-30 ENCOUNTER — Telehealth: Payer: Self-pay | Admitting: Hematology and Oncology

## 2021-05-30 ENCOUNTER — Encounter: Payer: Self-pay | Admitting: *Deleted

## 2021-05-30 NOTE — Telephone Encounter (Signed)
Sch per 2/13 inbasket, pt aware

## 2021-05-31 NOTE — Progress Notes (Signed)
Patient Care Team: Donnajean Lopes, MD as PCP - General (Internal Medicine) Mauro Kaufmann, RN as Oncology Nurse Navigator Rockwell Germany, RN as Oncology Nurse Navigator  DIAGNOSIS:    ICD-10-CM   1. Primary cancer of lower outer quadrant of left female breast ([place])  C50.512       SUMMARY OF ONCOLOGIC HISTORY: Oncology History  Primary cancer of lower outer quadrant of left female breast ([place])  01/04/2012 Surgery   Left mastectomy: Invasive ductal carcinoma 0.5 cm with high-grade DCIS 9 cm, margins negative, 0/6 lymph nodes, ER 100%, PR 100%, Ki-67 17%, HER-2 negative ratio 1.16   02/07/2012 - 05/28/2016 Anti-estrogen oral therapy   Anastrozole 1 mg daily (stopped early due to Osteoporosis)   Primary colon cancer ([place])  03/20/2014 Initial Diagnosis   Invasive adenocarcinoma of [place]   04/28/2014 Surgery   Right hemicolectomy: Invasive adenocarcinoma with mucinous features moderately differentiated invading subserosal tissue, margins negative, 25 lymph nodes negative, T3 N0 M0 stage II a, no microsatellite instability     CHIEF COMPLIANT: Follow-up and surveillance of breast and colon cancers  INTERVAL HISTORY: Ann Maxwell is a 81 y.o. with above-mentioned history of left breast cancer who is currently on surveillance after completion of adjuvant antiestrogen therapy with anastrozole. She also has a history of colon cancer diagnosed in 2015 and had surgery in January 2016. She presents to the clinic today for follow-up.  Recent mammogram and ultrasound revealed a new breast cancer in the right breast which on biopsy came back as triple negative disease.  She is here today accompanied by her daughter to discuss her treatment plan.  ALLERGIES:  is allergic to other, tape, hydrocodone, tyloxapol, and terramycin [oxytetracycline].  MEDICATIONS:  Current Outpatient Medications  Medication Sig Dispense Refill   ADVAIR DISKUS 250-50 MCG/DOSE AEPB Inhale 1 puff into the lungs  2 (two) times daily. 180 each 0   albuterol (VENTOLIN HFA) 108 (90 Base) MCG/ACT inhaler Inhale 2 puffs into the lungs every 6 (six) hours as needed for wheezing or shortness of breath.     alendronate (FOSAMAX) 70 MG tablet Take 1 tablet (70 mg total) by mouth once a week. Take with a full glass of water on an empty stomach.     COVID-19 mRNA vaccine, Pfizer, 30 MCG/0.3ML injection Inject into the muscle. (Patient not taking: Reported on 05/04/2021) 0.3 mL 0   hydrochlorothiazide (MICROZIDE) 12.5 MG capsule Take 12.5 mg by mouth daily.     hydrocortisone (ANUSOL-HC) 2.5 % rectal cream Place 1 application rectally 3 (three) times daily as needed for hemorrhoids or itching.      loratadine (CLARITIN) 10 MG tablet Take 10 mg by mouth daily as needed for allergies.     losartan (COZAAR) 50 MG tablet Take 50 mg by mouth every evening.     melatonin 5 MG TABS Take 5 mg by mouth at bedtime as needed (sleep).     montelukast (SINGULAIR) 10 MG tablet Take 10 mg by mouth at bedtime.     polyvinyl alcohol (LIQUIFILM TEARS) 1.4 % ophthalmic solution Place 2 drops into both eyes as needed for dry eyes.     Vitamin D, Ergocalciferol, (DRISDOL) 1.25 MG (50000 UNIT) CAPS capsule Take 50,000 Units by mouth every 7 (seven) days.     Current Facility-Administered Medications  Medication Dose Route Frequency Provider Last Rate Last Admin   0.9 %  sodium chloride infusion  500 mL Intravenous Continuous Armbruster, Carlota Raspberry, MD  PHYSICAL EXAMINATION: ECOG PERFORMANCE STATUS: 1 - Symptomatic but completely ambulatory  Vitals:   06/01/21 0903  BP: (!) 151/89  Pulse: 80  Resp: 18  Temp: 97.7 F (36.5 C)  SpO2: 97%   Filed Weights   06/01/21 0903  Weight: 204 lb 11.2 oz (92.9 kg)    BREAST: No palpable masses or nodules in either right or left breasts. No palpable axillary supraclavicular or infraclavicular adenopathy no breast tenderness or nipple discharge. (exam performed in the presence of a  chaperone)  LABORATORY DATA:  I have reviewed the data as listed CMP Latest Ref Rng & Units 05/11/2021 06/21/2020 06/11/2019  Glucose 70 - 99 mg/dL 95 94 120(H)  BUN 8 - 23 mg/dL 14 17 18   Creatinine 0.44 - 1.00 mg/dL 0.66 0.76 0.81  Sodium 135 - 145 mmol/L 136 142 141  Potassium 3.5 - 5.1 mmol/L 3.7 3.6 4.0  Chloride 98 - 111 mmol/L 101 104 101  CO2 22 - 32 mmol/L 26 28 29   Calcium 8.9 - 10.3 mg/dL 9.5 9.4 9.8  Total Protein 6.5 - 8.1 g/dL 7.2 7.3 7.7  Total Bilirubin 0.3 - 1.2 mg/dL 0.6 0.4 0.4  Alkaline Phos 38 - 126 U/L 65 72 80  AST 15 - 41 U/L 16 16 18   ALT 0 - 44 U/L 18 21 26     Lab Results  Component Value Date   WBC 8.0 05/11/2021   HGB 15.4 (H) 05/11/2021   HCT 46.8 (H) 05/11/2021   MCV 90.7 05/11/2021   PLT 271 05/11/2021   NEUTROABS 7.0 06/21/2020    ASSESSMENT & PLAN:  Primary cancer of lower outer quadrant of left female breast ([place]) Left breast cancer invasive ductal carcinoma: 0.5 cm with 9 mm area of DCIS status post left mastectomy 01/04/2012 ER/PR positive HER-2 negative Ki-67 17% currently on Arimidex 1 mg daily  Since 02/07/2012.  Completed 7 years of October 2020  (cecal adenocarcinoma diagnosed 04/22/2013, preop CEA of 5.1, status post right hemicolectomy 04/28/2014, grade 2, no MVI, 0/25 lymph nodes, subserosal involvement, T3 N0 M0 stage II A, no mismatch repair)  Breast cancer surveillance: 1. Breast exam 05/31/21: No palpable areas of concern.  Left mastectomy. 2. Rt Mammogram 05/17/2020: Probable benign right breast calcifications follow-up mammogram in 6 months recommend 3.  05/19/2021: Right breast mammogram and ultrasound: 1.6 cm mass in the right breast 12 o'clock position with architectural distortion highly suspicious for malignancy, no axillary lymph nodes, biopsy: Grade 3 IDC ER 0%, PR 0%, HER2 negative 1+ by IHC, Ki-67 20%   Counseling: I discussed with the patient that the current breast cancer is completely different than the original breast  cancer.  It is triple negative disease.  Unfortunately it carries a much higher risk of distant recurrence. Traditionally adjuvant chemotherapy is recommended for triple negative breast cancer because of high risk of distant recurrence. My concern is her ability to tolerate aggressive systemic chemotherapy.  We may consider treatment with CMF as an alternative.   She is going to need the CT chest abdomen pelvis and bone scan, port placement if she proceeds and agrees to chemotherapy. She is seeing Dr. Barry Dienes this Friday. I will see her after surgery to discuss treatment plan.   No orders of the defined types were placed in this encounter.  The patient has a good understanding of the overall plan. she agrees with it. she will call with any problems that may develop before the next visit here.  Total time spent: 20  mins including face to face time and time spent for planning, charting and coordination of care  Rulon Eisenmenger, MD, MPH 06/01/2021  I, Thana Ates, am acting as scribe for Dr. Nicholas Lose.  I have reviewed the above documentation for accuracy and completeness, and I agree with the above.

## 2021-05-31 NOTE — Assessment & Plan Note (Addendum)
Left breast cancer invasive ductal carcinoma: 0.5 cm with 9 mm area of DCIS status post left mastectomy 01/04/2012 ER/PR positive HER-2 negative Ki-67 17% currently on Arimidex 1 mg daily Since 02/07/2012.Completed 7 years of October 2020  Breast cancer surveillance: 1. Breast exam2/14/23: No palpable areas of concern. Left mastectomy. 2.RtMammogram1/31/2022: Probable benign right breast calcifications follow-up mammogram in 6 months recommend  Cecal adenocarcinoma diagnosed 04/22/2013, preop CEA of 5.1, status post right hemicolectomy 04/28/2014, grade 2, no MVI, 0/25 lymph nodes, subserosal involvement, T3 N0 M0 stage II A, no mismatch repair  Current treatment: Surveillance with CEA(1.53 in 06/21/2020)  Return to clinic in1 yrfor follow-up

## 2021-06-01 ENCOUNTER — Encounter: Payer: Self-pay | Admitting: Emergency Medicine

## 2021-06-01 ENCOUNTER — Inpatient Hospital Stay: Payer: Medicare PPO | Attending: Hematology and Oncology | Admitting: Hematology and Oncology

## 2021-06-01 ENCOUNTER — Other Ambulatory Visit: Payer: Self-pay

## 2021-06-01 ENCOUNTER — Other Ambulatory Visit: Payer: Self-pay | Admitting: *Deleted

## 2021-06-01 DIAGNOSIS — Z171 Estrogen receptor negative status [ER-]: Secondary | ICD-10-CM | POA: Diagnosis not present

## 2021-06-01 DIAGNOSIS — M810 Age-related osteoporosis without current pathological fracture: Secondary | ICD-10-CM | POA: Insufficient documentation

## 2021-06-01 DIAGNOSIS — Z85038 Personal history of other malignant neoplasm of large intestine: Secondary | ICD-10-CM | POA: Insufficient documentation

## 2021-06-01 DIAGNOSIS — C50111 Malignant neoplasm of central portion of right female breast: Secondary | ICD-10-CM

## 2021-06-01 DIAGNOSIS — Z853 Personal history of malignant neoplasm of breast: Secondary | ICD-10-CM | POA: Insufficient documentation

## 2021-06-01 DIAGNOSIS — C50512 Malignant neoplasm of lower-outer quadrant of left female breast: Secondary | ICD-10-CM | POA: Diagnosis not present

## 2021-06-01 MED ORDER — CELECOXIB 100 MG PO CAPS: 100.0000 mg | ORAL_CAPSULE | Freq: Two times a day (BID) | ORAL | Status: DC

## 2021-06-01 MED ORDER — GABAPENTIN 100 MG PO CAPS: 100.0000 mg | ORAL_CAPSULE | Freq: Three times a day (TID) | ORAL | Status: DC

## 2021-06-01 NOTE — Research (Addendum)
[employer] 2021-05 - Specimen Collection Study to Evaluate Biomarkers in Subjects with Cancer   06/01/21  Informed Consent:  Patient Ann Maxwell was identified by Dr. Lindi Adie as a potential candidate for the above listed study.  This Clinical Research Coordinator met with Ann Maxwell, MRN [id number], on 06/01/21 in a manner and location that ensures patient privacy to discuss participation in the above listed research study.  Patient is Accompanied by her daughter .  A copy of the informed consent document with embedded HIPAA language was provided to the patient.  Patient reads, speaks, and understands [place].    Patient was provided with the business card of this Coordinator and encouraged to contact the research team with any questions.  Patient was provided the option of taking informed consent documents home to review and was encouraged to review at their convenience with their support network, including other care providers. Patient is comfortable with making a decision regarding study participation today.  As outlined in the informed consent form, this Coordinator and DREMA EDDINGTON discussed the purpose of the research study, the investigational nature of the study, study procedures and requirements for study participation, potential risks and benefits of study participation, as well as alternatives to participation. This study is not blinded. The patient understands participation is voluntary and they may withdraw from study participation at any time.  This study does not involve randomization.  This study does not involve an investigational drug or device. This study does not involve a placebo. Patient understands enrollment is pending full eligibility review.   Confidentiality and how the patient's information will be used as part of study participation were discussed.  Patient was informed there is reimbursement provided for their time and effort spent on trial participation.  The  patient is encouraged to discuss research study participation with their insurance provider to determine what costs they may incur as part of study participation, including research related injury.    All questions were answered to patient's satisfaction.  The informed consent with embedded HIPAA language was reviewed page by page.  The patient's mental and emotional status is appropriate to provide informed consent, and the patient verbalizes an understanding of study participation.  Patient has agreed to participate in the above listed research study and has voluntarily signed the informed consent version 30 Apr 2020, revised 16 May 2021 with embedded HIPAA language on 06/01/21 at 10:00AM.  The patient was provided with a copy of the signed informed consent form with embedded HIPAA language for their reference.  No study specific procedures were obtained prior to the signing of the informed consent document.  Approximately 20 minutes were spent with the patient reviewing the informed consent documents.  Patient was not requested to complete a Release of Information form.  Plan:  Will follow up with patient via phone to schedule blood specimen collection around her upcoming appointments.    Clabe Seal Clinical Research Coordinator I  06/01/21 10:31 AM   Addendum 06/01/2021: Addended to add physician co-signature.

## 2021-06-01 NOTE — Progress Notes (Signed)
Per MD request, RN placed referral for genetic testing.

## 2021-06-02 ENCOUNTER — Telehealth: Payer: Self-pay | Admitting: Hematology and Oncology

## 2021-06-02 ENCOUNTER — Encounter: Payer: Self-pay | Admitting: *Deleted

## 2021-06-02 NOTE — Telephone Encounter (Signed)
Sch per 2/15 inbasket , left msg

## 2021-06-03 ENCOUNTER — Telehealth: Payer: Self-pay | Admitting: Emergency Medicine

## 2021-06-03 ENCOUNTER — Other Ambulatory Visit: Payer: Self-pay | Admitting: General Surgery

## 2021-06-03 DIAGNOSIS — Z85038 Personal history of other malignant neoplasm of large intestine: Secondary | ICD-10-CM | POA: Diagnosis not present

## 2021-06-03 DIAGNOSIS — Z171 Estrogen receptor negative status [ER-]: Secondary | ICD-10-CM | POA: Diagnosis not present

## 2021-06-03 DIAGNOSIS — Z853 Personal history of malignant neoplasm of breast: Secondary | ICD-10-CM | POA: Diagnosis not present

## 2021-06-03 DIAGNOSIS — C50111 Malignant neoplasm of central portion of right female breast: Secondary | ICD-10-CM | POA: Diagnosis not present

## 2021-06-03 NOTE — Telephone Encounter (Signed)
[employer] 2021-05 - Specimen Collection Study to Evaluate Biomarkers in Subjects with Cancer   06/03/21  Patient called back requesting to reschedule research appointment.  Rescheduled for Tuesday 06/14/21 at 10:30am.    Patient denied further questions at this time.  Clabe Seal Clinical Research Coordinator I  06/03/21  5:00 PM

## 2021-06-03 NOTE — Telephone Encounter (Signed)
[employer] 2021-05 - Specimen Collection Study to Evaluate Biomarkers in Subjects with Cancer   06/03/21  Called to follow up with this patient regarding this study.  Patient denied questions at this time.  Scheduled for patient to come to clinic to consent to study on 06/10/21 at 10:30am, with labs to follow.  Clabe Seal Clinical Research Coordinator I  06/03/21  4:46 PM

## 2021-06-06 ENCOUNTER — Ambulatory Visit (HOSPITAL_COMMUNITY)
Admission: RE | Admit: 2021-06-06 | Discharge: 2021-06-06 | Disposition: A | Discharge status: Home / Self Care | Payer: Medicare PPO | Source: Ambulatory Visit | Attending: Hematology and Oncology | Admitting: Hematology and Oncology

## 2021-06-06 ENCOUNTER — Encounter: Payer: Self-pay | Admitting: *Deleted

## 2021-06-06 ENCOUNTER — Other Ambulatory Visit: Payer: Self-pay

## 2021-06-06 DIAGNOSIS — I2510 Atherosclerotic heart disease of native coronary artery without angina pectoris: Secondary | ICD-10-CM | POA: Diagnosis not present

## 2021-06-06 DIAGNOSIS — C50512 Malignant neoplasm of lower-outer quadrant of left female breast: Secondary | ICD-10-CM

## 2021-06-06 DIAGNOSIS — K862 Cyst of pancreas: Secondary | ICD-10-CM | POA: Diagnosis not present

## 2021-06-06 DIAGNOSIS — C50111 Malignant neoplasm of central portion of right female breast: Secondary | ICD-10-CM | POA: Insufficient documentation

## 2021-06-06 DIAGNOSIS — J984 Other disorders of lung: Secondary | ICD-10-CM | POA: Diagnosis not present

## 2021-06-06 DIAGNOSIS — Z171 Estrogen receptor negative status [ER-]: Secondary | ICD-10-CM

## 2021-06-06 DIAGNOSIS — M4317 Spondylolisthesis, lumbosacral region: Secondary | ICD-10-CM | POA: Diagnosis not present

## 2021-06-06 DIAGNOSIS — K7689 Other specified diseases of liver: Secondary | ICD-10-CM | POA: Diagnosis not present

## 2021-06-06 DIAGNOSIS — Z853 Personal history of malignant neoplasm of breast: Secondary | ICD-10-CM | POA: Diagnosis not present

## 2021-06-06 MED ORDER — IOHEXOL 300 MG/ML  SOLN
100.0000 mL | Freq: Once | INTRAMUSCULAR | Status: AC | PRN
  Administered 2021-06-06: 100 mL via INTRAVENOUS

## 2021-06-09 DIAGNOSIS — M1711 Unilateral primary osteoarthritis, right knee: Secondary | ICD-10-CM | POA: Diagnosis not present

## 2021-06-09 DIAGNOSIS — M25552 Pain in left hip: Secondary | ICD-10-CM | POA: Diagnosis not present

## 2021-06-10 ENCOUNTER — Encounter: Payer: Medicare PPO | Admitting: Emergency Medicine

## 2021-06-10 ENCOUNTER — Other Ambulatory Visit: Payer: Self-pay

## 2021-06-10 ENCOUNTER — Encounter (HOSPITAL_COMMUNITY)
Admission: RE | Admit: 2021-06-10 | Discharge: 2021-06-10 | Disposition: A | Discharge status: Home / Self Care | Payer: Medicare PPO | Source: Ambulatory Visit | Attending: Hematology and Oncology | Admitting: Hematology and Oncology

## 2021-06-10 DIAGNOSIS — C50512 Malignant neoplasm of lower-outer quadrant of left female breast: Secondary | ICD-10-CM | POA: Insufficient documentation

## 2021-06-10 DIAGNOSIS — M19071 Primary osteoarthritis, right ankle and foot: Secondary | ICD-10-CM | POA: Diagnosis not present

## 2021-06-10 DIAGNOSIS — C50919 Malignant neoplasm of unspecified site of unspecified female breast: Secondary | ICD-10-CM | POA: Diagnosis not present

## 2021-06-10 DIAGNOSIS — M5134 Other intervertebral disc degeneration, thoracic region: Secondary | ICD-10-CM | POA: Diagnosis not present

## 2021-06-10 DIAGNOSIS — Z171 Estrogen receptor negative status [ER-]: Secondary | ICD-10-CM | POA: Diagnosis not present

## 2021-06-10 DIAGNOSIS — M170 Bilateral primary osteoarthritis of knee: Secondary | ICD-10-CM | POA: Diagnosis not present

## 2021-06-10 DIAGNOSIS — C50111 Malignant neoplasm of central portion of right female breast: Secondary | ICD-10-CM | POA: Insufficient documentation

## 2021-06-10 MED ORDER — TECHNETIUM TC 99M MEDRONATE IV KIT
20.7000 | PACK | Freq: Once | INTRAVENOUS | Status: AC
  Administered 2021-06-10: 20.7 via INTRAVENOUS

## 2021-06-13 ENCOUNTER — Encounter: Payer: Self-pay | Admitting: *Deleted

## 2021-06-13 ENCOUNTER — Telehealth: Payer: Self-pay

## 2021-06-13 NOTE — Telephone Encounter (Signed)
-----   Message from Gardenia Phlegm, NP sent at 06/12/2021  8:16 PM EST ----- Bone scan negative, please call her and let her know. ----- Message ----- From: Interface, Rad Results In Sent: 06/11/2021  10:18 PM EST To: Nicholas Lose, MD

## 2021-06-13 NOTE — Telephone Encounter (Signed)
Called and spoke with pt, informed her that per [facility] bone scan is negative.  Pt verbalized understanding and thanks

## 2021-06-14 ENCOUNTER — Inpatient Hospital Stay: Payer: Medicare PPO | Admitting: Emergency Medicine

## 2021-06-14 ENCOUNTER — Other Ambulatory Visit: Payer: Self-pay | Admitting: *Deleted

## 2021-06-14 ENCOUNTER — Inpatient Hospital Stay: Payer: Medicare PPO

## 2021-06-14 ENCOUNTER — Other Ambulatory Visit: Payer: Self-pay

## 2021-06-14 DIAGNOSIS — Z171 Estrogen receptor negative status [ER-]: Secondary | ICD-10-CM

## 2021-06-14 DIAGNOSIS — Z006 Encounter for examination for normal comparison and control in clinical research program: Secondary | ICD-10-CM

## 2021-06-14 DIAGNOSIS — C50111 Malignant neoplasm of central portion of right female breast: Secondary | ICD-10-CM

## 2021-06-14 LAB — RESEARCH LABS

## 2021-06-14 NOTE — Research (Signed)
[employer] 2021-05 - Specimen Collection Study to Evaluate Biomarkers in Subjects with Cancer   06/14/21  Informed Consent:  Patient Ann Maxwell was identified by Dr. Lindi Adie as a potential candidate for the above listed study.  This Clinical Research Coordinator met with Ann Maxwell, MRN [id number] on 06/14/21 in a manner and location that ensures patient privacy to discuss participation in the above listed research study.  Patient is Unaccompanied.  Patient was previously provided with informed consent documents.  Patient confirmed they have read the informed consent documents.  As outlined in the informed consent form, this Coordinator and Ann Maxwell discussed the purpose of the research study, the investigational nature of the study, study procedures and requirements for study participation, potential risks and benefits of study participation, as well as alternatives to participation.  This study is not blinded or double-blinded. The patient understands participation is voluntary and they may withdraw from study participation at any time.  This study does not involve randomization.  This study does not involve an investigational drug or device. This study does not involve a placebo. Patient understands enrollment is pending full eligibility review.   Confidentiality and how the patient's information will be used as part of study participation were discussed.  Patient was informed there is reimbursement provided for their time and effort spent on trial participation.  The patient is encouraged to discuss research study participation with their insurance provider to determine what costs they may incur as part of study participation, including research related injury.    All questions were answered to patient's satisfaction.  The informed consent with embedded HIPAA language was reviewed page by page.  The patient's mental and emotional status is appropriate to provide informed consent,  and the patient verbalizes an understanding of study participation.  Patient has agreed to participate in the above listed research study and has voluntarily signed the informed consent version revised 16 May 2021 with embedded HIPAA language, version revised 16 May 2021  on 06/14/21 at 10:45 AM.  The patient was provided with a copy of the signed informed consent form with embedded HIPAA language for their reference.  No study specific procedures were obtained prior to the signing of the informed consent document.  Approximately 10 minutes were spent with the patient reviewing the informed consent documents.  Patient was not requested to complete a Release of Information form.  Eligibility:  This Coordinator has reviewed this patient's inclusion and exclusion criteria and confirmed Ann Maxwell is eligible for study participation.  Patient will continue with enrollment.  Specimen Collection:  Blood specimen was collected via venipuncture at 10:49am.  Gift Card:  A $50 gift card was given to the patient for participation in today's research activities.  Data Collection:  This patient reports past medical history of high blood pressure, and she denies past medical history of coronary artery disease, lupus, rheumatoid arthritis, diabetes, or lynch syndrome.  This patient is not taking magnesium supplements.  The patient does report family history of cancer in 1st or 2nd degree relatives.  She reports history of ovarian cancer in her mother, and thyroid cancer in her sister.  The patient reports no history of alcohol consumption.  The patient reports no history of tobacco use.   Clabe Seal Clinical Research Coordinator I  06/14/21 1:54 PM

## 2021-06-14 NOTE — Research (Signed)
[employer] 2021-05 - Specimen Collection Study to Evaluate Biomarkers in Subjects with Cancer   This Nurse has reviewed this patient's inclusion and exclusion criteria as a second review and confirms Ann Maxwell is eligible for study participation.  Patient may continue with enrollment.  Foye Spurling, BSN, RN, [employer] Research Nurse II 06/14/2021 10:52 AM

## 2021-06-15 ENCOUNTER — Other Ambulatory Visit: Payer: Self-pay

## 2021-06-15 ENCOUNTER — Encounter (HOSPITAL_BASED_OUTPATIENT_CLINIC_OR_DEPARTMENT_OTHER): Payer: Self-pay | Admitting: General Surgery

## 2021-06-16 ENCOUNTER — Encounter (HOSPITAL_BASED_OUTPATIENT_CLINIC_OR_DEPARTMENT_OTHER)
Admission: RE | Admit: 2021-06-16 | Discharge: 2021-06-16 | Disposition: A | Discharge status: Home / Self Care | Payer: Medicare PPO | Source: Ambulatory Visit | Attending: General Surgery | Admitting: General Surgery

## 2021-06-16 DIAGNOSIS — Z01812 Encounter for preprocedural laboratory examination: Secondary | ICD-10-CM | POA: Diagnosis not present

## 2021-06-16 DIAGNOSIS — I10 Essential (primary) hypertension: Secondary | ICD-10-CM | POA: Diagnosis not present

## 2021-06-16 LAB — BASIC METABOLIC PANEL
Anion gap: 7 (ref 5–15)
BUN: 19 mg/dL (ref 8–23)
CO2: 28 mmol/L (ref 22–32)
Calcium: 9 mg/dL (ref 8.9–10.3)
Chloride: 103 mmol/L (ref 98–111)
Creatinine, Ser: 0.67 mg/dL (ref 0.44–1.00)
GFR, Estimated: >60 mL/min (ref >60)
Glucose, Bld: 120 mg/dL — ABNORMAL HIGH (ref 70–99)
Potassium: 4.5 mmol/L (ref 3.5–5.1)
Sodium: 138 mmol/L (ref 135–145)

## 2021-06-16 NOTE — Progress Notes (Signed)

## 2021-06-21 DIAGNOSIS — C50912 Malignant neoplasm of unspecified site of left female breast: Secondary | ICD-10-CM | POA: Diagnosis not present

## 2021-06-21 DIAGNOSIS — C50911 Malignant neoplasm of unspecified site of right female breast: Secondary | ICD-10-CM | POA: Diagnosis not present

## 2021-06-22 NOTE — H&P (Signed)
REFERRING PHYSICIAN: Philip Aspen  PROVIDER: Georgianne Fick, MD  Care Team: Patient Care Team: Elise Benne, MD as PCP - General (Internal Medicine)   MRN: [id number] DOB: 07/09/1940 DATE OF ENCOUNTER: 06/03/2021  Subjective   Chief Complaint: breast surgery   History of Present Illness: Ann Maxwell is a 81 y.o. female who is seen today as an office consultation at the request of Dr. Philip Aspen for evaluation of breast surgery .   Patient is a lovely lady who I have known for at least 10 years. She had a left mastectomy and sentinel node biopsy for DCIS in 2013. I later performed a laparoscopic right colectomy in 2016 for a pT3pN0 right colon cancer. She did not have evidence of microsatellite instability. She did not require additional adjuvant chemotherapy. She did receive adjuvant antiestrogen after her mastectomy and completed 5 years of that.  She now presents with a new diagnosis of a right colon cancer. She had some calcifications that were undergoing 44-monthfollow-up. The calcifications appeared stable. However ,there was a possible mass. Diagnostic imaging confirmed a 1.6 cm mass with architectural distortion at 12:00, 4 cm from the nipple. She underwent core needle biopsy. This showed invasive ductal carcinoma grade 3. The tumor was triple negative.  She has seen Dr. GLindi Adieand discussed chemotherapy. This is planned as well as staging studies. Of note, she did have genetic testing in 2016 which was negative.  Diagnostic mammogram/us 05/2021 ACR Breast Density Category c: The breast tissue is heterogeneously dense, which may obscure small masses.   FINDINGS: In the upper inner right breast, is an area of architectural distortion with a possible irregular mass.   Small round and punctate calcifications that are scattered relatively diffusely throughout the fibroglandular tissue are stable.   Targeted ultrasound is performed, showing an irregular  hypoechoic mass with irregular and ill-defined margins, at 12 o'clock, 4 cm from the nipple, associated with architectural distortion, measuring 1.6 x 1.2 x 1.3 cm. Sonographic evaluation of the right axilla shows normal lymph nodes.   IMPRESSION: 1. 1.6 cm mass right breast at 12 o'clock, associated with architectural distortion, highly suspicious for malignancy. No evidence of metastatic right axilla lymphadenopathy. 2. Scattered benign right breast calcifications.   RECOMMENDATION: 1. Ultrasound-guided core needle biopsy of the 12 o'clock position right breast mass. This was scheduled to the patient being discharged from the breast Center.   I have discussed the findings and recommendations with the patient. If applicable, a reminder letter will be sent to the patient regarding the next appointment.   BI-RADS CATEGORY 5: Highly suggestive of malignancy.  Pathology core needle biopsy: 05/27/2021 Breast, right, needle core biopsy, 12:00 - INVASIVE DUCTAL CARCINOMA Based on the biopsy, the carcinoma appears Nottingham grade 3 of 3 and measures 1.0 cm in greatest linear extent.  Receptors: The tumor cells are NEGATIVE for Her2 (1+). Estrogen Receptor: 0%, NEGATIVE Progesterone Receptor: <1%, NEGATIVE Proliferation Marker Ki67: 20%  Review of Systems: A complete review of systems was obtained from the patient. I have reviewed this information and discussed as appropriate with the patient. See HPI as well for other ROS.  Review of Systems  HENT: Positive for congestion and hearing loss.  Respiratory: Positive for wheezing.  Neurological: Positive for headaches.  Endo/Heme/Allergies: Bruises/bleeds easily.  Psychiatric/Behavioral: The patient is nervous/anxious.  All other systems reviewed and are negative.   Medical History: Past Medical History:  Diagnosis Date   Anxiety   Asthma, unspecified asthma severity, unspecified whether complicated, unspecified  whether  persistent   History of cancer   Hypertension   Patient Active Problem List  Diagnosis   Malignant neoplasm of central portion of right breast in female, estrogen receptor negative (CMS-HCC)   History of left breast cancer   History of colon cancer   Past Surgical History:  Procedure Laterality Date   CHOLECYSTECTOMY   MASTECTOMY    Allergies  Allergen Reactions   Other Other (See Comments) and Shortness Of Breath  Tree and shrub pollen   Adhesive Tape-Silicones Rash   Oxycodone-Acetaminophen Abdominal Pain and Unknown   Tree And Shrub Pollen Other (See Comments)   Hydrocodone Nausea   Tetracycline Other (See Comments)   Tyloxapol Other (See Comments)  Tylox   Oxytetracycline Rash   Current Outpatient Medications on File Prior to Visit  Medication Sig Dispense Refill   celecoxib (CELEBREX) 100 MG capsule Take 100 mg by mouth 2 (two) times daily   ergocalciferol, vitamin D2, 1,250 mcg (50,000 unit) capsule ergocalciferol (vitamin D2) 1,250 mcg (50,000 unit) capsule TAKE 1 CAPSULE BY MOUTH ONCE WEEKLY.   gabapentin (NEURONTIN) 100 MG capsule Take 100 mg by mouth 3 (three) times daily   hydroCHLOROthiazide (MICROZIDE) 12.5 mg capsule hydrochlorothiazide 12.5 mg capsule TAKE 1 CAPSULE BY MOUTH EVERY DAY   acetaminophen (TYLENOL EXTRA STRENGTH) 500 MG tablet every 4 (four) hours   albuterol 90 mcg/actuation inhaler Inhale into the lungs   alendronate (FOSAMAX) 70 MG tablet alendronate 70 mg tablet TAKE 1 TABLET BY MOUTH ONCE WEEKLY.   alendronate (FOSAMAX) 70 MG tablet TAKE 1 TABLET BY MOUTH ONCE WEEKLY.   losartan (COZAAR) 100 MG tablet losartan 100 mg tablet TAKE 1 TABLET BY MOUTH EVERY DAY   montelukast (SINGULAIR) 10 mg tablet montelukast 10 mg tablet TAKE 1 TABLET BY MOUTH EVERY DAY   No current facility-administered medications on file prior to visit.   Family History  Problem Relation Age of Onset   Stroke Father   High blood pressure (Hypertension) Father    Heart valve disease Father    Social History   Tobacco Use  Smoking Status Never  Smokeless Tobacco Never    Social History   Socioeconomic History   Marital status: Divorced  Tobacco Use   Smoking status: Never   Smokeless tobacco: Never  Vaping Use   Vaping Use: Never used  Substance and Sexual Activity   Alcohol use: Never   Drug use: Never   Objective:   Vitals:  06/03/21 0946  BP: 138/86  Pulse: 102  Temp: 37.4 C (99.4 F)  SpO2: 93%  Weight: 92.9 kg (204 lb 12.8 oz)  Height: 161.3 cm (5' 3.5")   Body mass index is 35.71 kg/m.  Gen: No acute distress. Well nourished and well groomed.  Neurological: Alert and oriented to person, place, and time. Coordination normal.  Head: Normocephalic and atraumatic.  Eyes: Conjunctivae are normal. Pupils are equal, round, and reactive to light. No scleral icterus.  Neck: Normal range of motion. Neck supple. No tracheal deviation or thyromegaly present.  Cardiovascular: Normal rate, regular rhythm, normal heart sounds and intact distal pulses. Exam reveals no gallop and no friction rub. No murmur heard. Breast: Left breast is surgically absent. There are no chest wall nodules or lymphadenopathy on this side. Right breast is ptotic. There is a bit of fullness in the location where the cancer is, but there is not a discrete mass. No lymphadenopathy on the right. She has no nipple retraction, nipple discharge, skin dimpling. Respiratory: Effort  normal. No respiratory distress. No chest wall tenderness. Breath sounds normal. No wheezes, rales or rhonchi.  GI: Soft. Bowel sounds are normal. The abdomen is soft and nontender. There is no rebound and no guarding.  Musculoskeletal: Normal range of motion. Extremities are nontender.  Lymphadenopathy: No cervical, preauricular, postauricular or axillary adenopathy is present Skin: Skin is warm and dry. No rash noted. No diaphoresis. No erythema. No pallor. No clubbing, cyanosis, or edema.   Psychiatric: Normal mood and affect. Behavior is normal. Judgment and thought content normal.   Labs 05/11/21 CBC with slightly elevated HCT which is stable. INR normal CMET normal  Assessment and Plan:   Malignant neoplasm of central portion of right breast in female, estrogen receptor negative (CMS-HCC) (primary encounter diagnosis)  History of left breast cancer  History of colon cancer  Patient has a new diagnosis of triple negative right breast cancer. I discussed surgical options with the patient and her daughter. I reviewed that she would be a candidate for a lumpectomy and sentinel node biopsy if desired. The patient would like to have a right mastectomy for symmetry given her previous left mastectomy. She is not interested in reconstruction.  She will also need a Port-A-Cath for chemotherapy. She will not need adjuvant antihormone treatment since this was triple negative. Radiation will depend on margins, total tumor size, and presence of any cancer in the lymph nodes. We will defer referral to radiation unless she meets criteria.  I reviewed risks of surgery. I discussed risks of bleeding, infection, wound complications, prolonged need for drain. I discussed risk of heart and lung complications. She does not have any significant cardiac history. She has a history of asthma, but does not need rescue inhalers. Her disease is controlled with Advair and Singulair. I reviewed risk of blood clot, return to the operating room, possible need for additional procedures. I discussed risk of port placement.  She is having staging scans that Dr. Lindi Adie has ordered. These are set up for next week.  She is also going to get updated genetics given that now she has had a third cancer and that this cancer is triple negative.  Her right knee replacement will have to be delayed at least 6 months for breast cancer treatment.  Milus Height, MD FACS Surgical Oncology, General Surgery, Trauma and  [facility]

## 2021-06-22 NOTE — Anesthesia Preprocedure Evaluation (Addendum)
Anesthesia Evaluation  ?Patient identified by MRN, date of birth, ID band ?Patient awake ? ? ? ?Reviewed: ?Allergy & Precautions, NPO status , Patient's Chart, lab work & pertinent test results ? ?Airway ?Mallampati: III ? ?TM Distance: >3 FB ?Neck ROM: Limited ? ?Mouth opening: Limited Mouth Opening ? Dental ?no notable dental hx. ? ?  ?Pulmonary ?asthma ,  ?  ?Pulmonary exam normal ?breath sounds clear to auscultation ? ? ? ? ? ? Cardiovascular ?Exercise Tolerance: Poor ?hypertension, Normal cardiovascular exam+ dysrhythmias (second degree AV block)  ?Rhythm:Regular Rate:Normal ? ? ?  ?Neuro/Psych ?PSYCHIATRIC DISORDERS Anxiety Depression negative neurological ROS ?   ? GI/Hepatic ?negative GI ROS, Neg liver ROS,   ?Endo/Other  ?obesity ? Renal/GU ?negative Renal ROS  ?negative genitourinary ?  ?Musculoskeletal ? ?(+) Arthritis , Osteoarthritis,  Back pain, shoulder bone spurs  ? Abdominal ?(+) + obese,   ?Peds ?negative pediatric ROS ?(+)  Hematology ? ?(+) Blood dyscrasia, anemia ,   ?Anesthesia Other Findings ?Breast cancer ? Reproductive/Obstetrics ?negative OB ROS ? ?  ? ? ? ? ? ? ? ? ? ? ? ? ? ?  ?  ? ? ? ? ? ? ? ?Anesthesia Physical ?Anesthesia Plan ? ?ASA: 3 ? ?Anesthesia Plan: General and Regional  ? ?Post-op Pain Management: Regional block* and Tylenol PO (pre-op)*  ? ?Induction: Intravenous ? ?PONV Risk Score and Plan: 3 and Treatment may vary due to age or medical condition, Ondansetron and Dexamethasone ? ?Airway Management Planned: LMA ? ?Additional Equipment: None ? ?Intra-op Plan:  ? ?Post-operative Plan: Extubation in OR ? ?Informed Consent: I have reviewed the patients History and Physical, chart, labs and discussed the procedure including the risks, benefits and alternatives for the proposed anesthesia with the patient or authorized representative who has indicated his/her understanding and acceptance.  ? ? ? ?Dental advisory given ? ?Plan Discussed with:  Anesthesiologist, CRNA and Surgeon ? ?Anesthesia Plan Comments:   ? ? ? ? ? ?Anesthesia Quick Evaluation ? ?

## 2021-06-23 ENCOUNTER — Other Ambulatory Visit: Payer: Medicare PPO

## 2021-06-23 ENCOUNTER — Other Ambulatory Visit: Payer: Self-pay

## 2021-06-23 ENCOUNTER — Ambulatory Visit (HOSPITAL_BASED_OUTPATIENT_CLINIC_OR_DEPARTMENT_OTHER)
Admission: RE | Admit: 2021-06-23 | Discharge: 2021-06-24 | Disposition: A | Discharge status: Home / Self Care | Payer: Medicare PPO | Attending: General Surgery | Admitting: General Surgery

## 2021-06-23 ENCOUNTER — Encounter (HOSPITAL_BASED_OUTPATIENT_CLINIC_OR_DEPARTMENT_OTHER)
Admission: RE | Disposition: A | Discharge status: Home / Self Care | Payer: Self-pay | Source: Home / Self Care | Attending: General Surgery

## 2021-06-23 ENCOUNTER — Ambulatory Visit (HOSPITAL_COMMUNITY): Payer: Medicare PPO

## 2021-06-23 ENCOUNTER — Encounter (HOSPITAL_BASED_OUTPATIENT_CLINIC_OR_DEPARTMENT_OTHER): Payer: Self-pay | Admitting: General Surgery

## 2021-06-23 ENCOUNTER — Ambulatory Visit (HOSPITAL_BASED_OUTPATIENT_CLINIC_OR_DEPARTMENT_OTHER): Payer: Medicare PPO | Admitting: Anesthesiology

## 2021-06-23 DIAGNOSIS — C50111 Malignant neoplasm of central portion of right female breast: Secondary | ICD-10-CM | POA: Insufficient documentation

## 2021-06-23 DIAGNOSIS — J45909 Unspecified asthma, uncomplicated: Secondary | ICD-10-CM | POA: Insufficient documentation

## 2021-06-23 DIAGNOSIS — I10 Essential (primary) hypertension: Secondary | ICD-10-CM | POA: Diagnosis not present

## 2021-06-23 DIAGNOSIS — Z171 Estrogen receptor negative status [ER-]: Secondary | ICD-10-CM | POA: Diagnosis not present

## 2021-06-23 DIAGNOSIS — Z9049 Acquired absence of other specified parts of digestive tract: Secondary | ICD-10-CM | POA: Diagnosis not present

## 2021-06-23 DIAGNOSIS — E669 Obesity, unspecified: Secondary | ICD-10-CM | POA: Insufficient documentation

## 2021-06-23 DIAGNOSIS — Z9012 Acquired absence of left breast and nipple: Secondary | ICD-10-CM | POA: Insufficient documentation

## 2021-06-23 DIAGNOSIS — Z853 Personal history of malignant neoplasm of breast: Secondary | ICD-10-CM

## 2021-06-23 DIAGNOSIS — Z85038 Personal history of other malignant neoplasm of large intestine: Secondary | ICD-10-CM | POA: Diagnosis not present

## 2021-06-23 DIAGNOSIS — J9811 Atelectasis: Secondary | ICD-10-CM | POA: Diagnosis not present

## 2021-06-23 DIAGNOSIS — C50911 Malignant neoplasm of unspecified site of right female breast: Secondary | ICD-10-CM | POA: Diagnosis not present

## 2021-06-23 DIAGNOSIS — Z95828 Presence of other vascular implants and grafts: Secondary | ICD-10-CM

## 2021-06-23 DIAGNOSIS — M1990 Unspecified osteoarthritis, unspecified site: Secondary | ICD-10-CM | POA: Diagnosis not present

## 2021-06-23 DIAGNOSIS — Z6835 Body mass index (BMI) 35.0-35.9, adult: Secondary | ICD-10-CM | POA: Diagnosis not present

## 2021-06-23 DIAGNOSIS — G8918 Other acute postprocedural pain: Secondary | ICD-10-CM | POA: Diagnosis not present

## 2021-06-23 DIAGNOSIS — C773 Secondary and unspecified malignant neoplasm of axilla and upper limb lymph nodes: Secondary | ICD-10-CM | POA: Diagnosis not present

## 2021-06-23 DIAGNOSIS — F418 Other specified anxiety disorders: Secondary | ICD-10-CM

## 2021-06-23 HISTORY — PX: SENTINEL NODE BIOPSY: SHX6608

## 2021-06-23 HISTORY — PX: PORTACATH PLACEMENT: SHX2246

## 2021-06-23 HISTORY — PX: SIMPLE MASTECTOMY WITH AXILLARY SENTINEL NODE BIOPSY: SHX6098

## 2021-06-23 SURGERY — SIMPLE MASTECTOMY
Anesthesia: Regional | Site: Chest | Laterality: Right

## 2021-06-23 MED ORDER — LORATADINE 10 MG PO TABS: 10.0000 mg | ORAL_TABLET | Freq: Every day | ORAL | Status: DC | PRN

## 2021-06-23 MED ORDER — KCL IN DEXTROSE-NACL 20-5-0.45 MEQ/L-%-% IV SOLN
INTRAVENOUS | Status: AC
  Filled 2021-06-23: qty 1000

## 2021-06-23 MED ORDER — GABAPENTIN 100 MG PO CAPS
100.0000 mg | ORAL_CAPSULE | ORAL | Status: AC
  Administered 2021-06-23: 07:00:00 100 mg via ORAL

## 2021-06-23 MED ORDER — HYDROCHLOROTHIAZIDE 12.5 MG PO TABS: 12.5000 mg | ORAL_TABLET | Freq: Every day | ORAL | Status: DC

## 2021-06-23 MED ORDER — 0.9 % SODIUM CHLORIDE (POUR BTL) OPTIME
TOPICAL | Status: DC | PRN
  Administered 2021-06-23: 10:00:00 800 mL

## 2021-06-23 MED ORDER — MORPHINE SULFATE (PF) 4 MG/ML IV SOLN: 1.0000 mg | INTRAVENOUS | Status: DC | PRN

## 2021-06-23 MED ORDER — FENTANYL CITRATE (PF) 100 MCG/2ML IJ SOLN
100.0000 ug | Freq: Once | INTRAMUSCULAR | Status: AC
  Administered 2021-06-23: 07:00:00 50 ug via INTRAVENOUS

## 2021-06-23 MED ORDER — MIDAZOLAM HCL 2 MG/2ML IJ SOLN
INTRAMUSCULAR | Status: AC
  Filled 2021-06-23: qty 2

## 2021-06-23 MED ORDER — OXYCODONE HCL 5 MG PO TABS: 5.0000 mg | ORAL_TABLET | Freq: Once | ORAL | Status: DC | PRN

## 2021-06-23 MED ORDER — FENTANYL CITRATE (PF) 100 MCG/2ML IJ SOLN
INTRAMUSCULAR | Status: AC
  Filled 2021-06-23: qty 2

## 2021-06-23 MED ORDER — ACETAMINOPHEN 325 MG RE SUPP: 650.0000 mg | Freq: Four times a day (QID) | RECTAL | Status: DC | PRN

## 2021-06-23 MED ORDER — LOSARTAN POTASSIUM 50 MG PO TABS: 50.0000 mg | ORAL_TABLET | Freq: Every evening | ORAL | Status: DC

## 2021-06-23 MED ORDER — BUPIVACAINE HCL (PF) 0.5 % IJ SOLN
INTRAMUSCULAR | Status: DC | PRN
  Administered 2021-06-23: 20 mL

## 2021-06-23 MED ORDER — ONDANSETRON HCL 4 MG/2ML IJ SOLN
INTRAMUSCULAR | Status: DC | PRN
  Administered 2021-06-23: 4 mg via INTRAVENOUS

## 2021-06-23 MED ORDER — MELATONIN 5 MG PO TABS: 5.0000 mg | ORAL_TABLET | Freq: Every evening | ORAL | Status: DC | PRN

## 2021-06-23 MED ORDER — HEPARIN SOD (PORK) LOCK FLUSH 100 UNIT/ML IV SOLN
INTRAVENOUS | Status: AC
  Filled 2021-06-23: qty 5

## 2021-06-23 MED ORDER — DIPHENHYDRAMINE HCL 12.5 MG/5ML PO ELIX: 12.5000 mg | ORAL_SOLUTION | Freq: Four times a day (QID) | ORAL | Status: DC | PRN

## 2021-06-23 MED ORDER — DIPHENHYDRAMINE HCL 50 MG/ML IJ SOLN: 12.5000 mg | Freq: Four times a day (QID) | INTRAMUSCULAR | Status: DC | PRN

## 2021-06-23 MED ORDER — CEFAZOLIN SODIUM-DEXTROSE 2-4 GM/100ML-% IV SOLN
INTRAVENOUS | Status: AC
  Filled 2021-06-23: qty 100

## 2021-06-23 MED ORDER — PROPOFOL 500 MG/50ML IV EMUL
INTRAVENOUS | Status: DC | PRN
  Administered 2021-06-23: 25 ug/kg/min via INTRAVENOUS

## 2021-06-23 MED ORDER — DOCUSATE SODIUM 100 MG PO CAPS
100.0000 mg | ORAL_CAPSULE | Freq: Two times a day (BID) | ORAL | Status: DC
  Administered 2021-06-23: 21:00:00 100 mg via ORAL
  Filled 2021-06-23: qty 1

## 2021-06-23 MED ORDER — CHLORHEXIDINE GLUCONATE CLOTH 2 % EX PADS: 6.0000 | MEDICATED_PAD | Freq: Once | CUTANEOUS | Status: DC

## 2021-06-23 MED ORDER — ACETAMINOPHEN 325 MG PO TABS: 650.0000 mg | ORAL_TABLET | Freq: Four times a day (QID) | ORAL | Status: DC | PRN

## 2021-06-23 MED ORDER — BUPIVACAINE LIPOSOME 1.3 % IJ SUSP
INTRAMUSCULAR | Status: DC | PRN
Start: 2021-06-23 — End: 2021-06-23
  Administered 2021-06-23: 10 mL

## 2021-06-23 MED ORDER — GABAPENTIN 100 MG PO CAPS
ORAL_CAPSULE | ORAL | Status: AC
  Filled 2021-06-23: qty 1

## 2021-06-23 MED ORDER — GABAPENTIN 100 MG PO CAPS
100.0000 mg | ORAL_CAPSULE | Freq: Every day | ORAL | Status: DC
  Administered 2021-06-23: 21:00:00 100 mg via ORAL

## 2021-06-23 MED ORDER — MOMETASONE FURO-FORMOTEROL FUM 200-5 MCG/ACT IN AERO
2.0000 | INHALATION_SPRAY | Freq: Two times a day (BID) | RESPIRATORY_TRACT | Status: DC
Start: 2021-06-23 — End: 2021-06-24

## 2021-06-23 MED ORDER — HEPARIN (PORCINE) IN NACL 1000-0.9 UT/500ML-% IV SOLN
INTRAVENOUS | Status: AC
  Filled 2021-06-23: qty 500

## 2021-06-23 MED ORDER — POLYVINYL ALCOHOL 1.4 % OP SOLN: 2.0000 [drp] | OPHTHALMIC | Status: DC | PRN

## 2021-06-23 MED ORDER — LIDOCAINE-EPINEPHRINE (PF) 1 %-1:200000 IJ SOLN
INTRAMUSCULAR | Status: DC | PRN
  Administered 2021-06-23: 7 mL via INTRAMUSCULAR

## 2021-06-23 MED ORDER — CEFAZOLIN SODIUM-DEXTROSE 2-3 GM-%(50ML) IV SOLR
INTRAVENOUS | Status: DC | PRN
  Administered 2021-06-23: 2 g via INTRAVENOUS

## 2021-06-23 MED ORDER — PROPOFOL 10 MG/ML IV BOLUS
INTRAVENOUS | Status: AC
  Filled 2021-06-23: qty 20

## 2021-06-23 MED ORDER — DEXAMETHASONE SODIUM PHOSPHATE 10 MG/ML IJ SOLN
INTRAMUSCULAR | Status: AC
  Filled 2021-06-23: qty 1

## 2021-06-23 MED ORDER — HEPARIN (PORCINE) IN NACL 2-0.9 UNITS/ML
INTRAMUSCULAR | Status: AC | PRN
  Administered 2021-06-23: 1

## 2021-06-23 MED ORDER — METHOCARBAMOL 500 MG PO TABS: 500.0000 mg | ORAL_TABLET | Freq: Four times a day (QID) | ORAL | 1 refills | Status: DC | PRN

## 2021-06-23 MED ORDER — ONDANSETRON HCL 4 MG/2ML IJ SOLN: 4.0000 mg | Freq: Once | INTRAMUSCULAR | Status: DC | PRN

## 2021-06-23 MED ORDER — LIDOCAINE HCL (CARDIAC) PF 100 MG/5ML IV SOSY
PREFILLED_SYRINGE | INTRAVENOUS | Status: DC | PRN
  Administered 2021-06-23: 80 mg via INTRAVENOUS

## 2021-06-23 MED ORDER — HYDROCODONE-ACETAMINOPHEN 5-325 MG PO TABS: 1.0000 | ORAL_TABLET | ORAL | 0 refills | Status: DC | PRN

## 2021-06-23 MED ORDER — ACETAMINOPHEN 500 MG PO TABS
ORAL_TABLET | ORAL | Status: AC
  Filled 2021-06-23: qty 2

## 2021-06-23 MED ORDER — PHENYLEPHRINE HCL (PRESSORS) 10 MG/ML IV SOLN
INTRAVENOUS | Status: DC | PRN
  Administered 2021-06-23 (×2): 80 ug via INTRAVENOUS
  Administered 2021-06-23: 200 ug via INTRAVENOUS
  Administered 2021-06-23: 120 ug via INTRAVENOUS

## 2021-06-23 MED ORDER — FENTANYL CITRATE (PF) 100 MCG/2ML IJ SOLN
25.0000 ug | INTRAMUSCULAR | Status: DC | PRN
  Administered 2021-06-23 (×4): 25 ug via INTRAVENOUS

## 2021-06-23 MED ORDER — LACTATED RINGERS IV SOLN: INTRAVENOUS | Status: DC

## 2021-06-23 MED ORDER — HYDROCORTISONE 2.5 % RE CREA: 1.0000 "application " | TOPICAL_CREAM | Freq: Three times a day (TID) | RECTAL | Status: DC | PRN

## 2021-06-23 MED ORDER — PROCHLORPERAZINE EDISYLATE 10 MG/2ML IJ SOLN: 5.0000 mg | Freq: Four times a day (QID) | INTRAMUSCULAR | Status: DC | PRN

## 2021-06-23 MED ORDER — PROPOFOL 10 MG/ML IV BOLUS
INTRAVENOUS | Status: DC | PRN
Start: 2021-06-23 — End: 2021-06-23
  Administered 2021-06-23: 150 mg via INTRAVENOUS

## 2021-06-23 MED ORDER — PROCHLORPERAZINE MALEATE 10 MG PO TABS
10.0000 mg | ORAL_TABLET | Freq: Four times a day (QID) | ORAL | Status: DC | PRN
  Filled 2021-06-23: qty 1

## 2021-06-23 MED ORDER — ONDANSETRON 4 MG PO TBDP: 4.0000 mg | ORAL_TABLET | Freq: Four times a day (QID) | ORAL | Status: DC | PRN

## 2021-06-23 MED ORDER — LACTATED RINGERS IV SOLN
INTRAVENOUS | Status: DC | PRN
Start: 2021-06-23 — End: 2021-06-23

## 2021-06-23 MED ORDER — ACETAMINOPHEN 500 MG PO TABS
1000.0000 mg | ORAL_TABLET | ORAL | Status: AC
  Administered 2021-06-23: 07:00:00 1000 mg via ORAL

## 2021-06-23 MED ORDER — MAGTRACE LYMPHATIC TRACER
INTRAMUSCULAR | Status: DC | PRN
  Administered 2021-06-23: 09:00:00 2 mL via INTRAMUSCULAR

## 2021-06-23 MED ORDER — CEFAZOLIN SODIUM-DEXTROSE 2-4 GM/100ML-% IV SOLN: 2.0000 g | INTRAVENOUS | Status: DC

## 2021-06-23 MED ORDER — ALBUTEROL SULFATE HFA 108 (90 BASE) MCG/ACT IN AERS: 2.0000 | INHALATION_SPRAY | Freq: Four times a day (QID) | RESPIRATORY_TRACT | Status: DC | PRN

## 2021-06-23 MED ORDER — ACETAMINOPHEN 500 MG PO TABS: 1000.0000 mg | ORAL_TABLET | Freq: Once | ORAL | Status: DC

## 2021-06-23 MED ORDER — METHOCARBAMOL 500 MG PO TABS
500.0000 mg | ORAL_TABLET | Freq: Four times a day (QID) | ORAL | Status: DC | PRN
  Administered 2021-06-23 (×2): 500 mg via ORAL
  Filled 2021-06-23 (×2): qty 1

## 2021-06-23 MED ORDER — DEXAMETHASONE SODIUM PHOSPHATE 10 MG/ML IJ SOLN
INTRAMUSCULAR | Status: DC | PRN
Start: 2021-06-23 — End: 2021-06-23
  Administered 2021-06-23: 5 mg via INTRAVENOUS

## 2021-06-23 MED ORDER — EPHEDRINE SULFATE (PRESSORS) 50 MG/ML IJ SOLN
INTRAMUSCULAR | Status: DC | PRN
Start: 2021-06-23 — End: 2021-06-23
  Administered 2021-06-23 (×3): 10 mg via INTRAVENOUS

## 2021-06-23 MED ORDER — ONDANSETRON HCL 4 MG/2ML IJ SOLN: 4.0000 mg | Freq: Four times a day (QID) | INTRAMUSCULAR | Status: DC | PRN

## 2021-06-23 MED ORDER — FENTANYL CITRATE (PF) 100 MCG/2ML IJ SOLN
INTRAMUSCULAR | Status: DC | PRN
  Administered 2021-06-23 (×10): 25 ug via INTRAVENOUS

## 2021-06-23 MED ORDER — BUPIVACAINE-EPINEPHRINE (PF) 0.25% -1:200000 IJ SOLN
INTRAMUSCULAR | Status: AC
  Filled 2021-06-23: qty 30

## 2021-06-23 MED ORDER — OXYCODONE HCL 5 MG/5ML PO SOLN: 5.0000 mg | Freq: Once | ORAL | Status: DC | PRN

## 2021-06-23 MED ORDER — HEPARIN SOD (PORK) LOCK FLUSH 100 UNIT/ML IV SOLN
INTRAVENOUS | Status: DC | PRN
Start: 2021-06-23 — End: 2021-06-23
  Administered 2021-06-23: 500 [IU]

## 2021-06-23 MED ORDER — LIDOCAINE HCL (PF) 1 % IJ SOLN
INTRAMUSCULAR | Status: AC
  Filled 2021-06-23: qty 30

## 2021-06-23 MED ORDER — ONDANSETRON HCL 4 MG/2ML IJ SOLN
INTRAMUSCULAR | Status: AC
  Filled 2021-06-23: qty 2

## 2021-06-23 MED ORDER — FENTANYL CITRATE (PF) 100 MCG/2ML IJ SOLN
INTRAMUSCULAR | Status: AC
Start: 2021-06-23 — End: ?
  Filled 2021-06-23: qty 2

## 2021-06-23 MED ORDER — CELECOXIB 100 MG PO CAPS
100.0000 mg | ORAL_CAPSULE | Freq: Two times a day (BID) | ORAL | Status: DC
  Administered 2021-06-23: 21:00:00 100 mg via ORAL

## 2021-06-23 MED ORDER — MONTELUKAST SODIUM 10 MG PO TABS: 10.0000 mg | ORAL_TABLET | Freq: Every day | ORAL | Status: DC

## 2021-06-23 MED ORDER — TRAMADOL HCL 50 MG PO TABS: 50.0000 mg | ORAL_TABLET | Freq: Four times a day (QID) | ORAL | 1 refills | Status: DC | PRN

## 2021-06-23 MED ORDER — PROPOFOL 10 MG/ML IV BOLUS
INTRAVENOUS | Status: AC
Start: 2021-06-23 — End: ?
  Filled 2021-06-23: qty 20

## 2021-06-23 MED ORDER — CEFAZOLIN SODIUM-DEXTROSE 2-4 GM/100ML-% IV SOLN
2.0000 g | Freq: Three times a day (TID) | INTRAVENOUS | Status: AC
  Administered 2021-06-23: 17:00:00 2 g via INTRAVENOUS
  Filled 2021-06-23: qty 100

## 2021-06-23 MED ORDER — TRAMADOL HCL 50 MG PO TABS: 50.0000 mg | ORAL_TABLET | Freq: Four times a day (QID) | ORAL | Status: DC | PRN

## 2021-06-23 MED ORDER — HYDROCODONE-ACETAMINOPHEN 5-325 MG PO TABS
1.0000 | ORAL_TABLET | ORAL | Status: DC | PRN
  Administered 2021-06-23: 17:00:00 1 via ORAL
  Filled 2021-06-23: qty 1

## 2021-06-23 MED ORDER — LIDOCAINE 2% (20 MG/ML) 5 ML SYRINGE
INTRAMUSCULAR | Status: AC
  Filled 2021-06-23: qty 5

## 2021-06-23 MED ORDER — DOCUSATE SODIUM 100 MG PO CAPS: 100.0000 mg | ORAL_CAPSULE | Freq: Two times a day (BID) | ORAL | 0 refills | Status: DC

## 2021-06-23 MED ORDER — AMISULPRIDE (ANTIEMETIC) 5 MG/2ML IV SOLN: 10.0000 mg | Freq: Once | INTRAVENOUS | Status: DC | PRN

## 2021-06-23 MED ORDER — CELECOXIB 100 MG PO CAPS
ORAL_CAPSULE | ORAL | Status: AC
  Filled 2021-06-23: qty 1

## 2021-06-23 SURGICAL SUPPLY — 63 items
ADH SKN CLS APL DERMABOND .7 (GAUZE/BANDAGES/DRESSINGS) ×2
APL PRP STRL LF DISP 70% ISPRP (MISCELLANEOUS) ×4
BAG DECANTER FOR FLEXI CONT (MISCELLANEOUS) ×4 IMPLANT
BINDER BREAST XXLRG (GAUZE/BANDAGES/DRESSINGS) ×1 IMPLANT
BIOPATCH RED 1 DISK 7.0 (GAUZE/BANDAGES/DRESSINGS) ×1 IMPLANT
BLADE SURG 10 STRL SS (BLADE) ×4 IMPLANT
BLADE SURG 11 STRL SS (BLADE) ×4 IMPLANT
BLADE SURG 15 STRL LF DISP TIS (BLADE) ×3 IMPLANT
BLADE SURG 15 STRL SS (BLADE) ×3
CANISTER SUCT 1200ML W/VALVE (MISCELLANEOUS) ×4 IMPLANT
CHLORAPREP W/TINT 26 (MISCELLANEOUS) ×5 IMPLANT
CLIP TI MEDIUM 6 (CLIP) ×8 IMPLANT
COVER BACK TABLE 60X90IN (DRAPES) ×4 IMPLANT
COVER MAYO STAND STRL (DRAPES) ×4 IMPLANT
COVER PROBE W GEL 5X96 (DRAPES) ×4 IMPLANT
DERMABOND ADVANCED (GAUZE/BANDAGES/DRESSINGS) ×1
DERMABOND ADVANCED .7 DNX12 (GAUZE/BANDAGES/DRESSINGS) ×3 IMPLANT
DRAIN CHANNEL 19F RND (DRAIN) ×4 IMPLANT
DRAPE C-ARM 42X72 X-RAY (DRAPES) ×4 IMPLANT
DRAPE UTILITY XL STRL (DRAPES) ×4 IMPLANT
DRSG PAD ABDOMINAL 8X10 ST (GAUZE/BANDAGES/DRESSINGS) ×8 IMPLANT
DRSG TEGADERM 4X4.75 (GAUZE/BANDAGES/DRESSINGS) ×1 IMPLANT
ELECT BLADE 4.0 EZ CLEAN MEGAD (MISCELLANEOUS) ×3
ELECT REM PT RETURN 9FT ADLT (ELECTROSURGICAL) ×3
ELECTRODE BLDE 4.0 EZ CLN MEGD (MISCELLANEOUS) IMPLANT
ELECTRODE REM PT RTRN 9FT ADLT (ELECTROSURGICAL) ×3 IMPLANT
EVACUATOR SILICONE 100CC (DRAIN) ×4 IMPLANT
GAUZE 4X4 16PLY ~~LOC~~+RFID DBL (SPONGE) ×4 IMPLANT
GAUZE SPONGE 4X4 12PLY STRL (GAUZE/BANDAGES/DRESSINGS) ×4 IMPLANT
GLOVE SURG ENC MOIS LTX SZ6 (GLOVE) ×4 IMPLANT
GLOVE SURG POLYISO LF SZ6.5 (GLOVE) ×1 IMPLANT
GLOVE SURG POLYISO LF SZ7 (GLOVE) ×2 IMPLANT
GLOVE SURG UNDER POLY LF SZ6.5 (GLOVE) ×5 IMPLANT
GLOVE SURG UNDER POLY LF SZ7 (GLOVE) ×2 IMPLANT
GOWN STRL REUS W/ TWL LRG LVL3 (GOWN DISPOSABLE) ×3 IMPLANT
GOWN STRL REUS W/TWL 2XL LVL3 (GOWN DISPOSABLE) ×4 IMPLANT
GOWN STRL REUS W/TWL LRG LVL3 (GOWN DISPOSABLE) ×9
KIT PORT POWER 8FR ISP CVUE (Port) ×1 IMPLANT
NDL HYPO 25X1 1.5 SAFETY (NEEDLE) ×3 IMPLANT
NDL SAFETY ECLIPSE 18X1.5 (NEEDLE) ×3 IMPLANT
NEEDLE HYPO 18GX1.5 SHARP (NEEDLE) ×3
NEEDLE HYPO 25X1 1.5 SAFETY (NEEDLE) ×6 IMPLANT
NS IRRIG 1000ML POUR BTL (IV SOLUTION) ×4 IMPLANT
PACK BASIN DAY SURGERY FS (CUSTOM PROCEDURE TRAY) ×4 IMPLANT
PACK UNIVERSAL I (CUSTOM PROCEDURE TRAY) ×4 IMPLANT
PENCIL SMOKE EVACUATOR (MISCELLANEOUS) ×4 IMPLANT
PIN SAFETY STERILE (MISCELLANEOUS) ×4 IMPLANT
SLEEVE SCD COMPRESS KNEE MED (STOCKING) ×4 IMPLANT
SPONGE T-LAP 18X18 ~~LOC~~+RFID (SPONGE) ×5 IMPLANT
STOCKINETTE IMPERVIOUS LG (DRAPES) ×1 IMPLANT
SUT ETHILON 2 0 FS 18 (SUTURE) ×1 IMPLANT
SUT MNCRL AB 4-0 PS2 18 (SUTURE) ×14 IMPLANT
SUT PROLENE 2 0 SH DA (SUTURE) ×8 IMPLANT
SUT SILK 2 0 SH (SUTURE) ×1 IMPLANT
SUT VICRYL 3-0 CR8 SH (SUTURE) ×10 IMPLANT
SYR 10ML LL (SYRINGE) ×4 IMPLANT
SYR 5ML LUER SLIP (SYRINGE) ×4 IMPLANT
SYR CONTROL 10ML LL (SYRINGE) ×4 IMPLANT
TOWEL GREEN STERILE FF (TOWEL DISPOSABLE) ×4 IMPLANT
TRACER MAGTRACE VIAL (MISCELLANEOUS) ×1 IMPLANT
TUBE CONNECTING 20X1/4 (TUBING) ×4 IMPLANT
UNDERPAD 30X36 HEAVY ABSORB (UNDERPADS AND DIAPERS) ×4 IMPLANT
YANKAUER SUCT BULB TIP NO VENT (SUCTIONS) ×4 IMPLANT

## 2021-06-23 NOTE — Anesthesia Procedure Notes (Addendum)
?  Anesthesia Regional Block: Pectoralis block  ? ?Pre-Anesthetic Checklist: , timeout performed,  Correct Patient, Correct Site, Correct Laterality,  Correct Procedure, Correct Position, site marked,  Risks and benefits discussed,  Surgical consent,  Pre-op evaluation,  At surgeon's request and post-op pain management ? ?Laterality: Right ? ?Prep: chloraprep     ?  ?Needles:  ?Injection technique: Single-shot ? ?Needle Type: Echogenic Stimulator Needle   ? ? ?Needle Length: 10cm  ?Needle Gauge: 20  ? ? ? ?Additional Needles: ? ? ?Procedures:,,,, ultrasound used (permanent image in chart),,    ?Narrative:  ?Start time: 06/23/2021 7:00 AM ?End time: 06/23/2021 7:05 AM ?Injection made incrementally with aspirations every 5 mL. ? ?Performed by: Personally  ?Anesthesiologist: Merlinda Frederick, MD ? ?Additional Notes: ?Functioning IV was confirmed and monitors were applied.  Sterile prep and drape,hand hygiene and sterile gloves were used. Ultrasound guidance: relevant anatomy identified, needle position confirmed, local anesthetic spread visualized around nerve(s)., vascular puncture avoided. Negative aspiration and negative test dose prior to incremental administration of local anesthetic. The patient tolerated the procedure well. ? ? ? ? ? ?

## 2021-06-23 NOTE — Transfer of Care (Signed)
Immediate Anesthesia Transfer of Care Note ? ?Patient: Ann Maxwell ? ?Procedure(s) Performed: RIGHT MASTECTOMY (Right: Breast) ?SENTINEL LYMPH NODE BIOPSY (Right: Breast) ?INSERTION PORT-A-CATH (Left: Chest) ? ?Patient Location: PACU ? ?Anesthesia Type:General and Regional ? ?Level of Consciousness: awake, alert  and oriented ? ?Airway & Oxygen Therapy: Patient Spontanous Breathing and Patient connected to face mask oxygen ? ?Post-op Assessment: Report given to RN and Post -op Vital signs reviewed and stable ? ?Post vital signs: Reviewed and stable ? ?Last Vitals:  ?Vitals Value Taken Time  ?BP 133/68 06/23/21 1030  ?Temp    ?Pulse 101 06/23/21 1030  ?Resp    ?SpO2 98 % 06/23/21 1030  ?Vitals shown include unvalidated device data. ? ?Last Pain:  ?Vitals:  ? 06/23/21 0639  ?TempSrc: Oral  ?PainSc: 4   ?   ? ?Patients Stated Pain Goal: 6 (06/23/21 3779) ? ?Complications: No notable events documented. ?

## 2021-06-23 NOTE — Discharge Instructions (Addendum)
[facility] [phone]  MASTECTOMY: POST OP INSTRUCTIONS  Always review your discharge instruction sheet given to you by the facility where your surgery was performed. IF YOU HAVE DISABILITY OR FAMILY LEAVE FORMS, YOU MUST BRING THEM TO THE OFFICE FOR PROCESSING.   DO NOT GIVE THEM TO YOUR DOCTOR. A prescription for pain medication may be given to you upon discharge.  Take your pain medication as prescribed, if needed.  If narcotic pain medicine is not needed, then you may take acetaminophen (Tylenol) or ibuprofen (Advil) as needed. Take your usually prescribed medications unless otherwise directed. If you need a refill on your pain medication, please contact your pharmacy.  They will contact our office to request authorization.  Prescriptions will not be filled after 5pm or on week-ends. You should follow a light diet the first few days after arrival home, such as soup and crackers, etc.  Resume your normal diet the day after surgery. Most patients will experience some swelling and bruising on the chest and underarm.  Ice packs will help.  Swelling and bruising can take several days to resolve.  It is common to experience some constipation if taking pain medication after surgery.  Increasing fluid intake and taking a stool softener (such as Colace) will usually help or prevent this problem from occurring.  A mild laxative (Milk of Magnesia or Miralax) should be taken according to package instructions if there are no bowel movements after 48 hours. Unless discharge instructions indicate otherwise, leave your bandage dry and in place until your next appointment in 3-5 days.  You may take a limited sponge bath.  No tube baths or showers until the drains are removed.  You may have steri-strips (small skin tapes) in place directly over the incision.  These strips should be left on the skin for 7-10 days.  If your surgeon used skin glue on the incision, you may shower in 24 hours.   The glue will flake off over the next 2-3 weeks.  Any sutures or staples will be removed at the office during your follow-up visit. DRAINS:  If you have drains in place, it is important to keep a list of the amount of drainage produced each day in your drains.  Before leaving the hospital, you should be instructed on drain care.  Call our office if you have any questions about your drains. ACTIVITIES:  You may resume regular (light) daily activities beginning the next day--such as daily self-care, walking, climbing stairs--gradually increasing activities as tolerated.  You may have sexual intercourse when it is comfortable.  Refrain from any heavy lifting or straining until approved by your doctor. You may drive when you are no longer taking prescription pain medication, you can comfortably wear a seatbelt, and you can safely maneuver your car and apply brakes. RETURN TO WORK:  __________________________________________________________ You should see your doctor in the office for a follow-up appointment approximately 3-5 days after your surgery.  Your doctor's nurse will typically make your follow-up appointment when she calls you with your pathology report.  Expect your pathology report 2-3 business days after your surgery.  You may call to check if you do not hear from us after three days.   OTHER INSTRUCTIONS: ______________________________________________________________________________________________ ____________________________________________________________________________________________ WHEN TO CALL YOUR DOCTOR: Fever over 101.0 Nausea and/or vomiting Extreme swelling or bruising Continued bleeding from incision. Increased pain, redness, or drainage from the incision. The clinic staff is available to answer your questions during regular business hours.  Please don't hesitate   to call and ask to speak to one of the nurses for clinical concerns.  If you have a medical emergency, go to the  nearest emergency room or call 911.  A surgeon from [facility] is always on call at the hospital. ?[address], [place], [place], [place]  [address] ? [address], [place], [place]   [address] ?[phone] ? [phone] ? FAX [phone] ?Web site: [facility] ?Information for Discharge Teaching: ?EXPAREL (bupivacaine liposome injectable suspension)  ? ?Your surgeon or anesthesiologist gave you EXPAREL(bupivacaine) to help control your pain after surgery.  ?EXPAREL is a local anesthetic that provides pain relief by numbing the tissue around the surgical site. ?EXPAREL is designed to release pain medication over time and can control pain for up to 72 hours. ?Depending on how you respond to EXPAREL, you may require less pain medication during your recovery. ? ?Possible side effects: ?Temporary loss of sensation or ability to move in the area where bupivacaine was injected. ?Nausea, vomiting, constipation ?Rarely, numbness and tingling in your mouth or lips, lightheadedness, or anxiety may occur. ?Call your doctor right away if you think you may be experiencing any of these sensations, or if you have other questions regarding possible side effects. ? ?Follow all other discharge instructions given to you by your surgeon or nurse. Eat a healthy diet and drink plenty of water or other fluids. ? ?If you return to the hospital for any reason within 96 hours following the administration of EXPAREL, it is important for health care providers to know that you have received this anesthetic. A teal colored band has been placed on your arm with the date, time and amount of EXPAREL you have received in order to alert and inform your health care providers. Please leave this armband in place for the full 96 hours following administration, and then you may remove the band.  ? ?About my Jackson-Pratt Bulb Drain ? ?What is a Jackson-Pratt bulb? ?A Jackson-Pratt is a soft, round device used to collect drainage.  It is connected to a long, thin drainage catheter, which is held in place by one or two small stiches near your surgical incision site. When the bulb is squeezed, it forms a vacuum, forcing the drainage to empty into the bulb. ? ?Emptying the Jackson-Pratt bulb- ?To empty the bulb: ?1. Release the plug on the top of the bulb. ?2. Pour the bulb's contents into a measuring container which your nurse will provide. ?3. Record the time emptied and amount of drainage. Empty the drain(s) as often as your     doctor or nurse recommends. ? ?Date                  Time                    Amount (Drain 1)                 Amount (Drain 2) ? ?_____________________________________________________________________ ? ?_____________________________________________________________________ ? ?_____________________________________________________________________ ? ?_____________________________________________________________________ ? ?_____________________________________________________________________ ? ?_____________________________________________________________________ ? ?_____________________________________________________________________ ? ?_____________________________________________________________________ ? ?Squeezing the Jackson-Pratt Bulb- ?To squeeze the bulb: ?1. Make sure the plug at the top of the bulb is open. ?2. Squeeze the bulb tightly in your fist. You will hear air squeezing from the bulb. ?3. Replace the plug while the bulb is squeezed. ?4. Use a safety pin to attach the bulb to your clothing. This will keep the catheter from     pulling at the bulb insertion site. ? ?  When to call your doctor- ?Call your doctor if: ?Drain site becomes red, swollen or hot. ?You have a fever greater than 101 degrees F. ?There is oozing at the drain site. ?Drain falls out (apply a guaze bandage over the drain hole and secure it with tape). ?Drainage increases daily not related to activity patterns. (You will usually have more  drainage when you are active than when you are resting.) ?Drainage has a bad odor. ?  ?

## 2021-06-23 NOTE — Anesthesia Postprocedure Evaluation (Signed)
Anesthesia Post Note ? ?Patient: Ann Maxwell ? ?Procedure(s) Performed: RIGHT MASTECTOMY (Right: Breast) ?SENTINEL LYMPH NODE BIOPSY (Right: Breast) ?INSERTION PORT-A-CATH (Left: Chest) ? ?  ? ?Patient location during evaluation: PACU ?Anesthesia Type: Regional and General ?Level of consciousness: awake ?Pain management: pain level controlled ?Vital Signs Assessment: post-procedure vital signs reviewed and stable ?Respiratory status: spontaneous breathing and respiratory function stable ?Cardiovascular status: stable ?Postop Assessment: no apparent nausea or vomiting ?Anesthetic complications: no ? ? ?No notable events documented. ? ?Last Vitals:  ?Vitals:  ? 06/23/21 1122 06/23/21 1130  ?BP:  103/69  ?Pulse:  79  ?Resp:  14  ?Temp:  36.5 ?C  ?SpO2: 95% 93%  ?  ?Last Pain:  ?Vitals:  ? 06/23/21 1130  ?TempSrc:   ?PainSc: 3   ? ? ?  ?  ?  ?  ?  ?  ? ?Merlinda Frederick ? ? ? ? ?

## 2021-06-23 NOTE — Interval H&P Note (Signed)
History and Physical Interval Note: ? ?06/23/2021 ?7:38 AM ? ?Ann Maxwell  has presented today for surgery, with the diagnosis of RIGHT BREAST CANCER.  The various methods of treatment have been discussed with the patient and family. After consideration of risks, benefits and other options for treatment, the patient has consented to  Procedure(s): ?RIGHT MASTECTOMY (Right) ?SENTINEL LYMPH NODE BIOPSY (Right) ?INSERTION PORT-A-CATH (N/A) as a surgical intervention.  The patient's history has been reviewed, patient examined, no change in status, stable for surgery.  I have reviewed the patient's chart and labs.  Questions were answered to the patient's satisfaction.   ? ? ?Stark Klein ? ? ?

## 2021-06-23 NOTE — Anesthesia Procedure Notes (Signed)
Procedure Name: LMA Insertion ?Date/Time: 06/23/2021 8:15 AM ?Performed by: Verita Lamb, CRNA ?Pre-anesthesia Checklist: Patient identified, Emergency Drugs available, Suction available and Patient being monitored ?Patient Re-evaluated:Patient Re-evaluated prior to induction ?Oxygen Delivery Method: Circle system utilized ?Preoxygenation: Pre-oxygenation with 100% oxygen ?Induction Type: IV induction ?Ventilation: Mask ventilation without difficulty ?LMA: LMA inserted ?LMA Size: 4.0 ?Number of attempts: 1 ?Airway Equipment and Method: Bite block ?Placement Confirmation: positive ETCO2 ?Tube secured with: Tape ?Dental Injury: Teeth and Oropharynx as per pre-operative assessment  ? ? ? ? ?

## 2021-06-23 NOTE — Progress Notes (Signed)
Assisted Dr. Elgie Congo with right, ultrasound guided, pectoralis block. Side rails up, monitors on throughout procedure. See vital signs in flow sheet. Tolerated Procedure well. ?

## 2021-06-23 NOTE — Op Note (Signed)
Right Mastectomy with Sentinel Node Biopsy Procedure Note, left subclavian port placement ? ?Indications: This patient presents with history of new right breast cancer and remote history of left breast cancer as well as colon cancer ? ?Pre-operative Diagnosis: right breast cancer, cT1cN0M0, central quadrant, receptors -/-/- ? ?Post-operative Diagnosis: same ? ?Surgeon: Stark Klein  ? ?Anesthesia: General endotracheal anesthesia and pectoral block ? ?ASA Class: 3 ? ?Procedure Details  ?The patient was seen in the Holding Room. The risks, benefits, complications, treatment options, and expected outcomes were discussed with the patient. The possibilities of reaction to medication, pulmonary aspiration, bleeding, infection, the need for additional procedures, failure to diagnose a condition, and creating a complication requiring transfusion or operation were discussed with the patient. The patient concurred with the proposed plan, giving informed consent.  The site of surgery properly noted/marked. The patient was taken to Operating Room # 8, identified as Ann Maxwell, and the procedure verified as right Mastectomy and Sentinel Node Biopsy as well as port placement. After induction of anesthesia, the right arm, breast, and bilateral chest were prepped and draped in standard fashion.  A Time Out was held and the above information confirmed.   ? ?The port was addressed first. Local anesthetic was administered over this  ? area at the angle of the clavicle.  The vein was accessed with 2 pass(es) of the needle. There was good venous return and the wire passed easily with no ectopy.  ? Fluoroscopy was used to confirm that the wire was in the vena cava.  ?   ? The patient was placed back level and the area for the pocket was anethetized  ? with local anesthetic.  A 3-cm transverse incision was made with a #15  ? blade.  Cautery was used to divide the subcutaneous tissues down to the  ? pectoralis muscle.  An [employer]  retractor was used to elevate the skin  ? while a pocket was created on top of the pectoralis fascia.  The port  ? was placed into the pocket to confirm that it was of adequate size.  The  ? catheter was preattached to the port.  The port was then secured to the  ? pectoralis fascia with four 2-0 Prolene sutures.  These were clamped and  ? not tied down yet.   ? ?The catheter was tunneled through to the wire exit  ? site.  The catheter was placed along the wire to determine what length it should be to be in the SVC.  The catheter was cut at 28 cm.  The tunneler sheath and dilator were passed over the wire and the dilator and wire were removed.  The catheter was advanced through the tunneler sheath and the tunneler sheath was pulled away.  Care was taken to keep the catheter in the tunneler sheath as this occurred. This was advanced and the tunneler sheath was removed.  There was good venous  ? return and easy flush of the catheter.  The Prolene sutures were tied  ? down to the pectoral fascia.  The skin was reapproximated using 3-0  ? Vicryl interrupted deep dermal sutures.   ? ?Fluoroscopy was used to re-confirm good position of the catheter.  The skin  ? was then closed using 4-0 Monocryl in a subcuticular fashion.  The port was flushed with concentrated heparin flush as well.  The wounds were then cleaned, dried, and dressed with Dermabond.   ? ?The Magtrace was injected in the subareolar location.  The borders of the breast were identified and marked.  The incisions of the breast were drawn out to make sure incision lines were equidistant in length.    The superior incision was made with the #10 blade.  Mastectomy hooks were used to provide elevation of the skin edges, and the cautery was used to create the mastectomy flaps.  The dissection was taken to the fascia of the pectoralis major.  The penetrating vessels were clipped. She had several large veins that required suture ligation.  The superior flap was  taken medially to the lateral sternal border, superiorly to the inferior border of the clavicle.  The inferior flap was similarly created, inferiorly to the inframammary fold and laterally to the border of the latissimus.  The breast was taken off including the pectoralis fascia and the axillary tail marked.   ? ?Using the sentimag probe, axillary sentinel nodes were identified.  Three deep level 2 axillary sentinel nodes were removed and submitted to pathology.  The findings are below.  The lymphovascular channels were clipped with metal clips.     ?   ?The wound was irrigated. One 19 Blake drain was placed laterally.   Hemostasis was achieved with cautery.  The wound was irrigated and closed with a 3-0 Vicryl deep dermal interrupted sutures and 4-0 Vicryl subcuticular closure in layers. ?   ?Sterile dressings were applied. At the end of the operation, all sponge, instrument, and needle counts were correct. ? ?Findings: ?grossly clear surgical margins, SLN #1 cps 2500, SLN #2 cps 50, SLN #3 cps 950 ? ?Estimated Blood Loss: <50 mL  ?        ?Drains: 19 Fr blake drain in right chest wall ?               ?Specimens: right breast and three right axillary sentinel nodes ?        ?Complications:  None; patient tolerated the procedure well. ?        ?Disposition: PACU - hemodynamically stable. ?        ?Condition: stable ? ?

## 2021-06-23 NOTE — Discharge Summary (Signed)
Physician Discharge Summary  ?Patient ID: ?Ernst Spell ?MRN: [id number] ?DOB/AGE: [birth year] 81 y.o. ? ?Admit date: 06/23/2021 ?Discharge date: 06/24/2021 ? ?Admission Diagnoses: ? ?Discharge Diagnoses:  ?Principal Problem: ?  Cancer of central portion of right breast ([place]) ? ? ?Discharged Condition: stable ? ?Hospital Course:  ?Pt was admitted following a right mastectomy/SLN bx and left [place] port placement 06/23/2021.  Post op chest xray looked good.  She did well overnight.  She and her daughter were able to take care of the drain.  She did not have evidence of bleeding and vitals were stable.  She was ambulatory.  She was discharged in stable condition.  ? ?Consults: None ? ?Significant Diagnostic Studies: none ? ?Treatments: surgery: see above. ? ?Discharge Exam: ?Blood pressure (!) 148/77, pulse 79, temperature 98.3 ?F (36.8 ?C), resp. rate 16, height '5\' 4"'$  (1.626 m), weight 91.9 kg, SpO2 94 %. ?General appearance: alert and no distress ?Chest wall: anticipated right chest wall tenderness.  ? ?Disposition: Discharge disposition: 01-Home or Self Care ? ? ? ? ? ? ?Discharge Instructions   ? ? Call MD for:  difficulty breathing, headache or visual disturbances   Complete by: As directed ?  ? Call MD for:  hives   Complete by: As directed ?  ? Call MD for:  persistant nausea and vomiting   Complete by: As directed ?  ? Call MD for:  redness, tenderness, or signs of infection (pain, swelling, redness, odor or green/yellow discharge around incision site)   Complete by: As directed ?  ? Call MD for:  severe uncontrolled pain   Complete by: As directed ?  ? Call MD for:  temperature >100.4   Complete by: As directed ?  ? Change dressing (specify)   Complete by: As directed ?  ? Measure and record drain output at least twice daily.  Bring record to clinic.  ? Diet - low sodium heart healthy   Complete by: As directed ?  ? Increase activity slowly   Complete by: As directed ?  ? ?  ? ?Allergies as of 06/24/2021   ? ?    Reactions  ? Other Shortness Of Breath, Other (See Comments)  ? Tree and shrub pollen   ? Tape Other (See Comments), Rash  ? Surgical tape causes blisters  ? Tyloxapol Nausea And Vomiting  ? Tylox  ? Terramycin [oxytetracycline] Rash  ? ?  ? ?  ?Medication List  ?  ? ?TAKE these medications   ? ?Advair Diskus 250-50 MCG/ACT Aepb ?Generic drug: fluticasone-salmeterol ?Inhale 1 puff into the lungs 2 (two) times daily. ?  ?albuterol 108 (90 Base) MCG/ACT inhaler ?Commonly known as: VENTOLIN HFA ?Inhale 2 puffs into the lungs every 6 (six) hours as needed for wheezing or shortness of breath. ?  ?alendronate 70 MG tablet ?Commonly known as: Fosamax ?Take 1 tablet (70 mg total) by mouth once a week. Take with a full glass of water on an empty stomach. ?  ?celecoxib 100 MG capsule ?Commonly known as: CeleBREX ?Take 1 capsule (100 mg total) by mouth 2 (two) times daily. ?  ?docusate sodium 100 MG capsule ?Commonly known as: COLACE ?Take 1 capsule (100 mg total) by mouth 2 (two) times daily. ?  ?gabapentin 100 MG capsule ?Commonly known as: Neurontin ?Take 1 capsule (100 mg total) by mouth 3 (three) times daily. ?What changed: when to take this ?  ?hydrochlorothiazide 12.5 MG capsule ?Commonly known as: MICROZIDE ?Take 12.5 mg by mouth daily. ?  ?  HYDROcodone-acetaminophen 5-325 MG tablet ?Commonly known as: NORCO/VICODIN ?Take 1-2 tablets by mouth every 4 (four) hours as needed for moderate pain. ?  ?hydrocortisone 2.5 % rectal cream ?Commonly known as: ANUSOL-HC ?Place 1 application rectally 3 (three) times daily as needed for hemorrhoids or itching. ?  ?loratadine 10 MG tablet ?Commonly known as: CLARITIN ?Take 10 mg by mouth daily as needed for allergies. ?  ?losartan 50 MG tablet ?Commonly known as: COZAAR ?Take 50 mg by mouth every evening. ?  ?melatonin 5 MG Tabs ?Take 5 mg by mouth at bedtime as needed (sleep). ?  ?methocarbamol 500 MG tablet ?Commonly known as: ROBAXIN ?Take 1 tablet (500 mg total) by mouth every 6  (six) hours as needed for muscle spasms. ?  ?montelukast 10 MG tablet ?Commonly known as: SINGULAIR ?Take 10 mg by mouth at bedtime. ?  ?polyvinyl alcohol 1.4 % ophthalmic solution ?Commonly known as: LIQUIFILM TEARS ?Place 2 drops into both eyes as needed for dry eyes. ?  ?traMADol 50 MG tablet ?Commonly known as: ULTRAM ?Take 1 tablet (50 mg total) by mouth every 6 (six) hours as needed (mild pain). ?  ?Vitamin D (Ergocalciferol) 1.25 MG (50000 UNIT) Caps capsule ?Commonly known as: DRISDOL ?Take 50,000 Units by mouth every 7 (seven) days. ?  ? ?  ? ?  ?  ? ? ?  ?Discharge Care Instructions  ?(From admission, onward)  ?  ? ? ?  ? ?  Start     Ordered  ? 06/23/21 0000  Change dressing (specify)       ?Comments: Measure and record drain output at least twice daily.  Bring record to clinic.  ? 06/23/21 1424  ? ?  ?  ? ?  ? ? Follow-up Information   ? ? Stark Klein, MD Follow up on 07/04/2021.   ?Specialty: General Surgery ?Contact information: ?[address] ?[address] ?[place] [address] ?[phone] ? ? ?  ?  ? ?  ?  ? ?  ? ? ?Signed: ?Stark Klein ?06/24/2021, 8:48 AM ? ? ?

## 2021-06-24 ENCOUNTER — Encounter (HOSPITAL_BASED_OUTPATIENT_CLINIC_OR_DEPARTMENT_OTHER): Payer: Self-pay | Admitting: General Surgery

## 2021-06-27 LAB — SURGICAL PATHOLOGY

## 2021-06-28 ENCOUNTER — Telehealth: Payer: Self-pay | Admitting: General Surgery

## 2021-06-28 NOTE — Telephone Encounter (Signed)
Discussed pathology with patient

## 2021-06-29 NOTE — Progress Notes (Signed)
? ?Patient Care Team: ?Donnajean Lopes, MD as PCP - General (Internal Medicine) ?Mauro Kaufmann, RN as Oncology Nurse Navigator ?Rockwell Germany, RN as Oncology Nurse Navigator ? ?DIAGNOSIS:  ?Encounter Diagnosis  ?Name Primary?  ? Primary cancer of lower outer quadrant of left female breast ([place])   ? ? ?SUMMARY OF ONCOLOGIC HISTORY: ?Oncology History  ?Primary cancer of lower outer quadrant of left female breast ([place])  ?01/04/2012 Surgery  ? Left mastectomy: Invasive ductal carcinoma 0.5 cm with high-grade DCIS 9 cm, margins negative, 0/6 lymph nodes, ER 100%, PR 100%, Ki-67 17%, HER-2 negative ratio 1.16 ?  ?02/07/2012 - 05/28/2016 Anti-estrogen oral therapy  ? Anastrozole 1 mg daily (stopped early due to Osteoporosis) ?  ?06/23/2021 Surgery  ? Right mastectomy: Grade 3 IDC with DCIS, 3.4 cm, margins negative, 2/3 lymph nodes positive ER 0%, PR 0%, HER2 negative, Ki-67 20% ?  ?Primary colon cancer ([place])  ?03/20/2014 Initial Diagnosis  ? Invasive adenocarcinoma of [place] ?  ?04/28/2014 Surgery  ? Right hemicolectomy: Invasive adenocarcinoma with mucinous features moderately differentiated invading subserosal tissue, margins negative, 25 lymph nodes negative, T3 N0 M0 stage II a, no microsatellite instability ?  ?Malignant neoplasm of central portion of right breast in female, estrogen receptor negative ([place])  ?06/01/2021 Initial Diagnosis  ? Malignant neoplasm of central portion of right breast in female, estrogen receptor negative ([place]) ?  ?06/01/2021 Cancer Staging  ? Staging form: Breast, AJCC 8th Edition ?- Clinical: Stage IB (cT1c, cN0, cM0, G3, ER-, PR-, HER2-) - Signed by Nicholas Lose, MD on 06/01/2021 ?Histologic grading system: 3 grade system ? ?  ? ?CHIEF COMPLIANT: Follow-up and surveillance of breast and colon cancers ? ? ?INTERVAL HISTORY: KYERRA VARGO is a 81 y.o. with above-mentioned history of left breast cancer who is currently on surveillance after completion of adjuvant antiestrogen therapy with  anastrozole. She also has a history of colon cancer diagnosed in 2015 and had surgery in January 2016. She presents to the clinic today for follow-up.  She still has drains in place but she is healing and recovering from recent surgery. ? ? ?ALLERGIES:  is allergic to other, tape, tyloxapol, and terramycin [oxytetracycline]. ? ?MEDICATIONS:  ?Current Outpatient Medications  ?Medication Sig Dispense Refill  ? ADVAIR DISKUS 250-50 MCG/DOSE AEPB Inhale 1 puff into the lungs 2 (two) times daily. 180 each 0  ? albuterol (VENTOLIN HFA) 108 (90 Base) MCG/ACT inhaler Inhale 2 puffs into the lungs every 6 (six) hours as needed for wheezing or shortness of breath.    ? alendronate (FOSAMAX) 70 MG tablet Take 1 tablet (70 mg total) by mouth once a week. Take with a full glass of water on an empty stomach.    ? celecoxib (CELEBREX) 100 MG capsule Take 1 capsule (100 mg total) by mouth 2 (two) times daily.    ? docusate sodium (COLACE) 100 MG capsule Take 1 capsule (100 mg total) by mouth 2 (two) times daily. 10 capsule 0  ? gabapentin (NEURONTIN) 100 MG capsule Take 1 capsule (100 mg total) by mouth 3 (three) times daily. (Patient taking differently: Take 100 mg by mouth at bedtime.)    ? hydrochlorothiazide (MICROZIDE) 12.5 MG capsule Take 12.5 mg by mouth daily.    ? HYDROcodone-acetaminophen (NORCO/VICODIN) 5-325 MG tablet Take 1-2 tablets by mouth every 4 (four) hours as needed for moderate pain. 10 tablet 0  ? hydrocortisone (ANUSOL-HC) 2.5 % rectal cream Place 1 application rectally 3 (three) times daily as needed  for hemorrhoids or itching.     ? loratadine (CLARITIN) 10 MG tablet Take 10 mg by mouth daily as needed for allergies.    ? losartan (COZAAR) 50 MG tablet Take 50 mg by mouth every evening.    ? melatonin 5 MG TABS Take 5 mg by mouth at bedtime as needed (sleep).    ? methocarbamol (ROBAXIN) 500 MG tablet Take 1 tablet (500 mg total) by mouth every 6 (six) hours as needed for muscle spasms. 20 tablet 1  ?  montelukast (SINGULAIR) 10 MG tablet Take 10 mg by mouth at bedtime.    ? polyvinyl alcohol (LIQUIFILM TEARS) 1.4 % ophthalmic solution Place 2 drops into both eyes as needed for dry eyes.    ? traMADol (ULTRAM) 50 MG tablet Take 1 tablet (50 mg total) by mouth every 6 (six) hours as needed (mild pain). 30 tablet 1  ? Vitamin D, Ergocalciferol, (DRISDOL) 1.25 MG (50000 UNIT) CAPS capsule Take 50,000 Units by mouth every 7 (seven) days.    ? ?No current facility-administered medications for this visit.  ? ? ?PHYSICAL EXAMINATION: ?ECOG PERFORMANCE STATUS: 1 - Symptomatic but completely ambulatory ? ?Vitals:  ? 07/04/21 1533  ?Pulse: 95  ?Resp: 18  ?Temp: (!) 97.5 ?F (36.4 ?C)  ?SpO2: 96%  ? ?Filed Weights  ? 07/04/21 1533  ?Weight: 202 lb 1.6 oz (91.7 kg)  ? ? ?LABORATORY DATA:  ?I have reviewed the data as listed ?CMP Latest Ref Rng & Units 06/16/2021 05/11/2021 06/21/2020  ?Glucose 70 - 99 mg/dL 120(H) 95 94  ?BUN 8 - 23 mg/dL _0 ?Creatinine 0.44 - 1.00 mg/dL 0.67 0.66 0.76  ?Sodium 135 - 145 mmol/L 138 136 142  ?Potassium 3.5 - 5.1 mmol/L 4.5 3.7 3.6  ?Chloride 98 - 111 mmol/L 103 101 104  ?CO2 22 - 32 mmol/L _1 ?Calcium 8.9 - 10.3 mg/dL 9.0 9.5 9.4  ?Total Protein 6.5 - 8.1 g/dL - 7.2 7.3  ?Total Bilirubin 0.3 - 1.2 mg/dL - 0.6 0.4  ?Alkaline Phos 38 - 126 U/L - 65 72  ?AST 15 - 41 U/L - 16 16  ?ALT 0 - 44 U/L - 18 21  ? ? ?Lab Results  ?Component Value Date  ? WBC 8.0 05/11/2021  ? HGB 15.4 (H) 05/11/2021  ? HCT 46.8 (H) 05/11/2021  ? MCV 90.7 05/11/2021  ? PLT 271 05/11/2021  ? NEUTROABS 7.0 06/21/2020  ? ? ?ASSESSMENT & PLAN:  ?Primary cancer of lower outer quadrant of left female breast ([place]) ?Left breast cancer invasive ductal carcinoma: 0.5 cm with 9 mm area of DCIS status post left mastectomy 01/04/2012 ER/PR positive HER-2 negative Ki-67 17% currently on Arimidex 1 mg daily  Since 02/07/2012.  Completed 7 years of October 2020 ? (cecal adenocarcinoma diagnosed 04/22/2013, preop CEA of 5.1, status  post right hemicolectomy 04/28/2014, grade 2, no MVI, 0/25 lymph nodes, subserosal involvement, T3 N0 M0 stage II A, no mismatch repair) ? ?Contralateral breast cancer: ?05/19/2021: Right breast mammogram and ultrasound: 1.6 cm mass in the right breast 12 o'clock position with architectural distortion highly suspicious for malignancy, no axillary lymph nodes, biopsy: Grade 3 IDC ER 0%, PR 0%, HER2 negative 1+ by IHC, Ki-67 20%  ? ?06/23/2021:Right mastectomy: Grade 3 IDC with DCIS, 3.4 cm, margins negative, 2/3 lymph nodes positive ER 0%, PR 0%, HER2 negative, Ki-67 20% ?   ?CT CAP and bone scan: 06/08/2021: Multiple small lung nodules stable with the exception  of new 4 mm left lower lobe lung nodule and a 6 mm right lower lobe lung nodule open (nonspecific) ? ?Treatment plan: ?Adjuvant chemotherapy with CMF x6 cycles ?Adjuvant radiation ? ?Chemo counseling: Discussed with her extensively the risks and benefits of chemotherapy include the risk of infection, cytopenias, nausea, hair thinning, fatigue, LFT changes etc.  Patient is agreeable to start chemo. ? ?Return to clinic in 3 to 4 weeks to start chemotherapy ? ? ?No orders of the defined types were placed in this encounter. ? ?The patient has a good understanding of the overall plan. she agrees with it. she will call with any problems that may develop before the next visit here. ?Total time spent: 30 mins including face to face time and time spent for planning, charting and co-ordination of care ? ? Harriette Ohara, MD ?07/04/21 ? ? ? I, Gardiner Coins, am acting as a scribe for Dr. Lindi Adie  ?

## 2021-06-30 ENCOUNTER — Ambulatory Visit: Payer: Medicare PPO | Admitting: Hematology and Oncology

## 2021-06-30 ENCOUNTER — Encounter: Payer: Medicare PPO | Admitting: Genetic Counselor

## 2021-06-30 ENCOUNTER — Other Ambulatory Visit: Payer: Medicare PPO

## 2021-06-30 ENCOUNTER — Encounter: Payer: Self-pay | Admitting: *Deleted

## 2021-07-04 ENCOUNTER — Other Ambulatory Visit: Payer: Self-pay

## 2021-07-04 ENCOUNTER — Inpatient Hospital Stay: Payer: Medicare PPO | Attending: Hematology and Oncology | Admitting: Hematology and Oncology

## 2021-07-04 VITALS — HR 95 | Temp 97.5°F | Resp 18 | Ht 64.0 in | Wt 202.1 lb

## 2021-07-04 DIAGNOSIS — C50512 Malignant neoplasm of lower-outer quadrant of left female breast: Secondary | ICD-10-CM | POA: Diagnosis not present

## 2021-07-04 DIAGNOSIS — M818 Other osteoporosis without current pathological fracture: Secondary | ICD-10-CM | POA: Insufficient documentation

## 2021-07-04 DIAGNOSIS — C50111 Malignant neoplasm of central portion of right female breast: Secondary | ICD-10-CM | POA: Diagnosis not present

## 2021-07-04 DIAGNOSIS — Z85038 Personal history of other malignant neoplasm of large intestine: Secondary | ICD-10-CM | POA: Insufficient documentation

## 2021-07-04 DIAGNOSIS — Z171 Estrogen receptor negative status [ER-]: Secondary | ICD-10-CM | POA: Diagnosis not present

## 2021-07-04 DIAGNOSIS — R918 Other nonspecific abnormal finding of lung field: Secondary | ICD-10-CM | POA: Insufficient documentation

## 2021-07-04 DIAGNOSIS — Z79899 Other long term (current) drug therapy: Secondary | ICD-10-CM | POA: Insufficient documentation

## 2021-07-04 MED ORDER — LIDOCAINE-PRILOCAINE 2.5-2.5 % EX CREA: TOPICAL_CREAM | CUTANEOUS | 3 refills | Status: DC

## 2021-07-04 MED ORDER — PROCHLORPERAZINE MALEATE 10 MG PO TABS: 10.0000 mg | ORAL_TABLET | Freq: Four times a day (QID) | ORAL | 1 refills | Status: DC | PRN

## 2021-07-04 MED ORDER — ONDANSETRON HCL 8 MG PO TABS: 8.0000 mg | ORAL_TABLET | Freq: Two times a day (BID) | ORAL | 1 refills | Status: DC | PRN

## 2021-07-04 NOTE — Progress Notes (Signed)
START OFF PATHWAY REGIMEN - Breast ? ? ?[id number]:CMF (Cyclophosphamide IV + Methotrexate IV + Fluorouracil IV) q21 Days: ?  A cycle is every 21 days: ?    Cyclophosphamide  ?    Fluorouracil  ?    Methotrexate  ? ?**Always confirm dose/schedule in your pharmacy ordering system** ? ?Patient Characteristics: ?Postoperative without Neoadjuvant Therapy (Pathologic Staging), Invasive Disease, Adjuvant Therapy, HER2 Negative/Unknown/Equivocal, ER Negative/Unknown, Node Positive ?Therapeutic Status: Postoperative without Neoadjuvant Therapy (Pathologic Staging) ?AJCC Grade: G2 ?AJCC N Category: pN1 ?AJCC M Category: cM0 ?ER Status: Negative (-) ?AJCC 8 Stage Grouping: IIB ?HER2 Status: Negative (-) ?Oncotype Dx Recurrence Score: Not Appropriate ?AJCC T Category: pT2 ?PR Status: Negative (-) ?Adjuvant Therapy Status: No Adjuvant Therapy Received Yet or Changing Initial Adjuvant Regimen due to Tolerance ?Intent of Therapy: ?Curative Intent, Discussed with Patient ?

## 2021-07-04 NOTE — Assessment & Plan Note (Signed)
Left breast cancer invasive ductal carcinoma: 0.5 cm with 9 mm area of DCIS status post left mastectomy 01/04/2012 ER/PR positive HER-2 negative Ki-67 17% currently on Arimidex 1 mg daily ?Since 02/07/2012.??Completed 7 years of October 2020 ??(cecal adenocarcinoma diagnosed 04/22/2013, preop CEA of 5.1, status post right hemicolectomy 04/28/2014, grade 2, no MVI, 0/25 lymph nodes, subserosal involvement, T3 N0 M0 stage II A, no mismatch repair) ? ?Contralateral breast cancer: ?05/19/2021: Right breast mammogram and ultrasound: 1.6 cm mass in the right breast 12 o'clock position with architectural distortion highly suspicious for malignancy, no axillary lymph nodes, biopsy: Grade 3 IDC ER 0%, PR 0%, HER2 negative 1+ by IHC, Ki-67 20%  ? ?06/23/2021:Right mastectomy: Grade 3 IDC with DCIS, 3.4 cm, margins negative, 2/3 lymph nodes positive ER 0%, PR 0%, HER2 negative, Ki-67 20% ?? ? ?My concern is her ability to tolerate aggressive systemic chemotherapy.  We may consider treatment with CMF as an alternative. ?? ?CT CAP and bone scan: 06/08/2021: Multiple small lung nodules stable with the exception of new 4 mm left lower lobe lung nodule and a 6 mm right lower lobe lung nodule open (nonspecific) ? ?

## 2021-07-05 ENCOUNTER — Telehealth: Payer: Self-pay | Admitting: Hematology and Oncology

## 2021-07-05 ENCOUNTER — Encounter: Payer: Self-pay | Admitting: *Deleted

## 2021-07-05 NOTE — Telephone Encounter (Signed)
Scheduled appointment per 3/20 los. Patient is aware of upcoming appointments. ?

## 2021-07-14 ENCOUNTER — Telehealth: Payer: Self-pay | Admitting: *Deleted

## 2021-07-14 ENCOUNTER — Other Ambulatory Visit: Payer: Self-pay | Admitting: Genetic Counselor

## 2021-07-14 ENCOUNTER — Other Ambulatory Visit: Payer: Self-pay

## 2021-07-14 ENCOUNTER — Inpatient Hospital Stay: Payer: Medicare PPO

## 2021-07-14 ENCOUNTER — Inpatient Hospital Stay (HOSPITAL_BASED_OUTPATIENT_CLINIC_OR_DEPARTMENT_OTHER): Payer: Medicare PPO | Admitting: Genetic Counselor

## 2021-07-14 DIAGNOSIS — C189 Malignant neoplasm of colon, unspecified: Secondary | ICD-10-CM | POA: Diagnosis not present

## 2021-07-14 DIAGNOSIS — Z853 Personal history of malignant neoplasm of breast: Secondary | ICD-10-CM | POA: Diagnosis not present

## 2021-07-14 DIAGNOSIS — R918 Other nonspecific abnormal finding of lung field: Secondary | ICD-10-CM | POA: Diagnosis not present

## 2021-07-14 DIAGNOSIS — Z79899 Other long term (current) drug therapy: Secondary | ICD-10-CM | POA: Diagnosis not present

## 2021-07-14 DIAGNOSIS — C50111 Malignant neoplasm of central portion of right female breast: Secondary | ICD-10-CM

## 2021-07-14 DIAGNOSIS — Z85038 Personal history of other malignant neoplasm of large intestine: Secondary | ICD-10-CM | POA: Diagnosis not present

## 2021-07-14 DIAGNOSIS — Z8041 Family history of malignant neoplasm of ovary: Secondary | ICD-10-CM

## 2021-07-14 DIAGNOSIS — Z171 Estrogen receptor negative status [ER-]: Secondary | ICD-10-CM | POA: Diagnosis not present

## 2021-07-14 DIAGNOSIS — Z1379 Encounter for other screening for genetic and chromosomal anomalies: Secondary | ICD-10-CM

## 2021-07-14 DIAGNOSIS — M818 Other osteoporosis without current pathological fracture: Secondary | ICD-10-CM | POA: Diagnosis not present

## 2021-07-14 DIAGNOSIS — C50512 Malignant neoplasm of lower-outer quadrant of left female breast: Secondary | ICD-10-CM

## 2021-07-14 LAB — CMP (CANCER CENTER ONLY)
ALT: 14 U/L (ref 0–44)
AST: 14 U/L — ABNORMAL LOW (ref 15–41)
Albumin: 4.2 g/dL (ref 3.5–5.0)
Alkaline Phosphatase: 84 U/L (ref 38–126)
Anion gap: 9 (ref 5–15)
BUN: 15 mg/dL (ref 8–23)
CO2: 27 mmol/L (ref 22–32)
Calcium: 9.9 mg/dL (ref 8.9–10.3)
Chloride: 102 mmol/L (ref 98–111)
Creatinine: 0.71 mg/dL (ref 0.44–1.00)
GFR, Estimated: >60 mL/min (ref >60)
Glucose, Bld: 86 mg/dL (ref 70–99)
Potassium: 4.3 mmol/L (ref 3.5–5.1)
Sodium: 138 mmol/L (ref 135–145)
Total Bilirubin: 0.4 mg/dL (ref 0.3–1.2)
Total Protein: 7.3 g/dL (ref 6.5–8.1)

## 2021-07-14 LAB — CBC WITH DIFFERENTIAL (CANCER CENTER ONLY)
Abs Immature Granulocytes: 0.06 10*3/uL (ref 0.00–0.07)
Basophils Absolute: 0.1 10*3/uL (ref 0.0–0.1)
Basophils Relative: 1 %
Eosinophils Absolute: 0.3 10*3/uL (ref 0.0–0.5)
Eosinophils Relative: 4 %
HCT: 45.4 % (ref 36.0–46.0)
Hemoglobin: 14.9 g/dL (ref 12.0–15.0)
Immature Granulocytes: 1 %
Lymphocytes Relative: 12 %
Lymphs Abs: 1 10*3/uL (ref 0.7–4.0)
MCH: 28.9 pg (ref 26.0–34.0)
MCHC: 32.8 g/dL (ref 30.0–36.0)
MCV: 88.2 fL (ref 80.0–100.0)
Monocytes Absolute: 0.7 10*3/uL (ref 0.1–1.0)
Monocytes Relative: 8 %
Neutro Abs: 6.2 10*3/uL (ref 1.7–7.7)
Neutrophils Relative %: 74 %
Platelet Count: 268 10*3/uL (ref 150–400)
RBC: 5.15 MIL/uL — ABNORMAL HIGH (ref 3.87–5.11)
RDW: 12.6 % (ref 11.5–15.5)
WBC Count: 8.3 10*3/uL (ref 4.0–10.5)
nRBC: 0 % (ref 0.0–0.2)

## 2021-07-14 LAB — GENETIC SCREENING ORDER

## 2021-07-14 NOTE — Progress Notes (Unsigned)
Pt. had an education session today and was concerned that she did not receive a schedule or call for physical therapy for her right arm. I called Dr. Marlowe Aschoff office and spoke with scheduler whom indicated that the referral was started 07/08/21 and she sent me to [facility] for more assistance. Left a message with Anderson Malta to contact the pt. to get the physical therapy scheduled.  ?

## 2021-07-15 ENCOUNTER — Encounter: Payer: Self-pay | Admitting: Hematology and Oncology

## 2021-07-15 DIAGNOSIS — Z8041 Family history of malignant neoplasm of ovary: Secondary | ICD-10-CM | POA: Insufficient documentation

## 2021-07-15 NOTE — Progress Notes (Signed)
REFERRING PROVIDER: ?Nicholas Lose, MD ?[address] ?[place], [place] [address] ? ?PRIMARY PROVIDER:  ?Donnajean Lopes, MD ? ?PRIMARY REASON FOR VISIT:  ?1. Primary cancer of lower outer quadrant of left female breast ([place])   ?2. Malignant neoplasm of central portion of right breast in female, estrogen receptor negative ([place])   ?3. Primary colon cancer ([place])   ?4. Family history of ovarian cancer   ? ? ?HISTORY OF PRESENT ILLNESS:   ?Ann Maxwell, a 81 y.o. female, was seen for a [facility] cancer genetics consultation at the request of Dr. Lindi Adie due to a personal and family history of cancer.  Ann Maxwell presents to clinic today to discuss the possibility of a hereditary predisposition to cancer, to discuss genetic testing, and to further clarify her future cancer risks, as well as potential cancer risks for family members.  ? ?Ann Maxwell has a personal history of left breast cancer diagnosed at age 34 and right breast cancer diagnosed at age 89. She was also diagnosed with colon cancer at age 21. Ann Maxwell had negative BRCA1/2 genetic testing in 2013. ? ?CANCER HISTORY:  ?Oncology History  ?Primary cancer of lower outer quadrant of left female breast ([place])  ?01/04/2012 Surgery  ? Left mastectomy: Invasive ductal carcinoma 0.5 cm with high-grade DCIS 9 cm, margins negative, 0/6 lymph nodes, ER 100%, PR 100%, Ki-67 17%, HER-2 negative ratio 1.16 ?  ?02/07/2012 - 05/28/2016 Anti-estrogen oral therapy  ? Anastrozole 1 mg daily (stopped early due to Osteoporosis) ?  ?06/23/2021 Surgery  ? Right mastectomy: Grade 3 IDC with DCIS, 3.4 cm, margins negative, 2/3 lymph nodes positive ER 0%, PR 0%, HER2 negative, Ki-67 20% ?  ?Primary colon cancer ([place])  ?03/20/2014 Initial Diagnosis  ? Invasive adenocarcinoma of [place] ?  ?04/28/2014 Surgery  ? Right hemicolectomy: Invasive adenocarcinoma with mucinous features moderately differentiated invading subserosal tissue, margins negative, 25 lymph nodes negative, T3 N0 M0 stage II  a, no microsatellite instability ?  ?Malignant neoplasm of central portion of right breast in female, estrogen receptor negative ([place])  ?06/01/2021 Initial Diagnosis  ? Malignant neoplasm of central portion of right breast in female, estrogen receptor negative ([place]) ?  ?06/01/2021 Cancer Staging  ? Staging form: Breast, AJCC 8th Edition ?- Clinical: Stage IB (cT1c, cN0, cM0, G3, ER-, PR-, HER2-) - Signed by Nicholas Lose, MD on 06/01/2021 ?Histologic grading system: 3 grade system ? ?  ?08/01/2021 -  Chemotherapy  ? Patient is on Treatment Plan : BREAST Adjuvant CMF IV q21d  ?   ?Cancer of central portion of right breast ([place])  ?06/23/2021 Initial Diagnosis  ? Cancer of central portion of right breast [facility]) ?  ?08/01/2021 -  Chemotherapy  ? Patient is on Treatment Plan : BREAST Adjuvant CMF IV q21d  ?   ? ? ? ?RISK FACTORS:  ?Ovaries intact: no.  ?Uterus intact: no.  ?Menopausal status: postmenopausal.  ?HRT use: 10 years. ?Colonoscopy: yes ?Mammogram within the last year: yes. ? ?Past Medical History:  ?Diagnosis Date  ? Allergy   ? Anemia   ? iron infusion -last done 1 month ago(12-15, and 12-22 -62 [facility])- Dr. Lindi Adie  ? Anxiety   ? Asthma   ? Breast CA ([place]) 2013  ? a. L breast DCIS, s/p L mastectomy 12/2011. no further issues  ? Breast cancer ([place]) 2023  ? Right breast IDC  ? Cataract   ? bilat removed   ? Colon cancer ([place])   ? Depression   ?  pt denies any hx of depression   ? Environmental allergies   ? History of kidney stones   ? Hypertension   ? controlled on meds   ? Internal hemorrhoids   ? remains an issue- no bright bleeding seen recent  ? Iron deficiency anemia   ? Obesity   ? Osteoarthritis   ? arthritis- knees and shoulders  ? Osteoporosis   ? drug induced and on Fosamax  ? Transfusion history   ? 2 yrs ago due to anemia  ? Vertigo   ? no recent issues  ? ? ?Past Surgical History:  ?Procedure Laterality Date  ? BREAST BIOPSY Right 05/27/2021  ? U/S Bx  ? BREAST SURGERY    ? CATARACT EXTRACTION, BILATERAL   2019  ? CHOLECYSTECTOMY    ? COLON RESECTION N/A 04/28/2014  ? Procedure: LAPAROSCOPIC  RIGHT HEMI-COLECTOMY;  Surgeon: Stark Klein, MD;  [facility];  Service: General;  Laterality: N/A;  ? COLONOSCOPY    ? Lithectomy  2010  ? LITHOTRIPSY    ? MASTECTOMY Left 2013  ? MASTECTOMY W/ SENTINEL NODE BIOPSY  01/04/2012  ? Procedure: MASTECTOMY WITH SENTINEL LYMPH NODE BIOPSY;  Surgeon: Stark Klein, MD;  Location: [facility];  Service: General;  Laterality: Left;  ? ORIF TIBIA & FIBULA FRACTURES    ? wtih subsequent pin removal in 1997  ? ORIF ULNAR / RADIAL SHAFT FRACTURE Left   ? POLYPECTOMY    ? PORTACATH PLACEMENT Left 06/23/2021  ? Procedure: INSERTION PORT-A-CATH;  Surgeon: Stark Klein, MD;  Location: [facility];  Service: General;  Laterality: Left;  ? SENTINEL NODE BIOPSY Right 06/23/2021  ? Procedure: SENTINEL LYMPH NODE BIOPSY;  Surgeon: Stark Klein, MD;  Location: [facility];  Service: General;  Laterality: Right;  ? SIMPLE MASTECTOMY WITH AXILLARY SENTINEL NODE BIOPSY Right 06/23/2021  ? Procedure: RIGHT MASTECTOMY;  Surgeon: Stark Klein, MD;  Location: [facility];  Service: General;  Laterality: Right;  ? TONSILLECTOMY AND ADENOIDECTOMY    ? TOTAL KNEE ARTHROPLASTY Left 08/23/2015  ? Procedure: TOTAL KNEE ARTHROPLASTY;  Surgeon: Gaynelle Arabian, MD;  [facility];  Service: Orthopedics;  Laterality: Left;  ? VAGINAL HYSTERECTOMY  20 yrs. ago  ? with BSO  ? ? ?Social History  ? ?Socioeconomic History  ? Marital status: Divorced  ?  Spouse name: Not on file  ? Number of children: Not on file  ? Years of education: Not on file  ? Highest education level: Not on file  ?Occupational History  ? Occupation: retired Marine scientist  ?Tobacco Use  ? Smoking status: Never  ? Smokeless tobacco: Never  ?Vaping Use  ? Vaping Use: Never used  ?Substance and Sexual Activity  ? Alcohol use: Yes  ?  Alcohol/week: 0.0 standard drinks  ?  Comment: occasionally - few times a year   ? Drug  use: Never  ? Sexual activity: Not Currently  ?Other Topics Concern  ? Not on file  ?Social History Narrative  ? Retired Sports coach, 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 who is a Marine scientist  ? ?Social Determinants of Health  ? ?Financial Resource Strain: Not on file  ?Food Insecurity: Not on file  ?Transportation Needs: Not on file  ?Physical Activity: Not on file  ?Stress: Not on file  ?Social Connections: Not on file  ?  ? ?FAMILY HISTORY:  ?We obtained a detailed, 4-generation family history.  Significant diagnoses are listed below: ?Family History  ?Problem Relation Age of Onset  ?  Ovarian cancer Mother 18  ?     deceased 105  ? Uterine cancer Maternal Grandmother   ?     late 95s  ? Liver cancer Paternal Grandfather 66  ?     deceased 65s  ? Thyroid cancer Sister 82  ?     currently 70  ? ? ? ?Ann Maxwell sister was diagnosed with thyroid cancer at age 63. Her mother was diagnosed with ovarian cancer at age 61, she died at age 85. Her maternal grandmother was diagnosed with uterine cancer in her 32s, she is deceased. Ann Maxwell father possibly had thyroid cancer, he died at age 57. Her paternal grandfather was diagnosed with liver cancer at age 24 and died in his 43s. ? ?Ann Maxwell is unaware of previous family history of genetic testing for hereditary cancer risks. There is no reported Ashkenazi Jewish ancestry. ? ?GENETIC COUNSELING ASSESSMENT: Ann Maxwell is a 81 y.o. female with a personal and family history of cancer which is somewhat suggestive of a hereditary predisposition to cancer. We, therefore, discussed and recommended the following at today's visit.  ? ?DISCUSSION: We discussed that 5 - 10% of cancer is hereditary, with most cases of breast cancer associated with BRCA1/2 and most cases of colon cancer associated with Lynch Syndrome.  There are other genes that can be associated with hereditary cancer syndromes.  We discussed that testing is beneficial for several reasons including knowing how to follow individuals after  completing their treatment, identifying whether potential treatment options would be beneficial, and understanding if other family members could be at risk for cancer and allowing them to undergo genetic tes

## 2021-07-18 NOTE — Therapy (Signed)
?OUTPATIENT PHYSICAL THERAPY ONCOLOGY EVALUATION ? ?Patient Name: Ann Maxwell ?MRN: [id number] ?DOB:04/26/1940, 81 y.o., female ?Today's Date: 07/19/2021 ? ? ? ?Past Medical History:  ?Diagnosis Date  ? Allergy   ? Anemia   ? iron infusion -last done 1 month ago(12-15, and 12-22 -21 [facility])- Dr. Lindi Adie  ? Anxiety   ? Asthma   ? Breast CA ([place]) 2013  ? a. L breast DCIS, s/p L mastectomy 12/2011. no further issues  ? Breast cancer ([place]) 2023  ? Right breast IDC  ? Cataract   ? bilat removed   ? Colon cancer ([place])   ? Depression   ? pt denies any hx of depression   ? Environmental allergies   ? History of kidney stones   ? Hypertension   ? controlled on meds   ? Internal hemorrhoids   ? remains an issue- no bright bleeding seen recent  ? Iron deficiency anemia   ? Obesity   ? Osteoarthritis   ? arthritis- knees and shoulders  ? Osteoporosis   ? drug induced and on Fosamax  ? Transfusion history   ? 2 yrs ago due to anemia  ? Vertigo   ? no recent issues  ? ?Past Surgical History:  ?Procedure Laterality Date  ? BREAST BIOPSY Right 05/27/2021  ? U/S Bx  ? BREAST SURGERY    ? CATARACT EXTRACTION, BILATERAL  2019  ? CHOLECYSTECTOMY    ? COLON RESECTION N/A 04/28/2014  ? Procedure: LAPAROSCOPIC  RIGHT HEMI-COLECTOMY;  Surgeon: Stark Klein, MD;  [facility];  Service: General;  Laterality: N/A;  ? COLONOSCOPY    ? Lithectomy  2010  ? LITHOTRIPSY    ? MASTECTOMY Left 2013  ? MASTECTOMY W/ SENTINEL NODE BIOPSY  01/04/2012  ? Procedure: MASTECTOMY WITH SENTINEL LYMPH NODE BIOPSY;  Surgeon: Stark Klein, MD;  Location: [facility];  Service: General;  Laterality: Left;  ? ORIF TIBIA & FIBULA FRACTURES    ? wtih subsequent pin removal in 1997  ? ORIF ULNAR / RADIAL SHAFT FRACTURE Left   ? POLYPECTOMY    ? PORTACATH PLACEMENT Left 06/23/2021  ? Procedure: INSERTION PORT-A-CATH;  Surgeon: Stark Klein, MD;  Location: [facility];  Service: General;  Laterality: Left;  ? SENTINEL NODE BIOPSY Right 06/23/2021  ? Procedure:  SENTINEL LYMPH NODE BIOPSY;  Surgeon: Stark Klein, MD;  Location: [facility];  Service: General;  Laterality: Right;  ? SIMPLE MASTECTOMY WITH AXILLARY SENTINEL NODE BIOPSY Right 06/23/2021  ? Procedure: RIGHT MASTECTOMY;  Surgeon: Stark Klein, MD;  Location: [facility];  Service: General;  Laterality: Right;  ? TONSILLECTOMY AND ADENOIDECTOMY    ? TOTAL KNEE ARTHROPLASTY Left 08/23/2015  ? Procedure: TOTAL KNEE ARTHROPLASTY;  Surgeon: Gaynelle Arabian, MD;  [facility];  Service: Orthopedics;  Laterality: Left;  ? VAGINAL HYSTERECTOMY  20 yrs. ago  ? with BSO  ? ?Patient Active Problem List  ? Diagnosis Date Noted  ? Family history of ovarian cancer 07/15/2021  ? Cancer of central portion of right breast ([place]) 06/23/2021  ? Malignant neoplasm of central portion of right breast in female, estrogen receptor negative ([place]) 06/01/2021  ? Pain in right knee 06/12/2019  ? Asthma 10/17/2017  ? OA (osteoarthritis) of knee 08/23/2015  ? Primary colon cancer ([place]) 03/31/2014  ? Iron deficiency anemia due to chronic blood loss 07/01/2013  ? Sinus tachycardia 09/12/2012  ? Mobitz type 1 second degree AV block 09/12/2012  ? Chest pain  09/11/2012  ? HTN (hypertension) 09/11/2012  ? Asthma, chronic 09/11/2012  ? Primary cancer of lower outer quadrant of left female breast ([place]) 12/12/2011  ? ? ?PCP: Donnajean Lopes, MD ? ?REFERRING PROVIDER: Stark Klein, MD ? ?REFERRING DIAG: Right breast cancer s/p Mastectomy ? ?THERAPY DIAG:  ?No diagnosis found. ? ?ONSET DATE: 05/2021 ? ?SUBJECTIVE                                                                                                                                                                                          ? ?SUBJECTIVE STATEMENT:Pt is here for post op assessment after having a right Mastectomy  06/23/2021 for Invasive Ductal Carcinoma with 2/3 LN, and Triple Negative with a Ki 67 of 20%. She is pending chemo and radiation. She is  having some hypersensitivity around her incision which has improved by adding some gabapentin. Also having some right shoulder pain and limitations, and some chronic left shoulder pain since her prior left mastectomy. ? ?PERTINENT HISTORY:  ?06/23/2021 Surgery Right mastectomy: Grade 3 IDC with DCIS, 3.4 cm, margins negative, 2/3 lymph nodes positive ER 0%, PR 0%, HER2 negative, Ki-67 20%  ? ?01/04/2012 Surgery  ?  Left mastectomy: Invasive ductal carcinoma 0.5 cm with high-grade DCIS 9 cm, margins negative, 0/6 lymph nodes, ER 100%, PR 100%, Ki-67 17%, HER-2   ?Pt also has a history of surgery for Colon cancer in 2016 ? ?PAIN:  ?Are you having pain? Yes ?NPRS scale: 4/10 right shoulder and left shoulder ?Pain location: right shoulder from surgery/ and left shoulder chronic since left mastectomy ?Pain orientation: Right and Left  ?PAIN TYPE: dull and tight ?Pain description: intermittent and aching  ?Aggravating factors: raising arms ?Relieving factors: arms at her side ? ?PRECAUTIONS: Other: Lymphedema risk bilaterally, Right knee pain , HBP ? ?WEIGHT BEARING RESTRICTIONS No, but has right knee pain and has delayed TKA ? ?FALLS:  ?Has patient fallen in last 6 months? No ? ?LIVING ENVIRONMENT: ?Lives with: lives alone ?Lives in: House/apartment ?Stairs: Yes; External: 4 steps; on right going up ?Has following equipment at home: nothing right now ? ?OCCUPATION: retired,Nursing faculty at [employer] ? ?LEISURE: quilting, reading, puzzles ? ?HAND DOMINANCE : right  ? ?PRIOR LEVEL OF FUNCTION: Independent ? ?PATIENT GOALS Decrease pain and swelling, Improve ROM ? ? ?OBJECTIVE ? ?COGNITION: ? Overall cognitive status: Within functional limits for tasks assessed  ? ?PALPATION: Soft swelling inferior to right mastectomy incision ? ?OBSERVATIONS / OTHER ASSESSMENTS: incision healing well. Glue still present over mastectomy incision, drain incision still not fully healed. ? ?SENSATION: ? Light touch: Appears intact, not yet normal on  right ?  ? ?POSTURE: forward head, rounded shoulders ? ?UPPER EXTREMITY AROM/PROM: ? ?A/PROM RIGHT  07/18/2021 ?  ?Shoulder extension 57  ?Shoulder flexion 112 pain with lowering  ?Shoulder abduction 76 pulls in chest  ?Shoulder internal rotation   ?Shoulder external rotation   ?  (Blank rows = not tested) ? ?A/PROM LEFT  07/18/2021  ?Shoulder extension 62  ?Shoulder flexion 152  ?Shoulder abduction 152  ?Shoulder internal rotation   ?Shoulder external rotation 90  ?  (Blank rows = not tested) ? ? ?CERVICAL AROM: ?All within functional limits: left rotation limited slightly more than right, C6 disc ? ? ? ? ?UPPER EXTREMITY STRENGTH: NT on right, WFL on left ? ? ? ?LYMPHEDEMA ASSESSMENTS:  ? ?SURGERY TYPE/DATE: Right Mastectomy with SLNB, 06/23/2021 , Left Mastectomy with SLNB 01/04/2012 ? ?NUMBER OF LYMPH NODES REMOVED: Right 2+/3, left 0/6 ? ?CHEMOTHERAPY: pending ? ?RADIATION:pending ? ?HORMONE TREATMENT: prior hormone treatment ? ?INFECTIONS: no ? ?LYMPHEDEMA ASSESSMENTS:  ? ?[place] RIGHT  07/18/2021  ?10 cm proximal to olecranon process 37.4 (from front of elbow)  ?Olecranon process 29.0  ?10 cm proximal to ulnar styloid process 24.5  ?Just proximal to ulnar styloid process 19.5  ?Across hand at thumb web space 19.8  ?At base of 2nd digit 6.3  ?(Blank rows = not tested) ? ?[place] LEFT  07/18/2021  ?10 cm proximal to olecranon process 38.6 front of elbow  ?Olecranon process 30.3  ?10 cm proximal to ulnar styloid process 24.8  ?Just proximal to ulnar styloid process 19.9  ?Across hand at thumb web space 19.7  ?At base of 2nd digit 6.3  ?(Blank rows = not tested) ? ? ? ? ? ?QUICK DASH SURVEY: 36.36% ? ? ?TODAY'S TREATMENT  ?Pt educated in Clasped hands flexion and stargazer stretch in supine, and sitting scapular retraction. Did not show abduction wall slides yet.  Long piece of half inch gray foam cut and placed in TG soft to wear in bra in area of swelling under incision, and 2 smaller pieces made to wear under band of  bra for comfort ? ?PATIENT EDUCATION:  ?Education details: 3 post op exercises/not educated in abduction wall climb yet, use of foam in bra to decrease swelling ?Person educated: Patient ?Education [employer]

## 2021-07-19 ENCOUNTER — Ambulatory Visit: Payer: Medicare PPO | Attending: General Surgery

## 2021-07-19 ENCOUNTER — Encounter: Payer: Self-pay | Admitting: *Deleted

## 2021-07-19 DIAGNOSIS — Z9011 Acquired absence of right breast and nipple: Secondary | ICD-10-CM | POA: Insufficient documentation

## 2021-07-19 DIAGNOSIS — M25611 Stiffness of right shoulder, not elsewhere classified: Secondary | ICD-10-CM | POA: Diagnosis not present

## 2021-07-19 DIAGNOSIS — M25512 Pain in left shoulder: Secondary | ICD-10-CM | POA: Diagnosis not present

## 2021-07-19 DIAGNOSIS — R293 Abnormal posture: Secondary | ICD-10-CM | POA: Insufficient documentation

## 2021-07-19 DIAGNOSIS — M25511 Pain in right shoulder: Secondary | ICD-10-CM | POA: Diagnosis not present

## 2021-07-20 ENCOUNTER — Telehealth: Payer: Self-pay | Admitting: Hematology and Oncology

## 2021-07-20 NOTE — Telephone Encounter (Signed)
Rescheduled appointment per 4/3 email per [facility]. Patient is aware of the changes made to her upcoming appointment. ?

## 2021-07-20 NOTE — Therapy (Signed)
?OUTPATIENT PHYSICAL THERAPY TREATMENT NOTE ? ? ?Patient Name: Ann Maxwell ?MRN: [id number] ?DOB:19-Feb-1941, 81 y.o., female ?Today's Date: 07/21/2021 ? ?PCP: Donnajean Lopes, MD ?REFERRING PROVIDER: Stark Klein, MD ? ?END OF SESSION:  ? PT End of Session - 07/21/21 0857   ? ? Visit Number 2   ? Number of Visits 12   ? Date for PT Re-Evaluation 08/30/21   ? Authorization Type [employer]   ? PT Start Time 0900   ? PT Stop Time 0950   ? PT Time Calculation (min) 50 min   ? Activity Tolerance Patient tolerated treatment well   ? Behavior During Therapy [facility] for tasks assessed/performed   ? ?  ?  ? ?  ? ? ?Past Medical History:  ?Diagnosis Date  ? Allergy   ? Anemia   ? iron infusion -last done 1 month ago(12-15, and 12-22 -43 [facility])- Dr. Lindi Adie  ? Anxiety   ? Asthma   ? Breast CA ([place]) 2013  ? a. L breast DCIS, s/p L mastectomy 12/2011. no further issues  ? Breast cancer ([place]) 2023  ? Right breast IDC  ? Cataract   ? bilat removed   ? Colon cancer ([place])   ? Depression   ? pt denies any hx of depression   ? Environmental allergies   ? History of kidney stones   ? Hypertension   ? controlled on meds   ? Internal hemorrhoids   ? remains an issue- no bright bleeding seen recent  ? Iron deficiency anemia   ? Obesity   ? Osteoarthritis   ? arthritis- knees and shoulders  ? Osteoporosis   ? drug induced and on Fosamax  ? Transfusion history   ? 2 yrs ago due to anemia  ? Vertigo   ? no recent issues  ? ?Past Surgical History:  ?Procedure Laterality Date  ? BREAST BIOPSY Right 05/27/2021  ? U/S Bx  ? BREAST SURGERY    ? CATARACT EXTRACTION, BILATERAL  2019  ? CHOLECYSTECTOMY    ? COLON RESECTION N/A 04/28/2014  ? Procedure: LAPAROSCOPIC  RIGHT HEMI-COLECTOMY;  Surgeon: Stark Klein, MD;  Location: [facility];  Service: General;  Laterality: N/A;  ? COLONOSCOPY    ? Lithectomy  2010  ? LITHOTRIPSY    ? MASTECTOMY Left 2013  ? MASTECTOMY W/ SENTINEL NODE BIOPSY  01/04/2012  ? Procedure: MASTECTOMY WITH SENTINEL LYMPH NODE BIOPSY;   Surgeon: Stark Klein, MD;  Location: [place];  Service: General;  Laterality: Left;  ? ORIF TIBIA & FIBULA FRACTURES    ? wtih subsequent pin removal in 1997  ? ORIF ULNAR / RADIAL SHAFT FRACTURE Left   ? POLYPECTOMY    ? PORTACATH PLACEMENT Left 06/23/2021  ? Procedure: INSERTION PORT-A-CATH;  Surgeon: Stark Klein, MD;  Location: [place];  Service: General;  Laterality: Left;  ? SENTINEL NODE BIOPSY Right 06/23/2021  ? Procedure: SENTINEL LYMPH NODE BIOPSY;  Surgeon: Stark Klein, MD;  Location: [place];  Service: General;  Laterality: Right;  ? SIMPLE MASTECTOMY WITH AXILLARY SENTINEL NODE BIOPSY Right 06/23/2021  ? Procedure: RIGHT MASTECTOMY;  Surgeon: Stark Klein, MD;  Location: [place];  Service: General;  Laterality: Right;  ? TONSILLECTOMY AND ADENOIDECTOMY    ? TOTAL KNEE ARTHROPLASTY Left 08/23/2015  ? Procedure: TOTAL KNEE ARTHROPLASTY;  Surgeon: Gaynelle Arabian, MD;  Location: [facility];  Service: Orthopedics;  Laterality: Left;  ? VAGINAL HYSTERECTOMY  20 yrs. ago  ?  with BSO  ? ?Patient Active Problem List  ? Diagnosis Date Noted  ? Family history of ovarian cancer 07/15/2021  ? Cancer of central portion of right breast ([place]) 06/23/2021  ? Malignant neoplasm of central portion of right breast in female, estrogen receptor negative ([place]) 06/01/2021  ? Pain in right knee 06/12/2019  ? Asthma 10/17/2017  ? OA (osteoarthritis) of knee 08/23/2015  ? Primary colon cancer ([place]) 03/31/2014  ? Iron deficiency anemia due to chronic blood loss 07/01/2013  ? Sinus tachycardia 09/12/2012  ? Mobitz type 1 second degree AV block 09/12/2012  ? Chest pain 09/11/2012  ? HTN (hypertension) 09/11/2012  ? Asthma, chronic 09/11/2012  ? Primary cancer of lower outer quadrant of left female breast ([place]) 12/12/2011  ? ? ?REFERRING DIAG: s/p Right mastectomy ? ?THERAPY DIAG:  ?Status post right mastectomy ? ?Abnormal posture ? ?Stiffness of right shoulder, not elsewhere  classified ? ?Bilateral shoulder pain, unspecified chronicity ? ?PERTINENT HISTORY:  ?06/23/2021 Surgery Right mastectomy: Grade 3 IDC with DCIS, 3.4 cm, margins negative, 2/3 lymph nodes positive ER 0%, PR 0%, HER2 negative, Ki-67 20%  ?  ?01/04/2012 Surgery  ?  Left mastectomy: Invasive ductal carcinoma 0.5 cm with high-grade DCIS 9 cm, margins negative, 0/6 lymph nodes, ER 100%, PR 100%, Ki-67 17%, HER-2   ?Pt also has a history of surgery for Colon cancer in 2016 ? ? ?PRECAUTIONS: Other: Lymphedema risk bilaterally, Right knee pain , HBP ?  ? ?SUBJECTIVE: I have been doing the exercises and I already feel like my motion is better. I have tried the foam pad. I don't think it is quite big enough. ? ?PAIN:  ?Are you having pain? No, discomfort ? ? ? ?OBJECTIVE ?  ?COGNITION: ?           Overall cognitive status: Within functional limits for tasks assessed  ?  ?PALPATION: Soft swelling inferior to right mastectomy incision ?  ?OBSERVATIONS / OTHER ASSESSMENTS: incision healing well. Glue still present over mastectomy incision, drain incision still not fully healed. Swelling noted around incision ?  ?SENSATION: ?           Light touch: Appears intact, not yet normal on right ?            ?  ?POSTURE: forward head, rounded shoulders ?  ?UPPER EXTREMITY AROM/PROM: ?  ?A/PROM RIGHT  07/18/2021 ?   ?Shoulder extension 57  ?Shoulder flexion 112 pain with lowering  ?Shoulder abduction 76 pulls in chest  ?Shoulder internal rotation    ?Shoulder external rotation    ?                        (Blank rows = not tested) ?  ?A/PROM LEFT  07/18/2021  ?Shoulder extension 62  ?Shoulder flexion 152  ?Shoulder abduction 152  ?Shoulder internal rotation    ?Shoulder external rotation 90  ?                        (Blank rows = not tested) ?  ?  ?CERVICAL AROM: ?All within functional limits: left rotation limited slightly more than right, C6 disc ?  ?  ?  ?  ?UPPER EXTREMITY STRENGTH: NT on right, WFL on left ?  ?  ?  ?LYMPHEDEMA ASSESSMENTS:   ?  ?SURGERY TYPE/DATE: Right Mastectomy with SLNB, 06/23/2021 , Left Mastectomy with SLNB 01/04/2012 ?  ?NUMBER OF LYMPH NODES REMOVED: Right 2+/3, left  0/6 ?  ?CHEMOTHERAPY: pending ?  ?RADIATION:pending ?  ?HORMONE TREATMENT: prior hormone treatment ?  ?INFECTIONS: no ?  ?LYMPHEDEMA ASSESSMENTS:  ?  ?[place] RIGHT  07/18/2021  ?10 cm proximal to olecranon process 37.4 (from front of elbow)  ?Olecranon process 29.0  ?10 cm proximal to ulnar styloid process 24.5  ?Just proximal to ulnar styloid process 19.5  ?Across hand at thumb web space 19.8  ?At base of 2nd digit 6.3  ?(Blank rows = not tested) ?  ?[place] LEFT  07/18/2021  ?10 cm proximal to olecranon process 38.6 front of elbow  ?Olecranon process 30.3  ?10 cm proximal to ulnar styloid process 24.8  ?Just proximal to ulnar styloid process 19.9  ?Across hand at thumb web space 19.7  ?At base of 2nd digit 6.3  ?(Blank rows = not tested) ?  ?  ?  ?  ?  ?QUICK DASH SURVEY: 36.36% ?  ?  ?TODAY'S TREATMENT  ?07/21/2021 ?MANUAL: soft tissue mobilization to bilateral UT, right pectorals, lats in supine and in left SL to right scapular area ?Gentle PROM to right shoulder flexion, scaption, abd, ER, IR. ?THERAPEUTIC EXERCISE; Reviewed supine clasped hands flexion and supine ER. Tried supine wand flexion and scaption but held secondary to crepitus in left shoulder that was uncomfortable. Tried abduction wall slide but held secondary to discomfort. Supine trunk rotation with knees to the left to stretch right lateral trunk x 3 ?Larger foam pad made for right side of chest swelling and to make bra more comfortable at border and placed in large TG soft. Pt also given 2(12 cm) wraps to try at home for anchoring foam if pad in bra doesn't work. ? ? ?07/18/2021 ?Pt educated in Clasped hands flexion and stargazer stretch in supine, and sitting scapular retraction. Did not show abduction wall slides yet.  Long piece of half inch gray foam cut and placed in TG soft to wear in bra in area  of swelling under incision, and 2 smaller pieces made to wear under band of bra for comfort ?  ?PATIENT EDUCATION:  ?Education details: 3 post op exercises/not educated in abduction wall climb yet, use of foam in b

## 2021-07-21 ENCOUNTER — Ambulatory Visit: Payer: Medicare PPO

## 2021-07-21 DIAGNOSIS — R293 Abnormal posture: Secondary | ICD-10-CM

## 2021-07-21 DIAGNOSIS — M25512 Pain in left shoulder: Secondary | ICD-10-CM | POA: Diagnosis not present

## 2021-07-21 DIAGNOSIS — M25611 Stiffness of right shoulder, not elsewhere classified: Secondary | ICD-10-CM

## 2021-07-21 DIAGNOSIS — M25511 Pain in right shoulder: Secondary | ICD-10-CM

## 2021-07-21 DIAGNOSIS — Z9011 Acquired absence of right breast and nipple: Secondary | ICD-10-CM

## 2021-07-21 NOTE — Progress Notes (Signed)
? ?Patient Care Team: ?Donnajean Lopes, MD as PCP - General (Internal Medicine) ?Mauro Kaufmann, RN as Oncology Nurse Navigator ?Rockwell Germany, RN as Oncology Nurse Navigator ? ?DIAGNOSIS:  ?Encounter Diagnosis  ?Name Primary?  ? Primary cancer of lower outer quadrant of left female breast ([place])   ? ? ?SUMMARY OF ONCOLOGIC HISTORY: ?Oncology History  ?Primary cancer of lower outer quadrant of left female breast ([place])  ?01/04/2012 Surgery  ? Left mastectomy: Invasive ductal carcinoma 0.5 cm with high-grade DCIS 9 cm, margins negative, 0/6 lymph nodes, ER 100%, PR 100%, Ki-67 17%, HER-2 negative ratio 1.16 ? ?  ?02/07/2012 - 05/28/2016 Anti-estrogen oral therapy  ? Anastrozole 1 mg daily (stopped early due to Osteoporosis) ?  ?06/23/2021 Surgery  ? Right mastectomy: Grade 3 IDC with DCIS, 3.4 cm, margins negative, 2/3 lymph nodes positive ER 0%, PR 0%, HER2 negative, Ki-67 20% ?  ?Primary colon cancer ([place])  ?03/20/2014 Initial Diagnosis  ? Invasive adenocarcinoma of [place] ? ?  ?04/28/2014 Surgery  ? Right hemicolectomy: Invasive adenocarcinoma with mucinous features moderately differentiated invading subserosal tissue, margins negative, 25 lymph nodes negative, T3 N0 M0 stage II a, no microsatellite instability ? ?  ?Malignant neoplasm of central portion of right breast in female, estrogen receptor negative ([place])  ?06/01/2021 Initial Diagnosis  ? Malignant neoplasm of central portion of right breast in female, estrogen receptor negative ([place]) ? ?  ?06/01/2021 Cancer Staging  ? Staging form: Breast, AJCC 8th Edition ?- Clinical: Stage IB (cT1c, cN0, cM0, G3, ER-, PR-, HER2-) - Signed by Nicholas Lose, MD on 06/01/2021 ?Histologic grading system: 3 grade system ? ?  ?08/02/2021 -  Chemotherapy  ? Patient is on Treatment Plan : BREAST Adjuvant CMF IV q21d  ? ?  ?  ?Cancer of central portion of right breast ([place])  ?06/23/2021 Initial Diagnosis  ? Cancer of central portion of right breast ([place]) ? ?  ?08/02/2021 -   Chemotherapy  ? Patient is on Treatment Plan : BREAST Adjuvant CMF IV q21d  ? ?  ?  ? ? ?CHIEF COMPLIANT: Cycle 1 CMF ? ?INTERVAL HISTORY: Ann Maxwell is a  81 y.o. with above-mentioned history of left breast cancer who is currently on surveillance after completion of adjuvant antiestrogen therapy with anastrozole. She presents to the clinic today for a follow-up and treatment.  She is complaining of stress and anxiety.  She also has hemorrhoids.  She stopped taking Celebrex for arthritis because of some NSAID. ? ? ?ALLERGIES:  is allergic to other, pollen extract, tape, tyloxapol, and terramycin [oxytetracycline]. ? ?MEDICATIONS:  ?Current Outpatient Medications  ?Medication Sig Dispense Refill  ? ALPRAZolam (XANAX) 0.25 MG tablet Take 1 tablet (0.25 mg total) by mouth at bedtime as needed for anxiety. 30 tablet 0  ? hydrocortisone (ANUSOL-HC) 2.5 % rectal cream Place 1 application. rectally 2 (two) times daily. 30 g 1  ? ADVAIR DISKUS 250-50 MCG/DOSE AEPB Inhale 1 puff into the lungs 2 (two) times daily. 180 each 0  ? albuterol (VENTOLIN HFA) 108 (90 Base) MCG/ACT inhaler Inhale 2 puffs into the lungs every 6 (six) hours as needed for wheezing or shortness of breath.    ? alendronate (FOSAMAX) 70 MG tablet Take 1 tablet (70 mg total) by mouth once a week. Take with a full glass of water on an empty stomach.    ? gabapentin (NEURONTIN) 100 MG capsule Take 1 capsule (100 mg total) by mouth 3 (three) times daily. (Patient taking differently:  Take 100 mg by mouth at bedtime.)    ? hydrochlorothiazide (MICROZIDE) 12.5 MG capsule Take 12.5 mg by mouth daily.    ? lidocaine-prilocaine (EMLA) cream Apply to affected area once 30 g 3  ? loratadine (CLARITIN) 10 MG tablet Take 10 mg by mouth daily as needed for allergies.    ? losartan (COZAAR) 50 MG tablet Take 50 mg by mouth every evening.    ? melatonin 5 MG TABS Take 5 mg by mouth at bedtime as needed (sleep).    ? montelukast (SINGULAIR) 10 MG tablet Take 10 mg by  mouth at bedtime.    ? ondansetron (ZOFRAN) 8 MG tablet Take 1 tablet (8 mg total) by mouth 2 (two) times daily as needed for refractory nausea / vomiting. Start on day 3 after chemotherapy. 30 tablet 1  ? polyvinyl alcohol (LIQUIFILM TEARS) 1.4 % ophthalmic solution Place 2 drops into both eyes as needed for dry eyes.    ? prochlorperazine (COMPAZINE) 10 MG tablet Take 1 tablet (10 mg total) by mouth every 6 (six) hours as needed (Nausea or vomiting). 30 tablet 1  ? Vitamin D, Ergocalciferol, (DRISDOL) 1.25 MG (50000 UNIT) CAPS capsule Take 50,000 Units by mouth every 7 (seven) days.    ? ?No current facility-administered medications for this visit.  ? ? ?PHYSICAL EXAMINATION: ?ECOG PERFORMANCE STATUS: 1 - Symptomatic but completely ambulatory ? ?Vitals:  ? 08/02/21 0827  ?BP: 133/80  ?Pulse: 62  ?Resp: 18  ?Temp: 97.7 ?F (36.5 ?C)  ?SpO2: 100%  ? ?Filed Weights  ? 08/02/21 0827  ?Weight: 202 lb 8 oz (91.9 kg)  ? ?  ? ?LABORATORY DATA:  ?I have reviewed the data as listed ? ?  Latest Ref Rng & Units 07/14/2021  ? 10:37 AM 06/16/2021  ? 12:18 PM 05/11/2021  ? 11:49 AM  ?CMP  ?Glucose 70 - 99 mg/dL 86   120   95    ?BUN 8 - 23 mg/dL 15   19   14     ?Creatinine 0.44 - 1.00 mg/dL 0.71   0.67   0.66    ?Sodium 135 - 145 mmol/L 138   138   136    ?Potassium 3.5 - 5.1 mmol/L 4.3   4.5   3.7    ?Chloride 98 - 111 mmol/L 102   103   101    ?CO2 22 - 32 mmol/L 27   28   26     ?Calcium 8.9 - 10.3 mg/dL 9.9   9.0   9.5    ?Total Protein 6.5 - 8.1 g/dL 7.3    7.2    ?Total Bilirubin 0.3 - 1.2 mg/dL 0.4    0.6    ?Alkaline Phos 38 - 126 U/L 84    65    ?AST 15 - 41 U/L 14    16    ?ALT 0 - 44 U/L 14    18    ? ? ?Lab Results  ?Component Value Date  ? WBC 8.5 08/02/2021  ? HGB 14.4 08/02/2021  ? HCT 43.4 08/02/2021  ? MCV 88.6 08/02/2021  ? PLT 234 08/02/2021  ? NEUTROABS 6.4 08/02/2021  ? ? ?ASSESSMENT & PLAN:  ?Primary cancer of lower outer quadrant of left female breast ([place]) ?Left breast cancer invasive ductal carcinoma: 0.5 cm  with 9 mm area of DCIS status post left mastectomy 01/04/2012 ER/PR positive HER-2 negative Ki-67 17% currently on Arimidex 1 mg daily  Since 02/07/2012.  Completed  7 years of October 2020 ? (cecal adenocarcinoma diagnosed 04/22/2013, preop CEA of 5.1, status post right hemicolectomy 04/28/2014, grade 2, no MVI, 0/25 lymph nodes, subserosal involvement, T3 N0 M0 stage II A, no mismatch repair) ?  ?Contralateral breast cancer: ?05/19/2021: Right breast mammogram and ultrasound: 1.6 cm mass in the right breast 12 o'clock position with architectural distortion highly suspicious for malignancy, no axillary lymph nodes, biopsy: Grade 3 IDC ER 0%, PR 0%, HER2 negative 1+ by IHC, Ki-67 20%  ?  ?06/23/2021:Right mastectomy: Grade 3 IDC with DCIS, 3.4 cm, margins negative, 2/3 lymph nodes positive ER 0%, PR 0%, HER2 negative, Ki-67 20% ?   ?CT CAP and bone scan: 06/08/2021: Multiple small lung nodules stable with the exception of new 4 mm left lower lobe lung nodule and a 6 mm right lower lobe lung nodule open (nonspecific) ?  ?Treatment plan: ?Adjuvant chemotherapy with CMF x6 cycles ?Adjuvant radiation ?------------------------------------------------------------------------------------------------------- ?Current treatment: Cycle 1 day 1 CMF ?Chemo consent obtained, chemo education completed, antiemetics were reviewed, labs were reviewed ?Anxiety: I sent a prescription for Xanax to be taken at bedtime as needed. ? ?Return to clinic in 1 week for toxicity check ? ? ? ?No orders of the defined types were placed in this encounter. ? ?The patient has a good understanding of the overall plan. she agrees with it. she will call with any problems that may develop before the next visit here. ?Total time spent: 30 mins including face to face time and time spent for planning, charting and co-ordination of care ? ? Harriette Ohara, MD ?08/02/21 ? ? ? I Gardiner Coins am scribing for Dr. Lindi Adie ? ?I have reviewed the above documentation  for accuracy and completeness, and I agree with the above. ?  ?

## 2021-07-26 ENCOUNTER — Telehealth: Payer: Self-pay | Admitting: Genetic Counselor

## 2021-07-26 ENCOUNTER — Encounter: Payer: Self-pay | Admitting: Genetic Counselor

## 2021-07-26 ENCOUNTER — Ambulatory Visit: Payer: Self-pay | Admitting: Genetic Counselor

## 2021-07-26 DIAGNOSIS — Z1379 Encounter for other screening for genetic and chromosomal anomalies: Secondary | ICD-10-CM | POA: Insufficient documentation

## 2021-07-26 NOTE — Telephone Encounter (Signed)
I contacted [name] to discuss her genetic testing results. No pathogenic variants were identified in the 77 genes analyzed. Detailed clinic note to follow. ? ?The test report has been scanned into EPIC and is located under the Molecular Pathology section of the Results Review tab.  A portion of the result report is included below for reference.  ? ?Ann Passy, MS, [place] ?Genetic Counselor ?[name] ?(P) [phone]  ? ? ?

## 2021-07-26 NOTE — Progress Notes (Signed)
Pharmacist Chemotherapy Monitoring - Initial Assessment   ? ?Anticipated start date: 08/02/21  ? ?The following has been reviewed per standard work regarding the patient's treatment regimen: ?The patient's diagnosis, treatment plan and drug doses, and organ/hematologic function ?Lab orders and baseline tests specific to treatment regimen  ?The treatment plan start date, drug sequencing, and pre-medications ?Prior authorization status  ?Patient's documented medication list, including drug-drug interaction screen and prescriptions for anti-emetics and supportive care specific to the treatment regimen ?The drug concentrations, fluid compatibility, administration routes, and timing of the medications to be used ?The patient's access for treatment and lifetime cumulative dose history, if applicable  ?The patient's medication allergies and previous infusion related reactions, if applicable  ? ?Changes made to treatment plan:  ?treatment plan date ? ?Follow up needed:  ?N/A ? ? ?Romualdo Bolk Sarles, [place], ?07/26/2021  11:40 AM ? ?

## 2021-07-26 NOTE — Progress Notes (Signed)
HPI:   ?Ann Maxwell was previously seen in the [facility] due to a personal and family history of cancer and concerns regarding a hereditary predisposition to cancer. Please refer to our prior cancer genetics clinic note for more information regarding our discussion, assessment and recommendations, at the time. Ms. Roderick recent genetic test results were disclosed to her, as were recommendations warranted by these results. These results and recommendations are discussed in more detail below. ? ?CANCER HISTORY:  ?Oncology History  ?Primary cancer of lower outer quadrant of left female breast ([place])  ?01/04/2012 Surgery  ? Left mastectomy: Invasive ductal carcinoma 0.5 cm with high-grade DCIS 9 cm, margins negative, 0/6 lymph nodes, ER 100%, PR 100%, Ki-67 17%, HER-2 negative ratio 1.16 ?  ?02/07/2012 - 05/28/2016 Anti-estrogen oral therapy  ? Anastrozole 1 mg daily (stopped early due to Osteoporosis) ?  ?06/23/2021 Surgery  ? Right mastectomy: Grade 3 IDC with DCIS, 3.4 cm, margins negative, 2/3 lymph nodes positive ER 0%, PR 0%, HER2 negative, Ki-67 20% ?  ?Primary colon cancer ([place])  ?03/20/2014 Initial Diagnosis  ? Invasive adenocarcinoma of [place] ?  ?04/28/2014 Surgery  ? Right hemicolectomy: Invasive adenocarcinoma with mucinous features moderately differentiated invading subserosal tissue, margins negative, 25 lymph nodes negative, T3 N0 M0 stage II a, no microsatellite instability ?  ?Malignant neoplasm of central portion of right breast in female, estrogen receptor negative ([place])  ?06/01/2021 Initial Diagnosis  ? Malignant neoplasm of central portion of right breast in female, estrogen receptor negative ([place]) ?  ?06/01/2021 Cancer Staging  ? Staging form: Breast, AJCC 8th Edition ?- Clinical: Stage IB (cT1c, cN0, cM0, G3, ER-, PR-, HER2-) - Signed by Nicholas Lose, MD on 06/01/2021 ?Histologic grading system: 3 grade system ? ?  ?08/02/2021 -  Chemotherapy  ? Patient is on Treatment Plan : BREAST  Adjuvant CMF IV q21d  ?   ?Cancer of central portion of right breast ([place])  ?06/23/2021 Initial Diagnosis  ? Cancer of central portion of right breast [facility]) ?  ?08/02/2021 -  Chemotherapy  ? Patient is on Treatment Plan : BREAST Adjuvant CMF IV q21d  ?   ? ? ?FAMILY HISTORY:  ?We obtained a detailed, 4-generation family history.  Significant diagnoses are listed below: ?     ?Family History  ?Problem Relation Age of Onset  ? Ovarian cancer Mother 54  ?      deceased 82  ? Uterine cancer Maternal Grandmother    ?      late 82s  ? Liver cancer Paternal Grandfather 12  ?      deceased 18s  ? Thyroid cancer Sister 59  ?      currently 9  ?  ?  ? ?Ms. Forbush's sister was diagnosed with thyroid cancer at age 64. Her mother was diagnosed with ovarian cancer at age 53, she died at age 70. Her maternal grandmother was diagnosed with uterine cancer in her 65s, she is deceased. Ms. Buske father possibly had thyroid cancer, he died at age 71. Her paternal grandfather was diagnosed with liver cancer at age 62 and died in his 5s. ?  ?Ms. Hadsall is unaware of previous family history of genetic testing for hereditary cancer risks. There is no reported Ashkenazi Jewish ancestry. ? ?GENETIC TEST RESULTS:  ?The [facility] CancerNext-Expanded Panel found no pathogenic mutations.  ? ?The CancerNext-Expanded gene panel offered by [facility] and includes sequencing, rearrangement, and RNA analysis for the following 77 genes: AIP, ALK,  APC, ATM, AXIN2, BAP1, BARD1, BLM, BMPR1A, BRCA1, BRCA2, BRIP1, CDC73, CDH1, CDK4, CDKN1B, CDKN2A, CHEK2, CTNNA1, DICER1, FANCC, FH, FLCN, GALNT12, KIF1B, LZTR1, MAX, MEN1, MET, MLH1, MSH2, MSH3, MSH6, MUTYH, NBN, NF1, NF2, NTHL1, PALB2, PHOX2B, PMS2, POT1, PRKAR1A, PTCH1, PTEN, RAD51C, RAD51D, RB1, RECQL, RET, SDHA, SDHAF2, SDHB, SDHC, SDHD, SMAD4, SMARCA4, SMARCB1, SMARCE1, STK11, SUFU, TMEM127, TP53, TSC1, TSC2, VHL and XRCC2 (sequencing and deletion/duplication); EGFR, EGLN1, HOXB13, KIT, MITF, PDGFRA, POLD1,  and POLE (sequencing only); EPCAM and GREM1 (deletion/duplication only).  ? ?The test report has been scanned into EPIC and is located under the Molecular Pathology section of the Results Review tab.  A portion of the result report is included below for reference. Genetic testing reported out on 07/26/2021.  ? ? ? ? ? ? ?Even though a pathogenic variant was not identified, possible explanations for her personal history of cancer may include: ?There may be no hereditary risk for cancer in the family. The cancers in Ms. Nova and/or her family may be due to other genetic or environmental factors. ?There may be a gene mutation in one of these genes that current testing methods cannot detect, but that chance is small. ?There could be another gene that has not yet been discovered, or that we have not yet tested, that is responsible for the cancer diagnoses in the family.  ? ?Therefore, it is important to remain in touch with cancer genetics in the future so that we can continue to offer Ms. Weidemann the most up to date genetic testing.   ? ?ADDITIONAL GENETIC TESTING:  ?We discussed with Ms. Meisenheimer that her genetic testing was fairly extensive.  If there are genes identified to increase cancer risk that can be analyzed in the future, we would be happy to discuss and coordinate this testing at that time.   ? ?CANCER SCREENING RECOMMENDATIONS:  ?Ms. Kun's test result is considered negative (normal).  This means that we have not identified a hereditary cause for her personal and family history of cancer at this time. Most cancers happen by chance and this negative test suggests that her cancer may fall into this category.   ? ?An individual's cancer risk and medical management are not determined by genetic test results alone. Overall cancer risk assessment incorporates additional factors, including personal medical history, family history, and any available genetic information that may result in a personalized plan for cancer  prevention and surveillance. Therefore, it is recommended she continue to follow the cancer management and screening guidelines provided by her oncology and primary healthcare provider. ? ?RECOMMENDATIONS FOR FAMILY MEMBERS:   ?Since she did not inherit a mutation in a cancer predisposition gene included on this panel, her daughter could not have inherited a mutation from her in one of these genes. ?We recommend women in this family have a yearly mammogram beginning at age 30, or 18 years younger than the earliest onset of cancer, an annual clinical breast exam, and perform monthly breast self-exams. ?Her daughter is recommended to repeat colonoscopies at least every 5 years. More frequent colonoscopies may be recommended if polyps are identified.   ? ?FOLLOW-UP:  ?Cancer genetics is a rapidly advancing field and it is possible that new genetic tests will be appropriate for her and/or her family members in the future. We encouraged her to remain in contact with cancer genetics on an annual basis so we can update her personal and family histories and let her know of advances in cancer genetics that may benefit this family.  ? ?  Our contact number was provided. Ms. Hagwood questions were answered to her satisfaction, and she knows she is welcome to call us at anytime with additional questions or concerns.  ? ?Lucille Passy, MS, [place] ?Genetic Counselor ?[email or web address] ?(P) [phone] ? ? ?

## 2021-07-27 ENCOUNTER — Encounter: Payer: Self-pay | Admitting: Genetic Counselor

## 2021-08-01 ENCOUNTER — Ambulatory Visit: Payer: Medicare PPO

## 2021-08-01 DIAGNOSIS — Z9011 Acquired absence of right breast and nipple: Secondary | ICD-10-CM

## 2021-08-01 DIAGNOSIS — Z95828 Presence of other vascular implants and grafts: Secondary | ICD-10-CM

## 2021-08-01 DIAGNOSIS — M25511 Pain in right shoulder: Secondary | ICD-10-CM | POA: Diagnosis not present

## 2021-08-01 DIAGNOSIS — M25611 Stiffness of right shoulder, not elsewhere classified: Secondary | ICD-10-CM

## 2021-08-01 DIAGNOSIS — M25512 Pain in left shoulder: Secondary | ICD-10-CM | POA: Diagnosis not present

## 2021-08-01 DIAGNOSIS — R293 Abnormal posture: Secondary | ICD-10-CM | POA: Diagnosis not present

## 2021-08-01 HISTORY — DX: Presence of other vascular implants and grafts: Z95.828

## 2021-08-01 NOTE — Therapy (Signed)
?OUTPATIENT PHYSICAL THERAPY TREATMENT NOTE ? ? ?Patient Name: Ann Maxwell ?MRN: [id number] ?DOB:11/20/1940, 81 y.o., female ?Today's Date: 08/01/2021 ? ?PCP: Donnajean Lopes, MD ?REFERRING PROVIDER: Stark Klein, MD ? ?END OF SESSION:  ? PT End of Session - 08/01/21 1003   ? ? Visit Number 3   ? Number of Visits 12   ? Date for PT Re-Evaluation 08/30/21   ? Authorization Type [employer]   ? PT Start Time 0911   ? PT Stop Time [phone]   ? PT Time Calculation (min) 47 min   ? Activity Tolerance Patient tolerated treatment well   ? Behavior During Therapy [facility] for tasks assessed/performed   ? ?  ?  ? ?  ? ? ? ?Past Medical History:  ?Diagnosis Date  ? Allergy   ? Anemia   ? iron infusion -last done 1 month ago(12-15, and 12-22 -15 [facility])- Dr. Lindi Adie  ? Anxiety   ? Asthma   ? Breast CA ([place]) 2013  ? a. L breast DCIS, s/p L mastectomy 12/2011. no further issues  ? Breast cancer ([employer]) 2023  ? Right breast IDC  ? Cataract   ? bilat removed   ? Colon cancer ([place])   ? Depression   ? pt denies any hx of depression   ? Environmental allergies   ? History of kidney stones   ? Hypertension   ? controlled on meds   ? Internal hemorrhoids   ? remains an issue- no bright bleeding seen recent  ? Iron deficiency anemia   ? Obesity   ? Osteoarthritis   ? arthritis- knees and shoulders  ? Osteoporosis   ? drug induced and on Fosamax  ? Transfusion history   ? 2 yrs ago due to anemia  ? Vertigo   ? no recent issues  ? ?Past Surgical History:  ?Procedure Laterality Date  ? BREAST BIOPSY Right 05/27/2021  ? U/S Bx  ? BREAST SURGERY    ? CATARACT EXTRACTION, BILATERAL  2019  ? CHOLECYSTECTOMY    ? COLON RESECTION N/A 04/28/2014  ? Procedure: LAPAROSCOPIC  RIGHT HEMI-COLECTOMY;  Surgeon: Stark Klein, MD;  Location: [facility];  Service: General;  Laterality: N/A;  ? COLONOSCOPY    ? Lithectomy  2010  ? LITHOTRIPSY    ? MASTECTOMY Left 2013  ? MASTECTOMY W/ SENTINEL NODE BIOPSY  01/04/2012  ? Procedure: MASTECTOMY WITH SENTINEL LYMPH NODE BIOPSY;   Surgeon: Stark Klein, MD;  Location: [facility];  Service: General;  Laterality: Left;  ? ORIF TIBIA & FIBULA FRACTURES    ? wtih subsequent pin removal in 1997  ? ORIF ULNAR / RADIAL SHAFT FRACTURE Left   ? POLYPECTOMY    ? PORTACATH PLACEMENT Left 06/23/2021  ? Procedure: INSERTION PORT-A-CATH;  Surgeon: Stark Klein, MD;  Location: [facility];  Service: General;  Laterality: Left;  ? SENTINEL NODE BIOPSY Right 06/23/2021  ? Procedure: SENTINEL LYMPH NODE BIOPSY;  Surgeon: Stark Klein, MD;  Location: [facility];  Service: General;  Laterality: Right;  ? SIMPLE MASTECTOMY WITH AXILLARY SENTINEL NODE BIOPSY Right 06/23/2021  ? Procedure: RIGHT MASTECTOMY;  Surgeon: Stark Klein, MD;  Location: [facility];  Service: General;  Laterality: Right;  ? TONSILLECTOMY AND ADENOIDECTOMY    ? TOTAL KNEE ARTHROPLASTY Left 08/23/2015  ? Procedure: TOTAL KNEE ARTHROPLASTY;  Surgeon: Gaynelle Arabian, MD;  Location: [facility];  Service: Orthopedics;  Laterality: Left;  ? VAGINAL HYSTERECTOMY  20 yrs. ago  ?  with BSO  ? ?Patient Active Problem List  ? Diagnosis Date Noted  ? Genetic testing 07/26/2021  ? Family history of ovarian cancer 07/15/2021  ? Cancer of central portion of right breast ([place]) 06/23/2021  ? Malignant neoplasm of central portion of right breast in female, estrogen receptor negative ([place]) 06/01/2021  ? Pain in right knee 06/12/2019  ? Asthma 10/17/2017  ? OA (osteoarthritis) of knee 08/23/2015  ? Primary colon cancer ([place]) 03/31/2014  ? Iron deficiency anemia due to chronic blood loss 07/01/2013  ? Sinus tachycardia 09/12/2012  ? Mobitz type 1 second degree AV block 09/12/2012  ? Chest pain 09/11/2012  ? HTN (hypertension) 09/11/2012  ? Asthma, chronic 09/11/2012  ? Primary cancer of lower outer quadrant of left female breast ([place]) 12/12/2011  ? ? ?REFERRING DIAG: s/p Right mastectomy ? ?THERAPY DIAG:  ?Status post right mastectomy ? ?Abnormal posture ? ?Stiffness of right  shoulder, not elsewhere classified ? ?Bilateral shoulder pain, unspecified chronicity ? ?PERTINENT HISTORY:  ?06/23/2021 Surgery Right mastectomy: Grade 3 IDC with DCIS, 3.4 cm, margins negative, 2/3 lymph nodes positive ER 0%, PR 0%, HER2 negative, Ki-67 20%  ?  ?01/04/2012 Surgery  ?  Left mastectomy: Invasive ductal carcinoma 0.5 cm with high-grade DCIS 9 cm, margins negative, 0/6 lymph nodes, ER 100%, PR 100%, Ki-67 17%, HER-2   ?Pt also has a history of surgery for Colon cancer in 2016 ? ? ?PRECAUTIONS: Other: Lymphedema risk bilaterally, Right knee pain , HBP ?  ? ?SUBJECTIVE:  I am nervous because I start chemo tomorrow.  I get some left shoulder pain that lingers for a minute or so after exercises. It seems to be calming down ?The pad stayed in but I was still having discomfort from the swelling and trying to wear the compression bra.  They withdrew 40 cc's from the area. My sternum is still tender.  Shoulder ROM seems to be doing better. ?PAIN:  ?Are you having pain? No, discomfort ? ? ? ?OBJECTIVE ?  ?COGNITION: ?           Overall cognitive status: Within functional limits for tasks assessed  ?  ?PALPATION: Soft swelling inferior to right mastectomy incision ?  ?OBSERVATIONS / OTHER ASSESSMENTS: incision healing well. Glue still present over mastectomy incision, drain incision still not fully healed. Swelling noted around incision ?  ?SENSATION: ?           Light touch: Appears intact, not yet normal on right ?            ?  ?POSTURE: forward head, rounded shoulders ?  ?UPPER EXTREMITY AROM/PROM: ?  ?A/PROM RIGHT  07/18/2021 ?  08/01/2021  ?Shoulder extension 57   ?Shoulder flexion 112 pain with lowering 120 with pop  ?Shoulder abduction 76 pulls in chest 102  ?Shoulder internal rotation     ?Shoulder external rotation     ?                        (Blank rows = not tested) ?  ?A/PROM LEFT  07/18/2021  ?Shoulder extension 62  ?Shoulder flexion 152  ?Shoulder abduction 152  ?Shoulder internal rotation    ?Shoulder  external rotation 90  ?                        (Blank rows = not tested) ?  ?  ?CERVICAL AROM: ?All within functional limits: left rotation limited slightly more  than right, C6 disc ?  ?  ?  ?  ?UPPER EXTREMITY STRENGTH: NT on right, WFL on left ?  ?  ?  ?LYMPHEDEMA ASSESSMENTS:  ?  ?SURGERY TYPE/DATE: Right Mastectomy with SLNB, 06/23/2021 , Left Mastectomy with SLNB 01/04/2012 ?  ?NUMBER OF LYMPH NODES REMOVED: Right 2+/3, left 0/6 ?  ?CHEMOTHERAPY: pending ?  ?RADIATION:pending ?  ?HORMONE TREATMENT: prior hormone treatment ?  ?INFECTIONS: no ?  ?LYMPHEDEMA ASSESSMENTS:  ?  ?[place] RIGHT  07/18/2021  ?10 cm proximal to olecranon process 37.4 (from front of elbow)  ?Olecranon process 29.0  ?10 cm proximal to ulnar styloid process 24.5  ?Just proximal to ulnar styloid process 19.5  ?Across hand at thumb web space 19.8  ?At base of 2nd digit 6.3  ?(Blank rows = not tested) ?  ?[place] LEFT  07/18/2021  ?10 cm proximal to olecranon process 38.6 front of elbow  ?Olecranon process 30.3  ?10 cm proximal to ulnar styloid process 24.8  ?Just proximal to ulnar styloid process 19.9  ?Across hand at thumb web space 19.7  ?At base of 2nd digit 6.3  ?(Blank rows = not tested) ?  ?  ?  ?  ?  ?QUICK DASH SURVEY: 36.36% ?  ?  ?TODAY'S TREATMENT  ? ?08/01/2021 ?MANUAL: soft tissue mobilization to bilateral UT, right pectorals, lats in supine and in left SL to right scapular area ?Gentle PROM to right shoulder flexion, scaption, abd, ER,  ?MLD to supraclavicular,right inguinal LN's,5 diaphragmatic breaths, right axillo-inguinal pathway and right chest directing to pathway and repeating all steps to decrease swelling. ?Remeasured AROM flexion and abduction ?THERAPEUTIC EXERCISE;AROM bilateral flexion, scaption and horizontal abduction x 5 ? ? ? ? ?07/21/2021 ?MANUAL: soft tissue mobilization to bilateral UT, right pectorals, lats in supine and in left SL to right scapular area ?Gentle PROM to right shoulder flexion, scaption, abd, ER,  IR. ?THERAPEUTIC EXERCISE; Reviewed supine clasped hands flexion and supine ER. Tried supine wand flexion and scaption but held secondary to crepitus in left shoulder that was uncomfortable. Tried abduction wa

## 2021-08-02 ENCOUNTER — Inpatient Hospital Stay: Payer: Medicare PPO

## 2021-08-02 ENCOUNTER — Inpatient Hospital Stay: Payer: Medicare PPO | Attending: Hematology and Oncology

## 2021-08-02 ENCOUNTER — Encounter: Payer: Self-pay | Admitting: *Deleted

## 2021-08-02 ENCOUNTER — Other Ambulatory Visit: Payer: Self-pay

## 2021-08-02 ENCOUNTER — Inpatient Hospital Stay: Payer: Medicare PPO | Admitting: Hematology and Oncology

## 2021-08-02 DIAGNOSIS — Z171 Estrogen receptor negative status [ER-]: Secondary | ICD-10-CM

## 2021-08-02 DIAGNOSIS — Z79899 Other long term (current) drug therapy: Secondary | ICD-10-CM | POA: Diagnosis not present

## 2021-08-02 DIAGNOSIS — C50111 Malignant neoplasm of central portion of right female breast: Secondary | ICD-10-CM | POA: Insufficient documentation

## 2021-08-02 DIAGNOSIS — Z79811 Long term (current) use of aromatase inhibitors: Secondary | ICD-10-CM | POA: Diagnosis not present

## 2021-08-02 DIAGNOSIS — C50512 Malignant neoplasm of lower-outer quadrant of left female breast: Secondary | ICD-10-CM | POA: Diagnosis not present

## 2021-08-02 DIAGNOSIS — Z5111 Encounter for antineoplastic chemotherapy: Secondary | ICD-10-CM | POA: Insufficient documentation

## 2021-08-02 DIAGNOSIS — Z95828 Presence of other vascular implants and grafts: Secondary | ICD-10-CM

## 2021-08-02 DIAGNOSIS — D500 Iron deficiency anemia secondary to blood loss (chronic): Secondary | ICD-10-CM

## 2021-08-02 LAB — CBC WITH DIFFERENTIAL (CANCER CENTER ONLY)
Abs Immature Granulocytes: 0.09 10*3/uL — ABNORMAL HIGH (ref 0.00–0.07)
Basophils Absolute: 0.1 10*3/uL (ref 0.0–0.1)
Basophils Relative: 1 %
Eosinophils Absolute: 0.3 10*3/uL (ref 0.0–0.5)
Eosinophils Relative: 4 %
HCT: 43.4 % (ref 36.0–46.0)
Hemoglobin: 14.4 g/dL (ref 12.0–15.0)
Immature Granulocytes: 1 %
Lymphocytes Relative: 12 %
Lymphs Abs: 1 10*3/uL (ref 0.7–4.0)
MCH: 29.4 pg (ref 26.0–34.0)
MCHC: 33.2 g/dL (ref 30.0–36.0)
MCV: 88.6 fL (ref 80.0–100.0)
Monocytes Absolute: 0.7 10*3/uL (ref 0.1–1.0)
Monocytes Relative: 8 %
Neutro Abs: 6.4 10*3/uL (ref 1.7–7.7)
Neutrophils Relative %: 74 %
Platelet Count: 234 10*3/uL (ref 150–400)
RBC: 4.9 MIL/uL (ref 3.87–5.11)
RDW: 13.1 % (ref 11.5–15.5)
WBC Count: 8.5 10*3/uL (ref 4.0–10.5)
nRBC: 0 % (ref 0.0–0.2)

## 2021-08-02 LAB — CMP (CANCER CENTER ONLY)
ALT: 17 U/L (ref 0–44)
AST: 15 U/L (ref 15–41)
Albumin: 4 g/dL (ref 3.5–5.0)
Alkaline Phosphatase: 69 U/L (ref 38–126)
Anion gap: 8 (ref 5–15)
BUN: 15 mg/dL (ref 8–23)
CO2: 28 mmol/L (ref 22–32)
Calcium: 9.2 mg/dL (ref 8.9–10.3)
Chloride: 101 mmol/L (ref 98–111)
Creatinine: 0.67 mg/dL (ref 0.44–1.00)
GFR, Estimated: >60 mL/min (ref >60)
Glucose, Bld: 127 mg/dL — ABNORMAL HIGH (ref 70–99)
Potassium: 3.3 mmol/L — ABNORMAL LOW (ref 3.5–5.1)
Sodium: 137 mmol/L (ref 135–145)
Total Bilirubin: 0.5 mg/dL (ref 0.3–1.2)
Total Protein: 6.9 g/dL (ref 6.5–8.1)

## 2021-08-02 MED ORDER — ALPRAZOLAM 0.25 MG PO TABS: 0.2500 mg | ORAL_TABLET | Freq: Every evening | ORAL | 0 refills | Status: DC | PRN

## 2021-08-02 MED ORDER — SODIUM CHLORIDE 0.9% FLUSH
10.0000 mL | INTRAVENOUS | Status: DC | PRN
  Administered 2021-08-02: 10 mL

## 2021-08-02 MED ORDER — METHOTREXATE SODIUM (PF) CHEMO INJECTION 250 MG/10ML
40.0000 mg/m2 | Freq: Once | INTRAMUSCULAR | Status: AC
  Administered 2021-08-02: 81.25 mg via INTRAVENOUS
  Filled 2021-08-02: qty 3.25

## 2021-08-02 MED ORDER — SODIUM CHLORIDE 0.9% FLUSH
10.0000 mL | Freq: Once | INTRAVENOUS | Status: AC
  Administered 2021-08-02: 10 mL

## 2021-08-02 MED ORDER — SODIUM CHLORIDE 0.9 % IV SOLN
10.0000 mg | Freq: Once | INTRAVENOUS | Status: AC
  Administered 2021-08-02: 10 mg via INTRAVENOUS
  Filled 2021-08-02: qty 10

## 2021-08-02 MED ORDER — FLUOROURACIL CHEMO INJECTION 2.5 GM/50ML
600.0000 mg/m2 | Freq: Once | INTRAVENOUS | Status: AC
  Administered 2021-08-02: 1200 mg via INTRAVENOUS
  Filled 2021-08-02: qty 24

## 2021-08-02 MED ORDER — SODIUM CHLORIDE 0.9 % IV SOLN: Freq: Once | INTRAVENOUS | Status: AC

## 2021-08-02 MED ORDER — HYDROCORTISONE (PERIANAL) 2.5 % EX CREA: 1.0000 "application " | TOPICAL_CREAM | Freq: Two times a day (BID) | CUTANEOUS | 1 refills | Status: DC

## 2021-08-02 MED ORDER — PALONOSETRON HCL INJECTION 0.25 MG/5ML
0.2500 mg | Freq: Once | INTRAVENOUS | Status: AC
  Administered 2021-08-02: 0.25 mg via INTRAVENOUS
  Filled 2021-08-02: qty 5

## 2021-08-02 MED ORDER — SODIUM CHLORIDE 0.9 % IV SOLN
600.0000 mg/m2 | Freq: Once | INTRAVENOUS | Status: AC
  Administered 2021-08-02: 1220 mg via INTRAVENOUS
  Filled 2021-08-02: qty 61

## 2021-08-02 MED ORDER — HEPARIN SOD (PORK) LOCK FLUSH 100 UNIT/ML IV SOLN
500.0000 [IU] | Freq: Once | INTRAVENOUS | Status: AC | PRN
  Administered 2021-08-02: 500 [IU]

## 2021-08-02 NOTE — Patient Instructions (Signed)
[facility]  Discharge Instructions: ?Thank you for choosing [facility] to provide your oncology and hematology care.  ? ?If you have a lab appointment with the [facility], please go directly to the [facility] and check in at the registration area. ?  ?Wear comfortable clothing and clothing appropriate for easy access to any Portacath or PICC line.  ? ?We strive to give you quality time with your provider. You may need to reschedule your appointment if you arrive late (15 or more minutes).  Arriving late affects you and other patients whose appointments are after yours.  Also, if you miss three or more appointments without notifying the office, you may be dismissed from the clinic at the provider?s discretion.    ?  ?For prescription refill requests, have your pharmacy contact our office and allow 72 hours for refills to be completed.   ? ?Today you received the following chemotherapy and/or immunotherapy agents cyclophosphamide, methotrexate, fluorouracil    ?  ?To help prevent nausea and vomiting after your treatment, we encourage you to take your nausea medication as directed. ? ?BELOW ARE SYMPTOMS THAT SHOULD BE REPORTED IMMEDIATELY: ?*FEVER GREATER THAN 100.4 F (38 ?C) OR HIGHER ?*CHILLS OR SWEATING ?*NAUSEA AND VOMITING THAT IS NOT CONTROLLED WITH YOUR NAUSEA MEDICATION ?*UNUSUAL SHORTNESS OF BREATH ?*UNUSUAL BRUISING OR BLEEDING ?*URINARY PROBLEMS (pain or burning when urinating, or frequent urination) ?*BOWEL PROBLEMS (unusual diarrhea, constipation, pain near the anus) ?TENDERNESS IN MOUTH AND THROAT WITH OR WITHOUT PRESENCE OF ULCERS (sore throat, sores in mouth, or a toothache) ?UNUSUAL RASH, SWELLING OR PAIN  ?UNUSUAL VAGINAL DISCHARGE OR ITCHING  ? ?Items with * indicate a potential emergency and should be followed up as soon as possible or go to the Emergency Department if any problems should occur. ? ?Please show the CHEMOTHERAPY ALERT CARD or  IMMUNOTHERAPY ALERT CARD at check-in to the Emergency Department and triage nurse. ? ?Should you have questions after your visit or need to cancel or reschedule your appointment, please contact [facility]  Dept: [phone]  and follow the prompts.  Office hours are 8:00 a.m. to 4:30 p.m. Monday - Friday. Please note that voicemails left after 4:00 p.m. may not be returned until the following business day.  We are closed weekends and major holidays. You have access to a nurse at all times for urgent questions. Please call the main number to the clinic Dept: [phone] and follow the prompts. ? ? ?For any non-urgent questions, you may also contact your provider using MyChart. We now offer e-Visits for anyone 73 and older to request care online for non-urgent symptoms. For details visit [email or web address]. ?  ?Also download the MyChart app! Go to the app store, search "MyChart", open the app, select [facility], and log in with your MyChart username and password. ? ?Due to Covid, a mask is required upon entering the hospital/clinic. If you do not have a mask, one will be given to you upon arrival. For doctor visits, patients may have 1 support person aged 79 or older with them. For treatment visits, patients cannot have anyone with them due to current Covid guidelines and our immunocompromised population.  ? ?Cyclophosphamide Injection ?What is this medication? ?CYCLOPHOSPHAMIDE (sye kloe FOSS fa mide) is a chemotherapy drug. It slows the growth of cancer cells. This medicine is used to treat many types of cancer like lymphoma, myeloma, leukemia, breast cancer, and ovarian cancer, to name a few. ?This  medicine may be used for other purposes; ask your health care provider or pharmacist if you have questions. ?COMMON BRAND NAME(S): Cyclophosphamide, Cytoxan, Neosar ?What should I tell my care team before I take this medication? ?They need to know if you have any of these  conditions: ?heart disease ?history of irregular heartbeat ?infection ?kidney disease ?liver disease ?low blood counts, like white cells, platelets, or red blood cells ?on hemodialysis ?recent or ongoing radiation therapy ?scarring or thickening of the lungs ?trouble passing urine ?an unusual or allergic reaction to cyclophosphamide, other medicines, foods, dyes, or preservatives ?pregnant or trying to get pregnant ?breast-feeding ?How should I use this medication? ?This drug is usually given as an injection into a vein or muscle or by infusion into a vein. It is administered in a hospital or clinic by a specially trained health care professional. ?Talk to your pediatrician regarding the use of this medicine in children. Special care may be needed. ?Overdosage: If you think you have taken too much of this medicine contact a poison control center or emergency room at once. ?NOTE: This medicine is only for you. Do not share this medicine with others. ?What if I miss a dose? ?It is important not to miss your dose. Call your doctor or health care professional if you are unable to keep an appointment. ?What may interact with this medication? ?amphotericin B ?azathioprine ?certain antivirals for HIV or hepatitis ?certain medicines for blood pressure, heart disease, irregular heart beat ?certain medicines that treat or prevent blood clots like warfarin ?certain other medicines for cancer ?cyclosporine ?etanercept ?indomethacin ?medicines that relax muscles for surgery ?medicines to increase blood counts ?metronidazole ?This list may not describe all possible interactions. Give your health care provider a list of all the medicines, herbs, non-prescription drugs, or dietary supplements you use. Also tell them if you smoke, drink alcohol, or use illegal drugs. Some items may interact with your medicine. ?What should I watch for while using this medication? ?Your condition will be monitored carefully while you are receiving  this medicine. ?You may need blood work done while you are taking this medicine. ?Drink water or other fluids as directed. Urinate often, even at night. ?Some products may contain alcohol. Ask your health care professional if this medicine contains alcohol. Be sure to tell all health care professionals you are taking this medicine. Certain medicines, like metronidazole and disulfiram, can cause an unpleasant reaction when taken with alcohol. The reaction includes flushing, headache, nausea, vomiting, sweating, and increased thirst. The reaction can last from 30 minutes to several hours. ?Do not become pregnant while taking this medicine or for 1 year after stopping it. Women should inform their health care professional if they wish to become pregnant or think they might be pregnant. Men should not father a child while taking this medicine and for 4 months after stopping it. There is potential for serious side effects to an unborn child. Talk to your health care professional for more information. ?Do not breast-feed an infant while taking this medicine or for 1 week after stopping it. ?This medicine has caused ovarian failure in some women. This medicine may make it more difficult to get pregnant. Talk to your health care professional if you are concerned about your fertility. ?This medicine has caused decreased sperm counts in some men. This may make it more difficult to father a child. Talk to your health care professional if you are concerned about your fertility. ?Call your health care professional for advice if you  get a fever, chills, or sore throat, or other symptoms of a cold or flu. Do not treat yourself. This medicine decreases your body's ability to fight infections. Try to avoid being around people who are sick. ?Avoid taking medicines that contain aspirin, acetaminophen, ibuprofen, naproxen, or ketoprofen unless instructed by your health care professional. These medicines may hide a fever. ?Talk to your  health care professional about your risk of cancer. You may be more at risk for certain types of cancer if you take this medicine. ?If you are going to need surgery or other procedure, tell your health care professiona

## 2021-08-02 NOTE — Assessment & Plan Note (Signed)
Left breast cancer invasive ductal carcinoma: 0.5 cm with 9 mm area of DCIS status post left mastectomy 01/04/2012 ER/PR positive HER-2 negative Ki-67 17% currently on Arimidex 1 mg daily ?Since 02/07/2012.??Completed 7 years of October 2020 ??(cecal adenocarcinoma diagnosed 04/22/2013, preop CEA of 5.1, status post right hemicolectomy 04/28/2014, grade 2, no MVI, 0/25 lymph nodes, subserosal involvement, T3 N0 M0 stage II A, no mismatch repair) ?? ?Contralateral breast cancer: ?05/19/2021: Right breast mammogram and ultrasound: 1.6 cm mass in the right breast 12 o'clock position with architectural distortion highly suspicious for malignancy, no axillary lymph nodes, biopsy: Grade 3 IDC ER 0%, PR 0%, HER2 negative 1+ by IHC, Ki-67 20%  ?? ?06/23/2021:Right mastectomy: Grade 3 IDC with DCIS, 3.4 cm, margins negative, 2/3 lymph nodes positive ER 0%, PR 0%, HER2 negative, Ki-67 20% ??? ?CT CAP and bone scan: 06/08/2021: Multiple small lung nodules stable with the exception of new 4 mm left lower lobe lung nodule and a 6 mm right lower lobe lung nodule open (nonspecific) ?? ?Treatment plan: ?1. Adjuvant chemotherapy with CMF x6 cycles ?2. Adjuvant radiation ?------------------------------------------------------------------------------------------------------- ?Current treatment: Cycle 1 day 1 CMF ?Chemo consent obtained, chemo education completed, antiemetics were reviewed, labs were reviewed ?Return to clinic in 1 week for toxicity check ? ?

## 2021-08-03 ENCOUNTER — Telehealth: Payer: Self-pay

## 2021-08-03 NOTE — Telephone Encounter (Signed)
-----   Message from Alvera Singh, RN sent at 08/02/2021  1:35 PM EDT ----- ?Regarding: First time CMF Dr. Lindi Adie ?Patient received first time CMF on 08/02/21 and tolerated it well. Patient tolerated well. ? ?

## 2021-08-03 NOTE — Telephone Encounter (Signed)
Ann Maxwell felt queasy last evening and this am.  She used the compazine with good effect. She is drinking fluids and urinating well. She is eating lightly today with dried fruit and an apple with peanut butter.  She has not moved her bowels today. She had a good BM yesterday. ?She will take a capful of miralax later today if no BM. She usually hast a BM bid. ?She knows to call the office at [phone] if she has any questions or concerns. ?

## 2021-08-05 ENCOUNTER — Ambulatory Visit: Payer: Medicare PPO

## 2021-08-05 DIAGNOSIS — M25512 Pain in left shoulder: Secondary | ICD-10-CM | POA: Diagnosis not present

## 2021-08-05 DIAGNOSIS — M25611 Stiffness of right shoulder, not elsewhere classified: Secondary | ICD-10-CM | POA: Diagnosis not present

## 2021-08-05 DIAGNOSIS — Z9011 Acquired absence of right breast and nipple: Secondary | ICD-10-CM

## 2021-08-05 DIAGNOSIS — R293 Abnormal posture: Secondary | ICD-10-CM

## 2021-08-05 DIAGNOSIS — M25511 Pain in right shoulder: Secondary | ICD-10-CM | POA: Diagnosis not present

## 2021-08-05 NOTE — Therapy (Signed)
?OUTPATIENT PHYSICAL THERAPY TREATMENT NOTE ? ? ?Patient Name: Ann Maxwell ?MRN: [id number] ?DOB:[birth year], 81 y.o., female ?Today's Date: 08/05/2021 ? ?PCP: Donnajean Lopes, MD ?REFERRING PROVIDER: Stark Klein, MD ? ?END OF SESSION:  ? PT End of Session - 08/05/21 1004   ? ? Visit Number 4   ? Number of Visits 12   ? Date for PT Re-Evaluation 08/30/21   ? Authorization Type [employer]   ? Authorization Time Period 07/19/2021-08/30/2021   ? Authorization - Visit Number 4   ? Authorization - Number of Visits 12   ? PT Start Time 1006   ? PT Stop Time 1050   ? PT Time Calculation (min) 44 min   ? Activity Tolerance Patient tolerated treatment well   ? Behavior During Therapy [facility] for tasks assessed/performed   ? ?  ?  ? ?  ? ? ? ?Past Medical History:  ?Diagnosis Date  ? Allergy   ? Anemia   ? iron infusion -last done 1 month ago(12-15, and 12-22 -5 [facility])- Dr. Lindi Adie  ? Anxiety   ? Asthma   ? Breast CA ([place]) 2013  ? a. L breast DCIS, s/p L mastectomy 12/2011. no further issues  ? Breast cancer ([place]) 2023  ? Right breast IDC  ? Cataract   ? bilat removed   ? Colon cancer ([place])   ? Depression   ? pt denies any hx of depression   ? Environmental allergies   ? History of kidney stones   ? Hypertension   ? controlled on meds   ? Internal hemorrhoids   ? remains an issue- no bright bleeding seen recent  ? Iron deficiency anemia   ? Obesity   ? Osteoarthritis   ? arthritis- knees and shoulders  ? Osteoporosis   ? drug induced and on Fosamax  ? Transfusion history   ? 2 yrs ago due to anemia  ? Vertigo   ? no recent issues  ? ?Past Surgical History:  ?Procedure Laterality Date  ? BREAST BIOPSY Right 05/27/2021  ? U/S Bx  ? BREAST SURGERY    ? CATARACT EXTRACTION, BILATERAL  2019  ? CHOLECYSTECTOMY    ? COLON RESECTION N/A 04/28/2014  ? Procedure: LAPAROSCOPIC  RIGHT HEMI-COLECTOMY;  Surgeon: Stark Klein, MD;  Location: [facility];  Service: General;  Laterality: N/A;  ? COLONOSCOPY    ? Lithectomy  2010  ? LITHOTRIPSY    ?  MASTECTOMY Left 2013  ? MASTECTOMY W/ SENTINEL NODE BIOPSY  01/04/2012  ? Procedure: MASTECTOMY WITH SENTINEL LYMPH NODE BIOPSY;  Surgeon: Stark Klein, MD;  Location: [place];  Service: General;  Laterality: Left;  ? ORIF TIBIA & FIBULA FRACTURES    ? wtih subsequent pin removal in 1997  ? ORIF ULNAR / RADIAL SHAFT FRACTURE Left   ? POLYPECTOMY    ? PORTACATH PLACEMENT Left 06/23/2021  ? Procedure: INSERTION PORT-A-CATH;  Surgeon: Stark Klein, MD;  Location: [place];  Service: General;  Laterality: Left;  ? SENTINEL NODE BIOPSY Right 06/23/2021  ? Procedure: SENTINEL LYMPH NODE BIOPSY;  Surgeon: Stark Klein, MD;  Location: [place];  Service: General;  Laterality: Right;  ? SIMPLE MASTECTOMY WITH AXILLARY SENTINEL NODE BIOPSY Right 06/23/2021  ? Procedure: RIGHT MASTECTOMY;  Surgeon: Stark Klein, MD;  Location: [place];  Service: General;  Laterality: Right;  ? TONSILLECTOMY AND ADENOIDECTOMY    ? TOTAL KNEE ARTHROPLASTY Left 08/23/2015  ? Procedure: TOTAL KNEE ARTHROPLASTY;  Surgeon: Gaynelle Arabian, MD;  [facility];  Service: Orthopedics;  Laterality: Left;  ? VAGINAL HYSTERECTOMY  20 yrs. ago  ? with BSO  ? ?Patient Active Problem List  ? Diagnosis Date Noted  ? Port-A-Cath in place 08/01/2021  ? Genetic testing 07/26/2021  ? Family history of ovarian cancer 07/15/2021  ? Cancer of central portion of right breast ([place]) 06/23/2021  ? Malignant neoplasm of central portion of right breast in female, estrogen receptor negative ([place]) 06/01/2021  ? Pain in right knee 06/12/2019  ? Asthma 10/17/2017  ? OA (osteoarthritis) of knee 08/23/2015  ? Primary colon cancer ([place]) 03/31/2014  ? Iron deficiency anemia due to chronic blood loss 07/01/2013  ? Sinus tachycardia 09/12/2012  ? Mobitz type 1 second degree AV block 09/12/2012  ? Chest pain 09/11/2012  ? HTN (hypertension) 09/11/2012  ? Asthma, chronic 09/11/2012  ? Primary cancer of lower outer quadrant of left  female breast ([place]) 12/12/2011  ? ? ?REFERRING DIAG: s/p Right mastectomy ? ?THERAPY DIAG:  ?Status post right mastectomy ? ?Abnormal posture ? ?Stiffness of right shoulder, not elsewhere classified ? ?Bilateral shoulder pain, unspecified chronicity ? ?PERTINENT HISTORY:  ?06/23/2021 Surgery Right mastectomy: Grade 3 IDC with DCIS, 3.4 cm, margins negative, 2/3 lymph nodes positive ER 0%, PR 0%, HER2 negative, Ki-67 20%  ?  ?01/04/2012 Surgery  ?  Left mastectomy: Invasive ductal carcinoma 0.5 cm with high-grade DCIS 9 cm, margins negative, 0/6 lymph nodes, ER 100%, PR 100%, Ki-67 17%, HER-2   ?Pt also has a history of surgery for Colon cancer in 2016 ? ? ?PRECAUTIONS: Other: Lymphedema risk bilaterally, Right knee pain , HBP ?  ? ?SUBJECTIVE:  Had nausea after the chemo 2 days ago. Swelling in chest seems to be OK. It doesn't wave anymore. ? ?PAIN:  ?Are you having pain? No, discomfort ? ? ? ?OBJECTIVE ?  ?COGNITION: ?           Overall cognitive status: Within functional limits for tasks assessed  ?  ?PALPATION: Soft swelling inferior to right mastectomy incision ?  ?OBSERVATIONS / OTHER ASSESSMENTS: incision healing well. Glue still present over mastectomy incision, drain incision still not fully healed. Swelling noted around incision ?  ?SENSATION: ?           Light touch: Appears intact, not yet normal on right ?            ?  ?POSTURE: forward head, rounded shoulders ?  ?UPPER EXTREMITY AROM/PROM: ?  ?A/PROM RIGHT  07/18/2021 ?  08/01/2021  ?Shoulder extension 57   ?Shoulder flexion 112 pain with lowering 120 with pop  ?Shoulder abduction 76 pulls in chest 102  ?Shoulder internal rotation     ?Shoulder external rotation     ?                        (Blank rows = not tested) ?  ?A/PROM LEFT  07/18/2021  ?Shoulder extension 62  ?Shoulder flexion 152  ?Shoulder abduction 152  ?Shoulder internal rotation    ?Shoulder external rotation 90  ?                        (Blank rows = not tested) ?  ?  ?CERVICAL AROM: ?All within  functional limits: left rotation limited slightly more than right, C6 disc ?  ?  ?  ?  ?UPPER EXTREMITY STRENGTH: NT on right, WFL on left ?  ?  ?  ?  LYMPHEDEMA ASSESSMENTS:  ?  ?SURGERY TYPE/DATE: Right Mastectomy with SLNB, 06/23/2021 , Left Mastectomy with SLNB 01/04/2012 ?  ?NUMBER OF LYMPH NODES REMOVED: Right 2+/3, left 0/6 ?  ?CHEMOTHERAPY: pending ?  ?RADIATION:pending ?  ?HORMONE TREATMENT: prior hormone treatment ?  ?INFECTIONS: no ?  ?LYMPHEDEMA ASSESSMENTS:  ?  ?[place] RIGHT  07/18/2021  ?10 cm proximal to olecranon process 37.4 (from front of elbow)  ?Olecranon process 29.0  ?10 cm proximal to ulnar styloid process 24.5  ?Just proximal to ulnar styloid process 19.5  ?Across hand at thumb web space 19.8  ?At base of 2nd digit 6.3  ?(Blank rows = not tested) ?  ?[place] LEFT  07/18/2021  ?10 cm proximal to olecranon process 38.6 front of elbow  ?Olecranon process 30.3  ?10 cm proximal to ulnar styloid process 24.8  ?Just proximal to ulnar styloid process 19.9  ?Across hand at thumb web space 19.7  ?At base of 2nd digit 6.3  ?(Blank rows = not tested) ?  ?  ?  ?  ?  ?QUICK DASH SURVEY: 36.36% ?  ?  ?TODAY'S TREATMENT  ?08/04/2021 ?MANUAL: soft tissue mobilization to bilateral UT, right pectorals, lats in supine and in left SL to right scapular area ?Gentle PROM to right shoulder flexion, scaption, abd, ER,  ?MLD to supraclavicular,right inguinal LN's,5 diaphragmatic breaths, right axillo-inguinal pathway and right chest directing to pathway and repeating all steps to decrease swelling. ?THERAPEUTIC EXERCISE ?Supine alternating isometrics IR/ER 2 x 20 sec ?Rhythmic stabs at 90 degrees shoulder flexion 2 x 20 secs ?Supine alphabet x 1 ? ?08/01/2021 ?MANUAL: soft tissue mobilization to bilateral UT, right pectorals, lats in supine and in left SL to right scapular area ?Gentle PROM to right shoulder flexion, scaption, abd, ER,  ?MLD to supraclavicular,right inguinal LN's,5 diaphragmatic breaths, right axillo-inguinal  pathway and right chest directing to pathway and repeating all steps to decrease swelling. ?Remeasured AROM flexion and abduction ?THERAPEUTIC EXERCISE;AROM bilateral flexion, scaption and horizontal abductio

## 2021-08-08 ENCOUNTER — Ambulatory Visit: Payer: Medicare PPO

## 2021-08-08 DIAGNOSIS — M25611 Stiffness of right shoulder, not elsewhere classified: Secondary | ICD-10-CM | POA: Diagnosis not present

## 2021-08-08 DIAGNOSIS — M25511 Pain in right shoulder: Secondary | ICD-10-CM

## 2021-08-08 DIAGNOSIS — M25512 Pain in left shoulder: Secondary | ICD-10-CM | POA: Diagnosis not present

## 2021-08-08 DIAGNOSIS — R293 Abnormal posture: Secondary | ICD-10-CM | POA: Diagnosis not present

## 2021-08-08 DIAGNOSIS — Z9011 Acquired absence of right breast and nipple: Secondary | ICD-10-CM

## 2021-08-08 NOTE — Patient Instructions (Signed)
Flexion (Isometric)      Cancer Rehab [phone] ? ? ? ?Press right fist against wall. Hold __5__ seconds. ?Repeat _5-10___ times. Do __1-2__ sessions per day. ? ?SHOULDER: Abduction (Isometric) ? ? ? ?Use wall as resistance. Press arm against pillow. Hold _5__ seconds. ?_5-10__ times. Do _1-2__ sessions per day. ? ?  ?External Rotation (Isometric) ? ? ? ?Place back of left fist against door frame, with elbow bent. Press fist against door frame. Hold __5__ seconds. ?Repeat _5-10___ times. Do _1-2___ sessions per day. ? ?Extension (Isometric) ? ? ? ?Place left bent elbow and back of arm against wall. Press elbow against wall. Hold __5__ seconds. ?Repeat _5-10___ times. Do _1-2___ sessions per day. ? ? ? ?

## 2021-08-08 NOTE — Therapy (Signed)
?OUTPATIENT PHYSICAL THERAPY TREATMENT NOTE ? ? ?Patient Name: Ann Maxwell ?MRN: [id number] ?DOB:Feb 27, 1941, 81 y.o., female ?Today's Date: 08/08/2021 ? ?PCP: Donnajean Lopes, MD ?REFERRING PROVIDER: Stark Klein, MD ? ?END OF SESSION:  ? PT End of Session - 08/08/21 1109   ? ? Visit Number 5   ? Number of Visits 12   ? Date for PT Re-Evaluation 08/30/21   ? Authorization Type [employer]   ? Authorization Time Period 07/19/2021-08/30/2021   ? Authorization - Visit Number 5   ? Authorization - Number of Visits 12   ? PT Start Time 1107   ? PT Stop Time 1203   ? PT Time Calculation (min) 56 min   ? Activity Tolerance Patient tolerated treatment well   ? Behavior During Therapy [facility] for tasks assessed/performed   ? ?  ?  ? ?  ? ? ? ?Past Medical History:  ?Diagnosis Date  ? Allergy   ? Anemia   ? iron infusion -last done 1 month ago(12-15, and 12-22 -60 [facility])- Dr. Lindi Adie  ? Anxiety   ? Asthma   ? Breast CA ([place]) 2013  ? a. L breast DCIS, s/p L mastectomy 12/2011. no further issues  ? Breast cancer ([place]) 2023  ? Right breast IDC  ? Cataract   ? bilat removed   ? Colon cancer ([place])   ? Depression   ? pt denies any hx of depression   ? Environmental allergies   ? History of kidney stones   ? Hypertension   ? controlled on meds   ? Internal hemorrhoids   ? remains an issue- no bright bleeding seen recent  ? Iron deficiency anemia   ? Obesity   ? Osteoarthritis   ? arthritis- knees and shoulders  ? Osteoporosis   ? drug induced and on Fosamax  ? Transfusion history   ? 2 yrs ago due to anemia  ? Vertigo   ? no recent issues  ? ?Past Surgical History:  ?Procedure Laterality Date  ? BREAST BIOPSY Right 05/27/2021  ? U/S Bx  ? BREAST SURGERY    ? CATARACT EXTRACTION, BILATERAL  2019  ? CHOLECYSTECTOMY    ? COLON RESECTION N/A 04/28/2014  ? Procedure: LAPAROSCOPIC  RIGHT HEMI-COLECTOMY;  Surgeon: Stark Klein, MD;  [facility];  Service: General;  Laterality: N/A;  ? COLONOSCOPY    ? Lithectomy  2010  ? LITHOTRIPSY    ?  MASTECTOMY Left 2013  ? MASTECTOMY W/ SENTINEL NODE BIOPSY  01/04/2012  ? Procedure: MASTECTOMY WITH SENTINEL LYMPH NODE BIOPSY;  Surgeon: Stark Klein, MD;  Location: [place];  Service: General;  Laterality: Left;  ? ORIF TIBIA & FIBULA FRACTURES    ? wtih subsequent pin removal in 1997  ? ORIF ULNAR / RADIAL SHAFT FRACTURE Left   ? POLYPECTOMY    ? PORTACATH PLACEMENT Left 06/23/2021  ? Procedure: INSERTION PORT-A-CATH;  Surgeon: Stark Klein, MD;  Location: [place];  Service: General;  Laterality: Left;  ? SENTINEL NODE BIOPSY Right 06/23/2021  ? Procedure: SENTINEL LYMPH NODE BIOPSY;  Surgeon: Stark Klein, MD;  Location: [place];  Service: General;  Laterality: Right;  ? SIMPLE MASTECTOMY WITH AXILLARY SENTINEL NODE BIOPSY Right 06/23/2021  ? Procedure: RIGHT MASTECTOMY;  Surgeon: Stark Klein, MD;  Location: [place];  Service: General;  Laterality: Right;  ? TONSILLECTOMY AND ADENOIDECTOMY    ? TOTAL KNEE ARTHROPLASTY Left 08/23/2015  ? Procedure: TOTAL KNEE ARTHROPLASTY;  Surgeon: Gaynelle Arabian, MD;  [facility];  Service: Orthopedics;  Laterality: Left;  ? VAGINAL HYSTERECTOMY  20 yrs. ago  ? with BSO  ? ?Patient Active Problem List  ? Diagnosis Date Noted  ? Port-A-Cath in place 08/01/2021  ? Genetic testing 07/26/2021  ? Family history of ovarian cancer 07/15/2021  ? Cancer of central portion of right breast ([place]) 06/23/2021  ? Malignant neoplasm of central portion of right breast in female, estrogen receptor negative ([place]) 06/01/2021  ? Pain in right knee 06/12/2019  ? Asthma 10/17/2017  ? OA (osteoarthritis) of knee 08/23/2015  ? Primary colon cancer ([place]) 03/31/2014  ? Iron deficiency anemia due to chronic blood loss 07/01/2013  ? Sinus tachycardia 09/12/2012  ? Mobitz type 1 second degree AV block 09/12/2012  ? Chest pain 09/11/2012  ? HTN (hypertension) 09/11/2012  ? Asthma, chronic 09/11/2012  ? Primary cancer of lower outer quadrant of left  female breast ([place]) 12/12/2011  ? ? ?REFERRING DIAG: s/p Right mastectomy ? ?THERAPY DIAG:  ?Status post right mastectomy ? ?Abnormal posture ? ?Stiffness of right shoulder, not elsewhere classified ? ?Bilateral shoulder pain, unspecified chronicity ? ?PERTINENT HISTORY:  ?06/23/2021 Surgery Right mastectomy: Grade 3 IDC with DCIS, 3.4 cm, margins negative, 2/3 lymph nodes positive ER 0%, PR 0%, HER2 negative, Ki-67 20%  ?  ?01/04/2012 Surgery  ?  Left mastectomy: Invasive ductal carcinoma 0.5 cm with high-grade DCIS 9 cm, margins negative, 0/6 lymph nodes, ER 100%, PR 100%, Ki-67 17%, HER-2   ?Pt also has a history of surgery for Colon cancer in 2016 ? ? ?PRECAUTIONS: Other: Lymphedema risk bilaterally, Right knee pain , HBP ?  ? ?SUBJECTIVE:  I'm feeling pretty good today. I can move my Rt arm so much better since I've started seeing Robin.  ? ?PAIN:  ?Are you having pain? No, discomfort ? ? ? ?OBJECTIVE ?  ?COGNITION: ?           Overall cognitive status: Within functional limits for tasks assessed  ?  ?PALPATION: Soft swelling inferior to right mastectomy incision ?  ?OBSERVATIONS / OTHER ASSESSMENTS: incision healing well. Glue still present over mastectomy incision, drain incision still not fully healed. Swelling noted around incision ?  ?SENSATION: ?           Light touch: Appears intact, not yet normal on right ?            ?  ?POSTURE: forward head, rounded shoulders ?  ?UPPER EXTREMITY AROM/PROM: ?  ?A/PROM RIGHT  07/18/2021 ?  08/01/2021  ?Shoulder extension 57   ?Shoulder flexion 112 pain with lowering 120 with pop  ?Shoulder abduction 76 pulls in chest 102  ?Shoulder internal rotation     ?Shoulder external rotation     ?                        (Blank rows = not tested) ?  ?A/PROM LEFT  07/18/2021  ?Shoulder extension 62  ?Shoulder flexion 152  ?Shoulder abduction 152  ?Shoulder internal rotation    ?Shoulder external rotation 90  ?                        (Blank rows = not tested) ?  ?  ?CERVICAL AROM: ?All  within functional limits: left rotation limited slightly more than right, C6 disc ?  ?  ?  ?  ?UPPER EXTREMITY STRENGTH: NT on right, WFL on  left ?  ?  ?  ?LYMPHEDEMA ASSESSMENTS:  ?  ?SURGERY TYPE/DATE: Right Mastectomy with SLNB, 06/23/2021 , Left Mastectomy with SLNB 01/04/2012 ?  ?NUMBER OF LYMPH NODES REMOVED: Right 2+/3, left 0/6 ?  ?CHEMOTHERAPY: pending ?  ?RADIATION:pending ?  ?HORMONE TREATMENT: prior hormone treatment ?  ?INFECTIONS: no ?  ?LYMPHEDEMA ASSESSMENTS:  ?  ?[place] RIGHT  07/18/2021  ?10 cm proximal to olecranon process 37.4 (from front of elbow)  ?Olecranon process 29.0  ?10 cm proximal to ulnar styloid process 24.5  ?Just proximal to ulnar styloid process 19.5  ?Across hand at thumb web space 19.8  ?At base of 2nd digit 6.3  ?(Blank rows = not tested) ?  ?[place] LEFT  07/18/2021  ?10 cm proximal to olecranon process 38.6 front of elbow  ?Olecranon process 30.3  ?10 cm proximal to ulnar styloid process 24.8  ?Just proximal to ulnar styloid process 19.9  ?Across hand at thumb web space 19.7  ?At base of 2nd digit 6.3  ?(Blank rows = not tested) ?  ?  ?  ?  ?  ?QUICK DASH SURVEY: 36.36% ?  ?  ?TODAY'S TREATMENT  ?08/08/21: ? ?MANUAL THERAPY: ?MFR: In Supine to Rt chest wall at end Rt shoulder passive motions ?STM: With cocoa butter to Rt UT, right pectorals, lats in supine and in left SL to right scapular area ?PROM: Gently to right shoulder flexion, and scaption with scapular depression throughout trying to limit "popping"  ?Scap Mobs: In Lt S/L to Rt scapula into protraction, retraction and depression  ?MLD: In Supine to supraclavicular, 5 diaphragmatic breaths, right inguinal LN's, right axillo-inguinal pathway and right chest directing to pathway and repeating all steps to decrease swelling throughout session and after STM ?THERAPEUTIC EXERCISE: ?In Supine: Rt UE sometrics IR and ER 5x each, 5 sec holds against therapist resistance ?In Standing: Bil countertop lat stretch 2x, 20 sec each ?In  Standing using purple ball against wall for Rt UE isometrics into flexion, er and ext 5x, 5 sec each, returning therapist demo, added these to HEP ? ?08/04/2021 ?MANUAL: soft tissue mobilization to bilateral UT, right

## 2021-08-09 ENCOUNTER — Inpatient Hospital Stay: Payer: Medicare PPO | Admitting: Adult Health

## 2021-08-09 ENCOUNTER — Inpatient Hospital Stay: Payer: Medicare PPO

## 2021-08-09 ENCOUNTER — Telehealth: Payer: Self-pay | Admitting: Hematology and Oncology

## 2021-08-09 NOTE — Progress Notes (Signed)
? ?Patient Care Team: ?Donnajean Lopes, MD as PCP - General (Internal Medicine) ?Mauro Kaufmann, RN as Oncology Nurse Navigator ?Rockwell Germany, RN as Oncology Nurse Navigator ? ?DIAGNOSIS:  ?Encounter Diagnosis  ?Name Primary?  ? Primary cancer of lower outer quadrant of left female breast ([place])   ? ? ?SUMMARY OF ONCOLOGIC HISTORY: ?Oncology History  ?Primary cancer of lower outer quadrant of left female breast ([place])  ?01/04/2012 Surgery  ? Left mastectomy: Invasive ductal carcinoma 0.5 cm with high-grade DCIS 9 cm, margins negative, 0/6 lymph nodes, ER 100%, PR 100%, Ki-67 17%, HER-2 negative ratio 1.16 ? ?  ?02/07/2012 - 05/28/2016 Anti-estrogen oral therapy  ? Anastrozole 1 mg daily (stopped early due to Osteoporosis) ?  ?06/23/2021 Surgery  ? Right mastectomy: Grade 3 IDC with DCIS, 3.4 cm, margins negative, 2/3 lymph nodes positive ER 0%, PR 0%, HER2 negative, Ki-67 20% ?  ?Primary colon cancer ([place])  ?03/20/2014 Initial Diagnosis  ? Invasive adenocarcinoma of [place] ? ?  ?04/28/2014 Surgery  ? Right hemicolectomy: Invasive adenocarcinoma with mucinous features moderately differentiated invading subserosal tissue, margins negative, 25 lymph nodes negative, T3 N0 M0 stage II a, no microsatellite instability ? ?  ?Malignant neoplasm of central portion of right breast in female, estrogen receptor negative ([place])  ?06/01/2021 Initial Diagnosis  ? Malignant neoplasm of central portion of right breast in female, estrogen receptor negative ([place]) ? ?  ?06/01/2021 Cancer Staging  ? Staging form: Breast, AJCC 8th Edition ?- Clinical: Stage IB (cT1c, cN0, cM0, G3, ER-, PR-, HER2-) - Signed by Nicholas Lose, MD on 06/01/2021 ?Histologic grading system: 3 grade system ? ?  ?08/02/2021 -  Chemotherapy  ? Patient is on Treatment Plan : BREAST Adjuvant CMF IV q21d  ? ?  ?  ?Cancer of central portion of right breast ([place])  ?06/23/2021 Initial Diagnosis  ? Cancer of central portion of right breast ([place]) ? ?  ?08/02/2021 -   Chemotherapy  ? Patient is on Treatment Plan : BREAST Adjuvant CMF IV q21d  ? ?  ?  ? ? ?CHIEF COMPLIANT: Cycle 2 CMF ? ?INTERVAL HISTORY: Ann Maxwell is a 81 y.o. with above-mentioned history of left breast cancer who is currently on surveillance after completion of adjuvant antiestrogen therapy with anastrozole. She presents to the clinic today for a follow-up and treatment. She states that she had nausea. Denies diarrhea states water tastes funny but the food was ok. ?She had some fatigue. ? ? ?ALLERGIES:  is allergic to other, pollen extract, silicone, tape, tetracycline, tyloxapol, and terramycin [oxytetracycline]. ? ?MEDICATIONS:  ?Current Outpatient Medications  ?Medication Sig Dispense Refill  ? ADVAIR DISKUS 250-50 MCG/DOSE AEPB Inhale 1 puff into the lungs 2 (two) times daily. 180 each 0  ? albuterol (VENTOLIN HFA) 108 (90 Base) MCG/ACT inhaler Inhale 2 puffs into the lungs every 6 (six) hours as needed for wheezing or shortness of breath.    ? alendronate (FOSAMAX) 70 MG tablet Take 1 tablet (70 mg total) by mouth once a week. Take with a full glass of water on an empty stomach.    ? ALPRAZolam (XANAX) 0.25 MG tablet Take 1 tablet (0.25 mg total) by mouth at bedtime as needed for anxiety. 30 tablet 0  ? gabapentin (NEURONTIN) 100 MG capsule Take 1 capsule (100 mg total) by mouth 3 (three) times daily. (Patient taking differently: Take 100 mg by mouth at bedtime.)    ? hydrochlorothiazide (MICROZIDE) 12.5 MG capsule Take 12.5 mg by mouth  daily.    ? hydrocortisone (ANUSOL-HC) 2.5 % rectal cream Place 1 application. rectally 2 (two) times daily. 30 g 1  ? lidocaine-prilocaine (EMLA) cream Apply to affected area once 30 g 3  ? loratadine (CLARITIN) 10 MG tablet Take 10 mg by mouth daily as needed for allergies.    ? losartan (COZAAR) 50 MG tablet Take 50 mg by mouth every evening.    ? melatonin 5 MG TABS Take 5 mg by mouth at bedtime as needed (sleep).    ? montelukast (SINGULAIR) 10 MG tablet Take 10  mg by mouth at bedtime.    ? ondansetron (ZOFRAN) 8 MG tablet Take 1 tablet (8 mg total) by mouth 2 (two) times daily as needed for refractory nausea / vomiting. Start on day 3 after chemotherapy. 30 tablet 1  ? polyvinyl alcohol (LIQUIFILM TEARS) 1.4 % ophthalmic solution Place 2 drops into both eyes as needed for dry eyes.    ? prochlorperazine (COMPAZINE) 10 MG tablet Take 1 tablet (10 mg total) by mouth every 6 (six) hours as needed (Nausea or vomiting). 30 tablet 1  ? Vitamin D, Ergocalciferol, (DRISDOL) 1.25 MG (50000 UNIT) CAPS capsule Take 50,000 Units by mouth every 7 (seven) days.    ? ?No current facility-administered medications for this visit.  ? ? ?PHYSICAL EXAMINATION: ?ECOG PERFORMANCE STATUS: 1 - Symptomatic but completely ambulatory ? ?Vitals:  ? 08/23/21 1007  ?BP: 137/83  ?Pulse: 80  ?Resp: 18  ?Temp: (!) 97.2 ?F (36.2 ?C)  ?SpO2: 97%  ? ?Filed Weights  ? 08/23/21 1007  ?Weight: 202 lb 12.8 oz (92 kg)  ? ?  ? ?LABORATORY DATA:  ?I have reviewed the data as listed ? ?  Latest Ref Rng & Units 08/02/2021  ?  8:01 AM 07/14/2021  ? 10:37 AM 06/16/2021  ? 12:18 PM  ?CMP  ?Glucose 70 - 99 mg/dL 127   86   120    ?BUN 8 - 23 mg/dL 15   15   19     ?Creatinine 0.44 - 1.00 mg/dL 0.67   0.71   0.67    ?Sodium 135 - 145 mmol/L 137   138   138    ?Potassium 3.5 - 5.1 mmol/L 3.3   4.3   4.5    ?Chloride 98 - 111 mmol/L 101   102   103    ?CO2 22 - 32 mmol/L 28   27   28     ?Calcium 8.9 - 10.3 mg/dL 9.2   9.9   9.0    ?Total Protein 6.5 - 8.1 g/dL 6.9   7.3     ?Total Bilirubin 0.3 - 1.2 mg/dL 0.5   0.4     ?Alkaline Phos 38 - 126 U/L 69   84     ?AST 15 - 41 U/L 15   14     ?ALT 0 - 44 U/L 17   14     ? ? ?Lab Results  ?Component Value Date  ? WBC 8.8 08/23/2021  ? HGB 14.4 08/23/2021  ? HCT 44.4 08/23/2021  ? MCV 89.3 08/23/2021  ? PLT 281 08/23/2021  ? NEUTROABS PENDING 08/23/2021  ? ? ?ASSESSMENT & PLAN:  ?Primary cancer of lower outer quadrant of left female breast ([place]) ?Left breast cancer invasive ductal  carcinoma: 0.5 cm with 9 mm area of DCIS status post left mastectomy 01/04/2012 ER/PR positive HER-2 negative Ki-67 17% currently on Arimidex 1 mg daily  Since 02/07/2012.  Completed  7 years of October 2020 ? (cecal adenocarcinoma diagnosed 04/22/2013, preop CEA of 5.1, status post right hemicolectomy 04/28/2014, grade 2, no MVI, 0/25 lymph nodes, subserosal involvement, T3 N0 M0 stage II A, no mismatch repair) ?  ?Contralateral breast cancer: ?05/19/2021: Right breast mammogram and ultrasound: 1.6 cm mass in the right breast 12 o'clock position with architectural distortion highly suspicious for malignancy, no axillary lymph nodes, biopsy: Grade 3 IDC ER 0%, PR 0%, HER2 negative 1+ by IHC, Ki-67 20%  ?  ?06/23/2021:Right mastectomy: Grade 3 IDC with DCIS, 3.4 cm, margins negative, 2/3 lymph nodes positive ER 0%, PR 0%, HER2 negative, Ki-67 20% ?   ?CT CAP and bone scan: 06/08/2021: Multiple small lung nodules stable with the exception of new 4 mm left lower lobe lung nodule and a 6 mm right lower lobe lung nodule open (nonspecific) ?  ?Treatment plan: ?Adjuvant chemotherapy with CMF x6 cycles ?Adjuvant radiation ?------------------------------------------------------------------------------------------------------- ?Current treatment: Cycle 2 CMF ?Chemotoxicities: ?1.  Mild nausea for couple of days ?2. Fatigue ?Overall she tolerated the treatment extremely well. ? ?Return to clinic in 3 weeks for cycle 3 ? ? ? ?No orders of the defined types were placed in this encounter. ? ?The patient has a good understanding of the overall plan. she agrees with it. she will call with any problems that may develop before the next visit here. ?Total time spent: 30 mins including face to face time and time spent for planning, charting and co-ordination of care ? ? Harriette Ohara, MD ?08/23/21 ? ? ? I Gardiner Coins am scribing for Dr. Lindi Adie ? ?I have reviewed the above documentation for accuracy and completeness, and I agree with  the above. ?  ?

## 2021-08-09 NOTE — Telephone Encounter (Signed)
.  Called patient to schedule appointment per 4/25 inbasket, left pt msg ?

## 2021-08-11 ENCOUNTER — Ambulatory Visit: Payer: Medicare PPO

## 2021-08-11 DIAGNOSIS — Z9011 Acquired absence of right breast and nipple: Secondary | ICD-10-CM

## 2021-08-11 DIAGNOSIS — R293 Abnormal posture: Secondary | ICD-10-CM | POA: Diagnosis not present

## 2021-08-11 DIAGNOSIS — M25511 Pain in right shoulder: Secondary | ICD-10-CM

## 2021-08-11 DIAGNOSIS — M25512 Pain in left shoulder: Secondary | ICD-10-CM | POA: Diagnosis not present

## 2021-08-11 DIAGNOSIS — M25611 Stiffness of right shoulder, not elsewhere classified: Secondary | ICD-10-CM

## 2021-08-11 NOTE — Patient Instructions (Signed)
Scapular Retraction: Bilateral ? ? ? ?Facing anchor, pull arms back, bringing shoulder blades together. ?Repeat _10___ times per set. Do _2-3___ sessions per day. ? ?SHOULDER: Extension (Band) ? ? ? ?Start with arm slightly forward. Holding band, pull backward, past hip, keeping elbow straight. Do not swing arm. Hold _3__ seconds. Use _yellow__ band. ?__10_ reps per set, _2-3__ sets per day ? ? ?Cancer Rehab [phone] ? ? ?

## 2021-08-11 NOTE — Therapy (Signed)
?OUTPATIENT PHYSICAL THERAPY TREATMENT NOTE ? ? ?Patient Name: Ann Maxwell ?MRN: [id number] ?DOB:04/28/1940, 81 y.o., female ?Today's Date: 08/11/2021 ? ?PCP: Donnajean Lopes, MD ?REFERRING PROVIDER: Stark Klein, MD ? ?END OF SESSION:  ? PT End of Session - 08/11/21 1010   ? ? Visit Number 6   ? Number of Visits 12   ? Date for PT Re-Evaluation 08/30/21   ? Authorization Type [employer]   ? Authorization Time Period 07/19/2021-08/30/2021   ? Authorization - Visit Number 6   ? Authorization - Number of Visits 12   ? PT Start Time 1008   ? PT Stop Time 1102   ? PT Time Calculation (min) 54 min   ? Activity Tolerance Patient tolerated treatment well   ? Behavior During Therapy [facility] for tasks assessed/performed   ? ?  ?  ? ?  ? ? ? ?Past Medical History:  ?Diagnosis Date  ? Allergy   ? Anemia   ? iron infusion -last done 1 month ago(12-15, and 12-22 -82 [facility])- Dr. Lindi Adie  ? Anxiety   ? Asthma   ? Breast CA ([place]) 2013  ? a. L breast DCIS, s/p L mastectomy 12/2011. no further issues  ? Breast cancer ([place]) 2023  ? Right breast IDC  ? Cataract   ? bilat removed   ? Colon cancer ([place])   ? Depression   ? pt denies any hx of depression   ? Environmental allergies   ? History of kidney stones   ? Hypertension   ? controlled on meds   ? Internal hemorrhoids   ? remains an issue- no bright bleeding seen recent  ? Iron deficiency anemia   ? Obesity   ? Osteoarthritis   ? arthritis- knees and shoulders  ? Osteoporosis   ? drug induced and on Fosamax  ? Transfusion history   ? 2 yrs ago due to anemia  ? Vertigo   ? no recent issues  ? ?Past Surgical History:  ?Procedure Laterality Date  ? BREAST BIOPSY Right 05/27/2021  ? U/S Bx  ? BREAST SURGERY    ? CATARACT EXTRACTION, BILATERAL  2019  ? CHOLECYSTECTOMY    ? COLON RESECTION N/A 04/28/2014  ? Procedure: LAPAROSCOPIC  RIGHT HEMI-COLECTOMY;  Surgeon: Stark Klein, MD;  Location: [facility];  Service: General;  Laterality: N/A;  ? COLONOSCOPY    ? Lithectomy  2010  ? LITHOTRIPSY    ?  MASTECTOMY Left 2013  ? MASTECTOMY W/ SENTINEL NODE BIOPSY  01/04/2012  ? Procedure: MASTECTOMY WITH SENTINEL LYMPH NODE BIOPSY;  Surgeon: Stark Klein, MD;  Location: [facility];  Service: General;  Laterality: Left;  ? ORIF TIBIA & FIBULA FRACTURES    ? wtih subsequent pin removal in 1997  ? ORIF ULNAR / RADIAL SHAFT FRACTURE Left   ? POLYPECTOMY    ? PORTACATH PLACEMENT Left 06/23/2021  ? Procedure: INSERTION PORT-A-CATH;  Surgeon: Stark Klein, MD;  Location: [facility];  Service: General;  Laterality: Left;  ? SENTINEL NODE BIOPSY Right 06/23/2021  ? Procedure: SENTINEL LYMPH NODE BIOPSY;  Surgeon: Stark Klein, MD;  Location: [facility];  Service: General;  Laterality: Right;  ? SIMPLE MASTECTOMY WITH AXILLARY SENTINEL NODE BIOPSY Right 06/23/2021  ? Procedure: RIGHT MASTECTOMY;  Surgeon: Stark Klein, MD;  Location: [place];  Service: General;  Laterality: Right;  ? TONSILLECTOMY AND ADENOIDECTOMY    ? TOTAL KNEE ARTHROPLASTY Left 08/23/2015  ? Procedure: TOTAL KNEE ARTHROPLASTY;  Surgeon: Gaynelle Arabian, MD;  Location: [facility];  Service: Orthopedics;  Laterality: Left;  ? VAGINAL HYSTERECTOMY  20 yrs. ago  ? with BSO  ? ?Patient Active Problem List  ? Diagnosis Date Noted  ? Port-A-Cath in place 08/01/2021  ? Genetic testing 07/26/2021  ? Family history of ovarian cancer 07/15/2021  ? Cancer of central portion of right breast ([place]) 06/23/2021  ? Malignant neoplasm of central portion of right breast in female, estrogen receptor negative ([place]) 06/01/2021  ? Pain in right knee 06/12/2019  ? Asthma 10/17/2017  ? OA (osteoarthritis) of knee 08/23/2015  ? Primary colon cancer ([place]) 03/31/2014  ? Iron deficiency anemia due to chronic blood loss 07/01/2013  ? Sinus tachycardia 09/12/2012  ? Mobitz type 1 second degree AV block 09/12/2012  ? Chest pain 09/11/2012  ? HTN (hypertension) 09/11/2012  ? Asthma, chronic 09/11/2012  ? Primary cancer of lower outer quadrant of left  female breast ([place]) 12/12/2011  ? ? ?REFERRING DIAG: s/p Right mastectomy ? ?THERAPY DIAG:  ?Status post right mastectomy ? ?Abnormal posture ? ?Stiffness of right shoulder, not elsewhere classified ? ?Bilateral shoulder pain, unspecified chronicity ? ?PERTINENT HISTORY:  ?06/23/2021 Surgery Right mastectomy: Grade 3 IDC with DCIS, 3.4 cm, margins negative, 2/3 lymph nodes positive ER 0%, PR 0%, HER2 negative, Ki-67 20%  ?  ?01/04/2012 Surgery  ?  Left mastectomy: Invasive ductal carcinoma 0.5 cm with high-grade DCIS 9 cm, margins negative, 0/6 lymph nodes, ER 100%, PR 100%, Ki-67 17%, HER-2   ?Pt also has a history of surgery for Colon cancer in 2016 ? ? ?PRECAUTIONS: Other: Lymphedema risk bilaterally, Right knee pain , HBP ?  ? ?SUBJECTIVE:  I've been doing the wall presses you gave me last time and they are going well. No increased pain. I'm not sure I was doing the countertop stretch right though bc my back was bothering me when I tried it. But my Rt arm keeps getting better. I can reach to my second shelf in my cabinets now and I could not do that before.  ? ?PAIN:  ?Are you having pain? No, discomfort ? ? ? ?OBJECTIVE ?  ?COGNITION: ?           Overall cognitive status: Within functional limits for tasks assessed  ?  ?PALPATION: Soft swelling inferior to right mastectomy incision ?  ?OBSERVATIONS / OTHER ASSESSMENTS: incision healing well. Glue still present over mastectomy incision, drain incision still not fully healed. Swelling noted around incision ?  ?SENSATION: ?           Light touch: Appears intact, not yet normal on right ?            ?  ?POSTURE: forward head, rounded shoulders ?  ?UPPER EXTREMITY AROM/PROM: ?  ?A/PROM RIGHT  07/18/2021 ?  08/01/2021  ?Shoulder extension 57   ?Shoulder flexion 112 pain with lowering 120 with pop  ?Shoulder abduction 76 pulls in chest 102  ?Shoulder internal rotation     ?Shoulder external rotation     ?                        (Blank rows = not tested) ?  ?A/PROM LEFT   07/18/2021  ?Shoulder extension 62  ?Shoulder flexion 152  ?Shoulder abduction 152  ?Shoulder internal rotation    ?Shoulder external rotation 90  ?                        (  Blank rows = not tested) ?  ?  ?CERVICAL AROM: ?All within functional limits: left rotation limited slightly more than right, C6 disc ?  ?  ?  ?  ?UPPER EXTREMITY STRENGTH: NT on right, WFL on left ?  ?  ?  ?LYMPHEDEMA ASSESSMENTS:  ?  ?SURGERY TYPE/DATE: Right Mastectomy with SLNB, 06/23/2021 , Left Mastectomy with SLNB 01/04/2012 ?  ?NUMBER OF LYMPH NODES REMOVED: Right 2+/3, left 0/6 ?  ?CHEMOTHERAPY: pending ?  ?RADIATION:pending ?  ?HORMONE TREATMENT: prior hormone treatment ?  ?INFECTIONS: no ?  ?LYMPHEDEMA ASSESSMENTS:  ?  ?[place] RIGHT  07/18/2021  ?10 cm proximal to olecranon process 37.4 (from front of elbow)  ?Olecranon process 29.0  ?10 cm proximal to ulnar styloid process 24.5  ?Just proximal to ulnar styloid process 19.5  ?Across hand at thumb web space 19.8  ?At base of 2nd digit 6.3  ?(Blank rows = not tested) ?  ?[place] LEFT  07/18/2021  ?10 cm proximal to olecranon process 38.6 front of elbow  ?Olecranon process 30.3  ?10 cm proximal to ulnar styloid process 24.8  ?Just proximal to ulnar styloid process 19.9  ?Across hand at thumb web space 19.7  ?At base of 2nd digit 6.3  ?(Blank rows = not tested) ?  ?  ?  ?  ?  ?QUICK DASH SURVEY: 36.36% ?  ?  ?TODAY'S TREATMENT  ?08/08/21: ?Therapeutic Exs:  ?Pulleys into flexion x2 mins with tactile cuing throughout to limit bil scapular compensation. ?Yellow theraband for scapular retraction and bil shoulder ext x10 each , added these to HEP ?MANUAL THERAPY: ?MFR: In Supine to Rt chest wall at end Rt shoulder passive motions ?STM: With cocoa butter to Rt UT, right pectorals, lats in supine and in left SL to right periscapular area ?PROM: Gently to right shoulder flexion, and scaption with scapular depression throughout trying to limit "popping"  ?SCAP MOBS: In Lt S/L to Rt scapula into  protraction, retraction and depression  ?MLD: In Supine to supraclavicular, 5 diaphragmatic breaths, right inguinal LN's, right axillo-inguinal pathway and right chest directing to pathway and repeating all steps to decrea

## 2021-08-15 ENCOUNTER — Ambulatory Visit: Payer: Medicare PPO | Attending: General Surgery

## 2021-08-15 DIAGNOSIS — Z9011 Acquired absence of right breast and nipple: Secondary | ICD-10-CM | POA: Diagnosis not present

## 2021-08-15 DIAGNOSIS — M25512 Pain in left shoulder: Secondary | ICD-10-CM | POA: Diagnosis not present

## 2021-08-15 DIAGNOSIS — M25511 Pain in right shoulder: Secondary | ICD-10-CM | POA: Insufficient documentation

## 2021-08-15 DIAGNOSIS — R293 Abnormal posture: Secondary | ICD-10-CM | POA: Insufficient documentation

## 2021-08-15 DIAGNOSIS — M25611 Stiffness of right shoulder, not elsewhere classified: Secondary | ICD-10-CM | POA: Insufficient documentation

## 2021-08-15 NOTE — Therapy (Signed)
?OUTPATIENT PHYSICAL THERAPY TREATMENT NOTE ? ? ?Patient Name: Ann Maxwell ?MRN: [id number] ?DOB:[birth year], 81 y.o., female ?Today's Date: 08/15/2021 ? ?PCP: Donnajean Lopes, MD ?REFERRING PROVIDER: Stark Klein, MD ? ?END OF SESSION:  ? PT End of Session - 08/15/21 1105   ? ? Visit Number 7   ? Number of Visits 12   ? Date for PT Re-Evaluation 08/30/21   ? Authorization Type [employer]   ? Authorization Time Period 07/19/2021-08/30/2021   ? Authorization - Visit Number 7   ? Authorization - Number of Visits 12   ? PT Start Time 1105   ? PT Stop Time 7510   ? PT Time Calculation (min) 52 min   ? Activity Tolerance Patient tolerated treatment well   ? Behavior During Therapy [facility] for tasks assessed/performed   ? ?  ?  ? ?  ? ? ? ?Past Medical History:  ?Diagnosis Date  ? Allergy   ? Anemia   ? iron infusion -last done 1 month ago(12-15, and 12-22 -91 [facility])- Dr. Lindi Adie  ? Anxiety   ? Asthma   ? Breast CA ([place]) 2013  ? a. L breast DCIS, s/p L mastectomy 12/2011. no further issues  ? Breast cancer ([place]) 2023  ? Right breast IDC  ? Cataract   ? bilat removed   ? Colon cancer ([place])   ? Depression   ? pt denies any hx of depression   ? Environmental allergies   ? History of kidney stones   ? Hypertension   ? controlled on meds   ? Internal hemorrhoids   ? remains an issue- no bright bleeding seen recent  ? Iron deficiency anemia   ? Obesity   ? Osteoarthritis   ? arthritis- knees and shoulders  ? Osteoporosis   ? drug induced and on Fosamax  ? Transfusion history   ? 2 yrs ago due to anemia  ? Vertigo   ? no recent issues  ? ?Past Surgical History:  ?Procedure Laterality Date  ? BREAST BIOPSY Right 05/27/2021  ? U/S Bx  ? BREAST SURGERY    ? CATARACT EXTRACTION, BILATERAL  2019  ? CHOLECYSTECTOMY    ? COLON RESECTION N/A 04/28/2014  ? Procedure: LAPAROSCOPIC  RIGHT HEMI-COLECTOMY;  Surgeon: Stark Klein, MD;  Location: [facility];  Service: General;  Laterality: N/A;  ? COLONOSCOPY    ? Lithectomy  2010  ? LITHOTRIPSY    ?  MASTECTOMY Left 2013  ? MASTECTOMY W/ SENTINEL NODE BIOPSY  01/04/2012  ? Procedure: MASTECTOMY WITH SENTINEL LYMPH NODE BIOPSY;  Surgeon: Stark Klein, MD;  Location: [place];  Service: General;  Laterality: Left;  ? ORIF TIBIA & FIBULA FRACTURES    ? wtih subsequent pin removal in 1997  ? ORIF ULNAR / RADIAL SHAFT FRACTURE Left   ? POLYPECTOMY    ? PORTACATH PLACEMENT Left 06/23/2021  ? Procedure: INSERTION PORT-A-CATH;  Surgeon: Stark Klein, MD;  Location: [place];  Service: General;  Laterality: Left;  ? SENTINEL NODE BIOPSY Right 06/23/2021  ? Procedure: SENTINEL LYMPH NODE BIOPSY;  Surgeon: Stark Klein, MD;  Location: [place];  Service: General;  Laterality: Right;  ? SIMPLE MASTECTOMY WITH AXILLARY SENTINEL NODE BIOPSY Right 06/23/2021  ? Procedure: RIGHT MASTECTOMY;  Surgeon: Stark Klein, MD;  Location: [place];  Service: General;  Laterality: Right;  ? TONSILLECTOMY AND ADENOIDECTOMY    ? TOTAL KNEE ARTHROPLASTY Left 08/23/2015  ? Procedure: TOTAL KNEE ARTHROPLASTY;  Surgeon: Gaynelle Arabian, MD;  Location: [facility];  Service: Orthopedics;  Laterality: Left;  ? VAGINAL HYSTERECTOMY  20 yrs. ago  ? with BSO  ? ?Patient Active Problem List  ? Diagnosis Date Noted  ? Port-A-Cath in place 08/01/2021  ? Genetic testing 07/26/2021  ? Family history of ovarian cancer 07/15/2021  ? Cancer of central portion of right breast ([place]) 06/23/2021  ? Malignant neoplasm of central portion of right breast in female, estrogen receptor negative ([place]) 06/01/2021  ? Pain in right knee 06/12/2019  ? Asthma 10/17/2017  ? OA (osteoarthritis) of knee 08/23/2015  ? Primary colon cancer ([place]) 03/31/2014  ? Iron deficiency anemia due to chronic blood loss 07/01/2013  ? Sinus tachycardia 09/12/2012  ? Mobitz type 1 second degree AV block 09/12/2012  ? Chest pain 09/11/2012  ? HTN (hypertension) 09/11/2012  ? Asthma, chronic 09/11/2012  ? Primary cancer of lower outer quadrant of left  female breast ([place]) 12/12/2011  ? ? ?REFERRING DIAG: s/p Right mastectomy ? ?THERAPY DIAG:  ?Status post right mastectomy ? ?Abnormal posture ? ?Stiffness of right shoulder, not elsewhere classified ? ?Bilateral shoulder pain, unspecified chronicity ? ?PERTINENT HISTORY:  ?06/23/2021 Surgery Right mastectomy: Grade 3 IDC with DCIS, 3.4 cm, margins negative, 2/3 lymph nodes positive ER 0%, PR 0%, HER2 negative, Ki-67 20%  ?  ?01/04/2012 Surgery  ?  Left mastectomy: Invasive ductal carcinoma 0.5 cm with high-grade DCIS 9 cm, margins negative, 0/6 lymph nodes, ER 100%, PR 100%, Ki-67 17%, HER-2   ?Pt also has a history of surgery for Colon cancer in 2016 ? ? ?PRECAUTIONS: Other: Lymphedema risk bilaterally, Right knee pain , HBP ?  ? ?SUBJECTIVE:  I can reach the second shelf now without any pain. The pain from the swelling is gone and it looks good. Still pops at random times ? ?PAIN:  ?Are you having pain? No, but I am aware of my shoulder. ? ? ? ?OBJECTIVE ?  ?COGNITION: ?           Overall cognitive status: Within functional limits for tasks assessed  ?  ?PALPATION: Soft swelling inferior to right mastectomy incision ?  ?OBSERVATIONS / OTHER ASSESSMENTS: incision healing well. Glue still present over mastectomy incision, drain incision still not fully healed. Swelling noted around incision ?  ?SENSATION: ?           Light touch: Appears intact, not yet normal on right ?            ?  ?POSTURE: forward head, rounded shoulders ?  ?UPPER EXTREMITY AROM/PROM: ?  ?A/PROM RIGHT  07/18/2021 ?  08/01/2021  ?Shoulder extension 57   ?Shoulder flexion 112 pain with lowering 120 with pop  ?Shoulder abduction 76 pulls in chest 102  ?Shoulder internal rotation     ?Shoulder external rotation     ?                        (Blank rows = not tested) ?  ?A/PROM LEFT  07/18/2021  ?Shoulder extension 62  ?Shoulder flexion 152  ?Shoulder abduction 152  ?Shoulder internal rotation    ?Shoulder external rotation 90  ?                         (Blank rows = not tested) ?  ?  ?CERVICAL AROM: ?All within functional limits: left rotation limited slightly more than right, C6 disc ?  ?  ?  ?  ?  UPPER EXTREMITY STRENGTH: NT on right, WFL on left ?  ?  ?  ?LYMPHEDEMA ASSESSMENTS:  ?  ?SURGERY TYPE/DATE: Right Mastectomy with SLNB, 06/23/2021 , Left Mastectomy with SLNB 01/04/2012 ?  ?NUMBER OF LYMPH NODES REMOVED: Right 2+/3, left 0/6 ?  ?CHEMOTHERAPY: pending ?  ?RADIATION:pending ?  ?HORMONE TREATMENT: prior hormone treatment ?  ?INFECTIONS: no ?  ?LYMPHEDEMA ASSESSMENTS:  ?  ?[place] RIGHT  07/18/2021  ?10 cm proximal to olecranon process 37.4 (from front of elbow)  ?Olecranon process 29.0  ?10 cm proximal to ulnar styloid process 24.5  ?Just proximal to ulnar styloid process 19.5  ?Across hand at thumb web space 19.8  ?At base of 2nd digit 6.3  ?(Blank rows = not tested) ?  ?[place] LEFT  07/18/2021  ?10 cm proximal to olecranon process 38.6 front of elbow  ?Olecranon process 30.3  ?10 cm proximal to ulnar styloid process 24.8  ?Just proximal to ulnar styloid process 19.9  ?Across hand at thumb web space 19.7  ?At base of 2nd digit 6.3  ?(Blank rows = not tested) ?  ?  ?  ?  ?  ?QUICK DASH SURVEY: 36.36% ?  ?  ?TODAY'S TREATMENT  ?08/15/2021 ? ?Therapeutic Exs:  ?Supine serratus punch x 10, Rhythmic stabs at 90 degrees flexion with PT resistance in supine 2 x 30 sec, Alternating isometrics IR and ER x 20 seconds. ?Yellow theraband for scapular retraction and bil shoulder ext x 5 each , Supine horizontal abd x 5, standing ER x 5 added these to HEP ?MANUAL THERAPY: ?MFR: In Supine to Rt chest wall at end Rt shoulder passive motions ?STM: With cocoa butter to Rt UT, right pectorals, lats in supine and in left SL to right periscapular area ?PROM: Gently to right shoulder flexion, and scaption, and abduction with scapular depression throughout trying to limit "popping"  ?Scar mobilization to right mastectomy incision. ? ? ? ?08/08/21: ?Therapeutic Exs:  ?Pulleys into  flexion x2 mins with tactile cuing throughout to limit bil scapular compensation. ?Yellow theraband for scapular retraction and bil shoulder ext x10 each , added these to HEP ?MANUAL THERAPY: ?MFR: In Supine to Rt chest

## 2021-08-15 NOTE — Patient Instructions (Addendum)
?  Side Pull: Double Arm ? ? ?On back, knees bent, feet flat. Arms perpendicular to body, shoulder level, elbows straight but relaxed. Pull arms out to sides, elbows straight. Resistance band comes across collarbones, hands toward floor. Hold momentarily. Slowly return to starting position. Repeat _5-10__ times. Band color _yellow____  ? ? ? ?Access Code: [email or web address] ?URL: [email or web address] ?Date: 08/15/2021 ?Prepared by: Cheral Almas ? ?Exercises ?- Shoulder External Rotation and Scapular Retraction with Resistance  - 1 x daily - 2-3 x weekly - 1 sets - 5 reps ?

## 2021-08-18 ENCOUNTER — Ambulatory Visit: Payer: Medicare PPO

## 2021-08-18 DIAGNOSIS — M25512 Pain in left shoulder: Secondary | ICD-10-CM | POA: Diagnosis not present

## 2021-08-18 DIAGNOSIS — M25611 Stiffness of right shoulder, not elsewhere classified: Secondary | ICD-10-CM | POA: Diagnosis not present

## 2021-08-18 DIAGNOSIS — R293 Abnormal posture: Secondary | ICD-10-CM | POA: Diagnosis not present

## 2021-08-18 DIAGNOSIS — Z9011 Acquired absence of right breast and nipple: Secondary | ICD-10-CM

## 2021-08-18 DIAGNOSIS — M25511 Pain in right shoulder: Secondary | ICD-10-CM | POA: Diagnosis not present

## 2021-08-18 NOTE — Patient Instructions (Signed)
Access Code: [email or web address] ?URL: [email or web address] ?Date: 08/18/2021 ?Prepared by: Cheral Almas ? ?Exercises ?- Single Arm Doorway Pec Stretch at 90 Degrees Abduction  - 1 x daily - 7 x weekly - 1 sets - 3 reps - 10 hold ?- Standing Shoulder Flexion Wall Walk  - 1 x daily - 7 x weekly - 1 sets - 5-10 reps ?

## 2021-08-18 NOTE — Therapy (Signed)
?OUTPATIENT PHYSICAL THERAPY TREATMENT NOTE ? ? ?Patient Name: Ann Maxwell ?MRN: [id number] ?DOB:March 24, 1941, 81 y.o., female ?Today's Date: 08/18/2021 ? ?PCP: Donnajean Lopes, MD ?REFERRING PROVIDER: Stark Klein, MD ? ?END OF SESSION:  ? PT End of Session - 08/18/21 1105   ? ? Visit Number 8   ? Number of Visits 12   ? Date for PT Re-Evaluation 08/30/21   ? Authorization Type [employer]   ? Authorization Time Period 07/19/2021-08/30/2021   ? Authorization - Visit Number 8   ? Authorization - Number of Visits 12   ? PT Start Time 1106   ? PT Stop Time 1200   ? PT Time Calculation (min) 54 min   ? Activity Tolerance Patient tolerated treatment well   ? Behavior During Therapy [facility] for tasks assessed/performed   ? ?  ?  ? ?  ? ? ? ?Past Medical History:  ?Diagnosis Date  ? Allergy   ? Anemia   ? iron infusion -last done 1 month ago(12-15, and 12-22 -[facility])- Dr. Lindi Adie  ? Anxiety   ? Asthma   ? Breast CA ([place]) 2013  ? a. L breast DCIS, s/p L mastectomy 12/2011. no further issues  ? Breast cancer ([place]) 2023  ? Right breast IDC  ? Cataract   ? bilat removed   ? Colon cancer ([place])   ? Depression   ? pt denies any hx of depression   ? Environmental allergies   ? History of kidney stones   ? Hypertension   ? controlled on meds   ? Internal hemorrhoids   ? remains an issue- no bright bleeding seen recent  ? Iron deficiency anemia   ? Obesity   ? Osteoarthritis   ? arthritis- knees and shoulders  ? Osteoporosis   ? drug induced and on Fosamax  ? Transfusion history   ? 2 yrs ago due to anemia  ? Vertigo   ? no recent issues  ? ?Past Surgical History:  ?Procedure Laterality Date  ? BREAST BIOPSY Right 05/27/2021  ? U/S Bx  ? BREAST SURGERY    ? CATARACT EXTRACTION, BILATERAL  2019  ? CHOLECYSTECTOMY    ? COLON RESECTION N/A 04/28/2014  ? Procedure: LAPAROSCOPIC  RIGHT HEMI-COLECTOMY;  Surgeon: Stark Klein, MD;  [facility];  Service: General;  Laterality: N/A;  ? COLONOSCOPY    ? Lithectomy  2010  ? LITHOTRIPSY    ?  MASTECTOMY Left 2013  ? MASTECTOMY W/ SENTINEL NODE BIOPSY  01/04/2012  ? Procedure: MASTECTOMY WITH SENTINEL LYMPH NODE BIOPSY;  Surgeon: Stark Klein, MD;  Location: [place];  Service: General;  Laterality: Left;  ? ORIF TIBIA & FIBULA FRACTURES    ? wtih subsequent pin removal in 1997  ? ORIF ULNAR / RADIAL SHAFT FRACTURE Left   ? POLYPECTOMY    ? PORTACATH PLACEMENT Left 06/23/2021  ? Procedure: INSERTION PORT-A-CATH;  Surgeon: Stark Klein, MD;  Location: [place];  Service: General;  Laterality: Left;  ? SENTINEL NODE BIOPSY Right 06/23/2021  ? Procedure: SENTINEL LYMPH NODE BIOPSY;  Surgeon: Stark Klein, MD;  Location: [place];  Service: General;  Laterality: Right;  ? SIMPLE MASTECTOMY WITH AXILLARY SENTINEL NODE BIOPSY Right 06/23/2021  ? Procedure: RIGHT MASTECTOMY;  Surgeon: Stark Klein, MD;  Location: [place];  Service: General;  Laterality: Right;  ? TONSILLECTOMY AND ADENOIDECTOMY    ? TOTAL KNEE ARTHROPLASTY Left 08/23/2015  ? Procedure: TOTAL KNEE ARTHROPLASTY;  Surgeon: Gaynelle Arabian, MD;  [facility];  Service: Orthopedics;  Laterality: Left;  ? VAGINAL HYSTERECTOMY  20 yrs. ago  ? with BSO  ? ?Patient Active Problem List  ? Diagnosis Date Noted  ? Port-A-Cath in place 08/01/2021  ? Genetic testing 07/26/2021  ? Family history of ovarian cancer 07/15/2021  ? Cancer of central portion of right breast ([place]) 06/23/2021  ? Malignant neoplasm of central portion of right breast in female, estrogen receptor negative ([place]) 06/01/2021  ? Pain in right knee 06/12/2019  ? Asthma 10/17/2017  ? OA (osteoarthritis) of knee 08/23/2015  ? Primary colon cancer ([place]) 03/31/2014  ? Iron deficiency anemia due to chronic blood loss 07/01/2013  ? Sinus tachycardia 09/12/2012  ? Mobitz type 1 second degree AV block 09/12/2012  ? Chest pain 09/11/2012  ? HTN (hypertension) 09/11/2012  ? Asthma, chronic 09/11/2012  ? Primary cancer of lower outer quadrant of left  female breast ([place]) 12/12/2011  ? ? ?REFERRING DIAG: s/p Right mastectomy ? ?THERAPY DIAG:  ?Status post right mastectomy ? ?Abnormal posture ? ?Stiffness of right shoulder, not elsewhere classified ? ?Bilateral shoulder pain, unspecified chronicity ? ?PERTINENT HISTORY:  ?06/23/2021 Surgery Right mastectomy: Grade 3 IDC with DCIS, 3.4 cm, margins negative, 2/3 lymph nodes positive ER 0%, PR 0%, HER2 negative, Ki-67 20%  ?  ?01/04/2012 Surgery  ?  Left mastectomy: Invasive ductal carcinoma 0.5 cm with high-grade DCIS 9 cm, margins negative, 0/6 lymph nodes, ER 100%, PR 100%, Ki-67 17%, HER-2   ?Pt also has a history of surgery for Colon cancer in 2016 ? ? ?PRECAUTIONS: Other: Lymphedema risk bilaterally, Right knee pain , HBP ?  ? ?SUBJECTIVE:   ?PAIN:  ?I think I am doing pretty well. Compliant with HEP.  Popping is still present in right shoulder, but seems a lot better. I can put T shirts on over my head, and get my arm in and out of clothes ? ?Are you having pain? No,  ? ? ? ?OBJECTIVE ?  ?COGNITION: ?           Overall cognitive status: Within functional limits for tasks assessed  ?  ?PALPATION: Soft swelling inferior to right mastectomy incision ?  ?OBSERVATIONS / OTHER ASSESSMENTS: incision healing well. Glue still present over mastectomy incision, drain incision still not fully healed. Swelling noted around incision ?  ?SENSATION: ?           Light touch: Appears intact, not yet normal on right ?            ?  ?POSTURE: forward head, rounded shoulders ?  ?UPPER EXTREMITY AROM/PROM: ?  ?A/PROM RIGHT  07/18/2021 ?  08/01/2021 08/18/2021  ?Shoulder extension 57    ?Shoulder flexion 112 pain with lowering 120 with pop 124 discomfort with lowering  ?Shoulder abduction 76 pulls in chest 102 102 with slight scaption, difficulty lowering  ?Shoulder internal rotation      ?Shoulder external rotation      ?                        (Blank rows = not tested) ?  ?A/PROM LEFT  07/18/2021  ?Shoulder extension 62  ?Shoulder flexion  152  ?Shoulder abduction 152  ?Shoulder internal rotation    ?Shoulder external rotation 90  ?                        (Blank rows = not  tested) ?  ?  ?CERVICAL AROM: ?All within functional limits: left rotation limited slightly more than right, C6 disc ?  ?  ?  ?  ?UPPER EXTREMITY STRENGTH: NT on right, WFL on left ?  ?  ?  ?LYMPHEDEMA ASSESSMENTS:  ?  ?SURGERY TYPE/DATE: Right Mastectomy with SLNB, 06/23/2021 , Left Mastectomy with SLNB 01/04/2012 ?  ?NUMBER OF LYMPH NODES REMOVED: Right 2+/3, left 0/6 ?  ?CHEMOTHERAPY: pending ?  ?RADIATION:pending ?  ?HORMONE TREATMENT: prior hormone treatment ?  ?INFECTIONS: no ?  ?LYMPHEDEMA ASSESSMENTS:  ?  ?[place] RIGHT  07/18/2021  ?10 cm proximal to olecranon process 37.4 (from front of elbow)  ?Olecranon process 29.0  ?10 cm proximal to ulnar styloid process 24.5  ?Just proximal to ulnar styloid process 19.5  ?Across hand at thumb web space 19.8  ?At base of 2nd digit 6.3  ?(Blank rows = not tested) ?  ?[place] LEFT  07/18/2021  ?10 cm proximal to olecranon process 38.6 front of elbow  ?Olecranon process 30.3  ?10 cm proximal to ulnar styloid process 24.8  ?Just proximal to ulnar styloid process 19.9  ?Across hand at thumb web space 19.7  ?At base of 2nd digit 6.3  ?(Blank rows = not tested) ?  ?  ?  ?  ?  ?QUICK DASH SURVEY: 36.36% ?  ?  ?TODAY'S TREATMENT  ?08/18/2021 ?Therapeutic Exs:  ?Supine serratus punch x 15, Rhythmic stabs at 90 degrees flexion with PT resistance in supine 2 x 30 sec, Alternating isometrics IR and ER 2 x 20 seconds. Supine AROM flexion and scaption with emphasis on scapular depression x 10 in pain free ROM ?Standing single arm chest stretch on doorway, wall slides x 3 ea ? ?MANUAL THERAPY: ?MFR: In Supine to Rt chest wall at end Rt shoulder passive motions ?STM: With cocoa butter to Rt UT, right pectorals, lats in supine and in left SL to right periscapular area ?PROM: Gently to right shoulder flexion, and scaption, and abduction with scapular  depression throughout trying to limit "popping"  ? ?08/15/2021 ? ?Therapeutic Exs:  ?Supine serratus punch x 10, Rhythmic stabs at 90 degrees flexion with PT resistance in supine 2 x 30 sec, Alternating isometrics IR and ER

## 2021-08-22 ENCOUNTER — Ambulatory Visit: Payer: Medicare PPO

## 2021-08-22 DIAGNOSIS — M25512 Pain in left shoulder: Secondary | ICD-10-CM | POA: Diagnosis not present

## 2021-08-22 DIAGNOSIS — M25511 Pain in right shoulder: Secondary | ICD-10-CM

## 2021-08-22 DIAGNOSIS — R293 Abnormal posture: Secondary | ICD-10-CM | POA: Diagnosis not present

## 2021-08-22 DIAGNOSIS — M25611 Stiffness of right shoulder, not elsewhere classified: Secondary | ICD-10-CM | POA: Diagnosis not present

## 2021-08-22 DIAGNOSIS — Z9011 Acquired absence of right breast and nipple: Secondary | ICD-10-CM

## 2021-08-22 NOTE — Therapy (Signed)
?OUTPATIENT PHYSICAL THERAPY TREATMENT NOTE ? ? ?Patient Name: Ann Maxwell ?MRN: [id number] ?DOB:May 02, 1940, 81 y.o., female ?Today's Date: 08/22/2021 ? ?PCP: Donnajean Lopes, MD ?REFERRING PROVIDER: Stark Klein, MD ? ?END OF SESSION:  ? PT End of Session - 08/22/21 1005   ? ? Visit Number 9   ? Number of Visits 12   ? Date for PT Re-Evaluation 08/30/21   ? Authorization Type [employer]   ? Authorization Time Period 07/19/2021-08/30/2021   ? Authorization - Visit Number 9   ? Authorization - Number of Visits 12   ? PT Start Time 1005   ? PT Stop Time 1055   ? PT Time Calculation (min) 50 min   ? Activity Tolerance Patient tolerated treatment well   ? Behavior During Therapy [facility] for tasks assessed/performed   ? ?  ?  ? ?  ? ? ? ?Past Medical History:  ?Diagnosis Date  ? Allergy   ? Anemia   ? iron infusion -last done 1 month ago(12-15, and 12-22 -90 [facility])- Dr. Lindi Adie  ? Anxiety   ? Asthma   ? Breast CA ([place]) 2013  ? a. L breast DCIS, s/p L mastectomy 12/2011. no further issues  ? Breast cancer ([place]) 2023  ? Right breast IDC  ? Cataract   ? bilat removed   ? Colon cancer ([place])   ? Depression   ? pt denies any hx of depression   ? Environmental allergies   ? History of kidney stones   ? Hypertension   ? controlled on meds   ? Internal hemorrhoids   ? remains an issue- no bright bleeding seen recent  ? Iron deficiency anemia   ? Obesity   ? Osteoarthritis   ? arthritis- knees and shoulders  ? Osteoporosis   ? drug induced and on Fosamax  ? Transfusion history   ? 2 yrs ago due to anemia  ? Vertigo   ? no recent issues  ? ?Past Surgical History:  ?Procedure Laterality Date  ? BREAST BIOPSY Right 05/27/2021  ? U/S Bx  ? BREAST SURGERY    ? CATARACT EXTRACTION, BILATERAL  2019  ? CHOLECYSTECTOMY    ? COLON RESECTION N/A 04/28/2014  ? Procedure: LAPAROSCOPIC  RIGHT HEMI-COLECTOMY;  Surgeon: Stark Klein, MD;  [facility];  Service: General;  Laterality: N/A;  ? COLONOSCOPY    ? Lithectomy  2010  ? LITHOTRIPSY    ?  MASTECTOMY Left 2013  ? MASTECTOMY W/ SENTINEL NODE BIOPSY  01/04/2012  ? Procedure: MASTECTOMY WITH SENTINEL LYMPH NODE BIOPSY;  Surgeon: Stark Klein, MD;  Location: [place];  Service: General;  Laterality: Left;  ? ORIF TIBIA & FIBULA FRACTURES    ? wtih subsequent pin removal in 1997  ? ORIF ULNAR / RADIAL SHAFT FRACTURE Left   ? POLYPECTOMY    ? PORTACATH PLACEMENT Left 06/23/2021  ? Procedure: INSERTION PORT-A-CATH;  Surgeon: Stark Klein, MD;  Location: [place];  Service: General;  Laterality: Left;  ? SENTINEL NODE BIOPSY Right 06/23/2021  ? Procedure: SENTINEL LYMPH NODE BIOPSY;  Surgeon: Stark Klein, MD;  Location: [place];  Service: General;  Laterality: Right;  ? SIMPLE MASTECTOMY WITH AXILLARY SENTINEL NODE BIOPSY Right 06/23/2021  ? Procedure: RIGHT MASTECTOMY;  Surgeon: Stark Klein, MD;  Location: [place];  Service: General;  Laterality: Right;  ? TONSILLECTOMY AND ADENOIDECTOMY    ? TOTAL KNEE ARTHROPLASTY Left 08/23/2015  ? Procedure: TOTAL KNEE ARTHROPLASTY;  Surgeon: Gaynelle Arabian, MD;  [facility];  Service: Orthopedics;  Laterality: Left;  ? VAGINAL HYSTERECTOMY  20 yrs. ago  ? with BSO  ? ?Patient Active Problem List  ? Diagnosis Date Noted  ? Port-A-Cath in place 08/01/2021  ? Genetic testing 07/26/2021  ? Family history of ovarian cancer 07/15/2021  ? Cancer of central portion of right breast ([place]) 06/23/2021  ? Malignant neoplasm of central portion of right breast in female, estrogen receptor negative ([place]) 06/01/2021  ? Pain in right knee 06/12/2019  ? Asthma 10/17/2017  ? OA (osteoarthritis) of knee 08/23/2015  ? Primary colon cancer ([place]) 03/31/2014  ? Iron deficiency anemia due to chronic blood loss 07/01/2013  ? Sinus tachycardia 09/12/2012  ? Mobitz type 1 second degree AV block 09/12/2012  ? Chest pain 09/11/2012  ? HTN (hypertension) 09/11/2012  ? Asthma, chronic 09/11/2012  ? Primary cancer of lower outer quadrant of left  female breast ([place]) 12/12/2011  ? ? ?REFERRING DIAG: s/p Right mastectomy ? ?THERAPY DIAG:  ?Status post right mastectomy ? ?Abnormal posture ? ?Stiffness of right shoulder, not elsewhere classified ? ?Bilateral shoulder pain, unspecified chronicity ? ?PERTINENT HISTORY:  ?06/23/2021 Surgery Right mastectomy: Grade 3 IDC with DCIS, 3.4 cm, margins negative, 2/3 lymph nodes positive ER 0%, PR 0%, HER2 negative, Ki-67 20%  ?  ?01/04/2012 Surgery  ?  Left mastectomy: Invasive ductal carcinoma 0.5 cm with high-grade DCIS 9 cm, margins negative, 0/6 lymph nodes, ER 100%, PR 100%, Ki-67 17%, HER-2   ?Pt also has a history of surgery for Colon cancer in 2016 ? ? ?PRECAUTIONS: Other: Lymphedema risk bilaterally, Right knee pain , HBP ?  ? ?SUBJECTIVE: Shoulder is doing well.  No present pain. I could do the pec stretch exercise with my arm down low on the wall.  I have been doing the arm raises lying in the recliner with a can of soup, and the horizontal abduction in supine and sitting ? ? ? ?PAIN:  ?Are you having pain? No,  ? ? ? ?OBJECTIVE ?  ?COGNITION: ?           Overall cognitive status: Within functional limits for tasks assessed  ?  ?PALPATION: Soft swelling inferior to right mastectomy incision ?  ?OBSERVATIONS / OTHER ASSESSMENTS: incision healing well. Glue still present over mastectomy incision, drain incision still not fully healed. Swelling noted around incision ?  ?SENSATION: ?           Light touch: Appears intact, not yet normal on right ?            ?  ?POSTURE: forward head, rounded shoulders ?  ?UPPER EXTREMITY AROM/PROM: ?  ?A/PROM RIGHT  07/18/2021 ?  08/01/2021 08/18/2021  ?Shoulder extension 57    ?Shoulder flexion 112 pain with lowering 120 with pop 124 discomfort with lowering  ?Shoulder abduction 76 pulls in chest 102 102 with slight scaption, difficulty lowering  ?Shoulder internal rotation      ?Shoulder external rotation      ?                        (Blank rows = not tested) ?  ?A/PROM LEFT  07/18/2021   ?Shoulder extension 62  ?Shoulder flexion 152  ?Shoulder abduction 152  ?Shoulder internal rotation    ?Shoulder external rotation 90  ?                        (  Blank rows = not tested) ?  ?  ?CERVICAL AROM: ?All within functional limits: left rotation limited slightly more than right, C6 disc ?  ?  ?  ?  ?UPPER EXTREMITY STRENGTH: NT on right, WFL on left ?  ?  ?  ?LYMPHEDEMA ASSESSMENTS:  ?  ?SURGERY TYPE/DATE: Right Mastectomy with SLNB, 06/23/2021 , Left Mastectomy with SLNB 01/04/2012 ?  ?NUMBER OF LYMPH NODES REMOVED: Right 2+/3, left 0/6 ?  ?CHEMOTHERAPY: pending ?  ?RADIATION:pending ?  ?HORMONE TREATMENT: prior hormone treatment ?  ?INFECTIONS: no ?  ?LYMPHEDEMA ASSESSMENTS:  ?  ?[place] RIGHT  07/18/2021  ?10 cm proximal to olecranon process 37.4 (from front of elbow)  ?Olecranon process 29.0  ?10 cm proximal to ulnar styloid process 24.5  ?Just proximal to ulnar styloid process 19.5  ?Across hand at thumb web space 19.8  ?At base of 2nd digit 6.3  ?(Blank rows = not tested) ?  ?[place] LEFT  07/18/2021  ?10 cm proximal to olecranon process 38.6 front of elbow  ?Olecranon process 30.3  ?10 cm proximal to ulnar styloid process 24.8  ?Just proximal to ulnar styloid process 19.9  ?Across hand at thumb web space 19.7  ?At base of 2nd digit 6.3  ?(Blank rows = not tested) ?  ?  ?  ?  ?  ?QUICK DASH SURVEY: 36.36% ?  ?  ?TODAY'S TREATMENT  ?08/22/2021 ?Therapeutic Exs:  ?Supine serratus punch x 15 with 1#, Rhythmic stabs at 90 degrees flexion with PT resistance in supine 2 x 30 sec, Alternating isometrics IR and ER 2 x 20 seconds. Serratus punch 1# x 15, supine horizontal abduction and ER with yellow band x 10 ea ? ?MANUAL THERAPY: ?MFR: In Supine to Rt chest wall . QRF:XJOI cocoa butter to Rt UT, right pectorals, lats in supine and in left SL to right UT and periscapular area ?PROM: Gently to right shoulder flexion, and scaption, and abduction with scapular depression throughout and no  popping. ? ? ? ?08/18/2021 ?Therapeutic Exs:  ?Supine serratus punch x 15, Rhythmic stabs at 90 degrees flexion with PT resistance in supine 2 x 30 sec, Alternating isometrics IR and ER 2 x 20 seconds. Supine AROM flexion and scaption with emp

## 2021-08-23 ENCOUNTER — Inpatient Hospital Stay: Payer: Medicare PPO

## 2021-08-23 ENCOUNTER — Other Ambulatory Visit: Payer: Self-pay

## 2021-08-23 ENCOUNTER — Inpatient Hospital Stay: Payer: Medicare PPO | Attending: Hematology and Oncology | Admitting: Hematology and Oncology

## 2021-08-23 DIAGNOSIS — C50512 Malignant neoplasm of lower-outer quadrant of left female breast: Secondary | ICD-10-CM | POA: Diagnosis not present

## 2021-08-23 DIAGNOSIS — C50111 Malignant neoplasm of central portion of right female breast: Secondary | ICD-10-CM | POA: Diagnosis not present

## 2021-08-23 DIAGNOSIS — Z171 Estrogen receptor negative status [ER-]: Secondary | ICD-10-CM | POA: Diagnosis not present

## 2021-08-23 DIAGNOSIS — Z5111 Encounter for antineoplastic chemotherapy: Secondary | ICD-10-CM | POA: Insufficient documentation

## 2021-08-23 DIAGNOSIS — R918 Other nonspecific abnormal finding of lung field: Secondary | ICD-10-CM | POA: Insufficient documentation

## 2021-08-23 DIAGNOSIS — D500 Iron deficiency anemia secondary to blood loss (chronic): Secondary | ICD-10-CM

## 2021-08-23 DIAGNOSIS — Z79899 Other long term (current) drug therapy: Secondary | ICD-10-CM | POA: Diagnosis not present

## 2021-08-23 DIAGNOSIS — Z79811 Long term (current) use of aromatase inhibitors: Secondary | ICD-10-CM | POA: Diagnosis not present

## 2021-08-23 DIAGNOSIS — Z95828 Presence of other vascular implants and grafts: Secondary | ICD-10-CM

## 2021-08-23 LAB — CBC WITH DIFFERENTIAL (CANCER CENTER ONLY)
Abs Immature Granulocytes: 0.55 10*3/uL — ABNORMAL HIGH (ref 0.00–0.07)
Basophils Absolute: 0.1 10*3/uL (ref 0.0–0.1)
Basophils Relative: 1 %
Eosinophils Absolute: 0.3 10*3/uL (ref 0.0–0.5)
Eosinophils Relative: 3 %
HCT: 44.4 % (ref 36.0–46.0)
Hemoglobin: 14.4 g/dL (ref 12.0–15.0)
Immature Granulocytes: 6 %
Lymphocytes Relative: 11 %
Lymphs Abs: 1 10*3/uL (ref 0.7–4.0)
MCH: 29 pg (ref 26.0–34.0)
MCHC: 32.4 g/dL (ref 30.0–36.0)
MCV: 89.3 fL (ref 80.0–100.0)
Monocytes Absolute: 1 10*3/uL (ref 0.1–1.0)
Monocytes Relative: 12 %
Neutro Abs: 5.9 10*3/uL (ref 1.7–7.7)
Neutrophils Relative %: 67 %
Platelet Count: 281 10*3/uL (ref 150–400)
RBC: 4.97 MIL/uL (ref 3.87–5.11)
RDW: 13.4 % (ref 11.5–15.5)
Smear Review: NORMAL
WBC Count: 8.8 10*3/uL (ref 4.0–10.5)
nRBC: 0 % (ref 0.0–0.2)

## 2021-08-23 LAB — CMP (CANCER CENTER ONLY)
ALT: 22 U/L (ref 0–44)
AST: 21 U/L (ref 15–41)
Albumin: 3.9 g/dL (ref 3.5–5.0)
Alkaline Phosphatase: 76 U/L (ref 38–126)
Anion gap: 11 (ref 5–15)
BUN: 15 mg/dL (ref 8–23)
CO2: 26 mmol/L (ref 22–32)
Calcium: 9.4 mg/dL (ref 8.9–10.3)
Chloride: 100 mmol/L (ref 98–111)
Creatinine: 0.65 mg/dL (ref 0.44–1.00)
GFR, Estimated: >60 mL/min (ref >60)
Glucose, Bld: 108 mg/dL — ABNORMAL HIGH (ref 70–99)
Potassium: 3.5 mmol/L (ref 3.5–5.1)
Sodium: 137 mmol/L (ref 135–145)
Total Bilirubin: 0.5 mg/dL (ref 0.3–1.2)
Total Protein: 6.9 g/dL (ref 6.5–8.1)

## 2021-08-23 MED ORDER — SODIUM CHLORIDE 0.9 % IV SOLN: Freq: Once | INTRAVENOUS | Status: AC

## 2021-08-23 MED ORDER — METHOTREXATE SODIUM (PF) CHEMO INJECTION 250 MG/10ML
40.0000 mg/m2 | Freq: Once | INTRAMUSCULAR | Status: AC
  Administered 2021-08-23: 81.25 mg via INTRAVENOUS
  Filled 2021-08-23: qty 3.25

## 2021-08-23 MED ORDER — HEPARIN SOD (PORK) LOCK FLUSH 100 UNIT/ML IV SOLN
500.0000 [IU] | Freq: Once | INTRAVENOUS | Status: AC | PRN
  Administered 2021-08-23: 500 [IU]

## 2021-08-23 MED ORDER — SODIUM CHLORIDE 0.9% FLUSH
10.0000 mL | Freq: Once | INTRAVENOUS | Status: AC
  Administered 2021-08-23: 10 mL

## 2021-08-23 MED ORDER — FLUOROURACIL CHEMO INJECTION 2.5 GM/50ML
600.0000 mg/m2 | Freq: Once | INTRAVENOUS | Status: AC
  Administered 2021-08-23: 1200 mg via INTRAVENOUS
  Filled 2021-08-23: qty 24

## 2021-08-23 MED ORDER — PALONOSETRON HCL INJECTION 0.25 MG/5ML
0.2500 mg | Freq: Once | INTRAVENOUS | Status: AC
  Administered 2021-08-23: 0.25 mg via INTRAVENOUS
  Filled 2021-08-23: qty 5

## 2021-08-23 MED ORDER — SODIUM CHLORIDE 0.9 % IV SOLN
600.0000 mg/m2 | Freq: Once | INTRAVENOUS | Status: AC
  Administered 2021-08-23: 1220 mg via INTRAVENOUS
  Filled 2021-08-23: qty 61

## 2021-08-23 MED ORDER — SODIUM CHLORIDE 0.9 % IV SOLN
10.0000 mg | Freq: Once | INTRAVENOUS | Status: AC
  Administered 2021-08-23: 10 mg via INTRAVENOUS
  Filled 2021-08-23: qty 10

## 2021-08-23 MED ORDER — SODIUM CHLORIDE 0.9% FLUSH
10.0000 mL | INTRAVENOUS | Status: DC | PRN
  Administered 2021-08-23: 10 mL

## 2021-08-23 NOTE — Assessment & Plan Note (Addendum)
Left breast cancer invasive ductal carcinoma: 0.5 cm with 9 mm area of DCIS status post left mastectomy 01/04/2012 ER/PR positive HER-2 negative Ki-67 17% currently on Arimidex 1 mg daily ?Since 02/07/2012.??Completed 7 years of October 2020 ??(cecal adenocarcinoma diagnosed 04/22/2013, preop CEA of 5.1, status post right hemicolectomy 04/28/2014, grade 2, no MVI, 0/25 lymph nodes, subserosal involvement, T3 N0 M0 stage II A, no mismatch repair) ?? ?Contralateral breast cancer: ?05/19/2021: Right breast mammogram and ultrasound: 1.6 cm mass in the right breast 12 o'clock position with architectural distortion highly suspicious for malignancy, no axillary lymph nodes, biopsy: Grade 3 IDC ER 0%, PR 0%, HER2 negative 1+ by IHC, Ki-67 20%? ?? ?06/23/2021:Right mastectomy: Grade 3 IDC with DCIS, 3.4 cm, margins negative, 2/3 lymph nodes positive ER 0%, PR 0%, HER2 negative, Ki-67 20% ??? ?CT CAP and bone scan: 06/08/2021: Multiple small lung nodules stable with the exception of new 4 mm left lower lobe lung nodule and a 6 mm right lower lobe lung nodule open (nonspecific) ?? ?Treatment plan: ?1. Adjuvant chemotherapy with CMF x6 cycles ?2. Adjuvant radiation ?------------------------------------------------------------------------------------------------------- ?Current treatment: Cycle 2 CMF ?Chemotoxicities: ?1.  Mild nausea for couple of days ?2. Fatigue ?Overall she tolerated the treatment extremely well. ? ?Return to clinic in 3 weeks for cycle 3 ?

## 2021-08-23 NOTE — Patient Instructions (Addendum)
[facility]  Discharge Instructions: ?Thank you for choosing [facility] to provide your oncology and hematology care.  ? ?If you have a lab appointment with the [facility], please go directly to the [facility] and check in at the registration area. ?  ?Wear comfortable clothing and clothing appropriate for easy access to any Portacath or PICC line.  ? ?We strive to give you quality time with your provider. You may need to reschedule your appointment if you arrive late (15 or more minutes).  Arriving late affects you and other patients whose appointments are after yours.  Also, if you miss three or more appointments without notifying the office, you may be dismissed from the clinic at the provider?s discretion.    ?  ?For prescription refill requests, have your pharmacy contact our office and allow 72 hours for refills to be completed.   ? ?Today you received the following chemotherapy and/or immunotherapy agents cyclophosphamide, methotrexate, fluorourcil    ?  ?To help prevent nausea and vomiting after your treatment, we encourage you to take your nausea medication as directed. ? ?BELOW ARE SYMPTOMS THAT SHOULD BE REPORTED IMMEDIATELY: ?*FEVER GREATER THAN 100.4 F (38 ?C) OR HIGHER ?*CHILLS OR SWEATING ?*NAUSEA AND VOMITING THAT IS NOT CONTROLLED WITH YOUR NAUSEA MEDICATION ?*UNUSUAL SHORTNESS OF BREATH ?*UNUSUAL BRUISING OR BLEEDING ?*URINARY PROBLEMS (pain or burning when urinating, or frequent urination) ?*BOWEL PROBLEMS (unusual diarrhea, constipation, pain near the anus) ?TENDERNESS IN MOUTH AND THROAT WITH OR WITHOUT PRESENCE OF ULCERS (sore throat, sores in mouth, or a toothache) ?UNUSUAL RASH, SWELLING OR PAIN  ?UNUSUAL VAGINAL DISCHARGE OR ITCHING  ? ?Items with * indicate a potential emergency and should be followed up as soon as possible or go to the Emergency Department if any problems should occur. ? ?Please show the CHEMOTHERAPY ALERT CARD or  IMMUNOTHERAPY ALERT CARD at check-in to the Emergency Department and triage nurse. ? ?Should you have questions after your visit or need to cancel or reschedule your appointment, please contact [facility]  Dept: [phone]  and follow the prompts.  Office hours are 8:00 a.m. to 4:30 p.m. Monday - Friday. Please note that voicemails left after 4:00 p.m. may not be returned until the following business day.  We are closed weekends and major holidays. You have access to a nurse at all times for urgent questions. Please call the main number to the clinic Dept: [phone] and follow the prompts. ? ? ?For any non-urgent questions, you may also contact your provider using MyChart. We now offer e-Visits for anyone 25 and older to request care online for non-urgent symptoms. For details visit [email or web address]. ?  ?Also download the MyChart app! Go to the app store, search "MyChart", open the app, select [facility], and log in with your MyChart username and password. ? ?Due to Covid, a mask is required upon entering the hospital/clinic. If you do not have a mask, one will be given to you upon arrival. For doctor visits, patients may have 1 support person aged 67 or older with them. For treatment visits, patients cannot have anyone with them due to current Covid guidelines and our immunocompromised population.  ? ?

## 2021-08-24 ENCOUNTER — Inpatient Hospital Stay: Payer: Medicare PPO

## 2021-08-24 ENCOUNTER — Inpatient Hospital Stay: Payer: Medicare PPO | Admitting: Adult Health

## 2021-08-25 ENCOUNTER — Telehealth: Payer: Self-pay | Admitting: Hematology and Oncology

## 2021-08-25 NOTE — Telephone Encounter (Signed)
Scheduled appointment per 5/10 los. Patient is aware. ?

## 2021-08-26 ENCOUNTER — Ambulatory Visit: Payer: Medicare PPO

## 2021-08-26 DIAGNOSIS — R293 Abnormal posture: Secondary | ICD-10-CM | POA: Diagnosis not present

## 2021-08-26 DIAGNOSIS — M25511 Pain in right shoulder: Secondary | ICD-10-CM

## 2021-08-26 DIAGNOSIS — Z9011 Acquired absence of right breast and nipple: Secondary | ICD-10-CM | POA: Diagnosis not present

## 2021-08-26 DIAGNOSIS — M25611 Stiffness of right shoulder, not elsewhere classified: Secondary | ICD-10-CM | POA: Diagnosis not present

## 2021-08-26 DIAGNOSIS — M25512 Pain in left shoulder: Secondary | ICD-10-CM | POA: Diagnosis not present

## 2021-08-26 NOTE — Therapy (Signed)
?OUTPATIENT PHYSICAL THERAPY TREATMENT NOTE ? ? ?Patient Name: Ann Maxwell ?MRN: [id number] ?DOB:Jun 09, 1940, 81 y.o., female ?Today's Date: 08/26/2021 ? ?PCP: Donnajean Lopes, MD ?REFERRING PROVIDER: Stark Klein, MD ? ?END OF SESSION:  ? PT End of Session - 08/26/21 1006   ? ? Visit Number 10   ? Number of Visits 12   ? Date for PT Re-Evaluation 08/30/21   ? Authorization Type [employer]   ? Authorization Time Period 07/19/2021-08/30/2021   ? Authorization - Visit Number 10   ? Authorization - Number of Visits 12   ? PT Start Time 1006   ? PT Stop Time 1051   ? PT Time Calculation (min) 45 min   ? ?  ?  ? ?  ? ? ? ?Past Medical History:  ?Diagnosis Date  ? Allergy   ? Anemia   ? iron infusion -last done 1 month ago(12-15, and 12-22 -40 [facility])- Dr. Lindi Adie  ? Anxiety   ? Asthma   ? Breast CA ([place]) 2013  ? a. L breast DCIS, s/p L mastectomy 12/2011. no further issues  ? Breast cancer ([place]) 2023  ? Right breast IDC  ? Cataract   ? bilat removed   ? Colon cancer ([place])   ? Depression   ? pt denies any hx of depression   ? Environmental allergies   ? History of kidney stones   ? Hypertension   ? controlled on meds   ? Internal hemorrhoids   ? remains an issue- no bright bleeding seen recent  ? Iron deficiency anemia   ? Obesity   ? Osteoarthritis   ? arthritis- knees and shoulders  ? Osteoporosis   ? drug induced and on Fosamax  ? Transfusion history   ? 2 yrs ago due to anemia  ? Vertigo   ? no recent issues  ? ?Past Surgical History:  ?Procedure Laterality Date  ? BREAST BIOPSY Right 05/27/2021  ? U/S Bx  ? BREAST SURGERY    ? CATARACT EXTRACTION, BILATERAL  2019  ? CHOLECYSTECTOMY    ? COLON RESECTION N/A 04/28/2014  ? Procedure: LAPAROSCOPIC  RIGHT HEMI-COLECTOMY;  Surgeon: Stark Klein, MD;  [facility];  Service: General;  Laterality: N/A;  ? COLONOSCOPY    ? Lithectomy  2010  ? LITHOTRIPSY    ? MASTECTOMY Left 2013  ? MASTECTOMY W/ SENTINEL NODE BIOPSY  01/04/2012  ? Procedure: MASTECTOMY WITH SENTINEL LYMPH NODE  BIOPSY;  Surgeon: Stark Klein, MD;  Location: [place] [facility];  Service: General;  Laterality: Left;  ? ORIF TIBIA & FIBULA FRACTURES    ? wtih subsequent pin removal in 1997  ? ORIF ULNAR / RADIAL SHAFT FRACTURE Left   ? POLYPECTOMY    ? PORTACATH PLACEMENT Left 06/23/2021  ? Procedure: INSERTION PORT-A-CATH;  Surgeon: Stark Klein, MD;  Location: [facility];  Service: General;  Laterality: Left;  ? SENTINEL NODE BIOPSY Right 06/23/2021  ? Procedure: SENTINEL LYMPH NODE BIOPSY;  Surgeon: Stark Klein, MD;  Location: [facility];  Service: General;  Laterality: Right;  ? SIMPLE MASTECTOMY WITH AXILLARY SENTINEL NODE BIOPSY Right 06/23/2021  ? Procedure: RIGHT MASTECTOMY;  Surgeon: Stark Klein, MD;  Location: [facility];  Service: General;  Laterality: Right;  ? TONSILLECTOMY AND ADENOIDECTOMY    ? TOTAL KNEE ARTHROPLASTY Left 08/23/2015  ? Procedure: TOTAL KNEE ARTHROPLASTY;  Surgeon: Gaynelle Arabian, MD;  [facility];  Service: Orthopedics;  Laterality: Left;  ? VAGINAL HYSTERECTOMY  20 yrs. ago  ? with BSO  ? ?Patient Active Problem List  ? Diagnosis Date Noted  ? Port-A-Cath in place 08/01/2021  ? Genetic testing 07/26/2021  ? Family history of ovarian cancer 07/15/2021  ? Cancer of central portion of right breast ([place]) 06/23/2021  ? Malignant neoplasm of central portion of right breast in female, estrogen receptor negative ([place]) 06/01/2021  ? Pain in right knee 06/12/2019  ? Asthma 10/17/2017  ? OA (osteoarthritis) of knee 08/23/2015  ? Primary colon cancer ([place]) 03/31/2014  ? Iron deficiency anemia due to chronic blood loss 07/01/2013  ? Sinus tachycardia 09/12/2012  ? Mobitz type 1 second degree AV block 09/12/2012  ? Chest pain 09/11/2012  ? HTN (hypertension) 09/11/2012  ? Asthma, chronic 09/11/2012  ? Primary cancer of lower outer quadrant of left female breast ([place]) 12/12/2011  ? ? ?REFERRING DIAG: s/p Right mastectomy ? ?THERAPY DIAG:  ?Status post right  mastectomy ? ?Abnormal posture ? ?Stiffness of right shoulder, not elsewhere classified ? ?Bilateral shoulder pain, unspecified chronicity ? ?PERTINENT HISTORY:  ?06/23/2021 Surgery Right mastectomy: Grade 3 IDC with DCIS, 3.4 cm, margins negative, 2/3 lymph nodes positive ER 0%, PR 0%, HER2 negative, Ki-67 20%  ?  ?01/04/2012 Surgery  ?  Left mastectomy: Invasive ductal carcinoma 0.5 cm with high-grade DCIS 9 cm, margins negative, 0/6 lymph nodes, ER 100%, PR 100%, Ki-67 17%, HER-2   ?Pt also has a history of surgery for Colon cancer in 2016 ? ? ?PRECAUTIONS: Other: Lymphedema risk bilaterally, Right knee pain , HBP ?  ? ?SUBJECTIVE:  ?Had a rough day yesterday after infusion on Tuesday. I am a little better today.  Shoulder is without pain today. I missed a day and a half of exercises but got back to them yesterday. Shoulder pain is 80% better since the beginning. Still have some crepitus but it doesn't hurt. Doing better putting on deoderant. ? ?PAIN:  ?Are you having pain? No,  ? ? ? ?OBJECTIVE ?  ?COGNITION: ?           Overall cognitive status: Within functional limits for tasks assessed  ?  ?PALPATION: Soft swelling inferior to right mastectomy incision ?  ?OBSERVATIONS / OTHER ASSESSMENTS: incision healing well. Glue still present over mastectomy incision, drain incision still not fully healed. Swelling noted around incision ?  ?SENSATION: ?           Light touch: Appears intact, not yet normal on right ?            ?  ?POSTURE: forward head, rounded shoulders ?  ?UPPER EXTREMITY AROM/PROM: ?  ?A/PROM RIGHT  07/18/2021 ?  08/01/2021 08/18/2021  ?Shoulder extension 57    ?Shoulder flexion 112 pain with lowering 120 with pop 124 discomfort with lowering  ?Shoulder abduction 76 pulls in chest 102 102 with slight scaption, difficulty lowering  ?Shoulder internal rotation      ?Shoulder external rotation      ?                        (Blank rows = not tested) ?  ?A/PROM LEFT  07/18/2021  ?Shoulder extension 62  ?Shoulder  flexion 152  ?Shoulder abduction 152  ?Shoulder internal rotation    ?Shoulder external rotation 90  ?                        (Blank rows = not tested) ?  ?  ?  CERVICAL AROM: ?All within functional limits: left rotation limited slightly more than right, C6 disc ?  ?  ?  ?  ?UPPER EXTREMITY STRENGTH: NT on right, WFL on left ?  ?  ?  ?LYMPHEDEMA ASSESSMENTS:  ?  ?SURGERY TYPE/DATE: Right Mastectomy with SLNB, 06/23/2021 , Left Mastectomy with SLNB 01/04/2012 ?  ?NUMBER OF LYMPH NODES REMOVED: Right 2+/3, left 0/6 ?  ?CHEMOTHERAPY: pending ?  ?RADIATION:pending ?  ?HORMONE TREATMENT: prior hormone treatment ?  ?INFECTIONS: no ?  ?LYMPHEDEMA ASSESSMENTS:  ?  ?[place] RIGHT  07/18/2021  ?10 cm proximal to olecranon process 37.4 (from front of elbow)  ?Olecranon process 29.0  ?10 cm proximal to ulnar styloid process 24.5  ?Just proximal to ulnar styloid process 19.5  ?Across hand at thumb web space 19.8  ?At base of 2nd digit 6.3  ?(Blank rows = not tested) ?  ?[place] LEFT  07/18/2021  ?10 cm proximal to olecranon process 38.6 front of elbow  ?Olecranon process 30.3  ?10 cm proximal to ulnar styloid process 24.8  ?Just proximal to ulnar styloid process 19.9  ?Across hand at thumb web space 19.7  ?At base of 2nd digit 6.3  ?(Blank rows = not tested) ?  ?  ?  ?  ?  ?QUICK DASH SURVEY: 36.36% ?  ?  ?TODAY'S TREATMENT  ? ?08/26/2021 ?MANUAL THERAPY: ?MFR: In Supine to Rt chest wall . LEZ:VGJF cocoa butter to Rt UT, right pectorals, lats in supine and in left SL to right UT and periscapular area ?PROM: Gently to right shoulder flexion, and scaption, and abduction with scapular depression throughout and no popping. ?Therapeutic Exs:  ?AA shoulder flexion and star gazer x 5 ?Supine serratus punch x 15 with 1#, Rhythmic stabs  with 1# at 90 degrees flexion with PT resistance in supine 2 x 30 sec, Alternating isometrics IR and ER 2 x 20 seconds. Serratus punch 1# x 15, supine horizontal abduction and ER with red band x 10  ea ? ? ? ? ?08/22/2021 ?Therapeutic Exs:  ?Supine serratus punch x 15 with 1#, Rhythmic stabs at 90 degrees flexion with PT resistance in supine 2 x 30 sec, Alternating isometrics IR and ER 2 x 20 seconds. Serratus punch 1# x 15,

## 2021-08-29 ENCOUNTER — Ambulatory Visit: Payer: Medicare PPO

## 2021-08-29 DIAGNOSIS — M25611 Stiffness of right shoulder, not elsewhere classified: Secondary | ICD-10-CM | POA: Diagnosis not present

## 2021-08-29 DIAGNOSIS — M25511 Pain in right shoulder: Secondary | ICD-10-CM

## 2021-08-29 DIAGNOSIS — Z9011 Acquired absence of right breast and nipple: Secondary | ICD-10-CM

## 2021-08-29 DIAGNOSIS — M25512 Pain in left shoulder: Secondary | ICD-10-CM | POA: Diagnosis not present

## 2021-08-29 DIAGNOSIS — R293 Abnormal posture: Secondary | ICD-10-CM

## 2021-08-29 NOTE — Therapy (Addendum)
?OUTPATIENT PHYSICAL THERAPY TREATMENT NOTE ? ? ?Patient Name: Ann Maxwell ?MRN: [id number] ?DOB:06-Jan-1941, 81 y.o., female ?Today's Date: 08/30/2021 ? ?PCP: Donnajean Lopes, MD ?REFERRING PROVIDER: Stark Klein, MD ? ?END OF SESSION:  ? PT End of Session - 08/29/21 1004   ? ? Visit Number 11   ? Number of Visits 19   ? Date for PT Re-Evaluation 09/26/21   ? Authorization Type [employer]   ? Authorization Time Period 07/19/2021-08/30/2021  ? Authorization - Visit Number 11   ? Authorization - Number of Visits 12   ? PT Start Time 1004   ? PT Stop Time 1054   ? PT Time Calculation (min) 50 min   ? Activity Tolerance Patient tolerated treatment well   ? Behavior During Therapy [facility] for tasks assessed/performed   ? ?  ?  ? ?  ? ? ? ?Past Medical History:  ?Diagnosis Date  ? Allergy   ? Anemia   ? iron infusion -last done 1 month ago(12-15, and 12-22 -56 [facility])- Dr. Lindi Adie  ? Anxiety   ? Asthma   ? Breast CA ([place]) 2013  ? a. L breast DCIS, s/p L mastectomy 12/2011. no further issues  ? Breast cancer ([place]) 2023  ? Right breast IDC  ? Cataract   ? bilat removed   ? Colon cancer ([place])   ? Depression   ? pt denies any hx of depression   ? Environmental allergies   ? History of kidney stones   ? Hypertension   ? controlled on meds   ? Internal hemorrhoids   ? remains an issue- no bright bleeding seen recent  ? Iron deficiency anemia   ? Obesity   ? Osteoarthritis   ? arthritis- knees and shoulders  ? Osteoporosis   ? drug induced and on Fosamax  ? Transfusion history   ? 2 yrs ago due to anemia  ? Vertigo   ? no recent issues  ? ?Past Surgical History:  ?Procedure Laterality Date  ? BREAST BIOPSY Right 05/27/2021  ? U/S Bx  ? BREAST SURGERY    ? CATARACT EXTRACTION, BILATERAL  2019  ? CHOLECYSTECTOMY    ? COLON RESECTION N/A 04/28/2014  ? Procedure: LAPAROSCOPIC  RIGHT HEMI-COLECTOMY;  Surgeon: Stark Klein, MD;  [facility];  Service: General;  Laterality: N/A;  ? COLONOSCOPY    ? Lithectomy  2010  ? LITHOTRIPSY    ?  MASTECTOMY Left 2013  ? MASTECTOMY W/ SENTINEL NODE BIOPSY  01/04/2012  ? Procedure: MASTECTOMY WITH SENTINEL LYMPH NODE BIOPSY;  Surgeon: Stark Klein, MD;  Location: [place];  Service: General;  Laterality: Left;  ? ORIF TIBIA & FIBULA FRACTURES    ? wtih subsequent pin removal in 1997  ? ORIF ULNAR / RADIAL SHAFT FRACTURE Left   ? POLYPECTOMY    ? PORTACATH PLACEMENT Left 06/23/2021  ? Procedure: INSERTION PORT-A-CATH;  Surgeon: Stark Klein, MD;  Location: [place];  Service: General;  Laterality: Left;  ? SENTINEL NODE BIOPSY Right 06/23/2021  ? Procedure: SENTINEL LYMPH NODE BIOPSY;  Surgeon: Stark Klein, MD;  Location: [place];  Service: General;  Laterality: Right;  ? SIMPLE MASTECTOMY WITH AXILLARY SENTINEL NODE BIOPSY Right 06/23/2021  ? Procedure: RIGHT MASTECTOMY;  Surgeon: Stark Klein, MD;  Location: [place];  Service: General;  Laterality: Right;  ? TONSILLECTOMY AND ADENOIDECTOMY    ? TOTAL KNEE ARTHROPLASTY Left 08/23/2015  ? Procedure: TOTAL KNEE ARTHROPLASTY;  Surgeon: Gaynelle Arabian, MD;  [facility];  Service: Orthopedics;  Laterality: Left;  ? VAGINAL HYSTERECTOMY  20 yrs. ago  ? with BSO  ? ?Patient Active Problem List  ? Diagnosis Date Noted  ? Port-A-Cath in place 08/01/2021  ? Genetic testing 07/26/2021  ? Family history of ovarian cancer 07/15/2021  ? Cancer of central portion of right breast ([place]) 06/23/2021  ? Malignant neoplasm of central portion of right breast in female, estrogen receptor negative ([place]) 06/01/2021  ? Pain in right knee 06/12/2019  ? Asthma 10/17/2017  ? OA (osteoarthritis) of knee 08/23/2015  ? Primary colon cancer ([place]) 03/31/2014  ? Iron deficiency anemia due to chronic blood loss 07/01/2013  ? Sinus tachycardia 09/12/2012  ? Mobitz type 1 second degree AV block 09/12/2012  ? Chest pain 09/11/2012  ? HTN (hypertension) 09/11/2012  ? Asthma, chronic 09/11/2012  ? Primary cancer of lower outer quadrant of left  female breast ([place]) 12/12/2011  ? ? ?REFERRING DIAG: s/p Right mastectomy ? ?THERAPY DIAG:  ?Status post right mastectomy - Plan: PT plan of care cert/re-cert ? ?Abnormal posture - Plan: PT plan of care cert/re-cert ? ?Stiffness of right shoulder, not elsewhere classified - Plan: PT plan of care cert/re-cert ? ?Bilateral shoulder pain, unspecified chronicity - Plan: PT plan of care cert/re-cert ? ?PERTINENT HISTORY:  ?06/23/2021 Surgery Right mastectomy: Grade 3 IDC with DCIS, 3.4 cm, margins negative, 2/3 lymph nodes positive ER 0%, PR 0%, HER2 negative, Ki-67 20%  ?  ?01/04/2012 Surgery  ?  Left mastectomy: Invasive ductal carcinoma 0.5 cm with high-grade DCIS 9 cm, margins negative, 0/6 lymph nodes, ER 100%, PR 100%, Ki-67 17%, HER-2   ?Pt also has a history of surgery for Colon cancer in 2016 ? ? ?PRECAUTIONS: Other: Lymphedema risk bilaterally, Right knee pain , HBP ?  ? ?SUBJECTIVE: I feel like I have good mobility but strength is still a problem. Doing better getting deoderant on, but its still hard. I am pleased with my PROM.  It is still difficult to place dishes on the 2nd shelf and I have to compensate to do it.  I am still getting a lot of crepitus in my shoulder. ? ? ?PAIN:  ?Are you having pain? No pain at rest or with activity ? ? ? ?OBJECTIVE ?  ?COGNITION: ?           Overall cognitive status: Within functional limits for tasks assessed  ?  ?PALPATION: Soft swelling inferior to right mastectomy incision ?  ?OBSERVATIONS / OTHER ASSESSMENTS: incision healing well. Glue still present over mastectomy incision, drain incision still not fully healed. Swelling noted around incision ?  ?SENSATION: ?           Light touch: Appears intact, not yet normal on right ?            ?  ?POSTURE: forward head, rounded shoulders ?  ?UPPER EXTREMITY AROM/PROM: ?  ?A/PROM RIGHT  07/18/2021 ?  08/01/2021 08/18/2021 08/29/2021  ?Shoulder extension 57   57  ?Shoulder flexion 112 pain with lowering 120 with pop 124 discomfort with  lowering         124/158 P  ?Shoulder abduction 76 pulls in chest 102 102 with slight scaption, difficulty lowering 90  ?Shoulder internal rotation       ?Shoulder external rotation       ?                        (  Blank rows = not tested) ?  ?A/PROM LEFT  07/18/2021  ?Shoulder extension 62  ?Shoulder flexion 152  ?Shoulder abduction 152  ?Shoulder internal rotation    ?Shoulder external rotation 90  ?                        (Blank rows = not tested) ?  ?  ?CERVICAL AROM: ?All within functional limits: left rotation limited slightly more than right, C6 disc ?  ?  ?  ?  ?UPPER EXTREMITY STRENGTH: NT on right, WFL on left ?  ?  ?  ?LYMPHEDEMA ASSESSMENTS:  ?  ?SURGERY TYPE/DATE: Right Mastectomy with SLNB, 06/23/2021 , Left Mastectomy with SLNB 01/04/2012 ?  ?NUMBER OF LYMPH NODES REMOVED: Right 2+/3, left 0/6 ?  ?CHEMOTHERAPY: pending ?  ?RADIATION:pending ?  ?HORMONE TREATMENT: prior hormone treatment ?  ?INFECTIONS: no ?  ?LYMPHEDEMA ASSESSMENTS:  ?  ?[facility] RIGHT  07/18/2021 08/29/2021  ?10 cm proximal to olecranon process 37.4 (from front of elbow) 37.3  ?Olecranon process 29.0 28.3  ?10 cm proximal to ulnar styloid process 24.5 23.8  ?Just proximal to ulnar styloid process 19.5 18.8  ?Across hand at thumb web space 19.8 19.3  ?At base of 2nd digit 6.3 6.1  ?(Blank rows = not tested) ?  ?[place] LEFT  07/18/2021   ?10 cm proximal to olecranon process 38.6 front of elbow 38.6  ?Olecranon process 30.3 29.0  ?10 cm proximal to ulnar styloid process 24.8 24.0  ?Just proximal to ulnar styloid process 19.9 19.4  ?Across hand at thumb web space 19.7   ?At base of 2nd digit 6.3 6.1  ?(Blank rows = not tested) ?  ?  ?  ?  ?  ?QUICK DASH SURVEY: 36.36% ?  ?  ?TODAY'S TREATMENT  ?08/29/2021 ?MANUAL THERAPY ?MCR:FVOH cocoa butter to Rt UT, right pectorals, lats in supine and in left SL to right UT and periscapular area ?PROM: Gently to right shoulder flexion, and scaption, and abduction with scapular depression throughout ?Remeasured  arm circumference and shoulder ROM for recert. Discussed goals  ?Therapeutic Exs:  ?AA shoulder flexion and star gazer x 5 ea ? ?08/26/2021 ?MANUAL THERAPY: ?MFR: In Supine to Rt chest wall . KGO:VPCH cocoa bu

## 2021-09-01 ENCOUNTER — Ambulatory Visit: Payer: Medicare PPO

## 2021-09-01 DIAGNOSIS — R293 Abnormal posture: Secondary | ICD-10-CM

## 2021-09-01 DIAGNOSIS — M25611 Stiffness of right shoulder, not elsewhere classified: Secondary | ICD-10-CM

## 2021-09-01 DIAGNOSIS — Z9011 Acquired absence of right breast and nipple: Secondary | ICD-10-CM | POA: Diagnosis not present

## 2021-09-01 DIAGNOSIS — M25511 Pain in right shoulder: Secondary | ICD-10-CM | POA: Diagnosis not present

## 2021-09-01 DIAGNOSIS — M25512 Pain in left shoulder: Secondary | ICD-10-CM | POA: Diagnosis not present

## 2021-09-01 NOTE — Therapy (Addendum)
OUTPATIENT PHYSICAL THERAPY TREATMENT NOTE   Patient Name: Ann Maxwell MRN: [id number] DOB:1940-10-02, 81 y.o., female Today's Date: 09/01/2021  PCP: Donnajean Lopes, MD REFERRING PROVIDER: Stark Klein, MD  END OF SESSION:   PT End of Session - 09/01/21 1004     Visit Number 12 (1)   Number of Visits 39  (8) for new authorization   Date for PT Re-Evaluation 09/26/21    Authorization Type [employer]    Authorization Time Period , 07/19/21- 08/30/2021, 09/01/21-09/26/21(8 visits)   Authorization - Visit Number 1    Authorization - Number of Visits  8   PT Start Time 1004    PT Stop Time 1054    PT Time Calculation (min) 50 min    Activity Tolerance Patient tolerated treatment well    Behavior During Therapy [facility] for tasks assessed/performed              Past Medical History:  Diagnosis Date   Allergy    Anemia    iron infusion -last done 1 month ago(12-15, and 12-22 -28 [facility])- Dr. Lindi Adie   Anxiety    Asthma    Breast CA ([place]) 2013   a. L breast DCIS, s/p L mastectomy 12/2011. no further issues   Breast cancer ([place]) 2023   Right breast IDC   Cataract    bilat removed    Colon cancer ([place])    Depression    pt denies any hx of depression    Environmental allergies    History of kidney stones    Hypertension    controlled on meds    Internal hemorrhoids    remains an issue- no bright bleeding seen recent   Iron deficiency anemia    Obesity    Osteoarthritis    arthritis- knees and shoulders   Osteoporosis    drug induced and on Fosamax   Transfusion history    2 yrs ago due to anemia   Vertigo    no recent issues   Past Surgical History:  Procedure Laterality Date   BREAST BIOPSY Right 05/27/2021   U/S Bx   BREAST SURGERY     CATARACT EXTRACTION, BILATERAL  2019   CHOLECYSTECTOMY     COLON RESECTION N/A 04/28/2014   Procedure: LAPAROSCOPIC  RIGHT HEMI-COLECTOMY;  Surgeon: Stark Klein, MD;  [facility];  Service: General;  Laterality: N/A;    COLONOSCOPY     Lithectomy  2010   LITHOTRIPSY     MASTECTOMY Left 2013   MASTECTOMY W/ SENTINEL NODE BIOPSY  01/04/2012   Procedure: MASTECTOMY WITH SENTINEL LYMPH NODE BIOPSY;  Surgeon: Stark Klein, MD;  Location: [place];  Service: General;  Laterality: Left;   ORIF [place]     wtih subsequent pin removal in 1997   ORIF ULNAR / RADIAL SHAFT FRACTURE Left    POLYPECTOMY     PORTACATH PLACEMENT Left 06/23/2021   Procedure: INSERTION PORT-A-CATH;  Surgeon: Stark Klein, MD;  Location: [place];  Service: General;  Laterality: Left;   SENTINEL NODE BIOPSY Right 06/23/2021   Procedure: SENTINEL LYMPH NODE BIOPSY;  Surgeon: Stark Klein, MD;  Location: [place];  Service: General;  Laterality: Right;   SIMPLE MASTECTOMY WITH AXILLARY SENTINEL NODE BIOPSY Right 06/23/2021   Procedure: RIGHT MASTECTOMY;  Surgeon: Stark Klein, MD;  Location: [place];  Service: General;  Laterality: Right;   TONSILLECTOMY AND ADENOIDECTOMY     TOTAL KNEE ARTHROPLASTY Left  08/23/2015   Procedure: TOTAL KNEE ARTHROPLASTY;  Surgeon: Gaynelle Arabian, MD;  [facility];  Service: Orthopedics;  Laterality: Left;   VAGINAL HYSTERECTOMY  20 yrs. ago   with BSO   Patient Active Problem List   Diagnosis Date Noted   Port-A-Cath in place 08/01/2021   Genetic testing 07/26/2021   Family history of ovarian cancer 07/15/2021   Cancer of central portion of right breast ([place]) 06/23/2021   Malignant neoplasm of central portion of right breast in female, estrogen receptor negative ([place]) 06/01/2021   Pain in right knee 06/12/2019   Asthma 10/17/2017   OA (osteoarthritis) of knee 08/23/2015   Primary colon cancer ([place]) 03/31/2014   Iron deficiency anemia due to chronic blood loss 07/01/2013   Sinus tachycardia 09/12/2012   Mobitz type 1 second degree AV block 09/12/2012   Chest pain 09/11/2012   HTN (hypertension) 09/11/2012   Asthma, chronic 09/11/2012    Primary cancer of lower outer quadrant of left female breast ([place]) 12/12/2011    REFERRING DIAG: s/p Right mastectomy  THERAPY DIAG:  Status post right mastectomy  Abnormal posture  Stiffness of right shoulder, not elsewhere classified  Bilateral shoulder pain, unspecified chronicity  PERTINENT HISTORY:  06/23/2021 Surgery Right mastectomy: Grade 3 IDC with DCIS, 3.4 cm, margins negative, 2/3 lymph nodes positive ER 0%, PR 0%, HER2 negative, Ki-67 20%    01/04/2012 Surgery    Left mastectomy: Invasive ductal carcinoma 0.5 cm with high-grade DCIS 9 cm, margins negative, 0/6 lymph nodes, ER 100%, PR 100%, Ki-67 17%, HER-2   Pt also has a history of surgery for Colon cancer in 2016   PRECAUTIONS: Other: Lymphedema risk bilaterally, Right knee pain , HBP    SUBJECTIVE:   I was so fatigued yesterday. I feel better today. Both shoulders hurt last night at the top of the shoulder and I put CBD oil on them and now they are fine.   PAIN:  Are you having pain? No pain at rest or with activity presently.    OBJECTIVE   COGNITION:            Overall cognitive status: Within functional limits for tasks assessed    PALPATION: Soft swelling inferior to right mastectomy incision   OBSERVATIONS / OTHER ASSESSMENTS: incision healing well. Glue still present over mastectomy incision, drain incision still not fully healed. Swelling noted around incision   SENSATION:            Light touch: Appears intact, not yet normal on right               POSTURE: forward head, rounded shoulders   UPPER EXTREMITY AROM/PROM:   A/PROM RIGHT  07/18/2021   08/01/2021 08/18/2021 08/29/2021  Shoulder extension 57   57  Shoulder flexion 112 pain with lowering 120 with pop 124 discomfort with lowering         124/158 P  Shoulder abduction 76 pulls in chest 102 102 with slight scaption, difficulty lowering 90  Shoulder internal rotation       Shoulder external rotation                               (Blank  rows = not tested)   A/PROM LEFT  07/18/2021  Shoulder extension 62  Shoulder flexion 152  Shoulder abduction 152  Shoulder internal rotation    Shoulder external rotation 90                          (  Blank rows = not tested)     CERVICAL AROM: All within functional limits: left rotation limited slightly more than right, C6 disc         UPPER EXTREMITY STRENGTH: NT on right, WFL on left       LYMPHEDEMA ASSESSMENTS:    SURGERY TYPE/DATE: Right Mastectomy with SLNB, 06/23/2021 , Left Mastectomy with SLNB 01/04/2012   NUMBER OF LYMPH NODES REMOVED: Right 2+/3, left 0/6   CHEMOTHERAPY: pending   RADIATION:pending   HORMONE TREATMENT: prior hormone treatment   INFECTIONS: no   LYMPHEDEMA ASSESSMENTS:    [place] RIGHT  07/18/2021 08/29/2021  10 cm proximal to olecranon process 37.4 (from front of elbow) 37.3  Olecranon process 29.0 28.3  10 cm proximal to ulnar styloid process 24.5 23.8  Just proximal to ulnar styloid process 19.5 18.8  Across hand at thumb web space 19.8 19.3  At base of 2nd digit 6.3 6.1  (Blank rows = not tested)   [place] LEFT  07/18/2021   10 cm proximal to olecranon process 38.6 front of elbow 38.6  Olecranon process 30.3 29.0  10 cm proximal to ulnar styloid process 24.8 24.0  Just proximal to ulnar styloid process 19.9 19.4  Across hand at thumb web space 19.7   At base of 2nd digit 6.3 6.1  (Blank rows = not tested)           QUICK DASH SURVEY: 36.36%     TODAY'S TREATMENT  09/01/2021 MANUAL TIR:WERX cocoa butter to Rt UT, right pectorals, lats in supine and in left SL to right UT and periscapular area Therapeutic Exs:  AA shoulder flexion and star gazer x 5 ea Supine serratus punch x 15 with 1#, Rhythmic stabs  with 1# at 90 degrees flexion with PT resistance in supine 2 x 30 sec, Alternating isometrics IR and ER  1# 2 x 20 seconds. Serratus punch 1# x 15, chest press 1 # x 10, supine bilateral shoulder flexion and scaption x 10 Towel  roll in thoracic spine for chest stretch x 30 sec, Finger ladder x 3 Reach to top of water cooler while depressing scapula x 10 Scapular depression in sitting x 15 with manual cues Signed pt up for sleeve measuring next week. 08/29/2021 MANUAL THERAPY VQM:GQQP cocoa butter to Rt UT, right pectorals, lats in supine and in left SL to right UT and periscapular area PROM: Gently to right shoulder flexion, and scaption, and abduction with scapular depression throughout Remeasured arm circumference and shoulder ROM for recert. Discussed goals  Therapeutic Exs:  AA shoulder flexion and star gazer x 5 ea  08/26/2021 MANUAL THERAPY: MFR: In Supine to Rt chest wall . YPP:JKDT cocoa butter to Rt UT, right pectorals, lats in supine and in left SL to right UT and periscapular area PROM: Gently to right shoulder flexion, and scaption, and abduction with scapular depression throughout and no popping. Therapeutic Exs:  AA shoulder flexion and star gazer x 5 Supine serratus punch x 15 with 1#, Rhythmic stabs  with 1# at 90 degrees flexion with PT resistance in supine 2 x 30 sec, Alternating isometrics IR and ER 2 x 20 seconds. Serratus punch 1# x 15, supine horizontal abduction and ER with red band x 10 ea     08/22/2021 Therapeutic Exs:  Supine serratus punch x 15 with 1#, Rhythmic stabs at 90 degrees flexion with PT resistance in supine 2 x 30 sec, Alternating isometrics IR and ER 2 x 20 seconds. Serratus punch  1# x 15, supine horizontal abduction and ER with yellow band x 10 ea  MANUAL THERAPY: MFR: In Supine to Rt chest wall . DJM:EQAS cocoa butter to Rt UT, right pectorals, lats in supine and in left SL to right UT and periscapular area PROM: Gently to right shoulder flexion, and scaption, and abduction with scapular depression throughout and no popping.    08/18/2021 Therapeutic Exs:  Supine serratus punch x 15, Rhythmic stabs at 90 degrees flexion with PT resistance in supine 2 x 30 sec,  Alternating isometrics IR and ER 2 x 20 seconds. Supine AROM flexion and scaption with emphasis on scapular depression x 10 in pain free ROM Standing single arm chest stretch on doorway, wall slides x 3 ea  MANUAL THERAPY: MFR: In Supine to Rt chest wall at end Rt shoulder passive motions STM: With cocoa butter to Rt UT, right pectorals, lats in supine and in left SL to right periscapular area PROM: Gently to right shoulder flexion, and scaption, and abduction with scapular depression throughout trying to limit compensation/   PATIENT EDUCATION:  Education details: Scapular strength with yellow theraband  Person educated: Patient Education method: Explanation, verbal cues, handout Education comprehension: verbalized understanding and returned demonstration     HOME EXERCISE PROGRAM: Supine clasped hands flexion, supine stargazer, sitting scapular retraction; Standing Rt shoulder isometrics, was encouraged to try with Lt as well; scap retract and bil UE ext with yellow theraband,[id number] single arm chest stretch and wall slides for flexion   ASSESSMENT:   CLINICAL IMPRESSION: Pt had several small shoulder pops when walking back down the shoulder ladder. She did well with strengthening exercises today, but does have difficulty isolating the scapular muscles to depress the scapula on the right. She will continue to practice at home [place] decreased activity tolerance, decreased knowledge of condition, decreased ROM, decreased strength, impaired UE functional use, postural dysfunction, and pain.    ACTIVITY LIMITATIONS  activities requiring use of right arm for all reaching activities .    PERSONAL FACTORS 3+ comorbidities: cancer reoccurence on opposite side, knee OA, HB  are also affecting patient's functional outcome.      REHAB POTENTIAL: Excellent   CLINICAL DECISION MAKING: Stable/uncomplicated   EVALUATION COMPLEXITY: Low   GOALS: Goals reviewed with patient?  Yes   SHORT TERM GOALS: Target date: 08/30/2021   Independent and compliant with HEP to improve shoulder ROM and strength Baseline: Goal status:  MET        LONG TERM GOALS: Target date: 08/30/2021   Pt will improve right shoulder flexion and abduction to 125 degrees for improved reaching Baseline: flex 124, abd 90(08/29/21) Goal status: ongoing   2.  Pt will have decreased right chest swelling by atleast 50% Baseline: with drawn by MD Goal status: MET   3.  Pt will have improved Quick dash score to no greater than 18% to demonstrate improved function Baseline: 36,    29% 08/29/2021 Goal status: ongoing   4.  Pt will have decreased right UE/chest pain by 50% Baseline: 50% arm, greater for chest Goal status: MET  5. Pt will be able to hold right arm up without support on wall to apply deoderant     BASELINE: uses wall    Goal Status :ongoing    6.  Pt will be able to place a dish on 2nd shelf without UT compensation    BASELINE: uses UT compensation    Goal status ;New  7.  Pt will be  fit for bilateral prophylactic sleeves for decreasing risk of lymphedema  PLAN: PT FREQUENCY: 2x/week   PT DURATION: 4 weeks   PLANNED INTERVENTIONS: Therapeutic exercises, neuromuscular activities, manual techniques, Manual lymphatic drainage, self Care,orthotic fit   PLAN FOR NEXT SESSION:   review HEP chest stretch at doorway and progress as tolerated, supine alphabet , supine AROM flexion and scaption to HEP,soft tissue mobilization to pecs, UT, lats, PROM to right UE,  instruct MLD to right chest,progress strength.May want a compression sleeve.        Claris Pong, PT 09/01/2021, 12:04 PM

## 2021-09-05 ENCOUNTER — Ambulatory Visit: Payer: Medicare PPO

## 2021-09-05 DIAGNOSIS — R293 Abnormal posture: Secondary | ICD-10-CM | POA: Diagnosis not present

## 2021-09-05 DIAGNOSIS — M25511 Pain in right shoulder: Secondary | ICD-10-CM | POA: Diagnosis not present

## 2021-09-05 DIAGNOSIS — M25512 Pain in left shoulder: Secondary | ICD-10-CM | POA: Diagnosis not present

## 2021-09-05 DIAGNOSIS — Z9011 Acquired absence of right breast and nipple: Secondary | ICD-10-CM | POA: Diagnosis not present

## 2021-09-05 DIAGNOSIS — M25611 Stiffness of right shoulder, not elsewhere classified: Secondary | ICD-10-CM | POA: Diagnosis not present

## 2021-09-05 NOTE — Therapy (Addendum)
OUTPATIENT PHYSICAL THERAPY TREATMENT NOTE   Patient Name: Ann Maxwell MRN: [id number] DOB:December 29, 1940, 81 y.o., female Today's Date: 09/05/2021  PCP: Donnajean Lopes, MD REFERRING PROVIDER: Stark Klein, MD  END OF SESSION:   PT End of Session - 09/05/21 1004     Visit Number 13 (3)   Number of Visits 56  (8) for new authorization   Date for PT Re-Evaluation 09/26/21    Authorization Type [employer]    Authorization Time Period , 07/19/21- 08/30/2021, 09/01/21-09/26/21(8 visits)   Authorization - Visit Number 3    Authorization - Number of Visits  8   PT Start Time 1006    PT Stop Time 1051    PT Time Calculation (min) 45 min    Activity Tolerance Patient tolerated treatment well    Behavior During Therapy [facility] for tasks assessed/performed              Past Medical History:  Diagnosis Date   Allergy    Anemia    iron infusion -last done 1 month ago(12-15, and 12-22 -80 [facility])- Dr. Lindi Adie   Anxiety    Asthma    Breast CA ([place]) 2013   a. L breast DCIS, s/p L mastectomy 12/2011. no further issues   Breast cancer ([place]) 2023   Right breast IDC   Cataract    bilat removed    Colon cancer ([place])    Depression    pt denies any hx of depression    Environmental allergies    History of kidney stones    Hypertension    controlled on meds    Internal hemorrhoids    remains an issue- no bright bleeding seen recent   Iron deficiency anemia    Obesity    Osteoarthritis    arthritis- knees and shoulders   Osteoporosis    drug induced and on Fosamax   Transfusion history    2 yrs ago due to anemia   Vertigo    no recent issues   Past Surgical History:  Procedure Laterality Date   BREAST BIOPSY Right 05/27/2021   U/S Bx   BREAST SURGERY     CATARACT EXTRACTION, BILATERAL  2019   CHOLECYSTECTOMY     COLON RESECTION N/A 04/28/2014   Procedure: LAPAROSCOPIC  RIGHT HEMI-COLECTOMY;  Surgeon: Stark Klein, MD;  Location: [facility];  Service: General;  Laterality: N/A;    COLONOSCOPY     Lithectomy  2010   LITHOTRIPSY     MASTECTOMY Left 2013   MASTECTOMY W/ SENTINEL NODE BIOPSY  01/04/2012   Procedure: MASTECTOMY WITH SENTINEL LYMPH NODE BIOPSY;  Surgeon: Stark Klein, MD;  Location: [place];  Service: General;  Laterality: Left;   ORIF [place]     wtih subsequent pin removal in 1997   ORIF ULNAR / RADIAL SHAFT FRACTURE Left    POLYPECTOMY     PORTACATH PLACEMENT Left 06/23/2021   Procedure: INSERTION PORT-A-CATH;  Surgeon: Stark Klein, MD;  Location: [place];  Service: General;  Laterality: Left;   SENTINEL NODE BIOPSY Right 06/23/2021   Procedure: SENTINEL LYMPH NODE BIOPSY;  Surgeon: Stark Klein, MD;  Location: [place];  Service: General;  Laterality: Right;   SIMPLE MASTECTOMY WITH AXILLARY SENTINEL NODE BIOPSY Right 06/23/2021   Procedure: RIGHT MASTECTOMY;  Surgeon: Stark Klein, MD;  Location: [place];  Service: General;  Laterality: Right;   TONSILLECTOMY AND ADENOIDECTOMY     TOTAL KNEE ARTHROPLASTY Left  08/23/2015   Procedure: TOTAL KNEE ARTHROPLASTY;  Surgeon: Gaynelle Arabian, MD;  Location: [facility];  Service: Orthopedics;  Laterality: Left;   VAGINAL HYSTERECTOMY  20 yrs. ago   with BSO   Patient Active Problem List   Diagnosis Date Noted   Port-A-Cath in place 08/01/2021   Genetic testing 07/26/2021   Family history of ovarian cancer 07/15/2021   Cancer of central portion of right breast ([place]) 06/23/2021   Malignant neoplasm of central portion of right breast in female, estrogen receptor negative ([place]) 06/01/2021   Pain in right knee 06/12/2019   Asthma 10/17/2017   OA (osteoarthritis) of knee 08/23/2015   Primary colon cancer ([place]) 03/31/2014   Iron deficiency anemia due to chronic blood loss 07/01/2013   Sinus tachycardia 09/12/2012   Mobitz type 1 second degree AV block 09/12/2012   Chest pain 09/11/2012   HTN (hypertension) 09/11/2012   Asthma, chronic 09/11/2012    Primary cancer of lower outer quadrant of left female breast ([place]) 12/12/2011    REFERRING DIAG: s/p Right mastectomy  THERAPY DIAG:  Status post right mastectomy  Abnormal posture  Stiffness of right shoulder, not elsewhere classified  Bilateral shoulder pain, unspecified chronicity  PERTINENT HISTORY:  06/23/2021 Surgery Right mastectomy: Grade 3 IDC with DCIS, 3.4 cm, margins negative, 2/3 lymph nodes positive ER 0%, PR 0%, HER2 negative, Ki-67 20%    01/04/2012 Surgery    Left mastectomy: Invasive ductal carcinoma 0.5 cm with high-grade DCIS 9 cm, margins negative, 0/6 lymph nodes, ER 100%, PR 100%, Ki-67 17%, HER-2   Pt also has a history of surgery for Colon cancer in 2016   PRECAUTIONS: Other: Lymphedema risk bilaterally, Right knee pain , HBP    SUBJECTIVE:  Shoulder is doing better with raising it. No increased pain after last visit.  PAIN:  Are you having pain? No pain at rest or with activity presently.    OBJECTIVE   COGNITION:            Overall cognitive status: Within functional limits for tasks assessed    PALPATION: Soft swelling inferior to right mastectomy incision   OBSERVATIONS / OTHER ASSESSMENTS: incision healing well. Glue still present over mastectomy incision, drain incision still not fully healed. Swelling noted around incision   SENSATION:            Light touch: Appears intact, not yet normal on right               POSTURE: forward head, rounded shoulders   UPPER EXTREMITY AROM/PROM:   A/PROM RIGHT  07/18/2021   08/01/2021 08/18/2021 08/29/2021  Shoulder extension 57   57  Shoulder flexion 112 pain with lowering 120 with pop 124 discomfort with lowering         124/158 P  Shoulder abduction 76 pulls in chest 102 102 with slight scaption, difficulty lowering 90  Shoulder internal rotation       Shoulder external rotation                               (Blank rows = not tested)   A/PROM LEFT  07/18/2021  Shoulder extension 62  Shoulder  flexion 152  Shoulder abduction 152  Shoulder internal rotation    Shoulder external rotation 90                          (Blank rows = not  tested)     CERVICAL AROM: All within functional limits: left rotation limited slightly more than right, C6 disc         UPPER EXTREMITY STRENGTH: NT on right, WFL on left       LYMPHEDEMA ASSESSMENTS:    SURGERY TYPE/DATE: Right Mastectomy with SLNB, 06/23/2021 , Left Mastectomy with SLNB 01/04/2012   NUMBER OF LYMPH NODES REMOVED: Right 2+/3, left 0/6   CHEMOTHERAPY: pending   RADIATION:pending   HORMONE TREATMENT: prior hormone treatment   INFECTIONS: no   LYMPHEDEMA ASSESSMENTS:    LANDMARK RIGHT  07/18/2021 08/29/2021  10 cm proximal to olecranon process 37.4 (from front of elbow) 37.3  Olecranon process 29.0 28.3  10 cm proximal to ulnar styloid process 24.5 23.8  Just proximal to ulnar styloid process 19.5 18.8  Across hand at thumb web space 19.8 19.3  At base of 2nd digit 6.3 6.1  (Blank rows = not tested)   LANDMARK LEFT  07/18/2021   10 cm proximal to olecranon process 38.6 front of elbow 38.6  Olecranon process 30.3 29.0  10 cm proximal to ulnar styloid process 24.8 24.0  Just proximal to ulnar styloid process 19.9 19.4  Across hand at thumb web space 19.7   At base of 2nd digit 6.3 6.1  (Blank rows = not tested)           QUICK DASH SURVEY: 36.36%     TODAY'S TREATMENT  09/05/2021 MANUAL ZOX:WRUE cocoa butter to Rt UT, right pectorals, lats in supine and in left SL to right UT and periscapular area Therapeutic Exs:  AA shoulder flexion and star gazer x 5 ea  Supine alphabet 2# x1, Rhythmic stabs  with 2# at 90 degrees flexion with PT resistance in supine 2 x 30 sec, Alternating isometrics IR and ER  2 # 2 x 20 seconds. Serratus punch 2# x 15, chest press 2 # x 10,  SL ER 1# x 10,supine bilateral shoulder flexion and scaption x 10 0# Finger ladder x 5 flexion, abd x 1 then DC Reach to top of water cooler while  depressing scapula x 10 Scapular depression in sitting  2x 15 with manual cues   09/01/2021 MANUAL AVW:UJWJ cocoa butter to Rt UT, right pectorals, lats in supine and in left SL to right UT and periscapular area Therapeutic Exs:  AA shoulder flexion and star gazer x 5 ea Supine serratus punch x 15 with 1#, Rhythmic stabs  with 1# at 90 degrees flexion with PT resistance in supine 2 x 30 sec, Alternating isometrics IR and ER  1# 2 x 20 seconds. Serratus punch 1# x 15, chest press 1 # x 10, supine bilateral shoulder flexion and scaption x 10 Towel roll in thoracic spine for chest stretch x 30 sec, Finger ladder x 3 Reach to top of water cooler while depressing scapula x 10 Scapular depression in sitting x 15 with manual cues Signed pt up for sleeve measuring next week. 08/29/2021 MANUAL THERAPY XBJ:YNWG cocoa butter to Rt UT, right pectorals, lats in supine and in left SL to right UT and periscapular area PROM: Gently to right shoulder flexion, and scaption, and abduction with scapular depression throughout Remeasured arm circumference and shoulder ROM for recert. Discussed goals  Therapeutic Exs:  AA shoulder flexion and star gazer x 5 ea  08/26/2021 MANUAL THERAPY: MFR: In Supine to Rt chest wall . NFA:OZHY cocoa butter to Rt UT, right pectorals, lats in supine and in left SL to  right UT and periscapular area PROM: Gently to right shoulder flexion, and scaption, and abduction with scapular depression throughout and no popping. Therapeutic Exs:  AA shoulder flexion and star gazer x 5 Supine serratus punch x 15 with 1#, Rhythmic stabs  with 1# at 90 degrees flexion with PT resistance in supine 2 x 30 sec, Alternating isometrics IR and ER 2 x 20 seconds. Serratus punch 1# x 15, supine horizontal abduction and ER with red band x 10 ea     08/22/2021 Therapeutic Exs:  Supine serratus punch x 15 with 1#, Rhythmic stabs at 90 degrees flexion with PT resistance in supine 2 x 30 sec,  Alternating isometrics IR and ER 2 x 20 seconds. Serratus punch 1# x 15, supine horizontal abduction and ER with yellow band x 10 ea  MANUAL THERAPY: MFR: In Supine to Rt chest wall . TLX:BWIO cocoa butter to Rt UT, right pectorals, lats in supine and in left SL to right UT and periscapular area PROM: Gently to right shoulder flexion, and scaption, and abduction with scapular depression throughout and no popping.    08/18/2021 Therapeutic Exs:  Supine serratus punch x 15, Rhythmic stabs at 90 degrees flexion with PT resistance in supine 2 x 30 sec, Alternating isometrics IR and ER 2 x 20 seconds. Supine AROM flexion and scaption with emphasis on scapular depression x 10 in pain free ROM Standing single arm chest stretch on doorway, wall slides x 3 ea  MANUAL THERAPY: MFR: In Supine to Rt chest wall at end Rt shoulder passive motions STM: With cocoa butter to Rt UT, right pectorals, lats in supine and in left SL to right periscapular area PROM: Gently to right shoulder flexion, and scaption, and abduction with scapular depression throughout trying to limit compensation/   PATIENT EDUCATION:  Education details: Scapular strength with yellow theraband  Person educated: Patient Education method: Explanation, verbal cues, handout Education comprehension: verbalized understanding and returned demonstration     HOME EXERCISE PROGRAM: Supine clasped hands flexion, supine stargazer, sitting scapular retraction; Standing Rt shoulder isometrics, was encouraged to try with Lt as well; scap retract and bil UE ext with yellow theraband,[id number] single arm chest stretch and wall slides for flexion   ASSESSMENT:   CLINICAL IMPRESSION: Pt had significantly improved ability to isolate scapular depression with her right side today. She was able to increase resistance on several exs today, but was very fatigued at completion of therapy. No pops in shoulder noted today. We tried finger ladder for abd but pt  was too fatigued so will try next time.  OBJECTIVE IMPAIRMENTS decreased activity tolerance, decreased knowledge of condition, decreased ROM, decreased strength, impaired UE functional use, postural dysfunction, and pain.    ACTIVITY LIMITATIONS  activities requiring use of right arm for all reaching activities .    PERSONAL FACTORS 3+ comorbidities: cancer reoccurence on opposite side, knee OA, HB  are also affecting patient's functional outcome.      REHAB POTENTIAL: Excellent   CLINICAL DECISION MAKING: Stable/uncomplicated   EVALUATION COMPLEXITY: Low   GOALS: Goals reviewed with patient? Yes   SHORT TERM GOALS: Target date: 08/30/2021   Independent and compliant with HEP to improve shoulder ROM and strength Baseline: Goal status:  MET        LONG TERM GOALS: Target date: 08/30/2021   Pt will improve right shoulder flexion and abduction to 125 degrees for improved reaching Baseline: flex 124, abd 90(08/29/21) Goal status: ongoing   2.  Pt will  have decreased right chest swelling by atleast 50% Baseline: with drawn by MD Goal status: MET   3.  Pt will have improved Quick dash score to no greater than 18% to demonstrate improved function Baseline: 36,    29% 08/29/2021 Goal status: ongoing   4.  Pt will have decreased right UE/chest pain by 50% Baseline: 50% arm, greater for chest Goal status: MET  5. Pt will be able to hold right arm up without support on wall to apply deoderant     BASELINE: uses wall    Goal Status :ongoing    6.  Pt will be able to place a dish on 2nd shelf without UT compensation    BASELINE: uses UT compensation    Goal status ;New  7.  Pt will be fit for bilateral prophylactic sleeves for decreasing risk of lymphedema  PLAN: PT FREQUENCY: 2x/week   PT DURATION: 4 weeks   PLANNED INTERVENTIONS: Therapeutic exercises, neuromuscular activities, manual techniques, Manual lymphatic drainage, self Care,orthotic fit   PLAN FOR NEXT SESSION:    Add standing wall slide abd,supine alphabet , supine AROM flexion and scaption to HEP,soft tissue mobilization to pecs, UT, lats, PROM to right UE, ,progress strength.May want a compression sleeve.        Claris Pong, PT 09/05/2021, 10:57 AM

## 2021-09-08 ENCOUNTER — Ambulatory Visit: Payer: Medicare PPO

## 2021-09-08 DIAGNOSIS — M25611 Stiffness of right shoulder, not elsewhere classified: Secondary | ICD-10-CM | POA: Diagnosis not present

## 2021-09-08 DIAGNOSIS — R293 Abnormal posture: Secondary | ICD-10-CM | POA: Diagnosis not present

## 2021-09-08 DIAGNOSIS — Z9011 Acquired absence of right breast and nipple: Secondary | ICD-10-CM

## 2021-09-08 DIAGNOSIS — M25511 Pain in right shoulder: Secondary | ICD-10-CM | POA: Diagnosis not present

## 2021-09-08 DIAGNOSIS — M1711 Unilateral primary osteoarthritis, right knee: Secondary | ICD-10-CM | POA: Diagnosis not present

## 2021-09-08 DIAGNOSIS — M25512 Pain in left shoulder: Secondary | ICD-10-CM | POA: Diagnosis not present

## 2021-09-08 NOTE — Therapy (Addendum)
OUTPATIENT PHYSICAL THERAPY TREATMENT NOTE   Patient Name: Ann Maxwell MRN: [id number] DOB:30-Nov-1940, 81 y.o., female Today's Date: 09/08/2021  PCP: Donnajean Lopes, MD REFERRING PROVIDER: Stark Klein, MD  END OF SESSION:   PT End of Session - 09/08/21 1004     Visit Number 14 (4)   Number of Visits 19  (8) for new authorization   Date for PT Re-Evaluation 09/26/21    Authorization Type [employer]    Authorization Time Period , 07/19/21- 08/30/2021, 09/01/21-09/26/21(8 visits)   Authorization - Visit Number 4    Authorization - Number of Visits  8   PT Start Time 1005    PT Stop Time 1047    PT Time Calculation (min) 42 min    Activity Tolerance Patient tolerated treatment well    Behavior During Therapy [facility] for tasks assessed/performed              Past Medical History:  Diagnosis Date   Allergy    Anemia    iron infusion -last done 1 month ago(12-15, and 12-22 -57 [facility])- Dr. Lindi Adie   Anxiety    Asthma    Breast CA ([place]) 2013   a. L breast DCIS, s/p L mastectomy 12/2011. no further issues   Breast cancer ([place]) 2023   Right breast IDC   Cataract    bilat removed    Colon cancer ([place])    Depression    pt denies any hx of depression    Environmental allergies    History of kidney stones    Hypertension    controlled on meds    Internal hemorrhoids    remains an issue- no bright bleeding seen recent   Iron deficiency anemia    Obesity    Osteoarthritis    arthritis- knees and shoulders   Osteoporosis    drug induced and on Fosamax   Transfusion history    2 yrs ago due to anemia   Vertigo    no recent issues   Past Surgical History:  Procedure Laterality Date   BREAST BIOPSY Right 05/27/2021   U/S Bx   BREAST SURGERY     CATARACT EXTRACTION, BILATERAL  2019   CHOLECYSTECTOMY     COLON RESECTION N/A 04/28/2014   Procedure: LAPAROSCOPIC  RIGHT HEMI-COLECTOMY;  Surgeon: Stark Klein, MD;  Location: [facility];  Service: General;  Laterality: N/A;    COLONOSCOPY     Lithectomy  2010   LITHOTRIPSY     MASTECTOMY Left 2013   MASTECTOMY W/ SENTINEL NODE BIOPSY  01/04/2012   Procedure: MASTECTOMY WITH SENTINEL LYMPH NODE BIOPSY;  Surgeon: Stark Klein, MD;  Location: [facility];  Service: General;  Laterality: Left;   ORIF [place]     wtih subsequent pin removal in 1997   ORIF ULNAR / RADIAL SHAFT FRACTURE Left    POLYPECTOMY     PORTACATH PLACEMENT Left 06/23/2021   Procedure: INSERTION PORT-A-CATH;  Surgeon: Stark Klein, MD;  Location: [facility];  Service: General;  Laterality: Left;   SENTINEL NODE BIOPSY Right 06/23/2021   Procedure: SENTINEL LYMPH NODE BIOPSY;  Surgeon: Stark Klein, MD;  Location: [facility];  Service: General;  Laterality: Right;   SIMPLE MASTECTOMY WITH AXILLARY SENTINEL NODE BIOPSY Right 06/23/2021   Procedure: RIGHT MASTECTOMY;  Surgeon: Stark Klein, MD;  Location: [facility];  Service: General;  Laterality: Right;   TONSILLECTOMY AND ADENOIDECTOMY     TOTAL KNEE ARTHROPLASTY Left  08/23/2015   Procedure: TOTAL KNEE ARTHROPLASTY;  Surgeon: Gaynelle Arabian, MD;  Location: [facility];  Service: Orthopedics;  Laterality: Left;   VAGINAL HYSTERECTOMY  20 yrs. ago   with BSO   Patient Active Problem List   Diagnosis Date Noted   Port-A-Cath in place 08/01/2021   Genetic testing 07/26/2021   Family history of ovarian cancer 07/15/2021   Cancer of central portion of right breast ([place]) 06/23/2021   Malignant neoplasm of central portion of right breast in female, estrogen receptor negative ([place]) 06/01/2021   Pain in right knee 06/12/2019   Asthma 10/17/2017   OA (osteoarthritis) of knee 08/23/2015   Primary colon cancer ([place]) 03/31/2014   Iron deficiency anemia due to chronic blood loss 07/01/2013   Sinus tachycardia 09/12/2012   Mobitz type 1 second degree AV block 09/12/2012   Chest pain 09/11/2012   HTN (hypertension) 09/11/2012   Asthma, chronic 09/11/2012    Primary cancer of lower outer quadrant of left female breast ([place]) 12/12/2011    REFERRING DIAG: s/p Right mastectomy  THERAPY DIAG:  Status post right mastectomy  Abnormal posture  Stiffness of right shoulder, not elsewhere classified  Bilateral shoulder pain, unspecified chronicity  PERTINENT HISTORY:  06/23/2021 Surgery Right mastectomy: Grade 3 IDC with DCIS, 3.4 cm, margins negative, 2/3 lymph nodes positive ER 0%, PR 0%, HER2 negative, Ki-67 20%    01/04/2012 Surgery    Left mastectomy: Invasive ductal carcinoma 0.5 cm with high-grade DCIS 9 cm, margins negative, 0/6 lymph nodes, ER 100%, PR 100%, Ki-67 17%, HER-2   Pt also has a history of surgery for Colon cancer in 2016   PRECAUTIONS: Other: Lymphedema risk bilaterally, Right knee pain , HBP    SUBJECTIVE:   Just had my knee injected.  Shoulder seems to be doing pretty well.  PAIN:  Are you having pain? No pain at rest or with activity presently.    OBJECTIVE   COGNITION:            Overall cognitive status: Within functional limits for tasks assessed    PALPATION: Soft swelling inferior to right mastectomy incision   OBSERVATIONS / OTHER ASSESSMENTS: incision healing well. Glue still present over mastectomy incision, drain incision still not fully healed. Swelling noted around incision   SENSATION:            Light touch: Appears intact, not yet normal on right               POSTURE: forward head, rounded shoulders   UPPER EXTREMITY AROM/PROM:   A/PROM RIGHT  07/18/2021   08/01/2021 08/18/2021 08/29/2021  Shoulder extension 57   57  Shoulder flexion 112 pain with lowering 120 with pop 124 discomfort with lowering         124/158 P  Shoulder abduction 76 pulls in chest 102 102 with slight scaption, difficulty lowering 90  Shoulder internal rotation       Shoulder external rotation                               (Blank rows = not tested)   A/PROM LEFT  07/18/2021  Shoulder extension 62  Shoulder flexion  152  Shoulder abduction 152  Shoulder internal rotation    Shoulder external rotation 90                          (Blank rows =  not tested)     CERVICAL AROM: All within functional limits: left rotation limited slightly more than right, C6 disc         UPPER EXTREMITY STRENGTH: NT on right, WFL on left       LYMPHEDEMA ASSESSMENTS:    SURGERY TYPE/DATE: Right Mastectomy with SLNB, 06/23/2021 , Left Mastectomy with SLNB 01/04/2012   NUMBER OF LYMPH NODES REMOVED: Right 2+/3, left 0/6   CHEMOTHERAPY: pending   RADIATION:pending   HORMONE TREATMENT: prior hormone treatment   INFECTIONS: no   LYMPHEDEMA ASSESSMENTS:    LANDMARK RIGHT  07/18/2021 08/29/2021  10 cm proximal to olecranon process 37.4 (from front of elbow) 37.3  Olecranon process 29.0 28.3  10 cm proximal to ulnar styloid process 24.5 23.8  Just proximal to ulnar styloid process 19.5 18.8  Across hand at thumb web space 19.8 19.3  At base of 2nd digit 6.3 6.1  (Blank rows = not tested)   LANDMARK LEFT  07/18/2021   10 cm proximal to olecranon process 38.6 front of elbow 38.6  Olecranon process 30.3 29.0  10 cm proximal to ulnar styloid process 24.8 24.0  Just proximal to ulnar styloid process 19.9 19.4  Across hand at thumb web space 19.7   At base of 2nd digit 6.3 6.1  (Blank rows = not tested)           QUICK DASH SURVEY: 36.36%     TODAY'S TREATMENT   09/08/2021  YOM:AYOK cocoa butter to Rt UT, right pectorals, lats in supine and in left SL to right UT and periscapular area Therapeutic Exs:  AA shoulder flexion and star gazer x 5 ea Finger ladder x 5 flexion, shoulder ranger flexion and scaption x 5 Reach to 1st shelf with 1# while depressing scapula x 10, 2nd level shelf no wt x 10, Bilateral Scapular retraction and extension with red x 10, Bilateral ER with yellow x 10, bilateral biceps curls yellow x 10, triceps extension yellow x 10 B Scapular depression in sitting  2x 15 with manual  cues  09/05/2021 MANUAL HTX:HFSF cocoa butter to Rt UT, right pectorals, lats in supine and in left SL to right UT and periscapular area Therapeutic Exs:  AA shoulder flexion and star gazer x 5 ea  Supine alphabet 2# x1, Rhythmic stabs  with 2# at 90 degrees flexion with PT resistance in supine 2 x 30 sec, Alternating isometrics IR and ER  2 # 2 x 20 seconds. Serratus punch 2# x 15, chest press 2 # x 10,  SL ER 1# x 10,supine bilateral shoulder flexion and scaption x 10 0# Finger ladder x 5 flexion, abd x 1 then DC Reach to top of water cooler while depressing scapula x 10 Scapular depression in sitting  2x 15 with manual cues   09/01/2021 MANUAL SEL:TRVU cocoa butter to Rt UT, right pectorals, lats in supine and in left SL to right UT and periscapular area Therapeutic Exs:  AA shoulder flexion and star gazer x 5 ea Supine serratus punch x 15 with 1#, Rhythmic stabs  with 1# at 90 degrees flexion with PT resistance in supine 2 x 30 sec, Alternating isometrics IR and ER  1# 2 x 20 seconds. Serratus punch 1# x 15, chest press 1 # x 10, supine bilateral shoulder flexion and scaption x 10 Towel roll in thoracic spine for chest stretch x 30 sec, Finger ladder x 3 Reach to top of water cooler while depressing scapula x 10 Scapular  depression in sitting x 15 with manual cues Signed pt up for sleeve measuring next week. 08/29/2021 MANUAL THERAPY ZLD:JTTS cocoa butter to Rt UT, right pectorals, lats in supine and in left SL to right UT and periscapular area PROM: Gently to right shoulder flexion, and scaption, and abduction with scapular depression throughout Remeasured arm circumference and shoulder ROM for recert. Discussed goals  Therapeutic Exs:  AA shoulder flexion and star gazer x 5 ea  08/26/2021 MANUAL THERAPY: MFR: In Supine to Rt chest wall . VXB:LTJQ cocoa butter to Rt UT, right pectorals, lats in supine and in left SL to right UT and periscapular area PROM: Gently to right shoulder  flexion, and scaption, and abduction with scapular depression throughout and no popping. Therapeutic Exs:  AA shoulder flexion and star gazer x 5 Supine serratus punch x 15 with 1#, Rhythmic stabs  with 1# at 90 degrees flexion with PT resistance in supine 2 x 30 sec, Alternating isometrics IR and ER 2 x 20 seconds. Serratus punch 1# x 15, supine horizontal abduction and ER with red band x 10 ea     08/22/2021 Therapeutic Exs:  Supine serratus punch x 15 with 1#, Rhythmic stabs at 90 degrees flexion with PT resistance in supine 2 x 30 sec, Alternating isometrics IR and ER 2 x 20 seconds. Serratus punch 1# x 15, supine horizontal abduction and ER with yellow band x 10 ea  MANUAL THERAPY: MFR: In Supine to Rt chest wall . ZES:PQZR cocoa butter to Rt UT, right pectorals, lats in supine and in left SL to right UT and periscapular area PROM: Gently to right shoulder flexion, and scaption, and abduction with scapular depression throughout and no popping.    08/18/2021 Therapeutic Exs:  Supine serratus punch x 15, Rhythmic stabs at 90 degrees flexion with PT resistance in supine 2 x 30 sec, Alternating isometrics IR and ER 2 x 20 seconds. Supine AROM flexion and scaption with emphasis on scapular depression x 10 in pain free ROM Standing single arm chest stretch on doorway, wall slides x 3 ea  MANUAL THERAPY: MFR: In Supine to Rt chest wall at end Rt shoulder passive motions STM: With cocoa butter to Rt UT, right pectorals, lats in supine and in left SL to right periscapular area PROM: Gently to right shoulder flexion, and scaption, and abduction with scapular depression throughout trying to limit compensation/   PATIENT EDUCATION:  Education details: Scapular strength with yellow theraband  Person educated: Patient Education method: Explanation, verbal cues, handout Education comprehension: verbalized understanding and returned demonstration     HOME EXERCISE PROGRAM: Supine clasped hands  flexion, supine stargazer, sitting scapular retraction; Standing Rt shoulder isometrics, was encouraged to try with Lt as well; scap retract and bil UE ext with yellow theraband,[id number] single arm chest stretch and wall slides for flexion   ASSESSMENT:   CLINICAL IMPRESSION: Pt did exceptionally well today with right shoulder strengthening exercises. She was able to reach to a low shelf with 1# without shoulder compensaton or popping.  With reaching to a higher shelf without resistance she had occasional scapular crepitus but no GH popping. She did well with the shoulder ranger and has improved greatly with decreased UT compensation overall and improved scapular depression. .  OBJECTIVE IMPAIRMENTS decreased activity tolerance, decreased knowledge of condition, decreased ROM, decreased strength, impaired UE functional use, postural dysfunction, and pain.    ACTIVITY LIMITATIONS  activities requiring use of right arm for all reaching activities .  PERSONAL FACTORS 3+ comorbidities: cancer reoccurence on opposite side, knee OA, HB  are also affecting patient's functional outcome.      REHAB POTENTIAL: Excellent   CLINICAL DECISION MAKING: Stable/uncomplicated   EVALUATION COMPLEXITY: Low   GOALS: Goals reviewed with patient? Yes   SHORT TERM GOALS: Target date: 08/30/2021   Independent and compliant with HEP to improve shoulder ROM and strength Baseline: Goal status:  MET        LONG TERM GOALS: Target date: 08/30/2021   Pt will improve right shoulder flexion and abduction to 125 degrees for improved reaching Baseline: flex 124, abd 90(08/29/21) Goal status: ongoing   2.  Pt will have decreased right chest swelling by atleast 50% Baseline: with drawn by MD Goal status: MET   3.  Pt will have improved Quick dash score to no greater than 18% to demonstrate improved function Baseline: 36,    29% 08/29/2021 Goal status: ongoing   4.  Pt will have decreased right UE/chest pain  by 50% Baseline: 50% arm, greater for chest Goal status: MET  5. Pt will be able to hold right arm up without support on wall to apply deoderant     BASELINE: uses wall    Goal Status :ongoing    6.  Pt will be able to place a dish on 2nd shelf without UT compensation    BASELINE: uses UT compensation    Goal status ;New  7.  Pt will be fit for bilateral prophylactic sleeves for decreasing risk of lymphedema  PLAN: PT FREQUENCY: 2x/week   PT DURATION: 4 weeks   PLANNED INTERVENTIONS: Therapeutic exercises, neuromuscular activities, manual techniques, Manual lymphatic drainage, self Care,orthotic fit   PLAN FOR NEXT SESSION:   Add standing wall slide abd,supine alphabet , supine AROM flexion and scaption to HEP,soft tissue mobilization to pecs, UT, lats, PROM to right UE, ,progress strength.May want a compression sleeve.        Claris Pong, PT 09/08/2021, 10:53 AM

## 2021-09-09 ENCOUNTER — Encounter: Payer: Self-pay | Admitting: *Deleted

## 2021-09-09 MED FILL — Dexamethasone Sodium Phosphate Inj 100 MG/10ML: INTRAMUSCULAR | Qty: 1 | Status: AC

## 2021-09-13 ENCOUNTER — Inpatient Hospital Stay: Payer: Medicare PPO

## 2021-09-13 ENCOUNTER — Other Ambulatory Visit (HOSPITAL_COMMUNITY): Payer: Medicare PPO

## 2021-09-13 ENCOUNTER — Other Ambulatory Visit: Payer: Self-pay

## 2021-09-13 ENCOUNTER — Inpatient Hospital Stay: Payer: Medicare PPO | Admitting: Hematology and Oncology

## 2021-09-13 DIAGNOSIS — C50111 Malignant neoplasm of central portion of right female breast: Secondary | ICD-10-CM

## 2021-09-13 DIAGNOSIS — Z171 Estrogen receptor negative status [ER-]: Secondary | ICD-10-CM

## 2021-09-13 DIAGNOSIS — Z79811 Long term (current) use of aromatase inhibitors: Secondary | ICD-10-CM | POA: Diagnosis not present

## 2021-09-13 DIAGNOSIS — Z5111 Encounter for antineoplastic chemotherapy: Secondary | ICD-10-CM | POA: Diagnosis not present

## 2021-09-13 DIAGNOSIS — D500 Iron deficiency anemia secondary to blood loss (chronic): Secondary | ICD-10-CM

## 2021-09-13 DIAGNOSIS — C50512 Malignant neoplasm of lower-outer quadrant of left female breast: Secondary | ICD-10-CM

## 2021-09-13 DIAGNOSIS — Z95828 Presence of other vascular implants and grafts: Secondary | ICD-10-CM

## 2021-09-13 DIAGNOSIS — R918 Other nonspecific abnormal finding of lung field: Secondary | ICD-10-CM | POA: Diagnosis not present

## 2021-09-13 DIAGNOSIS — Z79899 Other long term (current) drug therapy: Secondary | ICD-10-CM | POA: Diagnosis not present

## 2021-09-13 LAB — CMP (CANCER CENTER ONLY)
ALT: 18 U/L (ref 0–44)
AST: 19 U/L (ref 15–41)
Albumin: 3.9 g/dL (ref 3.5–5.0)
Alkaline Phosphatase: 73 U/L (ref 38–126)
Anion gap: 6 (ref 5–15)
BUN: 14 mg/dL (ref 8–23)
CO2: 29 mmol/L (ref 22–32)
Calcium: 9.4 mg/dL (ref 8.9–10.3)
Chloride: 99 mmol/L (ref 98–111)
Creatinine: 0.67 mg/dL (ref 0.44–1.00)
GFR, Estimated: >60 mL/min (ref >60)
Glucose, Bld: 94 mg/dL (ref 70–99)
Potassium: 4.2 mmol/L (ref 3.5–5.1)
Sodium: 134 mmol/L — ABNORMAL LOW (ref 135–145)
Total Bilirubin: 0.4 mg/dL (ref 0.3–1.2)
Total Protein: 6.6 g/dL (ref 6.5–8.1)

## 2021-09-13 LAB — CBC WITH DIFFERENTIAL (CANCER CENTER ONLY)
Abs Immature Granulocytes: 1.81 10*3/uL — ABNORMAL HIGH (ref 0.00–0.07)
Basophils Absolute: 0 10*3/uL (ref 0.0–0.1)
Basophils Relative: 0 %
Eosinophils Absolute: 0.3 10*3/uL (ref 0.0–0.5)
Eosinophils Relative: 3 %
HCT: 43.6 % (ref 36.0–46.0)
Hemoglobin: 14.6 g/dL (ref 12.0–15.0)
Immature Granulocytes: 15 %
Lymphocytes Relative: 9 %
Lymphs Abs: 1.1 10*3/uL (ref 0.7–4.0)
MCH: 29.9 pg (ref 26.0–34.0)
MCHC: 33.5 g/dL (ref 30.0–36.0)
MCV: 89.2 fL (ref 80.0–100.0)
Monocytes Absolute: 1.3 10*3/uL — ABNORMAL HIGH (ref 0.1–1.0)
Monocytes Relative: 11 %
Neutro Abs: 7.3 10*3/uL (ref 1.7–7.7)
Neutrophils Relative %: 62 %
Platelet Count: 234 10*3/uL (ref 150–400)
RBC: 4.89 MIL/uL (ref 3.87–5.11)
RDW: 14.5 % (ref 11.5–15.5)
Smear Review: NORMAL
WBC Count: 11.8 10*3/uL — ABNORMAL HIGH (ref 4.0–10.5)
nRBC: 0 % (ref 0.0–0.2)

## 2021-09-13 MED ORDER — METHOTREXATE SODIUM (PF) CHEMO INJECTION 250 MG/10ML
40.0000 mg/m2 | Freq: Once | INTRAMUSCULAR | Status: AC
  Administered 2021-09-13: 81.25 mg via INTRAVENOUS
  Filled 2021-09-13: qty 3.25

## 2021-09-13 MED ORDER — SODIUM CHLORIDE 0.9 % IV SOLN
600.0000 mg/m2 | Freq: Once | INTRAVENOUS | Status: AC
  Administered 2021-09-13: 1220 mg via INTRAVENOUS
  Filled 2021-09-13: qty 61

## 2021-09-13 MED ORDER — SODIUM CHLORIDE 0.9 % IV SOLN
10.0000 mg | Freq: Once | INTRAVENOUS | Status: AC
  Administered 2021-09-13: 10 mg via INTRAVENOUS
  Filled 2021-09-13: qty 10

## 2021-09-13 MED ORDER — SODIUM CHLORIDE 0.9 % IV SOLN: Freq: Once | INTRAVENOUS | Status: AC

## 2021-09-13 MED ORDER — SODIUM CHLORIDE 0.9% FLUSH
10.0000 mL | Freq: Once | INTRAVENOUS | Status: AC
  Administered 2021-09-13: 10 mL

## 2021-09-13 MED ORDER — HEPARIN SOD (PORK) LOCK FLUSH 100 UNIT/ML IV SOLN
500.0000 [IU] | Freq: Once | INTRAVENOUS | Status: AC | PRN
  Administered 2021-09-13: 500 [IU]

## 2021-09-13 MED ORDER — FLUOROURACIL CHEMO INJECTION 2.5 GM/50ML
600.0000 mg/m2 | Freq: Once | INTRAVENOUS | Status: AC
  Administered 2021-09-13: 1200 mg via INTRAVENOUS
  Filled 2021-09-13: qty 24

## 2021-09-13 MED ORDER — PALONOSETRON HCL INJECTION 0.25 MG/5ML
0.2500 mg | Freq: Once | INTRAVENOUS | Status: AC
  Administered 2021-09-13: 0.25 mg via INTRAVENOUS
  Filled 2021-09-13: qty 5

## 2021-09-13 MED ORDER — SODIUM CHLORIDE 0.9% FLUSH
10.0000 mL | INTRAVENOUS | Status: DC | PRN
  Administered 2021-09-13: 10 mL

## 2021-09-13 NOTE — Patient Instructions (Signed)
[facility]   Discharge Instructions: Thank you for choosing [facility] to provide your oncology and hematology care.   If you have a lab appointment with the [facility], please go directly to the [facility] and check in at the registration area.   Wear comfortable clothing and clothing appropriate for easy access to any Portacath or PICC line.   We strive to give you quality time with your provider. You may need to reschedule your appointment if you arrive late (15 or more minutes).  Arriving late affects you and other patients whose appointments are after yours.  Also, if you miss three or more appointments without notifying the office, you may be dismissed from the clinic at the provider's discretion.      For prescription refill requests, have your pharmacy contact our office and allow 72 hours for refills to be completed.    Today you received the following chemotherapy and/or immunotherapy agents: cyclophosphamide, methotrexate, and fluorouracil      To help prevent nausea and vomiting after your treatment, we encourage you to take your nausea medication as directed.  BELOW ARE SYMPTOMS THAT SHOULD BE REPORTED IMMEDIATELY: *FEVER GREATER THAN 100.4 F (38 C) OR HIGHER *CHILLS OR SWEATING *NAUSEA AND VOMITING THAT IS NOT CONTROLLED WITH YOUR NAUSEA MEDICATION *UNUSUAL SHORTNESS OF BREATH *UNUSUAL BRUISING OR BLEEDING *URINARY PROBLEMS (pain or burning when urinating, or frequent urination) *BOWEL PROBLEMS (unusual diarrhea, constipation, pain near the anus) TENDERNESS IN MOUTH AND THROAT WITH OR WITHOUT PRESENCE OF ULCERS (sore throat, sores in mouth, or a toothache) UNUSUAL RASH, SWELLING OR PAIN  UNUSUAL VAGINAL DISCHARGE OR ITCHING   Items with * indicate a potential emergency and should be followed up as soon as possible or go to the Emergency Department if any problems should occur.  Please show the CHEMOTHERAPY ALERT CARD or  IMMUNOTHERAPY ALERT CARD at check-in to the Emergency Department and triage nurse.  Should you have questions after your visit or need to cancel or reschedule your appointment, please contact [facility]  Dept: [phone]  and follow the prompts.  Office hours are 8:00 a.m. to 4:30 p.m. Monday - Friday. Please note that voicemails left after 4:00 p.m. may not be returned until the following business day.  We are closed weekends and major holidays. You have access to a nurse at all times for urgent questions. Please call the main number to the clinic Dept: [phone] and follow the prompts.   For any non-urgent questions, you may also contact your provider using MyChart. We now offer e-Visits for anyone 79 and older to request care online for non-urgent symptoms. For details visit [email or web address].   Also download the MyChart app! Go to the app store, search "MyChart", open the app, select [facility], and log in with your MyChart username and password.  Due to Covid, a mask is required upon entering the hospital/clinic. If you do not have a mask, one will be given to you upon arrival. For doctor visits, patients may have 1 support person aged 79 or older with them. For treatment visits, patients cannot have anyone with them due to current Covid guidelines and our immunocompromised population.

## 2021-09-13 NOTE — Assessment & Plan Note (Signed)
Left breast cancer invasive ductal carcinoma: 0.5 cm with 9 mm area of DCIS status post left mastectomy 01/04/2012 ER/PR positive HER-2 negative Ki-67 17% currently on Arimidex 1 mg daily Since 02/07/2012.Completed 7 years of October 2020 (cecal adenocarcinoma diagnosed 04/22/2013, preop CEA of 5.1, status post right hemicolectomy 04/28/2014, grade 2, no MVI, 0/25 lymph nodes, subserosal involvement, T3 N0 M0 stage II A, no mismatch repair)  Contralateral breast cancer: 05/19/2021: Right breast mammogram and ultrasound: 1.6 cm mass in the right breast 12 o'clock position with architectural distortion highly suspicious for malignancy, no axillary lymph nodes, biopsy: Grade 3 IDC ER 0%, PR 0%, HER2 negative 1+ by IHC, Ki-67 20%  06/23/2021:Right mastectomy: Grade 3 IDC with DCIS, 3.4 cm, margins negative, 2/3 lymph nodes positive ER 0%, PR 0%, HER2 negative, Ki-67 20%  CT CAP and bone scan: 06/08/2021: Multiple small lung nodules stable with the exception of new 4 mm left lower lobe lung nodule and a 6 mm right lower lobe lung nodule open (nonspecific)  Treatment plan: 1. Adjuvant chemotherapy with CMF x6 cycles 2. Adjuvant radiation ------------------------------------------------------------------------------------------------------- Current treatment: Cycle 3 CMF Chemotoxicities: 1.  Mild nausea for couple of days 2. Fatigue Overall she tolerated the treatment extremely well.  Return to clinic in 3 weeks for cycle 4

## 2021-09-13 NOTE — Progress Notes (Signed)
Patient Care Team: Donnajean Lopes, MD as PCP - General (Internal Medicine) Mauro Kaufmann, RN as Oncology Nurse Navigator Rockwell Germany, RN as Oncology Nurse Navigator  DIAGNOSIS:  Encounter Diagnosis  Name Primary?   Primary cancer of lower outer quadrant of left female breast ([place])     SUMMARY OF ONCOLOGIC HISTORY: Oncology History  Primary cancer of lower outer quadrant of left female breast ([place])  01/04/2012 Surgery   Left mastectomy: Invasive ductal carcinoma 0.5 cm with high-grade DCIS 9 cm, margins negative, 0/6 lymph nodes, ER 100%, PR 100%, Ki-67 17%, HER-2 negative ratio 1.16    02/07/2012 - 05/28/2016 Anti-estrogen oral therapy   Anastrozole 1 mg daily (stopped early due to Osteoporosis)   06/23/2021 Surgery   Right mastectomy: Grade 3 IDC with DCIS, 3.4 cm, margins negative, 2/3 lymph nodes positive ER 0%, PR 0%, HER2 negative, Ki-67 20%   Primary colon cancer ([place])  03/20/2014 Initial Diagnosis   Invasive adenocarcinoma of [place]    04/28/2014 Surgery   Right hemicolectomy: Invasive adenocarcinoma with mucinous features moderately differentiated invading subserosal tissue, margins negative, 25 lymph nodes negative, T3 N0 M0 stage II a, no microsatellite instability    Malignant neoplasm of central portion of right breast in female, estrogen receptor negative ([place])  06/01/2021 Initial Diagnosis   Malignant neoplasm of central portion of right breast in female, estrogen receptor negative ([place])    06/01/2021 Cancer Staging   Staging form: Breast, AJCC 8th Edition - Clinical: Stage IB (cT1c, cN0, cM0, G3, ER-, PR-, HER2-) - Signed by Nicholas Lose, MD on 06/01/2021 Histologic grading system: 3 grade system    08/02/2021 -  Chemotherapy   Patient is on Treatment Plan : BREAST Adjuvant CMF IV q21d      Cancer of central portion of right breast ([place])  06/23/2021 Initial Diagnosis   Cancer of central portion of right breast ([place])    08/02/2021 -  Chemotherapy    Patient is on Treatment Plan : BREAST Adjuvant CMF IV q21d        CHIEF COMPLIANT: Cycle 3 CMF  INTERVAL HISTORY: Ann Maxwell is a 81 y.o. with above-mentioned history of left breast cancer who is currently on surveillance after completion of adjuvant antiestrogen therapy with anastrozole. She presents to the clinic today for a follow-up and treatment. She states treatment went well/ States the first 3 days she had mils nausea. Day 8 she states that she was exhausted and fatigue. States appetite is good. Denies constipation.  ALLERGIES:  is allergic to other, pollen extract, silicone, tape, tetracycline, tyloxapol, and terramycin [oxytetracycline].  MEDICATIONS:  Current Outpatient Medications  Medication Sig Dispense Refill   ADVAIR DISKUS 250-50 MCG/DOSE AEPB Inhale 1 puff into the lungs 2 (two) times daily. 180 each 0   albuterol (VENTOLIN HFA) 108 (90 Base) MCG/ACT inhaler Inhale 2 puffs into the lungs every 6 (six) hours as needed for wheezing or shortness of breath.     alendronate (FOSAMAX) 70 MG tablet Take 1 tablet (70 mg total) by mouth once a week. Take with a full glass of water on an empty stomach.     ALPRAZolam (XANAX) 0.25 MG tablet Take 1 tablet (0.25 mg total) by mouth at bedtime as needed for anxiety. 30 tablet 0   Docusate Sodium (DSS) 100 MG CAPS docusate sodium 100 mg capsule  TAKE 1 CAPSULE BY MOUTH 2 TIMES DAILY     gabapentin (NEURONTIN) 100 MG capsule Take 1 capsule (100 mg total)  by mouth 3 (three) times daily. (Patient taking differently: Take 100 mg by mouth at bedtime.)     hydrochlorothiazide (MICROZIDE) 12.5 MG capsule Take 12.5 mg by mouth daily.     HYDROcodone-acetaminophen (NORCO/VICODIN) 5-325 MG tablet hydrocodone 5 mg-acetaminophen 325 mg tablet  TAKE 1 OR 2 TABLETS BY MOUTH EVERY 4 HOURS AS NEEDED FOR moderate PAIN     hydrocortisone (ANUSOL-HC) 2.5 % rectal cream Place 1 application. rectally 2 (two) times daily. 30 g 1   hydrocortisone 2.5 %  cream hydrocortisone 2.5 % topical cream with perineal applicator  USE 1 application rectally 2 TIMES DAILY     lidocaine-prilocaine (EMLA) cream Apply to affected area once 30 g 3   loratadine (CLARITIN) 10 MG tablet Take 10 mg by mouth daily as needed for allergies.     losartan (COZAAR) 100 MG tablet losartan 100 mg tablet  TAKE 1 TABLET BY MOUTH EVERY DAY     losartan (COZAAR) 50 MG tablet Take 50 mg by mouth every evening.     melatonin 5 MG TABS Take 5 mg by mouth at bedtime as needed (sleep).     montelukast (SINGULAIR) 10 MG tablet Take 10 mg by mouth at bedtime.     ondansetron (ZOFRAN) 8 MG tablet Take 1 tablet (8 mg total) by mouth 2 (two) times daily as needed for refractory nausea / vomiting. Start on day 3 after chemotherapy. 30 tablet 1   polyvinyl alcohol (LIQUIFILM TEARS) 1.4 % ophthalmic solution Place 2 drops into both eyes as needed for dry eyes.     prochlorperazine (COMPAZINE) 10 MG tablet Take 1 tablet (10 mg total) by mouth every 6 (six) hours as needed (Nausea or vomiting). 30 tablet 1   Vitamin D, Ergocalciferol, (DRISDOL) 1.25 MG (50000 UNIT) CAPS capsule Take 50,000 Units by mouth every 7 (seven) days.     No current facility-administered medications for this visit.    PHYSICAL EXAMINATION: ECOG PERFORMANCE STATUS: 1 - Symptomatic but completely ambulatory  Vitals:   09/13/21 1104  BP: 128/80  Pulse: 84  Resp: 18  Temp: (!) 97.3 F (36.3 C)  SpO2: 98%   Filed Weights   09/13/21 1104  Weight: 203 lb 4.8 oz (92.2 kg)      LABORATORY DATA:  I have reviewed the data as listed    Latest Ref Rng & Units 08/23/2021    9:51 AM 08/02/2021    8:01 AM 07/14/2021   10:37 AM  CMP  Glucose 70 - 99 mg/dL 108   127   86    BUN 8 - 23 mg/dL _0 Creatinine 0.44 - 1.00 mg/dL 0.65   0.67   0.71    Sodium 135 - 145 mmol/L 137   137   138    Potassium 3.5 - 5.1 mmol/L 3.5   3.3   4.3    Chloride 98 - 111 mmol/L 100   101   102    CO2 22 - 32 mmol/L _1 Calcium 8.9 - 10.3 mg/dL 9.4   9.2   9.9    Total Protein 6.5 - 8.1 g/dL 6.9   6.9   7.3    Total Bilirubin 0.3 - 1.2 mg/dL 0.5   0.5   0.4    Alkaline Phos 38 - 126 U/L 76   69   84    AST 15 - 41 U/L  _0 ALT 0 - 44 U/L _1 Lab Results  Component Value Date   WBC 11.8 (H) 09/13/2021   HGB 14.6 09/13/2021   HCT 43.6 09/13/2021   MCV 89.2 09/13/2021   PLT 234 09/13/2021   NEUTROABS PENDING 09/13/2021    ASSESSMENT & PLAN:  Primary cancer of lower outer quadrant of left female breast ([place]) Left breast cancer invasive ductal carcinoma: 0.5 cm with 9 mm area of DCIS status post left mastectomy 01/04/2012 ER/PR positive HER-2 negative Ki-67 17% currently on Arimidex 1 mg daily  Since 02/07/2012.  Completed 7 years of October 2020  (cecal adenocarcinoma diagnosed 04/22/2013, preop CEA of 5.1, status post right hemicolectomy 04/28/2014, grade 2, no MVI, 0/25 lymph nodes, subserosal involvement, T3 N0 M0 stage II A, no mismatch repair)   Contralateral breast cancer: 05/19/2021: Right breast mammogram and ultrasound: 1.6 cm mass in the right breast 12 o'clock position with architectural distortion highly suspicious for malignancy, no axillary lymph nodes, biopsy: Grade 3 IDC ER 0%, PR 0%, HER2 negative 1+ by IHC, Ki-67 20%    06/23/2021:Right mastectomy: Grade 3 IDC with DCIS, 3.4 cm, margins negative, 2/3 lymph nodes positive ER 0%, PR 0%, HER2 negative, Ki-67 20%    CT CAP and bone scan: 06/08/2021: Multiple small lung nodules stable with the exception of new 4 mm left lower lobe lung nodule and a 6 mm right lower lobe lung nodule open (nonspecific)   Treatment plan: Adjuvant chemotherapy with CMF x6 cycles Adjuvant radiation ------------------------------------------------------------------------------------------------------- Current treatment: Cycle 3 CMF Chemotoxicities: 1.  Mild nausea for couple of days 2. Fatigue: On day 8 she felt extremely  fatigued Overall she tolerated the treatment extremely well. Labs have been reviewed. Return to clinic in 3 weeks for cycle 4   No orders of the defined types were placed in this encounter.  The patient has a good understanding of the overall plan. she agrees with it. she will call with any problems that may develop before the next visit here. Total time spent: 30 mins including face to face time and time spent for planning, charting and co-ordination of care   Harriette Ohara, MD 09/13/21    I Gardiner Coins am scribing for Dr. Lindi Adie  I have reviewed the above documentation for accuracy and completeness, and I agree with the above.

## 2021-09-14 ENCOUNTER — Telehealth: Payer: Self-pay | Admitting: Hematology and Oncology

## 2021-09-14 NOTE — Telephone Encounter (Signed)
Scheduled appointment per 5/30 los. Left message.

## 2021-09-16 ENCOUNTER — Ambulatory Visit: Payer: Medicare PPO | Attending: General Surgery

## 2021-09-16 DIAGNOSIS — M25512 Pain in left shoulder: Secondary | ICD-10-CM | POA: Diagnosis not present

## 2021-09-16 DIAGNOSIS — Z9011 Acquired absence of right breast and nipple: Secondary | ICD-10-CM | POA: Diagnosis not present

## 2021-09-16 DIAGNOSIS — M25611 Stiffness of right shoulder, not elsewhere classified: Secondary | ICD-10-CM | POA: Diagnosis not present

## 2021-09-16 DIAGNOSIS — M25511 Pain in right shoulder: Secondary | ICD-10-CM | POA: Diagnosis not present

## 2021-09-16 DIAGNOSIS — R293 Abnormal posture: Secondary | ICD-10-CM | POA: Insufficient documentation

## 2021-09-16 NOTE — Therapy (Addendum)
OUTPATIENT PHYSICAL THERAPY TREATMENT NOTE   Patient Name: Ann Maxwell MRN: [id number] DOB:[birth year], 81 y.o., female Today's Date: 09/16/2021  PCP: Donnajean Lopes, MD REFERRING PROVIDER: Donnajean Lopes, MD  END OF SESSION:   PT End of Session - 09/16/21 1004     Visit Number 15 (5)   Number of Visits 19  (8) for new authorization   Date for PT Re-Evaluation 09/26/21    Authorization Type [employer]    Authorization Time Period , 07/19/21- 08/30/2021, 09/01/21-09/26/21(8 visits)   Authorization - Visit Number 3    Authorization - Number of Visits  8   PT Start Time 1003    PT Stop Time 1054    PT Time Calculation (min) 51 min    Activity Tolerance Patient tolerated treatment well    Behavior During Therapy [facility] for tasks assessed/performed              Past Medical History:  Diagnosis Date   Allergy    Anemia    iron infusion -last done 1 month ago(12-15, and 12-22 -60 [facility])- Dr. Lindi Adie   Anxiety    Asthma    Breast CA ([place]) 2013   a. L breast DCIS, s/p L mastectomy 12/2011. no further issues   Breast cancer ([place]) 2023   Right breast IDC   Cataract    bilat removed    Colon cancer ([place])    Depression    pt denies any hx of depression    Environmental allergies    History of kidney stones    Hypertension    controlled on meds    Internal hemorrhoids    remains an issue- no bright bleeding seen recent   Iron deficiency anemia    Obesity    Osteoarthritis    arthritis- knees and shoulders   Osteoporosis    drug induced and on Fosamax   Transfusion history    2 yrs ago due to anemia   Vertigo    no recent issues   Past Surgical History:  Procedure Laterality Date   BREAST BIOPSY Right 05/27/2021   U/S Bx   BREAST SURGERY     CATARACT EXTRACTION, BILATERAL  2019   CHOLECYSTECTOMY     COLON RESECTION N/A 04/28/2014   Procedure: LAPAROSCOPIC  RIGHT HEMI-COLECTOMY;  Surgeon: Stark Klein, MD;  Location: [facility];  Service: General;  Laterality: N/A;    COLONOSCOPY     Lithectomy  2010   LITHOTRIPSY     MASTECTOMY Left 2013   MASTECTOMY W/ SENTINEL NODE BIOPSY  01/04/2012   Procedure: MASTECTOMY WITH SENTINEL LYMPH NODE BIOPSY;  Surgeon: Stark Klein, MD;  Location: [place];  Service: General;  Laterality: Left;   ORIF [place]     wtih subsequent pin removal in 1997   ORIF ULNAR / RADIAL SHAFT FRACTURE Left    POLYPECTOMY     PORTACATH PLACEMENT Left 06/23/2021   Procedure: INSERTION PORT-A-CATH;  Surgeon: Stark Klein, MD;  Location: [place];  Service: General;  Laterality: Left;   SENTINEL NODE BIOPSY Right 06/23/2021   Procedure: SENTINEL LYMPH NODE BIOPSY;  Surgeon: Stark Klein, MD;  Location: [place];  Service: General;  Laterality: Right;   SIMPLE MASTECTOMY WITH AXILLARY SENTINEL NODE BIOPSY Right 06/23/2021   Procedure: RIGHT MASTECTOMY;  Surgeon: Stark Klein, MD;  Location: [place];  Service: General;  Laterality: Right;   TONSILLECTOMY AND ADENOIDECTOMY     TOTAL KNEE ARTHROPLASTY  Left 08/23/2015   Procedure: TOTAL KNEE ARTHROPLASTY;  Surgeon: Gaynelle Arabian, MD;  Location: [facility];  Service: Orthopedics;  Laterality: Left;   VAGINAL HYSTERECTOMY  20 yrs. ago   with BSO   Patient Active Problem List   Diagnosis Date Noted   Port-A-Cath in place 08/01/2021   Genetic testing 07/26/2021   Family history of ovarian cancer 07/15/2021   Cancer of central portion of right breast ([place]) 06/23/2021   Malignant neoplasm of central portion of right breast in female, estrogen receptor negative ([place]) 06/01/2021   Pain in right knee 06/12/2019   Asthma 10/17/2017   OA (osteoarthritis) of knee 08/23/2015   Primary colon cancer ([place]) 03/31/2014   Iron deficiency anemia due to chronic blood loss 07/01/2013   Sinus tachycardia 09/12/2012   Mobitz type 1 second degree AV block 09/12/2012   Chest pain 09/11/2012   HTN (hypertension) 09/11/2012   Asthma, chronic 09/11/2012    Primary cancer of lower outer quadrant of left female breast ([place]) 12/12/2011    REFERRING DIAG: s/p Right mastectomy  THERAPY DIAG:  Status post right mastectomy  Abnormal posture  Stiffness of right shoulder, not elsewhere classified  Bilateral shoulder pain, unspecified chronicity  PERTINENT HISTORY:  06/23/2021 Surgery Right mastectomy: Grade 3 IDC with DCIS, 3.4 cm, margins negative, 2/3 lymph nodes positive ER 0%, PR 0%, HER2 negative, Ki-67 20%    01/04/2012 Surgery    Left mastectomy: Invasive ductal carcinoma 0.5 cm with high-grade DCIS 9 cm, margins negative, 0/6 lymph nodes, ER 100%, PR 100%, Ki-67 17%, HER-2   Pt also has a history of surgery for Colon cancer in 2016   PRECAUTIONS: Other: Lymphedema risk bilaterally, Right knee pain , HBP    SUBJECTIVE:  My sleeves came but I forgot to bring them. I will bring them on Monday. I was a little nauseaus this am from the infusion on Tuesday, but feel some better now.  My shoulder is doing great. I can get my deoderant on much more easily. Much less popping in my shoulder   PAIN:  Are you having pain? No pain at rest or with activity presently.    OBJECTIVE   COGNITION:            Overall cognitive status: Within functional limits for tasks assessed    PALPATION: Soft swelling inferior to right mastectomy incision   OBSERVATIONS / OTHER ASSESSMENTS: incision healing well. Glue still present over mastectomy incision, drain incision still not fully healed. Swelling noted around incision   SENSATION:            Light touch: Appears intact, not yet normal on right               POSTURE: forward head, rounded shoulders   UPPER EXTREMITY AROM/PROM:   A/PROM RIGHT  07/18/2021   08/01/2021 08/18/2021 08/29/2021  Shoulder extension 57   57  Shoulder flexion 112 pain with lowering 120 with pop 124 discomfort with lowering         124/158 P  Shoulder abduction 76 pulls in chest 102 102 with slight scaption, difficulty  lowering 90  Shoulder internal rotation       Shoulder external rotation                               (Blank rows = not tested)   A/PROM LEFT  07/18/2021  Shoulder extension 62  Shoulder flexion 152  Shoulder  abduction 152  Shoulder internal rotation    Shoulder external rotation 90                          (Blank rows = not tested)     CERVICAL AROM: All within functional limits: left rotation limited slightly more than right, C6 disc         UPPER EXTREMITY STRENGTH: NT on right, WFL on left       LYMPHEDEMA ASSESSMENTS:    SURGERY TYPE/DATE: Right Mastectomy with SLNB, 06/23/2021 , Left Mastectomy with SLNB 01/04/2012   NUMBER OF LYMPH NODES REMOVED: Right 2+/3, left 0/6   CHEMOTHERAPY: pending   RADIATION:pending   HORMONE TREATMENT: prior hormone treatment   INFECTIONS: no   LYMPHEDEMA ASSESSMENTS:    LANDMARK RIGHT  07/18/2021 08/29/2021  10 cm proximal to olecranon process 37.4 (from front of elbow) 37.3  Olecranon process 29.0 28.3  10 cm proximal to ulnar styloid process 24.5 23.8  Just proximal to ulnar styloid process 19.5 18.8  Across hand at thumb web space 19.8 19.3  At base of 2nd digit 6.3 6.1  (Blank rows = not tested)   LANDMARK LEFT  07/18/2021   10 cm proximal to olecranon process 38.6 front of elbow 38.6  Olecranon process 30.3 29.0  10 cm proximal to ulnar styloid process 24.8 24.0  Just proximal to ulnar styloid process 19.9 19.4  Across hand at thumb web space 19.7   At base of 2nd digit 6.3 6.1  (Blank rows = not tested)           QUICK DASH SURVEY: 36.36%     TODAY'S TREATMENT  09/16/2021 MANUAL KGY:JEHU cocoa butter to Rt UT, right pectorals, lats in supine and in left SL to right UT and periscapular area Therapeutic Exs:  AA shoulder flexion and star gazer x 5 ea Serratus punch and chest press 2# x 15, alphabet 2# x1, rhythmic stabs 2# x 30 seconds, Supine horizontal abduction red x 10, standing biceps curls, scapular  retraction, shoulder extension,triceps ext, bilateral ER all with red x 10 Reach to second shelf linen cabinet 1# x 10, standing shoulder elevation x 8 no resistance. Updated HEP with pics of standing theraband    09/08/2021 DJS:HFWY cocoa butter to Rt UT, right pectorals, lats in supine and in left SL to right UT and periscapular area Therapeutic Exs:  AA shoulder flexion and star gazer x 5 ea Finger ladder x 5 flexion, shoulder ranger flexion and scaption x 5 Reach to 1st shelf with 1# while depressing scapula x 10, 2nd level shelf no wt x 10, Bilateral Scapular retraction and extension with red x 10, Bilateral ER with yellow x 10, bilateral biceps curls yellow x 10, triceps extension yellow x 10 B Scapular depression in sitting  2x 15 with manual cues  09/05/2021 MANUAL OVZ:CHYI cocoa butter to Rt UT, right pectorals, lats in supine and in left SL to right UT and periscapular area Therapeutic Exs:  AA shoulder flexion and star gazer x 5 ea  Supine alphabet 2# x1, Rhythmic stabs  with 2# at 90 degrees flexion with PT resistance in supine 2 x 30 sec, Alternating isometrics IR and ER  2 # 2 x 20 seconds. Serratus punch 2# x 15, chest press 2 # x 10,  SL ER 1# x 10,supine bilateral shoulder flexion and scaption x 10 0# Finger ladder x 5 flexion, abd x 1 then DC Reach to  top of water cooler while depressing scapula x 10 Scapular depression in sitting  2x 15 with manual cues   09/01/2021 MANUAL HTD:SKAJ cocoa butter to Rt UT, right pectorals, lats in supine and in left SL to right UT and periscapular area Therapeutic Exs:  AA shoulder flexion and star gazer x 5 ea Supine serratus punch x 15 with 1#, Rhythmic stabs  with 1# at 90 degrees flexion with PT resistance in supine 2 x 30 sec, Alternating isometrics IR and ER  1# 2 x 20 seconds. Serratus punch 1# x 15, chest press 1 # x 10, supine bilateral shoulder flexion and scaption x 10 Towel roll in thoracic spine for chest stretch x 30  sec, Finger ladder x 3 Reach to top of water cooler while depressing scapula x 10 Scapular depression in sitting x 15 with manual cues Signed pt up for sleeve measuring next week. 08/29/2021 MANUAL THERAPY GOT:LXBW cocoa butter to Rt UT, right pectorals, lats in supine and in left SL to right UT and periscapular area PROM: Gently to right shoulder flexion, and scaption, and abduction with scapular depression throughout Remeasured arm circumference and shoulder ROM for recert. Discussed goals  Therapeutic Exs:  AA shoulder flexion and star gazer x 5 ea  08/26/2021 MANUAL THERAPY: MFR: In Supine to Rt chest wall . IOM:BTDH cocoa butter to Rt UT, right pectorals, lats in supine and in left SL to right UT and periscapular area PROM: Gently to right shoulder flexion, and scaption, and abduction with scapular depression throughout and no popping. Therapeutic Exs:  AA shoulder flexion and star gazer x 5 Supine serratus punch x 15 with 1#, Rhythmic stabs  with 1# at 90 degrees flexion with PT resistance in supine 2 x 30 sec, Alternating isometrics IR and ER 2 x 20 seconds. Serratus punch 1# x 15, supine horizontal abduction and ER with red band x 10 ea      PATIENT EDUCATION:  Education details: Access Code: [email or web address] URL: [email or web address] Date: 09/16/2021 Prepared by: Cheral Almas  Exercises - Standing Bicep Curls with Resistance  - 1 x daily - 2-3 x weekly - 1 sets - 10 reps - Standing Elbow Extension with Self-Anchored Resistance  - 1 x daily - 2-3 x weekly - 1 sets - 10 reps - Shoulder External Rotation and Scapular Retraction with Resistance  - 1 x daily - 2-3 x weekly - 1 sets - 10 reps - Scapular Retraction with Resistance  - 1 x daily - 2-3 x weekly - 1 sets - 10 reps - Scapular Retraction with Resistance Advanced  - 1 x daily - 2-3 x weekly - 1 sets - 10 reps - Standing Shoulder Scaption  - 1 x daily - 2-3 x weekly - 1 sets - 10 reps Person educated:  Patient Education method: Explanation, verbal cues, handout Education comprehension: verbalized /demonstrated understanding and returned demonstration     HOME EXERCISE PROGRAM: Supine clasped hands flexion, supine stargazer, sitting scapular retraction; Standing Rt shoulder isometrics, was encouraged to try with Lt as well; scap retract and bil UE ext with red theraband,[id number] single arm chest stretch and wall slides for flexion, biceps curls, triceps ext red band, ER with red x 10   ASSESSMENT:   CLINICAL IMPRESSION: Pt continues to make good progress with right shoulder ROM and strength. She is now able to hold her arm up without propping to apply deoderant under her arm. She was able to demonstrate placing a 1# object on a shelf  without UT compensation.  There is much less shoulder popping noted. She forgot her garments today so she will bring with her next visit.  OBJECTIVE IMPAIRMENTS decreased activity tolerance, decreased knowledge of condition, decreased ROM, decreased strength, impaired UE functional use, postural dysfunction, and pain.    ACTIVITY LIMITATIONS  activities requiring use of right arm for all reaching activities .    PERSONAL FACTORS 3+ comorbidities: cancer reoccurence on opposite side, knee OA, HB  are also affecting patient's functional outcome.      REHAB POTENTIAL: Excellent   CLINICAL DECISION MAKING: Stable/uncomplicated   EVALUATION COMPLEXITY: Low   GOALS: Goals reviewed with patient? Yes   SHORT TERM GOALS: Target date: 08/30/2021   Independent and compliant with HEP to improve shoulder ROM and strength Baseline: Goal status:  MET        LONG TERM GOALS: Target date: 08/30/2021   Pt will improve right shoulder flexion and abduction to 125 degrees for improved reaching Baseline: flex 124, abd 90(08/29/21) Goal status: ongoing   2.  Pt will have decreased right chest swelling by atleast 50% Baseline: with drawn by MD Goal status: MET    3.  Pt will have improved Quick dash score to no greater than 18% to demonstrate improved function Baseline: 36,    29% 08/29/2021 Goal status: ongoing   4.  Pt will have decreased right UE/chest pain by 50% Baseline: 50% arm, greater for chest Goal status: MET  5. Pt will be able to hold right arm up without support on wall to apply deoderant     BASELINE: uses wall    Goal Status MET    6.  Pt will be able to place a dish on 2nd shelf without UT compensation    BASELINE: uses UT compensation    Goal status ;New  7.  Pt will be fit for bilateral prophylactic sleeves for decreasing risk of lymphedema  PLAN: PT FREQUENCY: 2x/week   PT DURATION: 4 weeks   PLANNED INTERVENTIONS: Therapeutic exercises, neuromuscular activities, manual techniques, Manual lymphatic drainage, self Care,orthotic fit   PLAN FOR NEXT SESSION: Decrease to 1x/week secondary to doing well   ,soft tissue mobilization to pecs, UT, lats, PROM to right UE, ,progress strength.May want a compression sleeve.(Ordered)        Claris Pong, PT 09/16/2021, 12:20 PM

## 2021-09-19 ENCOUNTER — Ambulatory Visit: Payer: Medicare PPO

## 2021-09-20 NOTE — Progress Notes (Signed)
Patient Care Team: Donnajean Lopes, MD as PCP - General (Internal Medicine) Mauro Kaufmann, RN as Oncology Nurse Navigator Rockwell Germany, RN as Oncology Nurse Navigator  DIAGNOSIS:  Encounter Diagnosis  Name Primary?   Primary cancer of lower outer quadrant of left female breast ([place])     SUMMARY OF ONCOLOGIC HISTORY: Oncology History  Primary cancer of lower outer quadrant of left female breast ([place])  01/04/2012 Surgery   Left mastectomy: Invasive ductal carcinoma 0.5 cm with high-grade DCIS 9 cm, margins negative, 0/6 lymph nodes, ER 100%, PR 100%, Ki-67 17%, HER-2 negative ratio 1.16   02/07/2012 - 05/28/2016 Anti-estrogen oral therapy   Anastrozole 1 mg daily (stopped early due to Osteoporosis)   06/23/2021 Surgery   Right mastectomy: Grade 3 IDC with DCIS, 3.4 cm, margins negative, 2/3 lymph nodes positive ER 0%, PR 0%, HER2 negative, Ki-67 20%   Primary colon cancer ([place])  03/20/2014 Initial Diagnosis   Invasive adenocarcinoma of [place]   04/28/2014 Surgery   Right hemicolectomy: Invasive adenocarcinoma with mucinous features moderately differentiated invading subserosal tissue, margins negative, 25 lymph nodes negative, T3 N0 M0 stage II a, no microsatellite instability   Malignant neoplasm of central portion of right breast in female, estrogen receptor negative ([place])  06/01/2021 Initial Diagnosis   Malignant neoplasm of central portion of right breast in female, estrogen receptor negative ([place])   06/01/2021 Cancer Staging   Staging form: Breast, AJCC 8th Edition - Clinical: Stage IB (cT1c, cN0, cM0, G3, ER-, PR-, HER2-) - Signed by Nicholas Lose, MD on 06/01/2021 Histologic grading system: 3 grade system   08/02/2021 -  Chemotherapy   Patient is on Treatment Plan : BREAST Adjuvant CMF IV q21d     Cancer of central portion of right breast ([place])  06/23/2021 Initial Diagnosis   Cancer of central portion of right breast ([place])   08/02/2021 -  Chemotherapy   Patient is  on Treatment Plan : BREAST Adjuvant CMF IV q21d       CHIEF COMPLIANT:  Cycle 4 CMF  INTERVAL HISTORY: Ann Maxwell is a  81 y.o. with above-mentioned history of left breast cancer who is currently on surveillance after completion of adjuvant antiestrogen therapy with anastrozole. She presents to the clinic today for a follow-up and treatment.  She is complaining of worsening fatigue with the last cycle of chemo and the whole 3 weeks she has been tired.  She is also noticed dryness of the hands and skin peeling on the hands.   ALLERGIES:  is allergic to other, pollen extract, silicone, tape, tetracycline, tyloxapol, and terramycin [oxytetracycline].  MEDICATIONS:  Current Outpatient Medications  Medication Sig Dispense Refill   ADVAIR DISKUS 250-50 MCG/DOSE AEPB Inhale 1 puff into the lungs 2 (two) times daily. 180 each 0   albuterol (VENTOLIN HFA) 108 (90 Base) MCG/ACT inhaler Inhale 2 puffs into the lungs every 6 (six) hours as needed for wheezing or shortness of breath.     alendronate (FOSAMAX) 70 MG tablet Take 1 tablet (70 mg total) by mouth once a week. Take with a full glass of water on an empty stomach.     ALPRAZolam (XANAX) 0.25 MG tablet Take 1 tablet (0.25 mg total) by mouth at bedtime as needed for anxiety. 30 tablet 0   Docusate Sodium (DSS) 100 MG CAPS docusate sodium 100 mg capsule  TAKE 1 CAPSULE BY MOUTH 2 TIMES DAILY     gabapentin (NEURONTIN) 100 MG capsule Take 1 capsule (100  mg total) by mouth 3 (three) times daily. (Patient taking differently: Take 100 mg by mouth at bedtime.)     hydrochlorothiazide (MICROZIDE) 12.5 MG capsule Take 12.5 mg by mouth daily.     HYDROcodone-acetaminophen (NORCO/VICODIN) 5-325 MG tablet hydrocodone 5 mg-acetaminophen 325 mg tablet  TAKE 1 OR 2 TABLETS BY MOUTH EVERY 4 HOURS AS NEEDED FOR moderate PAIN     hydrocortisone (ANUSOL-HC) 2.5 % rectal cream Place 1 application. rectally 2 (two) times daily. 30 g 1   hydrocortisone 2.5 %  cream hydrocortisone 2.5 % topical cream with perineal applicator  USE 1 application rectally 2 TIMES DAILY     lidocaine-prilocaine (EMLA) cream Apply to affected area once 30 g 3   loratadine (CLARITIN) 10 MG tablet Take 10 mg by mouth daily as needed for allergies.     losartan (COZAAR) 100 MG tablet losartan 100 mg tablet  TAKE 1 TABLET BY MOUTH EVERY DAY     losartan (COZAAR) 50 MG tablet Take 50 mg by mouth every evening.     melatonin 5 MG TABS Take 5 mg by mouth at bedtime as needed (sleep).     montelukast (SINGULAIR) 10 MG tablet Take 10 mg by mouth at bedtime.     ondansetron (ZOFRAN) 8 MG tablet Take 1 tablet (8 mg total) by mouth 2 (two) times daily as needed for refractory nausea / vomiting. Start on day 3 after chemotherapy. 30 tablet 1   polyvinyl alcohol (LIQUIFILM TEARS) 1.4 % ophthalmic solution Place 2 drops into both eyes as needed for dry eyes.     prochlorperazine (COMPAZINE) 10 MG tablet Take 1 tablet (10 mg total) by mouth every 6 (six) hours as needed (Nausea or vomiting). 30 tablet 1   Vitamin D, Ergocalciferol, (DRISDOL) 1.25 MG (50000 UNIT) CAPS capsule Take 50,000 Units by mouth every 7 (seven) days.     No current facility-administered medications for this visit.    PHYSICAL EXAMINATION: ECOG PERFORMANCE STATUS: 1 - Symptomatic but completely ambulatory  Vitals:   10/04/21 1128  Pulse: (!) 52  Resp: 18  Temp: (!) 97.3 F (36.3 C)  SpO2: 94%   Filed Weights   10/04/21 1128  Weight: 205 lb 1.6 oz (93 kg)      LABORATORY DATA:  I have reviewed the data as listed    Latest Ref Rng & Units 09/13/2021   10:55 AM 08/23/2021    9:51 AM 08/02/2021    8:01 AM  CMP  Glucose 70 - 99 mg/dL 94  108  127   BUN 8 - 23 mg/dL 14  15  15    Creatinine 0.44 - 1.00 mg/dL 0.67  0.65  0.67   Sodium 135 - 145 mmol/L 134  137  137   Potassium 3.5 - 5.1 mmol/L 4.2  3.5  3.3   Chloride 98 - 111 mmol/L 99  100  101   CO2 22 - 32 mmol/L 29  26  28    Calcium 8.9 - 10.3  mg/dL 9.4  9.4  9.2   Total Protein 6.5 - 8.1 g/dL 6.6  6.9  6.9   Total Bilirubin 0.3 - 1.2 mg/dL 0.4  0.5  0.5   Alkaline Phos 38 - 126 U/L 73  76  69   AST 15 - 41 U/L 19  21  15    ALT 0 - 44 U/L 18  22  17      Lab Results  Component Value Date   WBC 8.3 10/04/2021  HGB 15.0 10/04/2021   HCT 45.1 10/04/2021   MCV 90.6 10/04/2021   PLT 270 10/04/2021   NEUTROABS PENDING 10/04/2021    ASSESSMENT & PLAN:  Primary cancer of lower outer quadrant of left female breast ([place]) Left breast cancer invasive ductal carcinoma: 0.5 cm with 9 mm area of DCIS status post left mastectomy 01/04/2012 ER/PR positive HER-2 negative Ki-67 17% currently on Arimidex 1 mg daily  Since 02/07/2012.  Completed 7 years of October 2020  (cecal adenocarcinoma diagnosed 04/22/2013, preop CEA of 5.1, status post right hemicolectomy 04/28/2014, grade 2, no MVI, 0/25 lymph nodes, subserosal involvement, T3 N0 M0 stage II A, no mismatch repair)   Contralateral breast cancer: 05/19/2021: Right breast mammogram and ultrasound: 1.6 cm mass in the right breast 12 o'clock position with architectural distortion highly suspicious for malignancy, no axillary lymph nodes, biopsy: Grade 3 IDC ER 0%, PR 0%, HER2 negative 1+ by IHC, Ki-67 20%    06/23/2021:Right mastectomy: Grade 3 IDC with DCIS, 3.4 cm, margins negative, 2/3 lymph nodes positive ER 0%, PR 0%, HER2 negative, Ki-67 20%    CT CAP and bone scan: 06/08/2021: Multiple small lung nodules stable with the exception of new 4 mm left lower lobe lung nodule and a 6 mm right lower lobe lung nodule open (nonspecific)   Treatment plan: Adjuvant chemotherapy with CMF x6 cycles Adjuvant radiation ------------------------------------------------------------------------------------------------------- Current treatment: Cycle 4 CMF Chemotoxicities: 1.  Mild nausea for couple of days 2. Fatigue: Worsening fatigue.  I will reduce the dose of her chemo with cycle 4. 3.  Skin dryness  of the hands: Encouraged her to use moisturizing lotion.  Return to clinic in 3 weeks for cycle 5    No orders of the defined types were placed in this encounter.  The patient has a good understanding of the overall plan. she agrees with it. she will call with any problems that may develop before the next visit here. Total time spent: 30 mins including face to face time and time spent for planning, charting and co-ordination of care   Harriette Ohara, MD 10/04/21    I Gardiner Coins am scribing for Dr. Lindi Adie  I have reviewed the above documentation for accuracy and completeness, and I agree with the above.

## 2021-09-22 ENCOUNTER — Ambulatory Visit: Payer: Medicare PPO

## 2021-09-22 DIAGNOSIS — Z9011 Acquired absence of right breast and nipple: Secondary | ICD-10-CM

## 2021-09-22 DIAGNOSIS — M25511 Pain in right shoulder: Secondary | ICD-10-CM | POA: Diagnosis not present

## 2021-09-22 DIAGNOSIS — M25611 Stiffness of right shoulder, not elsewhere classified: Secondary | ICD-10-CM

## 2021-09-22 DIAGNOSIS — M25512 Pain in left shoulder: Secondary | ICD-10-CM | POA: Diagnosis not present

## 2021-09-22 DIAGNOSIS — R293 Abnormal posture: Secondary | ICD-10-CM

## 2021-09-22 NOTE — Therapy (Signed)
OUTPATIENT PHYSICAL THERAPY TREATMENT NOTE   Patient Name: Ann Maxwell MRN: [id number] DOB:[birth year], 81 y.o., female Today's Date: 09/22/2021  PCP: Donnajean Lopes, MD REFERRING PROVIDER: Stark Klein, MD  END OF SESSION:    PT End of Session - 09/22/21 1103     Visit Number 16    Number of Visits 19    Date for PT Re-Evaluation 09/26/21    Authorization Type [employer]    Authorization Time Period 07/19/2021-08/30/2021 12 visits,, 09/01/21- 09/26/2021 8 visits    Authorization - Visit Number 16    Authorization - Number of Visits 19    Progress Note Due on Visit 43    PT Start Time 1105    PT Stop Time 1157    PT Time Calculation (min) 52 min    Activity Tolerance Patient tolerated treatment well    Behavior During Therapy [facility] for tasks assessed/performed               Past Medical History:  Diagnosis Date   Allergy    Anemia    iron infusion -last done 1 month ago(12-15, and 12-22 -66 [facility])- Dr. Lindi Adie   Anxiety    Asthma    Breast CA ([place]) 2013   a. L breast DCIS, s/p L mastectomy 12/2011. no further issues   Breast cancer ([place]) 2023   Right breast IDC   Cataract    bilat removed    Colon cancer ([place])    Depression    pt denies any hx of depression    Environmental allergies    History of kidney stones    Hypertension    controlled on meds    Internal hemorrhoids    remains an issue- no bright bleeding seen recent   Iron deficiency anemia    Obesity    Osteoarthritis    arthritis- knees and shoulders   Osteoporosis    drug induced and on Fosamax   Transfusion history    2 yrs ago due to anemia   Vertigo    no recent issues   Past Surgical History:  Procedure Laterality Date   BREAST BIOPSY Right 05/27/2021   U/S Bx   BREAST SURGERY     CATARACT EXTRACTION, BILATERAL  2019   CHOLECYSTECTOMY     COLON RESECTION N/A 04/28/2014   Procedure: LAPAROSCOPIC  RIGHT HEMI-COLECTOMY;  Surgeon: Stark Klein, MD;  Location: [facility];  Service: General;   Laterality: N/A;   COLONOSCOPY     Lithectomy  2010   LITHOTRIPSY     MASTECTOMY Left 2013   MASTECTOMY W/ SENTINEL NODE BIOPSY  01/04/2012   Procedure: MASTECTOMY WITH SENTINEL LYMPH NODE BIOPSY;  Surgeon: Stark Klein, MD;  Location: [place];  Service: General;  Laterality: Left;   ORIF [place]     wtih subsequent pin removal in 1997   ORIF ULNAR / RADIAL SHAFT FRACTURE Left    POLYPECTOMY     PORTACATH PLACEMENT Left 06/23/2021   Procedure: INSERTION PORT-A-CATH;  Surgeon: Stark Klein, MD;  Location: [place];  Service: General;  Laterality: Left;   SENTINEL NODE BIOPSY Right 06/23/2021   Procedure: SENTINEL LYMPH NODE BIOPSY;  Surgeon: Stark Klein, MD;  Location: [place];  Service: General;  Laterality: Right;   SIMPLE MASTECTOMY WITH AXILLARY SENTINEL NODE BIOPSY Right 06/23/2021   Procedure: RIGHT MASTECTOMY;  Surgeon: Stark Klein, MD;  Location: [place];  Service: General;  Laterality: Right;   TONSILLECTOMY  AND ADENOIDECTOMY     TOTAL KNEE ARTHROPLASTY Left 08/23/2015   Procedure: TOTAL KNEE ARTHROPLASTY;  Surgeon: Gaynelle Arabian, MD;  Location: [facility];  Service: Orthopedics;  Laterality: Left;   VAGINAL HYSTERECTOMY  20 yrs. ago   with BSO   Patient Active Problem List   Diagnosis Date Noted   Port-A-Cath in place 08/01/2021   Genetic testing 07/26/2021   Family history of ovarian cancer 07/15/2021   Cancer of central portion of right breast ([place]) 06/23/2021   Malignant neoplasm of central portion of right breast in female, estrogen receptor negative ([place]) 06/01/2021   Pain in right knee 06/12/2019   Asthma 10/17/2017   OA (osteoarthritis) of knee 08/23/2015   Primary colon cancer ([place]) 03/31/2014   Iron deficiency anemia due to chronic blood loss 07/01/2013   Sinus tachycardia 09/12/2012   Mobitz type 1 second degree AV block 09/12/2012   Chest pain 09/11/2012   HTN (hypertension) 09/11/2012   Asthma,  chronic 09/11/2012   Primary cancer of lower outer quadrant of left female breast ([place]) 12/12/2011    REFERRING DIAG: s/p Right mastectomy  THERAPY DIAG:  Status post right mastectomy  Abnormal posture  Stiffness of right shoulder, not elsewhere classified  Bilateral shoulder pain, unspecified chronicity  PERTINENT HISTORY:  06/23/2021 Surgery Right mastectomy: Grade 3 IDC with DCIS, 3.4 cm, margins negative, 2/3 lymph nodes positive ER 0%, PR 0%, HER2 negative, Ki-67 20%    01/04/2012 Surgery    Left mastectomy: Invasive ductal carcinoma 0.5 cm with high-grade DCIS 9 cm, margins negative, 0/6 lymph nodes, ER 100%, PR 100%, Ki-67 17%, HER-2   Pt also has a history of surgery for Colon cancer in 2016   PRECAUTIONS: Other: Lymphedema risk bilaterally, Right knee pain , HBP    SUBJECTIVE:  Having a harder time breathing today due to the [place].  I remembered the sleeves today. Shoulder is doing well. The fatigue has really bothered me with this round of chemo PAIN:  Are you having pain? No pain at rest or with activity presently.    OBJECTIVE   COGNITION:            Overall cognitive status: Within functional limits for tasks assessed    PALPATION: Soft swelling inferior to right mastectomy incision   OBSERVATIONS / OTHER ASSESSMENTS: incision healing well. Glue still present over mastectomy incision, drain incision still not fully healed. Swelling noted around incision   SENSATION:            Light touch: Appears intact, not yet normal on right               POSTURE: forward head, rounded shoulders   UPPER EXTREMITY AROM/PROM:   A/PROM RIGHT  07/18/2021   08/01/2021 08/18/2021 08/29/2021  Shoulder extension 57   57  Shoulder flexion 112 pain with lowering 120 with pop 124 discomfort with lowering         124/158 P  Shoulder abduction 76 pulls in chest 102 102 with slight scaption, difficulty lowering 90  Shoulder internal rotation       Shoulder external rotation                                (Blank rows = not tested)   A/PROM LEFT  07/18/2021  Shoulder extension 62  Shoulder flexion 152  Shoulder abduction 152  Shoulder internal rotation    Shoulder external rotation 90                          (  Blank rows = not tested)     CERVICAL AROM: All within functional limits: left rotation limited slightly more than right, C6 disc         UPPER EXTREMITY STRENGTH: NT on right, WFL on left       LYMPHEDEMA ASSESSMENTS:    SURGERY TYPE/DATE: Right Mastectomy with SLNB, 06/23/2021 , Left Mastectomy with SLNB 01/04/2012   NUMBER OF LYMPH NODES REMOVED: Right 2+/3, left 0/6   CHEMOTHERAPY: pending   RADIATION:pending   HORMONE TREATMENT: prior hormone treatment   INFECTIONS: no   LYMPHEDEMA ASSESSMENTS:    LANDMARK RIGHT  07/18/2021 08/29/2021  10 cm proximal to olecranon process 37.4 (from front of elbow) 37.3  Olecranon process 29.0 28.3  10 cm proximal to ulnar styloid process 24.5 23.8  Just proximal to ulnar styloid process 19.5 18.8  Across hand at thumb web space 19.8 19.3  At base of 2nd digit 6.3 6.1  (Blank rows = not tested)   LANDMARK LEFT  07/18/2021   10 cm proximal to olecranon process 38.6 front of elbow 38.6  Olecranon process 30.3 29.0  10 cm proximal to ulnar styloid process 24.8 24.0  Just proximal to ulnar styloid process 19.9 19.4  Across hand at thumb web space 19.7   At base of 2nd digit 6.3 6.1  (Blank rows = not tested)           QUICK DASH SURVEY: 36.36%     TODAY'S TREATMENT   09/22/2021 Pt bought her bilateral arm sleeves and gauntlet. She donned the first sleeve by herself with demonstration by PT how to turn inside out and pull on. She then donned the gauntlet and needed a little assist to get it in the proper place. Since it was rather difficult I brought out our easy slide which we used to don her other sleeve.  This helped tremendously and after resting pt felt both sleeves and glove were feeling fine.  She liked the fact that the gauntlet supported her thumb arthritis on the right, and it did not bother her wrist on the left where she had a prior ORIF. We discussed when she would wear sleeves, particularly if taking a long trip like to [place] which she is considering.  She is going to problem solve with some things she has at home to assist with donning, and it was also suggested that she try [facility] to see if they have something similar to the [place] slide that is not as expensive. She was very fatigued from chemo and from the poor air from [employer].  We will resume exercises next week    09/16/2021 MANUAL TDH:RCBU cocoa butter to Rt UT, right pectorals, lats in supine and in left SL to right UT and periscapular area Therapeutic Exs:  AA shoulder flexion and star gazer x 5 ea Serratus punch and chest press 2# x 15, alphabet 2# x1, rhythmic stabs 2# x 30 seconds, Supine horizontal abduction red x 10, standing biceps curls, scapular retraction, shoulder extension,triceps ext, bilateral ER all with red x 10 Reach to second shelf linen cabinet 1# x 10, standing shoulder elevation x 8 no resistance. Updated HEP with pics of standing theraband    09/08/2021 LAG:TXMI cocoa butter to Rt UT, right pectorals, lats in supine and in left SL to right UT and periscapular area Therapeutic Exs:  AA shoulder flexion and star gazer x 5 ea Finger ladder x 5 flexion, shoulder ranger flexion and scaption x 5 Reach to  1st shelf with 1# while depressing scapula x 10, 2nd level shelf no wt x 10, Bilateral Scapular retraction and extension with red x 10, Bilateral ER with yellow x 10, bilateral biceps curls yellow x 10, triceps extension yellow x 10 B Scapular depression in sitting  2x 15 with manual cues  09/05/2021 MANUAL LHT:DSKA cocoa butter to Rt UT, right pectorals, lats in supine and in left SL to right UT and periscapular area Therapeutic Exs:  AA shoulder flexion and star gazer x 5 ea  Supine  alphabet 2# x1, Rhythmic stabs  with 2# at 90 degrees flexion with PT resistance in supine 2 x 30 sec, Alternating isometrics IR and ER  2 # 2 x 20 seconds. Serratus punch 2# x 15, chest press 2 # x 10,  SL ER 1# x 10,supine bilateral shoulder flexion and scaption x 10 0# Finger ladder x 5 flexion, abd x 1 then DC Reach to top of water cooler while depressing scapula x 10 Scapular depression in sitting  2x 15 with manual cues   09/01/2021 MANUAL JGO:TLXB cocoa butter to Rt UT, right pectorals, lats in supine and in left SL to right UT and periscapular area Therapeutic Exs:  AA shoulder flexion and star gazer x 5 ea Supine serratus punch x 15 with 1#, Rhythmic stabs  with 1# at 90 degrees flexion with PT resistance in supine 2 x 30 sec, Alternating isometrics IR and ER  1# 2 x 20 seconds. Serratus punch 1# x 15, chest press 1 # x 10, supine bilateral shoulder flexion and scaption x 10 Towel roll in thoracic spine for chest stretch x 30 sec, Finger ladder x 3 Reach to top of water cooler while depressing scapula x 10 Scapular depression in sitting x 15 with manual cues Signed pt up for sleeve measuring next week. PATIENT EDUCATION:  Education details: Access Code: [email or web address] URL: [email or web address] Date: 09/16/2021 Prepared by: Cheral Almas  Exercises - Standing Bicep Curls with Resistance  - 1 x daily - 2-3 x weekly - 1 sets - 10 reps - Standing Elbow Extension with Self-Anchored Resistance  - 1 x daily - 2-3 x weekly - 1 sets - 10 reps - Shoulder External Rotation and Scapular Retraction with Resistance  - 1 x daily - 2-3 x weekly - 1 sets - 10 reps - Scapular Retraction with Resistance  - 1 x daily - 2-3 x weekly - 1 sets - 10 reps - Scapular Retraction with Resistance Advanced  - 1 x daily - 2-3 x weekly - 1 sets - 10 reps - Standing Shoulder Scaption  - 1 x daily - 2-3 x weekly - 1 sets - 10 reps Person educated: Patient Education method: Explanation, verbal cues,  handout Education comprehension: verbalized /demonstrated understanding and returned demonstration     HOME EXERCISE PROGRAM: Supine clasped hands flexion, supine stargazer, sitting scapular retraction; Standing Rt shoulder isometrics, was encouraged to try with Lt as well; scap retract and bil UE ext with red theraband,[id number] single arm chest stretch and wall slides for flexion, biceps curls, triceps ext red band, ER with red x 10   ASSESSMENT:   CLINICAL IMPRESSION: Pt continues to make good progress with right shoulder ROM and strength.  We spent today discussing and practicing donning and doffing garments on both sides and using EZ for assist. She had slight difficulty donning the Right sleeve and was fatigued afterwards. The EZ slide was a big help. We also discussed when to wear the  prophylactic sleeves  OBJECTIVE IMPAIRMENTS decreased activity tolerance, decreased knowledge of condition, decreased ROM, decreased strength, impaired UE functional use, postural dysfunction, and pain.    ACTIVITY LIMITATIONS  activities requiring use of right arm for all reaching activities .    PERSONAL FACTORS 3+ comorbidities: cancer reoccurence on opposite side, knee OA, HB  are also affecting patient's functional outcome.      REHAB POTENTIAL: Excellent   CLINICAL DECISION MAKING: Stable/uncomplicated   EVALUATION COMPLEXITY: Low   GOALS: Goals reviewed with patient? Yes   SHORT TERM GOALS: Target date: 08/30/2021   Independent and compliant with HEP to improve shoulder ROM and strength Baseline: Goal status:  MET        LONG TERM GOALS: Target date: 08/30/2021   Pt will improve right shoulder flexion and abduction to 125 degrees for improved reaching Baseline: flex 124, abd 90(08/29/21) Goal status: ongoing   2.  Pt will have decreased right chest swelling by atleast 50% Baseline: with drawn by MD Goal status: MET   3.  Pt will have improved Quick dash score to no greater than  18% to demonstrate improved function Baseline: 36,    29% 08/29/2021 Goal status: ongoing   4.  Pt will have decreased right UE/chest pain by 50% Baseline: 50% arm, greater for chest Goal status: MET  5. Pt will be able to hold right arm up without support on wall to apply deoderant     BASELINE: uses wall    Goal Status MET    6.  Pt will be able to place a dish on 2nd shelf without UT compensation    BASELINE: uses UT compensation    Goal status ;New  7.  Pt will be fit for bilateral prophylactic sleeves for decreasing risk of lymphedema                                   Goal Status: MET PLAN: PT FREQUENCY: 2x/week   PT DURATION: 4 weeks   PLANNED INTERVENTIONS: Therapeutic exercises, neuromuscular activities, manual techniques, Manual lymphatic drainage, self Care,orthotic fit   PLAN FOR NEXT SESSION: DC?   ,soft tissue mobilization to pecs, UT, lats, PROM to right UE, ,progress strength.May want a compression sleeve.(Ordered)        Claris Pong, PT 09/22/2021, 12:09 PM

## 2021-09-26 ENCOUNTER — Encounter (HOSPITAL_COMMUNITY): Admission: RE | Payer: Self-pay | Source: Home / Self Care

## 2021-09-26 ENCOUNTER — Ambulatory Visit: Payer: Medicare PPO

## 2021-09-26 ENCOUNTER — Ambulatory Visit (HOSPITAL_COMMUNITY): Admission: RE | Admit: 2021-09-26 | Payer: Medicare PPO | Source: Home / Self Care | Admitting: Orthopedic Surgery

## 2021-09-26 DIAGNOSIS — M1711 Unilateral primary osteoarthritis, right knee: Secondary | ICD-10-CM

## 2021-09-26 SURGERY — ARTHROPLASTY, KNEE, TOTAL
Anesthesia: Choice | Site: Knee | Laterality: Right

## 2021-09-29 ENCOUNTER — Ambulatory Visit: Payer: Medicare PPO

## 2021-09-29 DIAGNOSIS — M25611 Stiffness of right shoulder, not elsewhere classified: Secondary | ICD-10-CM

## 2021-09-29 DIAGNOSIS — M25511 Pain in right shoulder: Secondary | ICD-10-CM | POA: Diagnosis not present

## 2021-09-29 DIAGNOSIS — R293 Abnormal posture: Secondary | ICD-10-CM | POA: Diagnosis not present

## 2021-09-29 DIAGNOSIS — Z9011 Acquired absence of right breast and nipple: Secondary | ICD-10-CM | POA: Diagnosis not present

## 2021-09-29 DIAGNOSIS — M25512 Pain in left shoulder: Secondary | ICD-10-CM | POA: Diagnosis not present

## 2021-09-29 NOTE — Therapy (Addendum)
 OUTPATIENT PHYSICAL THERAPY TREATMENT NOTE   Patient Name: Ann Maxwell MRN: [id number] DOB:Sep 11, 1940, 81 y.o., female Today's Date: 09/29/2021  PCP: Yolande Toribio MATSU, MD REFERRING PROVIDER: Aron Shoulders, MD  END OF SESSION:    PT End of Session - 09/29/21 1002     Visit Number 17    Number of Visits 19    Date for PT Re-Evaluation 09/29/21    Authorization Type [employer]    Authorization - Visit Number 17    Authorization - Number of Visits 19    PT Start Time 1003    PT Stop Time 1048    PT Time Calculation (min) 45 min    Activity Tolerance Patient tolerated treatment well    Behavior During Therapy WFL for tasks assessed/performed               Past Medical History:  Diagnosis Date   Allergy    Anemia    iron infusion -last done 1 month ago(12-15, and 12-22 -15 [facility])- Dr. Odean   Anxiety    Asthma    Breast CA (HCC) 2013   a. L breast DCIS, s/p L mastectomy 12/2011. no further issues   Breast cancer (HCC) 2023   Right breast IDC   Cataract    bilat removed    Colon cancer (HCC)    Depression    pt denies any hx of depression    Environmental allergies    History of kidney stones    Hypertension    controlled on meds    Internal hemorrhoids    remains an issue- no bright bleeding seen recent   Iron deficiency anemia    Obesity    Osteoarthritis    arthritis- knees and shoulders   Osteoporosis    drug induced and on Fosamax    Transfusion history    2 yrs ago due to anemia   Vertigo    no recent issues   Past Surgical History:  Procedure Laterality Date   BREAST BIOPSY Right 05/27/2021   U/S Bx   BREAST SURGERY     CATARACT EXTRACTION, BILATERAL  2019   CHOLECYSTECTOMY     COLON RESECTION N/A 04/28/2014   Procedure: LAPAROSCOPIC  RIGHT HEMI-COLECTOMY;  Surgeon: Shoulders Aron, MD;  Location: [facility];  Service: General;  Laterality: N/A;   COLONOSCOPY     Lithectomy  2010   LITHOTRIPSY     MASTECTOMY Left 2013   MASTECTOMY W/ SENTINEL  NODE BIOPSY  01/04/2012   Procedure: MASTECTOMY WITH SENTINEL LYMPH NODE BIOPSY;  Surgeon: Shoulders Aron, MD;  Location: [facility];  Service: General;  Laterality: Left;   ORIF TIBIA & FIBULA FRACTURES     wtih subsequent pin removal in 1997   ORIF ULNAR / RADIAL SHAFT FRACTURE Left    POLYPECTOMY     PORTACATH PLACEMENT Left 06/23/2021   Procedure: INSERTION PORT-A-CATH;  Surgeon: Aron Shoulders, MD;  Location: [facility];  Service: General;  Laterality: Left;   SENTINEL NODE BIOPSY Right 06/23/2021   Procedure: SENTINEL LYMPH NODE BIOPSY;  Surgeon: Aron Shoulders, MD;  Location: [facility];  Service: General;  Laterality: Right;   SIMPLE MASTECTOMY WITH AXILLARY SENTINEL NODE BIOPSY Right 06/23/2021   Procedure: RIGHT MASTECTOMY;  Surgeon: Aron Shoulders, MD;  Location: [facility];  Service: General;  Laterality: Right;   TONSILLECTOMY AND ADENOIDECTOMY     TOTAL KNEE ARTHROPLASTY Left 08/23/2015   Procedure: TOTAL KNEE ARTHROPLASTY;  Surgeon: Dempsey Moan, MD;  Location: [facility];  Service: Orthopedics;  Laterality: Left;   VAGINAL HYSTERECTOMY  20 yrs. ago   with BSO   Patient Active Problem List   Diagnosis Date Noted   Port-A-Cath in place 08/01/2021   Genetic testing 07/26/2021   Family history of ovarian cancer 07/15/2021   Cancer of central portion of right breast (HCC) 06/23/2021   Malignant neoplasm of central portion of right breast in female, estrogen receptor negative (HCC) 06/01/2021   Pain in right knee 06/12/2019   Asthma 10/17/2017   OA (osteoarthritis) of knee 08/23/2015   Primary colon cancer (HCC) 03/31/2014   Iron deficiency anemia due to chronic blood loss 07/01/2013   Sinus tachycardia 09/12/2012   Mobitz type 1 second degree AV block 09/12/2012   Chest pain 09/11/2012   HTN (hypertension) 09/11/2012   Asthma, chronic 09/11/2012   Primary cancer of lower outer quadrant of left female breast (HCC) 12/12/2011    REFERRING DIAG:  s/p Right mastectomy  THERAPY DIAG:  Status post right mastectomy  Abnormal posture  Stiffness of right shoulder, not elsewhere classified  Bilateral shoulder pain, unspecified chronicity  PERTINENT HISTORY:  06/23/2021 Surgery Right mastectomy: Grade 3 IDC with DCIS, 3.4 cm, margins negative, 2/3 lymph nodes positive ER 0%, PR 0%, HER2 negative, Ki-67 20%    01/04/2012 Surgery    Left mastectomy: Invasive ductal carcinoma 0.5 cm with high-grade DCIS 9 cm, margins negative, 0/6 lymph nodes, ER 100%, PR 100%, Ki-67 17%, HER-2   Pt also has a history of surgery for Colon cancer in 2016   PRECAUTIONS: Other: Lymphedema risk bilaterally, Right knee pain , HBP    SUBJECTIVE:  Everything is doing better. I can put deoderant on with no trouble now. I can put dishes into the cabinet without pain. Still a little uncomfortable reaching to extremes. Still have some trouble sleeping with my shoulder, but that's not new. Right neck and shoulder blade area feels tight today.   PAIN:  Are you having pain? No pain at rest or with activity presently.    OBJECTIVE   COGNITION:            Overall cognitive status: Within functional limits for tasks assessed    PALPATION: Soft swelling inferior to right mastectomy incision   OBSERVATIONS / OTHER ASSESSMENTS: incision healing well. Glue still present over mastectomy incision, drain incision still not fully healed. Swelling noted around incision   SENSATION:            Light touch: Appears intact, not yet normal on right               POSTURE: forward head, rounded shoulders   UPPER EXTREMITY AROM/PROM:   A/PROM RIGHT  07/18/2021   08/01/2021 08/18/2021 08/29/2021 09/29/2021  Shoulder extension 57   57 57  Shoulder flexion 112 pain with lowering 120 with pop 124 discomfort with lowering         124/158 P 127 A  Shoulder abduction 76 pulls in chest 102 102 with slight scaption, difficulty lowering 90 115  Shoulder internal rotation         Shoulder external rotation                                (Blank rows = not tested)   A/PROM LEFT  07/18/2021  Shoulder extension 62  Shoulder flexion 152  Shoulder abduction 152  Shoulder internal rotation    Shoulder  external rotation 90                          (Blank rows = not tested)     CERVICAL AROM: All within functional limits: left rotation limited slightly more than right, C6 disc         UPPER EXTREMITY STRENGTH: NT on right, WFL on left       LYMPHEDEMA ASSESSMENTS:    SURGERY TYPE/DATE: Right Mastectomy with SLNB, 06/23/2021 , Left Mastectomy with SLNB 01/04/2012   NUMBER OF LYMPH NODES REMOVED: Right 2+/3, left 0/6   CHEMOTHERAPY: pending   RADIATION:pending   HORMONE TREATMENT: prior hormone treatment   INFECTIONS: no   LYMPHEDEMA ASSESSMENTS:    [facility] RIGHT  07/18/2021 08/29/2021 09/29/2021  10 cm proximal to olecranon process 37.4 (from front of elbow) 37.3 37.0  Olecranon process 29.0 28.3 28.4  10 cm proximal to ulnar styloid process 24.5 23.8 24.3  Just proximal to ulnar styloid process 19.5 18.8 18.8  Across hand at thumb web space 19.8 19.3 19.1  At base of 2nd digit 6.3 6.1 6.1  (Blank rows = not tested)   [facility] LEFT  07/18/2021 08/29/2021 09/29/2021  10 cm proximal to olecranon process 38.6 front of elbow 38.6 38.2  Olecranon process 30.3 29.0 29.4  10 cm proximal to ulnar styloid process 24.8 24.0 24.7  Just proximal to ulnar styloid process 19.9 19.4 19.4  Across hand at thumb web space 19.7  19.2  At base of 2nd digit 6.3 6.1 6.1  (Blank rows = not tested)           QUICK DASH SURVEY: 36.36%,   6.82 today 09/29/2021     TODAY'S TREATMENT   09/29/2021 Pt feels ready to be discharged. Measured shoulder ROM and circumferences. Check all goals with pt and discussed proper way to progress repetitions/resistance with exercises. Gave a green band to progress to. DUF:Tpuy cocoa butter to Rt UT, right pectorals, lats in supine and in  left SL to right UT and periscapular area   09/22/2021 Pt bought her bilateral arm sleeves and gauntlet . She donned the first sleeve by herself with demonstration by PT how to turn inside out and pull on. She then donned the gauntlet and needed a little assist to get it in the proper place. Since it was rather difficult I brought out our easy slide which we used to don her other sleeve.  This helped tremendously and after resting pt felt both sleeves and glove were feeling fine. She liked the fact that the gauntlet supported her thumb arthritis on the right, and it did not bother her wrist on the left where she had a prior ORIF. We discussed when she would wear sleeves, particularly if taking a long trip like to [place] which she is considering.  She is going to problem solve with some things she has at home to assist with donning, and it was also suggested that she try [facility] to see if they have something similar to the EZ slide that is not as expensive. She was very fatigued from chemo and from the poor air from [employer].  We will resume exercises next week    09/16/2021 MANUAL DUF:Tpuy cocoa butter to Rt UT, right pectorals, lats in supine and in left SL to right UT and periscapular area Therapeutic Exs:  AA shoulder flexion and star gazer x 5 ea Serratus punch and chest press 2# x 15,  alphabet 2# x1, rhythmic stabs 2# x 30 seconds, Supine horizontal abduction red x 10, standing biceps curls, scapular retraction, shoulder extension,triceps ext, bilateral ER all with red x 10 Reach to second shelf linen cabinet 1# x 10, standing shoulder elevation x 8 no resistance. Updated HEP with pics of standing theraband    PATIENT EDUCATION:  Education details: Access Code: [email or web address] URL: [email or web address] Date: 09/16/2021 Prepared by: Grayce Sheldon  Exercises - Standing Bicep Curls with Resistance  - 1 x daily - 2-3 x weekly - 1 sets - 10 reps - Standing Elbow Extension  with Self-Anchored Resistance  - 1 x daily - 2-3 x weekly - 1 sets - 10 reps - Shoulder External Rotation and Scapular Retraction with Resistance  - 1 x daily - 2-3 x weekly - 1 sets - 10 reps - Scapular Retraction with Resistance  - 1 x daily - 2-3 x weekly - 1 sets - 10 reps - Scapular Retraction with Resistance Advanced  - 1 x daily - 2-3 x weekly - 1 sets - 10 reps - Standing Shoulder Scaption  - 1 x daily - 2-3 x weekly - 1 sets - 10 reps Person educated: Patient Education method: Explanation, verbal cues, handout Education comprehension: verbalized /demonstrated understanding and returned demonstration     HOME EXERCISE PROGRAM: Supine clasped hands flexion, supine stargazer, sitting scapular retraction; Standing Rt shoulder isometrics, was encouraged to try with Lt as well; scap retract and bil UE ext with red theraband,[id number] single arm chest stretch and wall slides for flexion, biceps curls, triceps ext red band, ER with red x 10   ASSESSMENT:  Pt has made excellent progress. She has achieved all goals except she partially met goal for right shoulder flexion and abd to 125 degrees.  Both are significantly improved but she only reached into abduction to 115 degrees.  She is very compliant with her HEP and has improved her right shoulder function tremendously. She has continued tightness in the UT and pectorals and scapular area but will continue to work on these with her HEP. She feels ready for discharge.       OBJECTIVE IMPAIRMENTS decreased activity tolerance, decreased knowledge of condition, decreased ROM, decreased strength, impaired UE functional use, postural dysfunction, and pain.    ACTIVITY LIMITATIONS  activities requiring use of right arm for all reaching activities .    PERSONAL FACTORS 3+ comorbidities: cancer reoccurence on opposite side, knee OA, HB  are also affecting patient's functional outcome.      REHAB POTENTIAL: Excellent   CLINICAL DECISION MAKING:  Stable/uncomplicated   EVALUATION COMPLEXITY: Low   GOALS: Goals reviewed with patient? Yes   SHORT TERM GOALS: Target date: 08/30/2021   Independent and compliant with HEP to improve shoulder ROM and strength Baseline: Goal status:  MET        LONG TERM GOALS: Target date: 08/30/2021   Pt will improve right shoulder flexion and abduction to 125 degrees for improved reaching Baseline: flex 124, abd 90(08/29/21) Goal status: Partially met for flexion, 09/29/2021   2.  Pt will have decreased right chest swelling by atleast 50% Baseline: with drawn by MD Goal status: MET   3.  Pt will have improved Quick dash score to no greater than 18% to demonstrate improved function Baseline: 36,    29% 08/29/2021 Goal status: MET 09/29/2021   4.  Pt will have decreased right UE/chest pain by 50% Baseline: 50% arm, greater for chest Goal status: MET  5. Pt  will be able to hold right arm up without support on wall to apply deoderant     BASELINE: uses wall    Goal Status MET    6.  Pt will be able to place a dish on 2nd shelf without UT compensation    BASELINE: uses UT    Goal status MET   7.  Pt will be fit for bilateral prophylactic sleeves for decreasing risk of lymphedema                                   Goal Status: MET PLAN: PT FREQUENCY: 2x/week   PT DURATION: 4 weeks   PLANNED INTERVENTIONS: Therapeutic exercises, neuromuscular activities, manual techniques, Manual lymphatic drainage, self Care,orthotic fit   PLAN FOR NEXT SESSION:  Pt is discharged to HEP.   PHYSICAL THERAPY DISCHARGE SUMMARY  Visits from Start of Care: 17  Current functional level related to goals / functional outcomes: Achieved goals   Remaining deficits: None   Education / Equipment: HEP   Patient agrees to discharge. Patient goals were met. Patient is being discharged due to meeting the stated rehab goals.    Grayce JINNY Sheldon, PT 09/29/2021, 10:51 AM

## 2021-10-03 MED FILL — Dexamethasone Sodium Phosphate Inj 100 MG/10ML: INTRAMUSCULAR | Qty: 1 | Status: AC

## 2021-10-04 ENCOUNTER — Other Ambulatory Visit: Payer: Self-pay

## 2021-10-04 ENCOUNTER — Inpatient Hospital Stay: Payer: Medicare PPO | Admitting: Hematology and Oncology

## 2021-10-04 ENCOUNTER — Inpatient Hospital Stay: Payer: Medicare PPO

## 2021-10-04 ENCOUNTER — Inpatient Hospital Stay: Payer: Medicare PPO | Attending: Hematology and Oncology

## 2021-10-04 VITALS — BP 132/78

## 2021-10-04 DIAGNOSIS — C50111 Malignant neoplasm of central portion of right female breast: Secondary | ICD-10-CM

## 2021-10-04 DIAGNOSIS — Z171 Estrogen receptor negative status [ER-]: Secondary | ICD-10-CM | POA: Insufficient documentation

## 2021-10-04 DIAGNOSIS — D500 Iron deficiency anemia secondary to blood loss (chronic): Secondary | ICD-10-CM

## 2021-10-04 DIAGNOSIS — C50512 Malignant neoplasm of lower-outer quadrant of left female breast: Secondary | ICD-10-CM | POA: Diagnosis not present

## 2021-10-04 DIAGNOSIS — Z5189 Encounter for other specified aftercare: Secondary | ICD-10-CM | POA: Diagnosis not present

## 2021-10-04 DIAGNOSIS — Z5111 Encounter for antineoplastic chemotherapy: Secondary | ICD-10-CM | POA: Insufficient documentation

## 2021-10-04 DIAGNOSIS — Z95828 Presence of other vascular implants and grafts: Secondary | ICD-10-CM

## 2021-10-04 LAB — CBC WITH DIFFERENTIAL (CANCER CENTER ONLY)
Abs Immature Granulocytes: 1.04 10*3/uL — ABNORMAL HIGH (ref 0.00–0.07)
Basophils Absolute: 0.1 10*3/uL (ref 0.0–0.1)
Basophils Relative: 1 %
Eosinophils Absolute: 0.4 10*3/uL (ref 0.0–0.5)
Eosinophils Relative: 5 %
HCT: 45.1 % (ref 36.0–46.0)
Hemoglobin: 15 g/dL (ref 12.0–15.0)
Immature Granulocytes: 13 %
Lymphocytes Relative: 11 %
Lymphs Abs: 0.9 10*3/uL (ref 0.7–4.0)
MCH: 30.1 pg (ref 26.0–34.0)
MCHC: 33.3 g/dL (ref 30.0–36.0)
MCV: 90.6 fL (ref 80.0–100.0)
Monocytes Absolute: 1.3 10*3/uL — ABNORMAL HIGH (ref 0.1–1.0)
Monocytes Relative: 16 %
Neutro Abs: 4.6 10*3/uL (ref 1.7–7.7)
Neutrophils Relative %: 54 %
Platelet Count: 270 10*3/uL (ref 150–400)
RBC: 4.98 MIL/uL (ref 3.87–5.11)
RDW: 14.7 % (ref 11.5–15.5)
Smear Review: NORMAL
WBC Count: 8.3 10*3/uL (ref 4.0–10.5)
nRBC: 0 % (ref 0.0–0.2)

## 2021-10-04 LAB — CMP (CANCER CENTER ONLY)
ALT: 17 U/L (ref 0–44)
AST: 18 U/L (ref 15–41)
Albumin: 3.9 g/dL (ref 3.5–5.0)
Alkaline Phosphatase: 85 U/L (ref 38–126)
Anion gap: 6 (ref 5–15)
BUN: 12 mg/dL (ref 8–23)
CO2: 29 mmol/L (ref 22–32)
Calcium: 9.8 mg/dL (ref 8.9–10.3)
Chloride: 104 mmol/L (ref 98–111)
Creatinine: 0.82 mg/dL (ref 0.44–1.00)
GFR, Estimated: >60 mL/min (ref >60)
Glucose, Bld: 106 mg/dL — ABNORMAL HIGH (ref 70–99)
Potassium: 3.7 mmol/L (ref 3.5–5.1)
Sodium: 139 mmol/L (ref 135–145)
Total Bilirubin: 0.3 mg/dL (ref 0.3–1.2)
Total Protein: 6.8 g/dL (ref 6.5–8.1)

## 2021-10-04 MED ORDER — SODIUM CHLORIDE 0.9 % IV SOLN: Freq: Once | INTRAVENOUS | Status: AC

## 2021-10-04 MED ORDER — SODIUM CHLORIDE 0.9% FLUSH
10.0000 mL | Freq: Once | INTRAVENOUS | Status: AC
  Administered 2021-10-04: 10 mL

## 2021-10-04 MED ORDER — METHOTREXATE SODIUM (PF) CHEMO INJECTION 250 MG/10ML
30.0000 mg/m2 | Freq: Once | INTRAMUSCULAR | Status: AC
  Administered 2021-10-04: 61 mg via INTRAVENOUS
  Filled 2021-10-04: qty 2.44

## 2021-10-04 MED ORDER — SODIUM CHLORIDE 0.9 % IV SOLN
500.0000 mg/m2 | Freq: Once | INTRAVENOUS | Status: AC
  Administered 2021-10-04: 1020 mg via INTRAVENOUS
  Filled 2021-10-04: qty 51

## 2021-10-04 MED ORDER — ALTEPLASE 2 MG IJ SOLR
2.0000 mg | Freq: Once | INTRAMUSCULAR | Status: AC
  Administered 2021-10-04: 2 mg
  Filled 2021-10-04: qty 2

## 2021-10-04 MED ORDER — SODIUM CHLORIDE 0.9 % IV SOLN
10.0000 mg | Freq: Once | INTRAVENOUS | Status: AC
  Administered 2021-10-04: 10 mg via INTRAVENOUS
  Filled 2021-10-04: qty 10

## 2021-10-04 MED ORDER — FLUOROURACIL CHEMO INJECTION 2.5 GM/50ML
400.0000 mg/m2 | Freq: Once | INTRAVENOUS | Status: AC
  Administered 2021-10-04: 800 mg via INTRAVENOUS
  Filled 2021-10-04: qty 16

## 2021-10-04 MED ORDER — PALONOSETRON HCL INJECTION 0.25 MG/5ML
0.2500 mg | Freq: Once | INTRAVENOUS | Status: AC
  Administered 2021-10-04: 0.25 mg via INTRAVENOUS
  Filled 2021-10-04: qty 5

## 2021-10-04 NOTE — Assessment & Plan Note (Addendum)
Left breast cancer invasive ductal carcinoma: 0.5 cm with 9 mm area of DCIS status post left mastectomy 01/04/2012 ER/PR positive HER-2 negative Ki-67 17% currently on Arimidex 1 mg daily Since 02/07/2012.Completed 7 years of October 2020 (cecal adenocarcinoma diagnosed 04/22/2013, preop CEA of 5.1, status post right hemicolectomy 04/28/2014, grade 2, no MVI, 0/25 lymph nodes, subserosal involvement, T3 N0 M0 stage II A, no mismatch repair)  Contralateral breast cancer: 05/19/2021: Right breast mammogram and ultrasound: 1.6 cm mass in the right breast 12 o'clock position with architectural distortion highly suspicious for malignancy, no axillary lymph nodes, biopsy: Grade 3 IDC ER 0%, PR 0%, HER2 negative 1+ by IHC, Ki-67 20%  06/23/2021:Right mastectomy: Grade 3 IDC with DCIS, 3.4 cm, margins negative, 2/3 lymph nodes positive ER 0%, PR 0%, HER2 negative, Ki-67 20%  CT CAP and bone scan: 06/08/2021: Multiple small lung nodules stable with the exception of new 4 mm left lower lobe lung nodule and a 6 mm right lower lobe lung nodule open (nonspecific)  Treatment plan: 1. Adjuvant chemotherapy with CMF x6 cycles 2. Adjuvant radiation ------------------------------------------------------------------------------------------------------- Current treatment: Cycle4 CMF Chemotoxicities: 1.Mild nausea for couple of days 2.Fatigue: Worsening fatigue.  I will reduce the dose of her chemo with cycle 4. 3.  Skin dryness of the hands: Encouraged her to use moisturizing lotion.  Return to clinic in3weeks for cycle 5 

## 2021-10-24 MED FILL — Dexamethasone Sodium Phosphate Inj 100 MG/10ML: INTRAMUSCULAR | Qty: 1 | Status: AC

## 2021-10-25 ENCOUNTER — Inpatient Hospital Stay: Payer: Medicare PPO | Admitting: Hematology and Oncology

## 2021-10-25 ENCOUNTER — Inpatient Hospital Stay: Payer: Medicare PPO

## 2021-10-25 ENCOUNTER — Encounter: Payer: Self-pay | Admitting: *Deleted

## 2021-10-25 ENCOUNTER — Inpatient Hospital Stay: Payer: Medicare PPO | Attending: Hematology and Oncology

## 2021-10-25 ENCOUNTER — Other Ambulatory Visit: Payer: Self-pay

## 2021-10-25 DIAGNOSIS — C50111 Malignant neoplasm of central portion of right female breast: Secondary | ICD-10-CM

## 2021-10-25 DIAGNOSIS — Z5111 Encounter for antineoplastic chemotherapy: Secondary | ICD-10-CM | POA: Diagnosis not present

## 2021-10-25 DIAGNOSIS — Z79899 Other long term (current) drug therapy: Secondary | ICD-10-CM | POA: Diagnosis not present

## 2021-10-25 DIAGNOSIS — Z171 Estrogen receptor negative status [ER-]: Secondary | ICD-10-CM | POA: Diagnosis not present

## 2021-10-25 DIAGNOSIS — C50512 Malignant neoplasm of lower-outer quadrant of left female breast: Secondary | ICD-10-CM

## 2021-10-25 DIAGNOSIS — D500 Iron deficiency anemia secondary to blood loss (chronic): Secondary | ICD-10-CM

## 2021-10-25 DIAGNOSIS — Z95828 Presence of other vascular implants and grafts: Secondary | ICD-10-CM

## 2021-10-25 LAB — CBC WITH DIFFERENTIAL (CANCER CENTER ONLY)
Abs Immature Granulocytes: 0.32 10*3/uL — ABNORMAL HIGH (ref 0.00–0.07)
Basophils Absolute: 0.1 10*3/uL (ref 0.0–0.1)
Basophils Relative: 1 %
Eosinophils Absolute: 0.4 10*3/uL (ref 0.0–0.5)
Eosinophils Relative: 6 %
HCT: 44.3 % (ref 36.0–46.0)
Hemoglobin: 14.7 g/dL (ref 12.0–15.0)
Immature Granulocytes: 5 %
Lymphocytes Relative: 15 %
Lymphs Abs: 0.9 10*3/uL (ref 0.7–4.0)
MCH: 29.9 pg (ref 26.0–34.0)
MCHC: 33.2 g/dL (ref 30.0–36.0)
MCV: 90 fL (ref 80.0–100.0)
Monocytes Absolute: 0.9 10*3/uL (ref 0.1–1.0)
Monocytes Relative: 14 %
Neutro Abs: 3.7 10*3/uL (ref 1.7–7.7)
Neutrophils Relative %: 59 %
Platelet Count: 259 10*3/uL (ref 150–400)
RBC: 4.92 MIL/uL (ref 3.87–5.11)
RDW: 14.6 % (ref 11.5–15.5)
WBC Count: 6.3 10*3/uL (ref 4.0–10.5)
nRBC: 0 % (ref 0.0–0.2)

## 2021-10-25 LAB — CMP (CANCER CENTER ONLY)
ALT: 19 U/L (ref 0–44)
AST: 19 U/L (ref 15–41)
Albumin: 4.1 g/dL (ref 3.5–5.0)
Alkaline Phosphatase: 75 U/L (ref 38–126)
Anion gap: 6 (ref 5–15)
BUN: 18 mg/dL (ref 8–23)
CO2: 29 mmol/L (ref 22–32)
Calcium: 9.7 mg/dL (ref 8.9–10.3)
Chloride: 101 mmol/L (ref 98–111)
Creatinine: 0.81 mg/dL (ref 0.44–1.00)
GFR, Estimated: >60 mL/min (ref >60)
Glucose, Bld: 90 mg/dL (ref 70–99)
Potassium: 3.6 mmol/L (ref 3.5–5.1)
Sodium: 136 mmol/L (ref 135–145)
Total Bilirubin: 0.4 mg/dL (ref 0.3–1.2)
Total Protein: 6.9 g/dL (ref 6.5–8.1)

## 2021-10-25 MED ORDER — SODIUM CHLORIDE 0.9% FLUSH
10.0000 mL | Freq: Once | INTRAVENOUS | Status: AC
  Administered 2021-10-25: 10 mL

## 2021-10-25 MED ORDER — SODIUM CHLORIDE 0.9 % IV SOLN: Freq: Once | INTRAVENOUS | Status: AC

## 2021-10-25 MED ORDER — METHOTREXATE SODIUM (PF) CHEMO INJECTION 250 MG/10ML
30.0000 mg/m2 | Freq: Once | INTRAMUSCULAR | Status: AC
  Administered 2021-10-25: 61 mg via INTRAVENOUS
  Filled 2021-10-25: qty 2.44

## 2021-10-25 MED ORDER — FLUOROURACIL CHEMO INJECTION 2.5 GM/50ML
400.0000 mg/m2 | Freq: Once | INTRAVENOUS | Status: AC
  Administered 2021-10-25: 800 mg via INTRAVENOUS
  Filled 2021-10-25: qty 16

## 2021-10-25 MED ORDER — SODIUM CHLORIDE 0.9 % IV SOLN
10.0000 mg | Freq: Once | INTRAVENOUS | Status: AC
  Administered 2021-10-25: 10 mg via INTRAVENOUS
  Filled 2021-10-25: qty 10

## 2021-10-25 MED ORDER — HEPARIN SOD (PORK) LOCK FLUSH 100 UNIT/ML IV SOLN
500.0000 [IU] | Freq: Once | INTRAVENOUS | Status: AC | PRN
  Administered 2021-10-25: 500 [IU]

## 2021-10-25 MED ORDER — SODIUM CHLORIDE 0.9% FLUSH
10.0000 mL | INTRAVENOUS | Status: DC | PRN
  Administered 2021-10-25: 10 mL

## 2021-10-25 MED ORDER — PALONOSETRON HCL INJECTION 0.25 MG/5ML
0.2500 mg | Freq: Once | INTRAVENOUS | Status: AC
  Administered 2021-10-25: 0.25 mg via INTRAVENOUS
  Filled 2021-10-25: qty 5

## 2021-10-25 MED ORDER — SODIUM CHLORIDE 0.9 % IV SOLN
500.0000 mg/m2 | Freq: Once | INTRAVENOUS | Status: AC
  Administered 2021-10-25: 1020 mg via INTRAVENOUS
  Filled 2021-10-25: qty 51

## 2021-10-25 NOTE — Progress Notes (Signed)
Patient Care Team: Donnajean Lopes, MD as PCP - General (Internal Medicine) Mauro Kaufmann, RN as Oncology Nurse Navigator Rockwell Germany, RN as Oncology Nurse Navigator  DIAGNOSIS:  Encounter Diagnosis  Name Primary?   Primary cancer of lower outer quadrant of left female breast ([place])     SUMMARY OF ONCOLOGIC HISTORY: Oncology History  Primary cancer of lower outer quadrant of left female breast ([place])  01/04/2012 Surgery   Left mastectomy: Invasive ductal carcinoma 0.5 cm with high-grade DCIS 9 cm, margins negative, 0/6 lymph nodes, ER 100%, PR 100%, Ki-67 17%, HER-2 negative ratio 1.16   02/07/2012 - 05/28/2016 Anti-estrogen oral therapy   Anastrozole 1 mg daily (stopped early due to Osteoporosis)   06/23/2021 Surgery   Right mastectomy: Grade 3 IDC with DCIS, 3.4 cm, margins negative, 2/3 lymph nodes positive ER 0%, PR 0%, HER2 negative, Ki-67 20%   Primary colon cancer ([place])  03/20/2014 Initial Diagnosis   Invasive adenocarcinoma of [place]   04/28/2014 Surgery   Right hemicolectomy: Invasive adenocarcinoma with mucinous features moderately differentiated invading subserosal tissue, margins negative, 25 lymph nodes negative, T3 N0 M0 stage II a, no microsatellite instability   Malignant neoplasm of central portion of right breast in female, estrogen receptor negative ([place])  06/01/2021 Initial Diagnosis   Malignant neoplasm of central portion of right breast in female, estrogen receptor negative ([place])   06/01/2021 Cancer Staging   Staging form: Breast, AJCC 8th Edition - Clinical: Stage IB (cT1c, cN0, cM0, G3, ER-, PR-, HER2-) - Signed by Nicholas Lose, MD on 06/01/2021 Histologic grading system: 3 grade system   08/02/2021 -  Chemotherapy   Patient is on Treatment Plan : BREAST Adjuvant CMF IV q21d     Cancer of central portion of right breast ([place])  06/23/2021 Initial Diagnosis   Cancer of central portion of right breast ([place])   08/02/2021 -  Chemotherapy   Patient is  on Treatment Plan : BREAST Adjuvant CMF IV q21d       CHIEF COMPLIANT: Cycle 5 CMF  INTERVAL HISTORY: Ann Maxwell is a 81 y.o. with above-mentioned history of left breast cancer who is currently on surveillance after completion of adjuvant antiestrogen therapy with anastrozole. She presents to the clinic today for a follow-up. Fatigue has significantly improved since we reduced the dosage of her treatment.   ALLERGIES:  is allergic to other, pollen extract, silicone, tape, tetracycline, tyloxapol, and terramycin [oxytetracycline].  MEDICATIONS:  Current Outpatient Medications  Medication Sig Dispense Refill   ADVAIR DISKUS 250-50 MCG/DOSE AEPB Inhale 1 puff into the lungs 2 (two) times daily. 180 each 0   albuterol (VENTOLIN HFA) 108 (90 Base) MCG/ACT inhaler Inhale 2 puffs into the lungs every 6 (six) hours as needed for wheezing or shortness of breath.     alendronate (FOSAMAX) 70 MG tablet Take 1 tablet (70 mg total) by mouth once a week. Take with a full glass of water on an empty stomach.     ALPRAZolam (XANAX) 0.25 MG tablet Take 1 tablet (0.25 mg total) by mouth at bedtime as needed for anxiety. 30 tablet 0   Docusate Sodium (DSS) 100 MG CAPS docusate sodium 100 mg capsule  TAKE 1 CAPSULE BY MOUTH 2 TIMES DAILY     gabapentin (NEURONTIN) 100 MG capsule Take 1 capsule (100 mg total) by mouth 3 (three) times daily. (Patient taking differently: Take 100 mg by mouth at bedtime.)     hydrochlorothiazide (MICROZIDE) 12.5 MG capsule Take 12.5  mg by mouth daily.     HYDROcodone-acetaminophen (NORCO/VICODIN) 5-325 MG tablet hydrocodone 5 mg-acetaminophen 325 mg tablet  TAKE 1 OR 2 TABLETS BY MOUTH EVERY 4 HOURS AS NEEDED FOR moderate PAIN     hydrocortisone (ANUSOL-HC) 2.5 % rectal cream Place 1 application. rectally 2 (two) times daily. 30 g 1   hydrocortisone 2.5 % cream hydrocortisone 2.5 % topical cream with perineal applicator  USE 1 application rectally 2 TIMES DAILY      lidocaine-prilocaine (EMLA) cream Apply to affected area once 30 g 3   loratadine (CLARITIN) 10 MG tablet Take 10 mg by mouth daily as needed for allergies.     losartan (COZAAR) 100 MG tablet losartan 100 mg tablet  TAKE 1 TABLET BY MOUTH EVERY DAY     losartan (COZAAR) 50 MG tablet Take 50 mg by mouth every evening.     melatonin 5 MG TABS Take 5 mg by mouth at bedtime as needed (sleep).     montelukast (SINGULAIR) 10 MG tablet Take 10 mg by mouth at bedtime.     ondansetron (ZOFRAN) 8 MG tablet Take 1 tablet (8 mg total) by mouth 2 (two) times daily as needed for refractory nausea / vomiting. Start on day 3 after chemotherapy. 30 tablet 1   polyvinyl alcohol (LIQUIFILM TEARS) 1.4 % ophthalmic solution Place 2 drops into both eyes as needed for dry eyes.     prochlorperazine (COMPAZINE) 10 MG tablet Take 1 tablet (10 mg total) by mouth every 6 (six) hours as needed (Nausea or vomiting). 30 tablet 1   Vitamin D, Ergocalciferol, (DRISDOL) 1.25 MG (50000 UNIT) CAPS capsule Take 50,000 Units by mouth every 7 (seven) days.     No current facility-administered medications for this visit.    PHYSICAL EXAMINATION: ECOG PERFORMANCE STATUS: 1 - Symptomatic but completely ambulatory  Vitals:   10/25/21 1138  BP: 118/74  Pulse: 68  Resp: 18  Temp: 97.8 F (36.6 C)  SpO2: 97%   Filed Weights   10/25/21 1138  Weight: 208 lb 8 oz (94.6 kg)      LABORATORY DATA:  I have reviewed the data as listed    Latest Ref Rng & Units 10/04/2021   11:01 AM 09/13/2021   10:55 AM 08/23/2021    9:51 AM  CMP  Glucose 70 - 99 mg/dL 106  94  108   BUN 8 - 23 mg/dL _0 Creatinine 0.44 - 1.00 mg/dL 0.82  0.67  0.65   Sodium 135 - 145 mmol/L 139  134  137   Potassium 3.5 - 5.1 mmol/L 3.7  4.2  3.5   Chloride 98 - 111 mmol/L 104  99  100   CO2 22 - 32 mmol/L _1 Calcium 8.9 - 10.3 mg/dL 9.8  9.4  9.4   Total Protein 6.5 - 8.1 g/dL 6.8  6.6  6.9   Total Bilirubin 0.3 - 1.2 mg/dL 0.3  0.4   0.5   Alkaline Phos 38 - 126 U/L 85  73  76   AST 15 - 41 U/L _2 ALT 0 - 44 U/L _3 Lab Results  Component Value Date   WBC 6.3 10/25/2021   HGB 14.7 10/25/2021   HCT 44.3 10/25/2021   MCV 90.0 10/25/2021   PLT 259 10/25/2021   NEUTROABS 3.7 10/25/2021    ASSESSMENT &  PLAN:  Primary cancer of lower outer quadrant of left female breast ([place]) Left breast cancer invasive ductal carcinoma: 0.5 cm with 9 mm area of DCIS status post left mastectomy 01/04/2012 ER/PR positive HER-2 negative Ki-67 17% currently on Arimidex 1 mg daily  Since 02/07/2012.  Completed 7 years of October 2020  (cecal adenocarcinoma diagnosed 04/22/2013, preop CEA of 5.1, status post right hemicolectomy 04/28/2014, grade 2, no MVI, 0/25 lymph nodes, subserosal involvement, T3 N0 M0 stage II A, no mismatch repair)   Contralateral breast cancer: 05/19/2021: Right breast mammogram and ultrasound: 1.6 cm mass in the right breast 12 o'clock position with architectural distortion highly suspicious for malignancy, no axillary lymph nodes, biopsy: Grade 3 IDC ER 0%, PR 0%, HER2 negative 1+ by IHC, Ki-67 20%    06/23/2021:Right mastectomy: Grade 3 IDC with DCIS, 3.4 cm, margins negative, 2/3 lymph nodes positive ER 0%, PR 0%, HER2 negative, Ki-67 20%    CT CAP and bone scan: 06/08/2021: Multiple small lung nodules stable with the exception of new 4 mm left lower lobe lung nodule and a 6 mm right lower lobe lung nodule open (nonspecific)   Treatment plan: Adjuvant chemotherapy with CMF x6 cycles Adjuvant radiation ------------------------------------------------------------------------------------------------------- Current treatment: Cycle 5 CMF Chemotoxicities: 1.  Mild nausea for couple of days 2. Fatigue:  Doing better since we reduced the dose of her chemo with cycle 4. 3.  Skin dryness of the hands: Encouraged her to use moisturizing lotion. 4.  Papular lesions on the right side of the face: She will  call her dermatologist to be checked out.   Return to clinic in 3 weeks for cycle 6 I sent a message to our navigators to schedule the patient for radiation consultation after she finishes with her chemo.    No orders of the defined types were placed in this encounter.  The patient has a good understanding of the overall plan. she agrees with it. she will call with any problems that may develop before the next visit here. Total time spent: 30 mins including face to face time and time spent for planning, charting and co-ordination of care   Harriette Ohara, MD 10/25/21    I Gardiner Coins am scribing for Dr. Lindi Adie  I have reviewed the above documentation for accuracy and completeness, and I agree with the above.

## 2021-10-25 NOTE — Assessment & Plan Note (Addendum)
Left breast cancer invasive ductal carcinoma: 0.5 cm with 9 mm area of DCIS status post left mastectomy 01/04/2012 ER/PR positive HER-2 negative Ki-67 17% currently on Arimidex 1 mg daily Since 02/07/2012.Completed 7 years of October 2020 (cecal adenocarcinoma diagnosed 04/22/2013, preop CEA of 5.1, status post right hemicolectomy 04/28/2014, grade 2, no MVI, 0/25 lymph nodes, subserosal involvement, T3 N0 M0 stage II A, no mismatch repair)  Contralateral breast cancer: 05/19/2021: Right breast mammogram and ultrasound: 1.6 cm mass in the right breast 12 o'clock position with architectural distortion highly suspicious for malignancy, no axillary lymph nodes, biopsy: Grade 3 IDC ER 0%, PR 0%, HER2 negative 1+ by IHC, Ki-67 20%  06/23/2021:Right mastectomy: Grade 3 IDC with DCIS, 3.4 cm, margins negative, 2/3 lymph nodes positive ER 0%, PR 0%, HER2 negative, Ki-67 20%  CT CAP and bone scan: 06/08/2021: Multiple small lung nodules stable with the exception of new 4 mm left lower lobe lung nodule and a 6 mm right lower lobe lung nodule open (nonspecific)  Treatment plan: 1. Adjuvant chemotherapy with CMF x6 cycles 2. Adjuvant radiation ------------------------------------------------------------------------------------------------------- Current treatment: Cycle5CMF Chemotoxicities: 1.Mild nausea for couple of days 2.Fatigue:  Doing better since we reduced the dose of her chemo with cycle 4. 3.  Skin dryness of the hands: Encouraged her to use moisturizing lotion. 4.  Papular lesions on the right side of the face: She will call her dermatologist to be checked out.  Return to clinic in3weeks for cycle6 I sent a message to our navigators to schedule the patient for radiation consultation after she finishes with her chemo.

## 2021-11-07 ENCOUNTER — Other Ambulatory Visit: Payer: Self-pay

## 2021-11-07 DIAGNOSIS — Z171 Estrogen receptor negative status [ER-]: Secondary | ICD-10-CM | POA: Diagnosis not present

## 2021-11-07 DIAGNOSIS — C50111 Malignant neoplasm of central portion of right female breast: Secondary | ICD-10-CM | POA: Diagnosis not present

## 2021-11-07 DIAGNOSIS — Z853 Personal history of malignant neoplasm of breast: Secondary | ICD-10-CM | POA: Diagnosis not present

## 2021-11-07 NOTE — Progress Notes (Signed)
Radiation Oncology         [phone] ________________________________  Initial Outpatient Consultation  Name: Ann Maxwell MRN: [id number]  Date: 11/08/2021  DOB: 03-19-1941  LF:[id number], Ermalene Searing, MD  Nicholas Lose, MD   REFERRING PHYSICIAN: Nicholas Lose, MD  DIAGNOSIS: No diagnosis found.  Stage IB (cT1c, cN0, cM0) Central Right Breast, Invasive ductal carcinoma with high-grade DCIS, ER- / PR- / Her2-, Grade 3, s/p right mastectomy and chemotherapy   History of left breast IDC in 2013, s/p mastectomy, lymph node dissection, and anastrozole (evaluated in breast clinic by Dr. Valere Dross but did not receive any radiation)  History of colon cancer (cecal adenocarcinoma) in 2015, s/p right hemicolectomy in 2016   Cancer Staging  Malignant neoplasm of central portion of right breast in female, estrogen receptor negative ([place]) Staging form: Breast, AJCC 8th Edition - Clinical: Stage IB (cT1c, cN0, cM0, G3, ER-, PR-, HER2-) - Signed by Nicholas Lose, MD on 06/01/2021  Primary cancer of lower outer quadrant of left female breast [facility]) Staging form: Breast, AJCC 7th Edition - Clinical stage from 12/20/2011: Stage 0 (Tis, N0, cM0) - Unsigned - Pathologic: No stage assigned - Unsigned  Primary colon cancer ([place]) Staging form: Colon and Rectum, AJCC 7th Edition - Pathologic: Stage IIA (T3, N0, cM0) - Signed by Rulon Eisenmenger, MD on 06/15/2014   CHIEF COMPLAINT: Here to discuss management of right breast cancer  HISTORY OF PRESENT ILLNESS::Ann Maxwell is a 81 y.o. female who initially presented with benign right breast calcifications seen on a bilateral screening mammogram performed on 05/17/20. These were seen to remain stable on follow up diagnostic imaging on 05/17/20 and 11/15/20. No symptoms, if any, were reported around that time. Follow up diagnostic right breast mammogram and right breast ultrasound on 05/19/21 revealed a new 1.6 cm right breast mass at the 12 o'clock position  highly suspicious of malignancy, with associated architectural distortion (and scattered benign right breast calcifications). No evidence of metastatic right axilla lymphadenopathy was appreciated.   Biopsy of the 12 o'clock right breast on date of 06/06/21 showed grade 3 invasive ductal carcinoma measuring 1.0 cm in the greatest linear extent.  ER status: 0% negative; PR status <1% negative; Proliferation marker Ki67 at 20%; Her2 status negative; Grade 3.  CT CAP performed on 06/06/21 demonstrated multiple small stable lung nodules, with the exception of a new 4 mm left lower lobe lung nodule, and an increase in size of a right lower lobe lung nodule measuring 6 mm, previously 4 mm in 2015 (both highly nonspecific). CT otherwise showed no evidence of metastatic disease in the abdomen or pelvis.   Whole body bone scan on 06/10/21 showed no evidence of osseous metastatic disease.  The patient opted to proceed with right breast mastectomy with SLN biopsies on 06/23/21 under the care of Dr. Barry Dienes. Pathology from the procedure revealed: tumor the size of 3.4 cm; histology of grade 3 invasive ductal carcinoma with high-grade DCIS; all margins negative for carcinoma; margin status to invasive and in-situ disease of 2.5 cm from the anterior/superior margin; nodal status of 2/3 right axillary sentinel lymph node excisions positive for metastatic carcinoma. ER status: 0% negative; PR status 0% negative; Proliferation marker Ki67 at 20%; Her2 status negative; Grade 3.   Genetic testing collected on 07/14/21 revealed no clinically significant variants detected by BRCAplus or +RNAinsight testing.   Chemotherapy: The patient began adjuvant chemotherapy with CMF x6 starting on 08/02/21 (Dr. Lindi Adie). The patient will complete her 84th  and final cycle sometime next week. Chemo toxicities encountered by the patient throughout the course of systemic treatment include mild nausea, fatigue (better since dose reduction with  cycle 4), and skin dryness. During her most recent follow up with Dr. Lindi Adie on 10/25/21, the patient was also found to have some papular lesions on the right side of the face, for which she will contact her dermatologist for further evaluation.    The patient has also been seeing OP rehab.  ***  PREVIOUS RADIATION THERAPY: No  PAST MEDICAL HISTORY:  has a past medical history of Allergy, Anemia, Anxiety, Asthma, Breast CA ([place]) (2013), Breast cancer ([place]) (2023), Cataract, Colon cancer ([place]), Depression, Environmental allergies, History of kidney stones, Hypertension, Internal hemorrhoids, Iron deficiency anemia, Obesity, Osteoarthritis, Osteoporosis, Transfusion history, and Vertigo.    PAST SURGICAL HISTORY: Past Surgical History:  Procedure Laterality Date   BREAST BIOPSY Right 05/27/2021   U/S Bx   BREAST SURGERY     CATARACT EXTRACTION, BILATERAL  2019   CHOLECYSTECTOMY     COLON RESECTION N/A 04/28/2014   Procedure: LAPAROSCOPIC  RIGHT HEMI-COLECTOMY;  Surgeon: Stark Klein, MD;  Location: [facility];  Service: General;  Laterality: N/A;   COLONOSCOPY     Lithectomy  2010   LITHOTRIPSY     MASTECTOMY Left 2013   MASTECTOMY W/ SENTINEL NODE BIOPSY  01/04/2012   Procedure: MASTECTOMY WITH SENTINEL LYMPH NODE BIOPSY;  Surgeon: Stark Klein, MD;  Location: [place];  Service: General;  Laterality: Left;   ORIF [place]     wtih subsequent pin removal in 1997   ORIF ULNAR / RADIAL SHAFT FRACTURE Left    POLYPECTOMY     PORTACATH PLACEMENT Left 06/23/2021   Procedure: INSERTION PORT-A-CATH;  Surgeon: Stark Klein, MD;  Location: [place];  Service: General;  Laterality: Left;   SENTINEL NODE BIOPSY Right 06/23/2021   Procedure: SENTINEL LYMPH NODE BIOPSY;  Surgeon: Stark Klein, MD;  Location: [place];  Service: General;  Laterality: Right;   SIMPLE MASTECTOMY WITH AXILLARY SENTINEL NODE BIOPSY Right 06/23/2021   Procedure: RIGHT MASTECTOMY;   Surgeon: Stark Klein, MD;  Location: [place];  Service: General;  Laterality: Right;   TONSILLECTOMY AND ADENOIDECTOMY     TOTAL KNEE ARTHROPLASTY Left 08/23/2015   Procedure: TOTAL KNEE ARTHROPLASTY;  Surgeon: Gaynelle Arabian, MD;  Location: [facility];  Service: Orthopedics;  Laterality: Left;   VAGINAL HYSTERECTOMY  20 yrs. ago   with BSO    FAMILY HISTORY: family history includes Congestive Heart Failure in her father; Heart disease in her father; Hypertension in her father; Liver cancer (age of onset: 81) in her paternal grandfather; Ovarian cancer (age of onset: 32) in her mother; Stroke in her father; Thyroid cancer (age of onset: 32) in her sister; Uterine cancer in her maternal grandmother.  SOCIAL HISTORY:  reports that she has never smoked. She has never used smokeless tobacco. She reports current alcohol use. She reports that she does not use drugs.  ALLERGIES: Other, Pollen extract, Silicone, Tape, Tetracycline, Tyloxapol, and Terramycin [oxytetracycline]  MEDICATIONS:  Current Outpatient Medications  Medication Sig Dispense Refill   ADVAIR DISKUS 250-50 MCG/DOSE AEPB Inhale 1 puff into the lungs 2 (two) times daily. 180 each 0   albuterol (VENTOLIN HFA) 108 (90 Base) MCG/ACT inhaler Inhale 2 puffs into the lungs every 6 (six) hours as needed for wheezing or shortness of breath.     alendronate (FOSAMAX) 70 MG tablet  Take 1 tablet (70 mg total) by mouth once a week. Take with a full glass of water on an empty stomach.     ALPRAZolam (XANAX) 0.25 MG tablet Take 1 tablet (0.25 mg total) by mouth at bedtime as needed for anxiety. 30 tablet 0   Docusate Sodium (DSS) 100 MG CAPS docusate sodium 100 mg capsule  TAKE 1 CAPSULE BY MOUTH 2 TIMES DAILY     gabapentin (NEURONTIN) 100 MG capsule Take 1 capsule (100 mg total) by mouth 3 (three) times daily. (Patient taking differently: Take 100 mg by mouth at bedtime.)     hydrochlorothiazide (MICROZIDE) 12.5 MG capsule Take  12.5 mg by mouth daily.     HYDROcodone-acetaminophen (NORCO/VICODIN) 5-325 MG tablet hydrocodone 5 mg-acetaminophen 325 mg tablet  TAKE 1 OR 2 TABLETS BY MOUTH EVERY 4 HOURS AS NEEDED FOR moderate PAIN     hydrocortisone (ANUSOL-HC) 2.5 % rectal cream Place 1 application. rectally 2 (two) times daily. 30 g 1   hydrocortisone 2.5 % cream hydrocortisone 2.5 % topical cream with perineal applicator  USE 1 application rectally 2 TIMES DAILY     lidocaine-prilocaine (EMLA) cream Apply to affected area once 30 g 3   loratadine (CLARITIN) 10 MG tablet Take 10 mg by mouth daily as needed for allergies.     losartan (COZAAR) 100 MG tablet losartan 100 mg tablet  TAKE 1 TABLET BY MOUTH EVERY DAY     losartan (COZAAR) 50 MG tablet Take 50 mg by mouth every evening.     melatonin 5 MG TABS Take 5 mg by mouth at bedtime as needed (sleep).     montelukast (SINGULAIR) 10 MG tablet Take 10 mg by mouth at bedtime.     ondansetron (ZOFRAN) 8 MG tablet Take 1 tablet (8 mg total) by mouth 2 (two) times daily as needed for refractory nausea / vomiting. Start on day 3 after chemotherapy. 30 tablet 1   polyvinyl alcohol (LIQUIFILM TEARS) 1.4 % ophthalmic solution Place 2 drops into both eyes as needed for dry eyes.     prochlorperazine (COMPAZINE) 10 MG tablet Take 1 tablet (10 mg total) by mouth every 6 (six) hours as needed (Nausea or vomiting). 30 tablet 1   Vitamin D, Ergocalciferol, (DRISDOL) 1.25 MG (50000 UNIT) CAPS capsule Take 50,000 Units by mouth every 7 (seven) days.     No current facility-administered medications for this encounter.    REVIEW OF SYSTEMS: As above in HPI.   PHYSICAL EXAM:  vitals were not taken for this visit.   General: Alert and oriented, in no acute distress HEENT: Head is normocephalic. Extraocular movements are intact. Oropharynx is clear. Neck: Neck is supple, no palpable cervical or supraclavicular lymphadenopathy. Heart: Regular in rate and rhythm with no murmurs, rubs, or  gallops. Chest: Clear to auscultation bilaterally, with no rhonchi, wheezes, or rales. Abdomen: Soft, nontender, nondistended, with no rigidity or guarding. Extremities: No cyanosis or edema. Lymphatics: see Neck Exam Skin: No concerning lesions. Musculoskeletal: symmetric strength and muscle tone throughout. Neurologic: Cranial nerves II through XII are grossly intact. No obvious focalities. Speech is fluent. Coordination is intact. Psychiatric: Judgment and insight are intact. Affect is appropriate. Breasts: *** . No other palpable masses appreciated in the breasts or axillae *** .    ECOG = ***  0 - Asymptomatic (Fully active, able to carry on all predisease activities without restriction)  1 - Symptomatic but completely ambulatory (Restricted in physically strenuous activity but ambulatory and able to carry  out work of a light or sedentary nature. For example, light housework, office work)  2 - Symptomatic, <50% in bed during the day (Ambulatory and capable of all self care but unable to carry out any work activities. Up and about more than 50% of waking hours)  3 - Symptomatic, >50% in bed, but not bedbound (Capable of only limited self-care, confined to bed or chair 50% or more of waking hours)  4 - Bedbound (Completely disabled. Cannot carry on any self-care. Totally confined to bed or chair)  5 - Death   Eustace Pen MM, Creech RH, Tormey DC, et al. [phone]). "Toxicity and response criteria of the [facility]". [place] Oncol. 5 (6): 649-55   LABORATORY DATA:  Lab Results  Component Value Date   WBC 6.3 10/25/2021   HGB 14.7 10/25/2021   HCT 44.3 10/25/2021   MCV 90.0 10/25/2021   PLT 259 10/25/2021   CMP     Component Value Date/Time   NA 136 10/25/2021 1132   NA 140 05/05/2016 1552   K 3.6 10/25/2021 1132   K 3.5 05/05/2016 1552   CL 101 10/25/2021 1132   CL 102 09/23/2012 1028   CO2 29 10/25/2021 1132   CO2 25 05/05/2016 1552   GLUCOSE 90  10/25/2021 1132   GLUCOSE 115 05/05/2016 1552   GLUCOSE 103 (H) 09/23/2012 1028   BUN 18 10/25/2021 1132   BUN 17.5 05/05/2016 1552   CREATININE 0.81 10/25/2021 1132   CREATININE 0.8 05/05/2016 1552   CALCIUM 9.7 10/25/2021 1132   CALCIUM 10.0 05/05/2016 1552   PROT 6.9 10/25/2021 1132   PROT 7.8 05/05/2016 1552   ALBUMIN 4.1 10/25/2021 1132   ALBUMIN 4.0 05/05/2016 1552   AST 19 10/25/2021 1132   AST 20 05/05/2016 1552   ALT 19 10/25/2021 1132   ALT 23 05/05/2016 1552   ALKPHOS 75 10/25/2021 1132   ALKPHOS 113 05/05/2016 1552   BILITOT 0.4 10/25/2021 1132   BILITOT 0.32 05/05/2016 1552   GFRNONAA >60 10/25/2021 1132   GFRAA >60 06/11/2019 1039         RADIOGRAPHY: No results found.    IMPRESSION/PLAN: ***   It was a pleasure meeting the patient today. We discussed the risks, benefits, and side effects of radiotherapy. I recommend radiotherapy to the *** to reduce her risk of locoregional recurrence by 2/3.  We discussed that radiation would take approximately *** weeks to complete and that I would give the patient a few weeks to heal following surgery before starting treatment planning. *** If chemotherapy were to be given, this would precede radiotherapy. We spoke about acute effects including skin irritation and fatigue as well as much less common late effects including internal organ injury or irritation. We spoke about the latest technology that is used to minimize the risk of late effects for patients undergoing radiotherapy to the breast or chest wall. No guarantees of treatment were given. The patient is enthusiastic about proceeding with treatment. I look forward to participating in the patient's care.  I will await her referral back to me for postoperative follow-up and eventual CT simulation/treatment planning.  On date of service, in total, I spent *** minutes on this encounter. Patient was seen in person.   __________________________________________   Eppie Gibson,  MD  This document serves as a record of services personally performed by Eppie Gibson, MD. It was created on her behalf by Roney Mans, a trained medical scribe. The creation of this  record is based on the scribe's personal observations and the provider's statements to them. This document has been checked and approved by the attending provider.

## 2021-11-07 NOTE — Progress Notes (Signed)
Location of Breast Cancer:  Malignant neoplasm of central portion of right breast in female, estrogen receptor negative  Histology per Pathology Report:  06/23/2021 FINAL MICROSCOPIC DIAGNOSIS:  A. BREAST, RIGHT, MASTECTOMY:  - Invasive and in situ ductal carcinoma, 3.4 cm.  - Surgical margins negative for carcinoma.  - Biopsy site and biopsy clip.  - See oncology table.  B. LYMPH NODE, SENTINEL, RIGHT AXILLARY #1, BIOPSY:  - One lymph node negative for metastatic carcinoma (0/1).  C. LYMPH NODE, SENTINEL, RIGHT AXILLARY #2, BIOPSY:  - Metastatic carcinoma in one lymph node (1/1).  - Metastasis is 0.8 cm.  D. LYMPH NODE, SENTINEL, RIGHT AXILLARY #3, BIOPSY:  - Metastatic carcinoma in one lymph node (1/1).  - Metastasis is 0.5 cm.   Receptor Status: ER(Negative), PR (Negative), Her2-neu (Negative via IHC), Ki-67(20%)  Did patient present with symptoms (if so, please note symptoms) or was this found on screening mammography?: 05/19/2021: Right breast mammogram and ultrasound: 1.6 cm mass in the right breast 12 o'clock position with architectural distortion highly suspicious for malignancy, no axillary lymph nodes  History of left breast cancer  01/04/2012 Surgery    Left mastectomy: Invasive ductal carcinoma 0.5 cm with high-grade DCIS 9 cm, margins negative, 0/6 lymph nodes, ER 100%, PR 100%, Ki-67 17%, HER-2 negative ratio 1.16    02/07/2012 - 05/28/2016 Anti-estrogen oral therapy    Anastrozole 1 mg daily (stopped early due to Osteoporosis)    Past/Anticipated interventions by surgeon, if any:  06/23/2021 --Dr. Stark Klein  Right Mastectomy with Sentinel Node Biopsy  Left subclavian port placement  Past/Anticipated interventions by medical oncology, if any:  Under care of Dr. Nicholas Lose 10/25/2021 --Treatment plan: Adjuvant chemotherapy with CMF x6 cycles Adjuvant radiation --Current treatment: Cycle 5 CMF Chemotoxicities: Mild nausea for couple of days Fatigue:  Doing  better since we reduced the dose of her chemo with cycle 4. Skin dryness of the hands: Encouraged her to use moisturizing lotion. Papular lesions on the right side of the face: She will call her dermatologist to be checked out.  Return to clinic in 3 weeks for cycle 6 (final cycle: 11/15/2021) I sent a message to our navigators to schedule the patient for radiation consultation after she finishes with her chemo.  Lymphedema issues, if any:  Reports she saw Dr. Barry Dienes yesterday and was told she had mild swelling in axilla, but otherwise no symptoms.    Pain issues, if any: Patient denies, but does have some difficulty with range of motion to her right shoulder (shoulder pops and clicks when she raises her arm)  SAFETY ISSUES: Prior radiation? No Pacemaker/ICD? No Possible current pregnancy? No--hysterectomy Is the patient on methotrexate? Yes (CMF regimen)--final  cycle scheduled for 11/15/21  Current Complaints / other details:   History of colon cancer: Primary colon cancer ([place])  03/20/2014 Initial Diagnosis    Invasive adenocarcinoma of Caecum    04/28/2014 Surgery    Right hemicolectomy: Invasive adenocarcinoma with mucinous features moderately differentiated invading subserosal tissue, margins negative, 25 lymph nodes negative, T3 N0 M0 stage II a, no microsatellite instability

## 2021-11-08 ENCOUNTER — Ambulatory Visit
Admission: RE | Admit: 2021-11-08 | Discharge: 2021-11-08 | Disposition: A | Discharge status: Home / Self Care | Payer: Medicare PPO | Source: Ambulatory Visit | Attending: Radiation Oncology | Admitting: Radiation Oncology

## 2021-11-08 ENCOUNTER — Other Ambulatory Visit: Payer: Self-pay

## 2021-11-08 ENCOUNTER — Encounter: Payer: Self-pay | Admitting: Radiation Oncology

## 2021-11-08 VITALS — HR 86 | Temp 97.3°F | Resp 20 | Ht 63.5 in | Wt 206.8 lb

## 2021-11-08 DIAGNOSIS — Z800 Family history of malignant neoplasm of digestive organs: Secondary | ICD-10-CM | POA: Insufficient documentation

## 2021-11-08 DIAGNOSIS — Z87442 Personal history of urinary calculi: Secondary | ICD-10-CM | POA: Insufficient documentation

## 2021-11-08 DIAGNOSIS — C50111 Malignant neoplasm of central portion of right female breast: Secondary | ICD-10-CM

## 2021-11-08 DIAGNOSIS — F419 Anxiety disorder, unspecified: Secondary | ICD-10-CM | POA: Insufficient documentation

## 2021-11-08 DIAGNOSIS — R5383 Other fatigue: Secondary | ICD-10-CM | POA: Insufficient documentation

## 2021-11-08 DIAGNOSIS — I10 Essential (primary) hypertension: Secondary | ICD-10-CM | POA: Diagnosis not present

## 2021-11-08 DIAGNOSIS — Z8719 Personal history of other diseases of the digestive system: Secondary | ICD-10-CM | POA: Diagnosis not present

## 2021-11-08 DIAGNOSIS — J45909 Unspecified asthma, uncomplicated: Secondary | ICD-10-CM | POA: Diagnosis not present

## 2021-11-08 DIAGNOSIS — Z853 Personal history of malignant neoplasm of breast: Secondary | ICD-10-CM | POA: Insufficient documentation

## 2021-11-08 DIAGNOSIS — Z8041 Family history of malignant neoplasm of ovary: Secondary | ICD-10-CM | POA: Diagnosis not present

## 2021-11-08 DIAGNOSIS — Z79899 Other long term (current) drug therapy: Secondary | ICD-10-CM | POA: Insufficient documentation

## 2021-11-08 DIAGNOSIS — Z171 Estrogen receptor negative status [ER-]: Secondary | ICD-10-CM | POA: Diagnosis not present

## 2021-11-08 DIAGNOSIS — M1990 Unspecified osteoarthritis, unspecified site: Secondary | ICD-10-CM | POA: Diagnosis not present

## 2021-11-08 DIAGNOSIS — R591 Generalized enlarged lymph nodes: Secondary | ICD-10-CM | POA: Diagnosis not present

## 2021-11-09 ENCOUNTER — Other Ambulatory Visit: Payer: Self-pay

## 2021-11-14 MED FILL — Dexamethasone Sodium Phosphate Inj 100 MG/10ML: INTRAMUSCULAR | Qty: 1 | Status: AC

## 2021-11-15 ENCOUNTER — Inpatient Hospital Stay: Payer: Medicare PPO

## 2021-11-15 ENCOUNTER — Inpatient Hospital Stay (HOSPITAL_BASED_OUTPATIENT_CLINIC_OR_DEPARTMENT_OTHER): Payer: Medicare PPO | Admitting: Adult Health

## 2021-11-15 ENCOUNTER — Encounter: Payer: Self-pay | Admitting: *Deleted

## 2021-11-15 ENCOUNTER — Inpatient Hospital Stay: Payer: Medicare PPO | Attending: Hematology and Oncology

## 2021-11-15 ENCOUNTER — Other Ambulatory Visit: Payer: Self-pay

## 2021-11-15 VITALS — BP 118/80

## 2021-11-15 VITALS — HR 78 | Temp 97.5°F | Resp 18 | Ht 63.5 in | Wt 211.2 lb

## 2021-11-15 DIAGNOSIS — C50111 Malignant neoplasm of central portion of right female breast: Secondary | ICD-10-CM

## 2021-11-15 DIAGNOSIS — Z5111 Encounter for antineoplastic chemotherapy: Secondary | ICD-10-CM | POA: Diagnosis not present

## 2021-11-15 DIAGNOSIS — C189 Malignant neoplasm of colon, unspecified: Secondary | ICD-10-CM

## 2021-11-15 DIAGNOSIS — D500 Iron deficiency anemia secondary to blood loss (chronic): Secondary | ICD-10-CM

## 2021-11-15 DIAGNOSIS — Z95828 Presence of other vascular implants and grafts: Secondary | ICD-10-CM

## 2021-11-15 DIAGNOSIS — C50512 Malignant neoplasm of lower-outer quadrant of left female breast: Secondary | ICD-10-CM | POA: Insufficient documentation

## 2021-11-15 DIAGNOSIS — Z171 Estrogen receptor negative status [ER-]: Secondary | ICD-10-CM | POA: Diagnosis not present

## 2021-11-15 LAB — CMP (CANCER CENTER ONLY)
ALT: 23 U/L (ref 0–44)
AST: 22 U/L (ref 15–41)
Albumin: 3.9 g/dL (ref 3.5–5.0)
Alkaline Phosphatase: 79 U/L (ref 38–126)
Anion gap: 5 (ref 5–15)
BUN: 12 mg/dL (ref 8–23)
CO2: 30 mmol/L (ref 22–32)
Calcium: 9.3 mg/dL (ref 8.9–10.3)
Chloride: 103 mmol/L (ref 98–111)
Creatinine: 0.64 mg/dL (ref 0.44–1.00)
GFR, Estimated: >60 mL/min (ref >60)
Glucose, Bld: 111 mg/dL — ABNORMAL HIGH (ref 70–99)
Potassium: 3.4 mmol/L — ABNORMAL LOW (ref 3.5–5.1)
Sodium: 138 mmol/L (ref 135–145)
Total Bilirubin: 0.3 mg/dL (ref 0.3–1.2)
Total Protein: 6.5 g/dL (ref 6.5–8.1)

## 2021-11-15 LAB — CBC WITH DIFFERENTIAL (CANCER CENTER ONLY)
Abs Immature Granulocytes: 0.19 10*3/uL — ABNORMAL HIGH (ref 0.00–0.07)
Basophils Absolute: 0.1 10*3/uL (ref 0.0–0.1)
Basophils Relative: 1 %
Eosinophils Absolute: 0.3 10*3/uL (ref 0.0–0.5)
Eosinophils Relative: 5 %
HCT: 41.5 % (ref 36.0–46.0)
Hemoglobin: 13.9 g/dL (ref 12.0–15.0)
Immature Granulocytes: 4 %
Lymphocytes Relative: 15 %
Lymphs Abs: 0.8 10*3/uL (ref 0.7–4.0)
MCH: 30.4 pg (ref 26.0–34.0)
MCHC: 33.5 g/dL (ref 30.0–36.0)
MCV: 90.8 fL (ref 80.0–100.0)
Monocytes Absolute: 0.8 10*3/uL (ref 0.1–1.0)
Monocytes Relative: 16 %
Neutro Abs: 3.1 10*3/uL (ref 1.7–7.7)
Neutrophils Relative %: 59 %
Platelet Count: 227 10*3/uL (ref 150–400)
RBC: 4.57 MIL/uL (ref 3.87–5.11)
RDW: 14.3 % (ref 11.5–15.5)
WBC Count: 5.1 10*3/uL (ref 4.0–10.5)
nRBC: 0 % (ref 0.0–0.2)

## 2021-11-15 MED ORDER — FLUOROURACIL CHEMO INJECTION 2.5 GM/50ML
400.0000 mg/m2 | Freq: Once | INTRAVENOUS | Status: AC
  Administered 2021-11-15: 800 mg via INTRAVENOUS
  Filled 2021-11-15: qty 16

## 2021-11-15 MED ORDER — PALONOSETRON HCL INJECTION 0.25 MG/5ML
0.2500 mg | Freq: Once | INTRAVENOUS | Status: AC
  Administered 2021-11-15: 0.25 mg via INTRAVENOUS
  Filled 2021-11-15: qty 5

## 2021-11-15 MED ORDER — SODIUM CHLORIDE 0.9 % IV SOLN
500.0000 mg/m2 | Freq: Once | INTRAVENOUS | Status: AC
  Administered 2021-11-15: 1020 mg via INTRAVENOUS
  Filled 2021-11-15: qty 51

## 2021-11-15 MED ORDER — SODIUM CHLORIDE 0.9 % IV SOLN
10.0000 mg | Freq: Once | INTRAVENOUS | Status: AC
  Administered 2021-11-15: 10 mg via INTRAVENOUS
  Filled 2021-11-15: qty 10

## 2021-11-15 MED ORDER — SODIUM CHLORIDE 0.9 % IV SOLN: Freq: Once | INTRAVENOUS | Status: AC

## 2021-11-15 MED ORDER — SODIUM CHLORIDE 0.9% FLUSH
10.0000 mL | Freq: Once | INTRAVENOUS | Status: AC
  Administered 2021-11-15: 10 mL

## 2021-11-15 MED ORDER — SODIUM CHLORIDE 0.9% FLUSH
10.0000 mL | INTRAVENOUS | Status: DC | PRN
  Administered 2021-11-15: 10 mL

## 2021-11-15 MED ORDER — METHOTREXATE SODIUM (PF) CHEMO INJECTION 250 MG/10ML
30.0000 mg/m2 | Freq: Once | INTRAMUSCULAR | Status: AC
  Administered 2021-11-15: 61 mg via INTRAVENOUS
  Filled 2021-11-15: qty 2.44

## 2021-11-15 MED ORDER — HEPARIN SOD (PORK) LOCK FLUSH 100 UNIT/ML IV SOLN
500.0000 [IU] | Freq: Once | INTRAVENOUS | Status: AC | PRN
  Administered 2021-11-15: 500 [IU]

## 2021-11-15 NOTE — Patient Instructions (Signed)
[facility] MEDICAL [facility]COLOG[facility]  Discharge Instructions: Thank you for choosing [facility] to provide your oncology and hematology care.   If you have a lab appointment with the Cancer Center, please go directly to the Cancer Center and check in at the registration area.   Wear comfortable clothing and clothing appropriate for easy access to any Portacath or PICC line.   We strive to give you quality time with your provider. You may need to reschedule your appointment if you arrive late (15 or more minutes).  Arriving late affects you and other patients whose appointments are after yours.  Also, if you miss three or more appointments without notifying the office, you may be dismissed from the clinic at the provider's discretion.      For prescription refill requests, have your pharmacy contact our office and allow 72 hours for refills to be completed.    Today you received the following chemotherapy and/or immunotherapy agents: Cytoxan/Methotrexate/Fluorouracil      To help prevent nausea and vomiting after your treatment, we encourage you to take your nausea medication as directed.  BELOW ARE SYMPTOMS THAT SHOULD BE REPORTED IMMEDIATELY: *FEVER GREATER THAN 100.4 F (38 C) OR HIGHER *CHILLS OR SWEATING *NAUSEA AND VOMITING THAT IS NOT CONTROLLED WITH YOUR NAUSEA MEDICATION *UNUSUAL SHORTNESS OF BREATH *UNUSUAL BRUISING OR BLEEDING *URINARY PROBLEMS (pain or burning when urinating, or frequent urination) *BOWEL PROBLEMS (unusual diarrhea, constipation, pain near the anus) TENDERNESS IN MOUTH AND THROAT WITH OR WITHOUT PRESENCE OF ULCERS (sore throat, sores in mouth, or a toothache) UNUSUAL RASH, SWELLING OR PAIN  UNUSUAL VAGINAL DISCHARGE OR ITCHING   Items with * indicate a potential emergency and should be followed up as soon as possible or go to the Emergency Department if any problems should occur.  Please show the CHEMOTHERAPY ALERT CARD or IMMUNOTHERAPY  ALERT CARD at check-in to the Emergency Department and triage nurse.  Should you have questions after your visit or need to cancel or reschedule your appointment, please contact [facility]  Dept: [phone]  and follow the prompts.  Office hours are 8:00 a.m. to 4:30 p.m. Monday - Friday. Please note that voicemails left after 4:00 p.m. may not be returned until the following business day.  We are closed weekends and major holidays. You have access to a nurse at all times for urgent questions. Please call the main number to the clinic Dept: [phone] and follow the prompts.   For any non-urgent questions, you may also contact your provider using MyChart. We now offer e-Visits for anyone 18 and older to request care online for non-urgent symptoms. For details visit [email or web address].   Also download the MyChart app! Go to the app store, search "MyChart", open the app, select [facility], and log in with your MyChart username and password.  Masks are optional in the cancer centers. If you would like for your care team to wear a mask while they are taking care of you, please let them know. You may have one support person who is at least 79 years old accompany you for your appointments. 

## 2021-11-15 NOTE — Progress Notes (Unsigned)
Ann Maxwell:    Donnajean Lopes, MD [place] [address]   DIAGNOSIS: Cancer Staging  Cancer of central portion of right breast [facility]) Staging form: Breast, AJCC 8th Edition - Pathologic stage from 11/08/2021: Stage IIIA (pT2, pN1, cM0, G3, ER-, PR-, HER2-) - Signed by Eppie Gibson, MD on 11/08/2021 Stage prefix: Initial diagnosis Histologic grading system: 3 grade system  Malignant neoplasm of central portion of right breast in female, estrogen receptor negative ([place]) Staging form: Breast, AJCC 8th Edition - Clinical: Stage IB (cT1c, cN0, cM0, G3, ER-, PR-, HER2-) - Signed by Nicholas Lose, MD on 06/01/2021 Histologic grading system: 3 grade system  Primary cancer of lower outer quadrant of left female breast [facility]) Staging form: Breast, AJCC 7th Edition - Clinical stage from 12/20/2011: Stage 0 (Tis, N0, cM0) - Unsigned Staged by: Pathologist and managing physician Specimen type: Core Needle Biopsy Histopathologic type: 9932 Laterality: Left - Pathologic: No stage assigned - Unsigned Specimen type: Core Needle Biopsy Histopathologic type: 9932 Laterality: Left  Primary colon cancer ([place]) Staging form: Colon and Rectum, AJCC 7th Edition - Pathologic: Stage IIA (T3, N0, cM0) - Signed by Rulon Eisenmenger, MD on 06/15/2014 Laterality: Right Tumor size (mm): 94 Histologic grade (G): G2 Lymph-vascular invasion (LVI): LVI not present (absent)/not identified Residual tumor (R): R0 - None Tumor deposits (TD): Absent Perineural invasion (PNI): Absent KRAS gene analysis: Not assessed Stage used in treatment planning: Yes National guidelines used in treatment planning: Yes Type of national guideline used in treatment planning: NCCN   SUMMARY OF ONCOLOGIC HISTORY: Oncology History  Primary cancer of lower outer quadrant of left female breast ([place])  01/04/2012 Surgery   Left mastectomy: Invasive ductal carcinoma 0.5 cm with high-grade  DCIS 9 cm, margins negative, 0/6 lymph nodes, ER 100%, PR 100%, Ki-67 17%, HER-2 negative ratio 1.16   02/07/2012 - 05/28/2016 Anti-estrogen oral therapy   Anastrozole 1 mg daily (stopped early due to Osteoporosis)   06/23/2021 Surgery   Right mastectomy: Grade 3 IDC with DCIS, 3.4 cm, margins negative, 2/3 lymph nodes positive ER 0%, PR 0%, HER2 negative, Ki-67 20%   Primary colon cancer ([place])  03/20/2014 Initial Diagnosis   Invasive adenocarcinoma of [place]   04/28/2014 Surgery   Right hemicolectomy: Invasive adenocarcinoma with mucinous features moderately differentiated invading subserosal tissue, margins negative, 25 lymph nodes negative, T3 N0 M0 stage II a, no microsatellite instability   Malignant neoplasm of central portion of right breast in female, estrogen receptor negative ([place])  06/01/2021 Initial Diagnosis   Malignant neoplasm of central portion of right breast in female, estrogen receptor negative ([place])   06/01/2021 Cancer Staging   Staging form: Breast, AJCC 8th Edition - Clinical: Stage IB (cT1c, cN0, cM0, G3, ER-, PR-, HER2-) - Signed by Nicholas Lose, MD on 06/01/2021 Histologic grading system: 3 grade system   08/02/2021 -  Chemotherapy   Patient is on Treatment Plan : BREAST Adjuvant CMF IV q21d     Cancer of central portion of right breast ([place])  06/23/2021 Initial Diagnosis   Cancer of central portion of right breast ([place])   08/02/2021 -  Chemotherapy   Patient is on Treatment Plan : BREAST Adjuvant CMF IV q21d     11/08/2021 Cancer Staging   Staging form: Breast, AJCC 8th Edition - Pathologic stage from 11/08/2021: Stage IIIA (pT2, pN1, cM0, G3, ER-, PR-, HER2-) - Signed by Eppie Gibson, MD on 11/08/2021 Stage prefix: Initial diagnosis Histologic grading  system: 3 grade system     CURRENT THERAPY: CMF #6  INTERVAL HISTORY: Ann Maxwell 81 y.o. female returns for f/u and evaluation prior to her final cycle of adjuvant chemotherapy.  She is scheduled for  radiation simulation on 11/28/2021.   Patient Active Problem List   Diagnosis Date Noted   Port-A-Cath in place 08/01/2021   Genetic testing 07/26/2021   Family history of ovarian cancer 07/15/2021   Cancer of central portion of right breast ([place]) 06/23/2021   Malignant neoplasm of central portion of right breast in female, estrogen receptor negative ([place]) 06/01/2021   Pain in right knee 06/12/2019   Asthma 10/17/2017   OA (osteoarthritis) of knee 08/23/2015   Primary colon cancer ([place]) 03/31/2014   Iron deficiency anemia due to chronic blood loss 07/01/2013   Sinus tachycardia 09/12/2012   Mobitz type 1 second degree AV block 09/12/2012   Chest pain 09/11/2012   HTN (hypertension) 09/11/2012   Asthma, chronic 09/11/2012   Primary cancer of lower outer quadrant of left female breast ([place]) 12/12/2011    is allergic to other, pollen extract, silicone, tape, tetracycline, tyloxapol, and terramycin [oxytetracycline].  MEDICAL HISTORY: Past Medical History:  Diagnosis Date   Allergy    Anemia    iron infusion -last done 1 month ago(12-15, and 12-22 -15 [facility])- Dr. Lindi Adie   Anxiety    Asthma    Breast CA [facility]) 2013   a. L breast DCIS, s/p L mastectomy 12/2011. no further issues   Breast cancer ([place]) 2023   Right breast IDC   Cataract    bilat removed    Colon cancer ([place])    Depression    pt denies any hx of depression    Environmental allergies    History of kidney stones    Hypertension    controlled on meds    Internal hemorrhoids    remains an issue- no bright bleeding seen recent   Iron deficiency anemia    Obesity    Osteoarthritis    arthritis- knees and shoulders   Osteoporosis    drug induced and on Fosamax   Transfusion history    2 yrs ago due to anemia   Vertigo    no recent issues    SURGICAL HISTORY: Past Surgical History:  Procedure Laterality Date   BREAST BIOPSY Right 05/27/2021   U/S Bx   BREAST SURGERY     CATARACT EXTRACTION, BILATERAL   2019   CHOLECYSTECTOMY     COLON RESECTION N/A 04/28/2014   Procedure: LAPAROSCOPIC  RIGHT HEMI-COLECTOMY;  Surgeon: Stark Klein, MD;  Location: [facility];  Service: General;  Laterality: N/A;   COLONOSCOPY     Lithectomy  2010   LITHOTRIPSY     MASTECTOMY Left 2013   MASTECTOMY W/ SENTINEL NODE BIOPSY  01/04/2012   Procedure: MASTECTOMY WITH SENTINEL LYMPH NODE BIOPSY;  Surgeon: Stark Klein, MD;  Location: [place];  Service: General;  Laterality: Left;   ORIF [place]     wtih subsequent pin removal in 1997   ORIF ULNAR / RADIAL SHAFT FRACTURE Left    POLYPECTOMY     PORTACATH PLACEMENT Left 06/23/2021   Procedure: INSERTION PORT-A-CATH;  Surgeon: Stark Klein, MD;  Location: [place];  Service: General;  Laterality: Left;   SENTINEL NODE BIOPSY Right 06/23/2021   Procedure: SENTINEL LYMPH NODE BIOPSY;  Surgeon: Stark Klein, MD;  Location: [place];  Service: General;  Laterality: Right;  SIMPLE MASTECTOMY WITH AXILLARY SENTINEL NODE BIOPSY Right 06/23/2021   Procedure: RIGHT MASTECTOMY;  Surgeon: Stark Klein, MD;  Location: [place];  Service: General;  Laterality: Right;   TONSILLECTOMY AND ADENOIDECTOMY     TOTAL KNEE ARTHROPLASTY Left 08/23/2015   Procedure: TOTAL KNEE ARTHROPLASTY;  Surgeon: Gaynelle Arabian, MD;  [facility];  Service: Orthopedics;  Laterality: Left;   VAGINAL HYSTERECTOMY  20 yrs. ago   with BSO    SOCIAL HISTORY: Social History   Socioeconomic History   Marital status: Divorced    Spouse name: Not on file   Number of children: Not on file   Years of education: Not on file   Highest education level: Not on file  Occupational History   Occupation: retired Marine scientist  Tobacco Use   Smoking status: Never   Smokeless tobacco: Never  Vaping Use   Vaping Use: Never used  Substance and Sexual Activity   Alcohol use: Not Currently    Comment: occasionally - few times a year    Drug use: Never    Sexual activity: Not Currently    Birth control/protection: Surgical  Other Topics Concern   Not on file  Social History Narrative   Retired Sports coach, 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 who is a Marine scientist   Social Determinants of Radio broadcast assistant Strain: Not on file  Food Insecurity: Not on file  Transportation Needs: Not on file  Physical Activity: Not on file  Stress: Not on file  Social Connections: Not on file  Intimate Partner Violence: Not on file    FAMILY HISTORY: Family History  Problem Relation Age of Onset   Congestive Heart Failure Father        Died at 88   Hypertension Father    Heart disease Father    Stroke Father    Ovarian cancer Mother 78       deceased 19   Uterine cancer Maternal Grandmother        late 38s   Liver cancer Paternal Grandfather 53       deceased 58s   Thyroid cancer Sister 106       currently 74   Colon cancer Neg Hx    Stomach cancer Neg Hx    Esophageal cancer Neg Hx    Rectal cancer Neg Hx    Breast cancer Neg Hx    Colon polyps Neg Hx     Review of Systems - Oncology    PHYSICAL EXAMINATION  ECOG PERFORMANCE STATUS: {CHL ONC ECOG PS:[phone]}  There were no vitals filed for this visit.  Physical Exam  LABORATORY DATA:  CBC    Component Value Date/Time   WBC 6.3 10/25/2021 1132   WBC 8.0 05/11/2021 1149   RBC 4.92 10/25/2021 1132   HGB 14.7 10/25/2021 1132   HGB 14.9 05/05/2016 1552   HCT 44.3 10/25/2021 1132   HCT 44.6 05/05/2016 1552   PLT 259 10/25/2021 1132   PLT 260 05/05/2016 1552   MCV 90.0 10/25/2021 1132   MCV 88.3 05/05/2016 1552   MCH 29.9 10/25/2021 1132   MCHC 33.2 10/25/2021 1132   RDW 14.6 10/25/2021 1132   RDW 13.6 05/05/2016 1552   LYMPHSABS 0.9 10/25/2021 1132   LYMPHSABS 1.4 05/05/2016 1552   MONOABS 0.9 10/25/2021 1132   MONOABS 0.7 05/05/2016 1552   EOSABS 0.4 10/25/2021 1132   EOSABS 0.4 05/05/2016 1552   BASOSABS 0.1 10/25/2021 1132   BASOSABS 0.0 05/05/2016 1552  CMP      Component Value Date/Time   NA 136 10/25/2021 1132   NA 140 05/05/2016 1552   K 3.6 10/25/2021 1132   K 3.5 05/05/2016 1552   CL 101 10/25/2021 1132   CL 102 09/23/2012 1028   CO2 29 10/25/2021 1132   CO2 25 05/05/2016 1552   GLUCOSE 90 10/25/2021 1132   GLUCOSE 115 05/05/2016 1552   GLUCOSE 103 (H) 09/23/2012 1028   BUN 18 10/25/2021 1132   BUN 17.5 05/05/2016 1552   CREATININE 0.81 10/25/2021 1132   CREATININE 0.8 05/05/2016 1552   CALCIUM 9.7 10/25/2021 1132   CALCIUM 10.0 05/05/2016 1552   PROT 6.9 10/25/2021 1132   PROT 7.8 05/05/2016 1552   ALBUMIN 4.1 10/25/2021 1132   ALBUMIN 4.0 05/05/2016 1552   AST 19 10/25/2021 1132   AST 20 05/05/2016 1552   ALT 19 10/25/2021 1132   ALT 23 05/05/2016 1552   ALKPHOS 75 10/25/2021 1132   ALKPHOS 113 05/05/2016 1552   BILITOT 0.4 10/25/2021 1132   BILITOT 0.32 05/05/2016 1552   GFRNONAA >60 10/25/2021 1132   GFRAA >60 06/11/2019 1039       PENDING LABS:   RADIOGRAPHIC STUDIES:  No results found.   PATHOLOGY:     ASSESSMENT and THERAPY PLAN:   No problem-specific Assessment & Plan notes found for this encounter.   No orders of the defined types were placed in this encounter.   All questions were answered. The patient knows to call the clinic with any problems, questions or concerns. We can certainly see the patient much sooner if necessary. This note was electronically signed. Scot Dock, NP 11/15/2021

## 2021-11-16 ENCOUNTER — Encounter: Payer: Self-pay | Admitting: Hematology and Oncology

## 2021-11-17 ENCOUNTER — Encounter: Payer: Self-pay | Admitting: Hematology and Oncology

## 2021-11-17 ENCOUNTER — Other Ambulatory Visit: Payer: Self-pay

## 2021-11-17 ENCOUNTER — Encounter: Payer: Self-pay | Admitting: Adult Health

## 2021-11-17 NOTE — Assessment & Plan Note (Signed)
Ann Maxwell is a 81 year old woman with stage IIIA right triple negative breast cancer s/p mastectomy, and here today to receive her final cycle of adjuvant chemotherapy with CMF.    Ann Maxwell is tolerating her treatment well.  We reviewed her labs today in detail.  She will proceed with Treatment.   After this chemotherapy, she will proceed with adjuvant radiation therapy, followed by an office visit with Dr. Lindi Adie, then a SCP visit with me a few months later.  [place] verbalized understanding and agreement with the plan.

## 2021-11-21 ENCOUNTER — Other Ambulatory Visit: Payer: Self-pay | Admitting: General Surgery

## 2021-11-25 ENCOUNTER — Encounter: Payer: Self-pay | Admitting: *Deleted

## 2021-11-28 ENCOUNTER — Other Ambulatory Visit: Payer: Self-pay

## 2021-11-28 ENCOUNTER — Ambulatory Visit
Admission: RE | Admit: 2021-11-28 | Discharge: 2021-11-28 | Disposition: A | Discharge status: Home / Self Care | Payer: Medicare PPO | Source: Ambulatory Visit | Attending: Radiation Oncology | Admitting: Radiation Oncology

## 2021-11-28 DIAGNOSIS — C50111 Malignant neoplasm of central portion of right female breast: Secondary | ICD-10-CM | POA: Diagnosis not present

## 2021-11-28 DIAGNOSIS — Z171 Estrogen receptor negative status [ER-]: Secondary | ICD-10-CM | POA: Insufficient documentation

## 2021-11-28 DIAGNOSIS — Z510 Encounter for antineoplastic radiation therapy: Secondary | ICD-10-CM | POA: Diagnosis not present

## 2021-11-29 ENCOUNTER — Other Ambulatory Visit: Payer: Self-pay

## 2021-11-29 ENCOUNTER — Encounter (HOSPITAL_COMMUNITY): Payer: Self-pay | Admitting: General Surgery

## 2021-11-29 NOTE — Progress Notes (Signed)
Spoke with pt for pre-op call. Pt denies cardiac history. Pt is treated for HTN. Pt is not diabetic. Hx of asthma.  Shower instructions given to pt, she has some CHG soap at home and will use that morning of surgery.

## 2021-12-01 ENCOUNTER — Encounter (HOSPITAL_COMMUNITY): Payer: Self-pay | Admitting: General Surgery

## 2021-12-01 ENCOUNTER — Other Ambulatory Visit: Payer: Self-pay

## 2021-12-01 ENCOUNTER — Ambulatory Visit (HOSPITAL_BASED_OUTPATIENT_CLINIC_OR_DEPARTMENT_OTHER): Payer: Medicare PPO | Admitting: Certified Registered"

## 2021-12-01 ENCOUNTER — Encounter (HOSPITAL_COMMUNITY)
Admission: RE | Disposition: A | Discharge status: Home / Self Care | Payer: Self-pay | Source: Home / Self Care | Attending: General Surgery

## 2021-12-01 ENCOUNTER — Ambulatory Visit (HOSPITAL_COMMUNITY)
Admission: RE | Admit: 2021-12-01 | Discharge: 2021-12-01 | Disposition: A | Discharge status: Home / Self Care | Payer: Medicare PPO | Attending: General Surgery | Admitting: General Surgery

## 2021-12-01 ENCOUNTER — Ambulatory Visit (HOSPITAL_COMMUNITY): Payer: Medicare PPO | Admitting: Certified Registered"

## 2021-12-01 DIAGNOSIS — E669 Obesity, unspecified: Secondary | ICD-10-CM | POA: Insufficient documentation

## 2021-12-01 DIAGNOSIS — Z171 Estrogen receptor negative status [ER-]: Secondary | ICD-10-CM | POA: Insufficient documentation

## 2021-12-01 DIAGNOSIS — J45909 Unspecified asthma, uncomplicated: Secondary | ICD-10-CM | POA: Insufficient documentation

## 2021-12-01 DIAGNOSIS — I10 Essential (primary) hypertension: Secondary | ICD-10-CM | POA: Insufficient documentation

## 2021-12-01 DIAGNOSIS — Z452 Encounter for adjustment and management of vascular access device: Secondary | ICD-10-CM | POA: Diagnosis not present

## 2021-12-01 DIAGNOSIS — Z6835 Body mass index (BMI) 35.0-35.9, adult: Secondary | ICD-10-CM | POA: Insufficient documentation

## 2021-12-01 DIAGNOSIS — Z9013 Acquired absence of bilateral breasts and nipples: Secondary | ICD-10-CM | POA: Diagnosis not present

## 2021-12-01 DIAGNOSIS — F418 Other specified anxiety disorders: Secondary | ICD-10-CM | POA: Diagnosis not present

## 2021-12-01 DIAGNOSIS — D649 Anemia, unspecified: Secondary | ICD-10-CM

## 2021-12-01 DIAGNOSIS — C50911 Malignant neoplasm of unspecified site of right female breast: Secondary | ICD-10-CM | POA: Insufficient documentation

## 2021-12-01 HISTORY — PX: PORT-A-CATH REMOVAL: SHX5289

## 2021-12-01 SURGERY — REMOVAL PORT-A-CATH
Anesthesia: Monitor Anesthesia Care | Site: Chest | Laterality: Left

## 2021-12-01 MED ORDER — CEFAZOLIN SODIUM-DEXTROSE 2-4 GM/100ML-% IV SOLN
2.0000 g | INTRAVENOUS | Status: AC
  Administered 2021-12-01: 2 g via INTRAVENOUS
  Filled 2021-12-01: qty 100

## 2021-12-01 MED ORDER — ORAL CARE MOUTH RINSE: 15.0000 mL | Freq: Once | OROMUCOSAL | Status: AC

## 2021-12-01 MED ORDER — 0.9 % SODIUM CHLORIDE (POUR BTL) OPTIME
TOPICAL | Status: DC | PRN
  Administered 2021-12-01: 1000 mL

## 2021-12-01 MED ORDER — CHLORHEXIDINE GLUCONATE 0.12 % MT SOLN
15.0000 mL | Freq: Once | OROMUCOSAL | Status: AC
  Administered 2021-12-01: 15 mL via OROMUCOSAL
  Filled 2021-12-01: qty 15

## 2021-12-01 MED ORDER — ACETAMINOPHEN 500 MG PO TABS
1000.0000 mg | ORAL_TABLET | ORAL | Status: AC
  Administered 2021-12-01: 1000 mg via ORAL
  Filled 2021-12-01: qty 2

## 2021-12-01 MED ORDER — FENTANYL CITRATE (PF) 100 MCG/2ML IJ SOLN: 25.0000 ug | INTRAMUSCULAR | Status: DC | PRN

## 2021-12-01 MED ORDER — LIDOCAINE HCL 1 % IJ SOLN
INTRAMUSCULAR | Status: DC | PRN
  Administered 2021-12-01: 14 mL

## 2021-12-01 MED ORDER — CHLORHEXIDINE GLUCONATE CLOTH 2 % EX PADS: 6.0000 | MEDICATED_PAD | Freq: Once | CUTANEOUS | Status: DC

## 2021-12-01 MED ORDER — LIDOCAINE HCL 1 % IJ SOLN
INTRAMUSCULAR | Status: AC
Start: 2021-12-01 — End: ?
  Filled 2021-12-01: qty 20

## 2021-12-01 MED ORDER — BUPIVACAINE-EPINEPHRINE (PF) 0.25% -1:200000 IJ SOLN
INTRAMUSCULAR | Status: AC
  Filled 2021-12-01: qty 30

## 2021-12-01 MED ORDER — PROPOFOL 500 MG/50ML IV EMUL
INTRAVENOUS | Status: DC | PRN
  Administered 2021-12-01: 100 ug/kg/min via INTRAVENOUS

## 2021-12-01 MED ORDER — LACTATED RINGERS IV SOLN: INTRAVENOUS | Status: DC

## 2021-12-01 SURGICAL SUPPLY — 32 items
ADH SKN CLS APL DERMABOND .7 (GAUZE/BANDAGES/DRESSINGS) ×1
BAG COUNTER SPONGE SURGICOUNT (BAG) ×2 IMPLANT
BAG SPNG CNTER NS LX DISP (BAG) ×1
CHLORAPREP W/TINT 10.5 ML (MISCELLANEOUS) ×2 IMPLANT
COVER SURGICAL LIGHT HANDLE (MISCELLANEOUS) ×2 IMPLANT
DERMABOND ADVANCED (GAUZE/BANDAGES/DRESSINGS) ×1
DERMABOND ADVANCED .7 DNX12 (GAUZE/BANDAGES/DRESSINGS) ×2 IMPLANT
DRAPE LAPAROTOMY 100X72 PEDS (DRAPES) ×2 IMPLANT
ELECT CAUTERY BLADE 6.4 (BLADE) ×2 IMPLANT
ELECT REM PT RETURN 9FT ADLT (ELECTROSURGICAL) ×1
ELECTRODE REM PT RTRN 9FT ADLT (ELECTROSURGICAL) ×2 IMPLANT
GAUZE 4X4 16PLY ~~LOC~~+RFID DBL (SPONGE) ×2 IMPLANT
GLOVE BIO SURGEON STRL SZ 6 (GLOVE) ×2 IMPLANT
GLOVE BIO SURGEON STRL SZ7 (GLOVE) IMPLANT
GLOVE INDICATOR 6.5 STRL GRN (GLOVE) ×2 IMPLANT
GOWN STRL REUS W/ TWL LRG LVL3 (GOWN DISPOSABLE) ×2 IMPLANT
GOWN STRL REUS W/TWL 2XL LVL3 (GOWN DISPOSABLE) ×2 IMPLANT
GOWN STRL REUS W/TWL LRG LVL3 (GOWN DISPOSABLE) ×1
KIT BASIN OR (CUSTOM PROCEDURE TRAY) ×2 IMPLANT
KIT TURNOVER KIT B (KITS) ×2 IMPLANT
NDL HYPO 25GX1X1/2 BEV (NEEDLE) ×2 IMPLANT
NEEDLE HYPO 25GX1X1/2 BEV (NEEDLE) ×1 IMPLANT
NS IRRIG 1000ML POUR BTL (IV SOLUTION) ×2 IMPLANT
PACK GENERAL/GYN (CUSTOM PROCEDURE TRAY) ×2 IMPLANT
PAD ARMBOARD 7.5X6 YLW CONV (MISCELLANEOUS) ×4 IMPLANT
SPIKE FLUID TRANSFER (MISCELLANEOUS) ×4 IMPLANT
SUT MON AB 4-0 PC3 18 (SUTURE) ×2 IMPLANT
SUT VIC AB 3-0 SH 27 (SUTURE) ×1
SUT VIC AB 3-0 SH 27X BRD (SUTURE) ×2 IMPLANT
SYR CONTROL 10ML LL (SYRINGE) ×2 IMPLANT
TOWEL GREEN STERILE (TOWEL DISPOSABLE) ×2 IMPLANT
TOWEL GREEN STERILE FF (TOWEL DISPOSABLE) ×2 IMPLANT

## 2021-12-01 NOTE — Op Note (Signed)
  PRE-OPERATIVE DIAGNOSIS:  un-needed Port-A-Cath for right triple negative breast cancer  POST-OPERATIVE DIAGNOSIS:  Same   PROCEDURE:  Procedure(s):  REMOVAL PORT-A-CATH  SURGEON:  Surgeon(s):  Stark Klein, MD  ANESTHESIA:   MAC + local  EBL:   Minimal  SPECIMEN:  None  Complications : none known  Procedure:   Pt was  identified in the holding area and taken to the operating room where she was placed supine on the operating room table.  MAC anesthesia was induced.  The left upper chest was prepped and draped.  The prior incision was anesthetized with local anesthetic.  The incision was opened with a #15 blade.  The subcutaneous tissue was divided with the cautery.  The port was identified and the capsule opened.  The four 2-0 prolene sutures were removed.  The port was then removed and pressure held on the tract.  The catheter appeared intact without evidence of breakage, length was 28 cm.  The wound was inspected for hemostasis, which was achieved with cautery.  The wound was closed with 3-0 vicryl deep dermal interrupted sutures and 4-0 Monocryl running subcuticular suture.  The wound was cleaned, dried, and dressed with dermabond.  The patient was awakened from anesthesia and taken to the PACU in stable condition.  Needle, sponge, and instrument counts are correct.

## 2021-12-01 NOTE — Discharge Instructions (Addendum)
[facility] Office Phone Number [phone]   POST OP INSTRUCTIONS  Always review your discharge instruction sheet given to you by the facility where your surgery was performed.  IF YOU HAVE DISABILITY OR FAMILY LEAVE FORMS, YOU MUST BRING THEM TO THE OFFICE FOR PROCESSING.  DO NOT GIVE THEM TO YOUR DOCTOR.  Take 2 tylenol (acetominophen) three times a day for 3 days.  If you still have pain, add ibuprofen with food in between if able to take this (if you have kidney issues or stomach issues, do not take ibuprofen).  If both of those are not enough, add the narcotic pain pill.  If you find you are needing a lot of this overnight after surgery, call the next morning for a refill.   Take your usually prescribed medications unless otherwise directed If you need a refill on your pain medication, please contact your pharmacy.  They will contact our office to request authorization.  Prescriptions will not be filled after 5pm or on week-ends. You should eat very light the first 24 hours after surgery, such as soup, crackers, pudding, etc.  Resume your normal diet the day after surgery It is common to experience some constipation if taking pain medication after surgery.  Increasing fluid intake and taking a stool softener will usually help or prevent this problem from occurring.  A mild laxative (Milk of Magnesia or Miralax) should be taken according to package directions if there are no bowel movements after 48 hours. You may shower in 48 hours.  The surgical glue will flake off in 2-3 weeks.   ACTIVITIES:  No strenuous activity or heavy lifting for 1 week.   You may drive when you no longer are taking prescription pain medication, you can comfortably wear a seatbelt, and you can safely maneuver your car and apply brakes. RETURN TO WORK:  __________n/a_______________ You should see your doctor in the office for a follow-up appointment approximately three-four weeks after your surgery.     WHEN TO CALL YOUR DOCTOR: Fever over 101.0 Nausea and/or vomiting. Extreme swelling or bruising. Continued bleeding from incision. Increased pain, redness, or drainage from the incision.  The clinic staff is available to answer your questions during regular business hours.  Please don't hesitate to call and ask to speak to one of the nurses for clinical concerns.  If you have a medical emergency, go to the nearest emergency room or call 911.  A surgeon from [facility] is always on call at the hospital.  For further questions, please visit [email or web address]   

## 2021-12-01 NOTE — H&P (Signed)
Ann Maxwell is an 81 y.o. female.   Chief Complaint: right breast cancer HPI:  Pt is a 81 yo F s/p right mastectomy for breast cancer that was triple negative.  She required post op chemo.  She is ready for port removal.    Past Medical History:  Diagnosis Date   Allergy    Anemia    iron infusion -last done 1 month ago(12-15, and 12-22 -58 [facility])- Dr. Lindi Adie   Anxiety    Asthma    Breast CA [facility]) 2013   a. L breast DCIS, s/p L mastectomy 12/2011. no further issues   Breast cancer ([place]) 2023   Right breast IDC   Cataract    bilat removed    Colon cancer ([place])    Depression    pt denies any hx of depression    Environmental allergies    History of kidney stones    Hypertension    controlled on meds    Internal hemorrhoids    remains an issue- no bright bleeding seen recent   Iron deficiency anemia    Obesity    Osteoarthritis    arthritis- knees and shoulders   Osteoporosis    drug induced and on Fosamax   Transfusion history    2 yrs ago due to anemia   Vertigo    no recent issues    Past Surgical History:  Procedure Laterality Date   BREAST BIOPSY Right 05/27/2021   U/S Bx   BREAST SURGERY     CATARACT EXTRACTION, BILATERAL  2019   CHOLECYSTECTOMY     COLON RESECTION N/A 04/28/2014   Procedure: LAPAROSCOPIC  RIGHT HEMI-COLECTOMY;  Surgeon: Stark Klein, MD;  [facility];  Service: General;  Laterality: N/A;   COLONOSCOPY     Lithectomy  2010   LITHOTRIPSY     MASTECTOMY Left 2013   MASTECTOMY W/ SENTINEL NODE BIOPSY  01/04/2012   Procedure: MASTECTOMY WITH SENTINEL LYMPH NODE BIOPSY;  Surgeon: Stark Klein, MD;  Location: [place];  Service: General;  Laterality: Left;   ORIF [place]     wtih subsequent pin removal in 1997   ORIF ULNAR / RADIAL SHAFT FRACTURE Left    POLYPECTOMY     PORTACATH PLACEMENT Left 06/23/2021   Procedure: INSERTION PORT-A-CATH;  Surgeon: Stark Klein, MD;  Location: [place];  Service: General;   Laterality: Left;   SENTINEL NODE BIOPSY Right 06/23/2021   Procedure: SENTINEL LYMPH NODE BIOPSY;  Surgeon: Stark Klein, MD;  Location: [place];  Service: General;  Laterality: Right;   SIMPLE MASTECTOMY WITH AXILLARY SENTINEL NODE BIOPSY Right 06/23/2021   Procedure: RIGHT MASTECTOMY;  Surgeon: Stark Klein, MD;  Location: [place];  Service: General;  Laterality: Right;   TONSILLECTOMY AND ADENOIDECTOMY     TOTAL KNEE ARTHROPLASTY Left 08/23/2015   Procedure: TOTAL KNEE ARTHROPLASTY;  Surgeon: Gaynelle Arabian, MD;  [facility];  Service: Orthopedics;  Laterality: Left;   VAGINAL HYSTERECTOMY  20 yrs. ago   with BSO    Family History  Problem Relation Age of Onset   Congestive Heart Failure Father        Died at 98   Hypertension Father    Heart disease Father    Stroke Father    Ovarian cancer Mother 83       deceased 60   Uterine cancer Maternal Grandmother        late 60s   Liver  cancer Paternal Grandfather 65       deceased 38s   Thyroid cancer Sister 68       currently 26   Colon cancer Neg Hx    Stomach cancer Neg Hx    Esophageal cancer Neg Hx    Rectal cancer Neg Hx    Breast cancer Neg Hx    Colon polyps Neg Hx    Social History:  reports that she has never smoked. She has been exposed to tobacco smoke. She has never used smokeless tobacco. She reports that she does not currently use alcohol. She reports that she does not use drugs.  Allergies:  Allergies  Allergen Reactions   Other Shortness Of Breath and Other (See Comments)    Tree and shrub pollen    Pollen Extract Other (See Comments)   Tape Other (See Comments) and Rash    Surgical tape causes blisters   Tetracycline Other (See Comments)   Tyloxapol Nausea And Vomiting    Tylox    Terramycin [Oxytetracycline] Rash    Medications Prior to Admission  Medication Sig Dispense Refill   acetaminophen (TYLENOL) 650 MG CR tablet Take 650 mg by mouth every 8 (eight)  hours as needed for pain.     ADVAIR DISKUS 250-50 MCG/DOSE AEPB Inhale 1 puff into the lungs 2 (two) times daily. 180 each 0   alendronate (FOSAMAX) 70 MG tablet Take 1 tablet (70 mg total) by mouth once a week. Take with a full glass of water on an empty stomach. (Patient taking differently: Take 70 mg by mouth once a week. Take with a full glass of water on an empty stomach. On Saturday)     gabapentin (NEURONTIN) 100 MG capsule Take 1 capsule (100 mg total) by mouth 3 (three) times daily. (Patient taking differently: Take 100 mg by mouth at bedtime.)     hydrochlorothiazide (MICROZIDE) 12.5 MG capsule Take 12.5 mg by mouth daily.     hydrocortisone (ANUSOL-HC) 2.5 % rectal cream Place 1 application. rectally 2 (two) times daily. (Patient taking differently: Place 1 application  rectally 2 (two) times daily as needed for hemorrhoids or anal itching.) 30 g 1   losartan (COZAAR) 50 MG tablet Take 50 mg by mouth every evening.     melatonin 5 MG TABS Take 5 mg by mouth at bedtime as needed (sleep).     montelukast (SINGULAIR) 10 MG tablet Take 10 mg by mouth at bedtime.     polyvinyl alcohol (LIQUIFILM TEARS) 1.4 % ophthalmic solution Place 2 drops into both eyes as needed for dry eyes.     Vitamin D, Ergocalciferol, (DRISDOL) 1.25 MG (50000 UNIT) CAPS capsule Take 50,000 Units by mouth every 7 (seven) days. Tuesday     albuterol (VENTOLIN HFA) 108 (90 Base) MCG/ACT inhaler Inhale 2 puffs into the lungs every 6 (six) hours as needed for wheezing or shortness of breath.     ALPRAZolam (XANAX) 0.25 MG tablet Take 1 tablet (0.25 mg total) by mouth at bedtime as needed for anxiety. 30 tablet 0   celecoxib (CELEBREX) 200 MG capsule Take 200 mg by mouth daily as needed for mild pain.     loratadine (CLARITIN) 10 MG tablet Take 10 mg by mouth daily as needed for allergies.     ondansetron (ZOFRAN) 8 MG tablet Take 1 tablet (8 mg total) by mouth 2 (two) times daily as needed for refractory nausea / vomiting.  Start on day 3 after chemotherapy. 30 tablet 1  prochlorperazine (COMPAZINE) 10 MG tablet Take 1 tablet (10 mg total) by mouth every 6 (six) hours as needed (Nausea or vomiting). 30 tablet 1    No results found for this or any previous visit (from the past 48 hour(s)). No results found.  Review of Systems  All other systems reviewed and are negative.   Blood pressure (!) 154/72, pulse 74, temperature 97.9 F (36.6 C), temperature source Oral, resp. rate 18, height 5' 3.5" (1.613 m), weight 93.4 kg, SpO2 94 %. Physical Exam Vitals reviewed.  Constitutional:      General: She is not in acute distress.    Appearance: Normal appearance.  HENT:     Head: Normocephalic and atraumatic.     Nose: Nose normal.  Eyes:     Extraocular Movements: Extraocular movements intact.     Pupils: Pupils are equal, round, and reactive to light.  Cardiovascular:     Rate and Rhythm: Normal rate and regular rhythm.  Pulmonary:     Effort: Pulmonary effort is normal.     Comments: Left port Abdominal:     General: Abdomen is flat.  Musculoskeletal:        General: No swelling, tenderness, deformity or signs of injury.     Cervical back: Normal range of motion and neck supple.  Skin:    Capillary Refill: Capillary refill takes 2 to 3 seconds.  Neurological:     General: No focal deficit present.     Mental Status: She is alert.  Psychiatric:        Mood and Affect: Mood normal.        Behavior: Behavior normal.        Thought Content: Thought content normal.      Assessment/Plan Recent h/o right breast cancer Port in place  Plan port removal Reviewed procedure/risks.  Stark Klein, MD 12/01/2021, 10:02 AM

## 2021-12-01 NOTE — Anesthesia Postprocedure Evaluation (Signed)
Anesthesia Post Note  Patient: Ann Maxwell  Procedure(s) Performed: REMOVAL PORT-A-CATH (Left: Chest)     Patient location during evaluation: PACU Anesthesia Type: MAC Level of consciousness: awake and alert Pain management: pain level controlled Vital Signs Assessment: post-procedure vital signs reviewed and stable Respiratory status: spontaneous breathing, nonlabored ventilation, respiratory function stable and patient connected to nasal cannula oxygen Cardiovascular status: stable and blood pressure returned to baseline Postop Assessment: no apparent nausea or vomiting Anesthetic complications: no   No notable events documented.  Last Vitals:  Vitals:   12/01/21 1130 12/01/21 1140  BP: (!) 157/83 (!) 149/85  Pulse: 67 61  Resp: (!) 24 (!) 22  Temp:  36.6 C  SpO2: 96% 97%    Last Pain:  Vitals:   12/01/21 1140  TempSrc:   PainSc: 0-No pain                 Tiajuana Amass

## 2021-12-01 NOTE — Transfer of Care (Signed)
Immediate Anesthesia Transfer of Care Note  Patient: Ann Maxwell  Procedure(s) Performed: REMOVAL PORT-A-CATH (Left: Chest)  Patient Location: PACU  Anesthesia Type:MAC  Level of Consciousness: awake, alert , oriented and patient cooperative  Airway & Oxygen Therapy: Patient Spontanous Breathing and Patient connected to nasal cannula oxygen  Post-op Assessment: Report given to RN, Post -op Vital signs reviewed and stable and Patient moving all extremities  Post vital signs: Reviewed and stable  Last Vitals:  Vitals Value Taken Time  BP 144/88 12/01/21 1115  Temp 36.6 C 12/01/21 1109  Pulse 72 12/01/21 1116  Resp 15 12/01/21 1116  SpO2 96 % 12/01/21 1116  Vitals shown include unvalidated device data.  Last Pain:  Vitals:   12/01/21 1109  TempSrc:   PainSc: 0-No pain         Complications: No notable events documented.

## 2021-12-01 NOTE — Anesthesia Preprocedure Evaluation (Signed)
Anesthesia Evaluation  Patient identified by MRN, date of birth, ID band Patient awake    Reviewed: Allergy & Precautions, NPO status , Patient's Chart, lab work & pertinent test results  Airway Mallampati: III  TM Distance: >3 FB Neck ROM: Limited  Mouth opening: Limited Mouth Opening  Dental no notable dental hx.    Pulmonary asthma ,    Pulmonary exam normal breath sounds clear to auscultation       Cardiovascular Exercise Tolerance: Poor hypertension, Normal cardiovascular exam+ dysrhythmias (second degree AV block)  Rhythm:Regular Rate:Normal     Neuro/Psych PSYCHIATRIC DISORDERS Anxiety Depression negative neurological ROS     GI/Hepatic negative GI ROS, Neg liver ROS,   Endo/Other  obesity  Renal/GU negative Renal ROS  negative genitourinary   Musculoskeletal  (+) Arthritis , Osteoarthritis,  Back pain, shoulder bone spurs   Abdominal (+) + obese,   Peds negative pediatric ROS (+)  Hematology  (+) Blood dyscrasia, anemia ,   Anesthesia Other Findings Breast cancer  Reproductive/Obstetrics negative OB ROS                             Lab Results  Component Value Date   WBC 5.1 11/15/2021   HGB 13.9 11/15/2021   HCT 41.5 11/15/2021   MCV 90.8 11/15/2021   PLT 227 11/15/2021   Lab Results  Component Value Date   CREATININE 0.64 11/15/2021   BUN 12 11/15/2021   NA 138 11/15/2021   K 3.4 (L) 11/15/2021   CL 103 11/15/2021   CO2 30 11/15/2021    Anesthesia Physical  Anesthesia Plan  ASA: 3  Anesthesia Plan: MAC   Post-op Pain Management: Tylenol PO (pre-op)*   Induction:   PONV Risk Score and Plan: Treatment may vary due to age or medical condition, Ondansetron and Propofol infusion  Airway Management Planned: Natural Airway and Simple Face Mask  Additional Equipment:   Intra-op Plan:   Post-operative Plan:   Informed Consent: I have reviewed the patients  History and Physical, chart, labs and discussed the procedure including the risks, benefits and alternatives for the proposed anesthesia with the patient or authorized representative who has indicated his/her understanding and acceptance.       Plan Discussed with:   Anesthesia Plan Comments:         Anesthesia Quick Evaluation

## 2021-12-02 ENCOUNTER — Encounter (HOSPITAL_COMMUNITY): Payer: Self-pay | Admitting: General Surgery

## 2021-12-05 ENCOUNTER — Encounter: Payer: Self-pay | Admitting: *Deleted

## 2021-12-06 ENCOUNTER — Telehealth: Payer: Self-pay

## 2021-12-06 DIAGNOSIS — C50111 Malignant neoplasm of central portion of right female breast: Secondary | ICD-10-CM | POA: Diagnosis not present

## 2021-12-06 DIAGNOSIS — Z171 Estrogen receptor negative status [ER-]: Secondary | ICD-10-CM | POA: Diagnosis not present

## 2021-12-06 DIAGNOSIS — Z510 Encounter for antineoplastic radiation therapy: Secondary | ICD-10-CM | POA: Diagnosis not present

## 2021-12-06 NOTE — Telephone Encounter (Signed)
Returned patient's call regarding questions related to receiving updated covid and flu vaccinations. Patient advised that provider will discuss this with her at her next appointment on September 25th. Patient verbalized an understanding of the information. All questions answered during call.

## 2021-12-08 ENCOUNTER — Other Ambulatory Visit: Payer: Self-pay

## 2021-12-08 ENCOUNTER — Ambulatory Visit
Admission: RE | Admit: 2021-12-08 | Discharge: 2021-12-08 | Disposition: A | Discharge status: Home / Self Care | Payer: Medicare PPO | Source: Ambulatory Visit | Attending: Radiation Oncology | Admitting: Radiation Oncology

## 2021-12-08 DIAGNOSIS — Z171 Estrogen receptor negative status [ER-]: Secondary | ICD-10-CM | POA: Diagnosis not present

## 2021-12-08 DIAGNOSIS — C50111 Malignant neoplasm of central portion of right female breast: Secondary | ICD-10-CM | POA: Diagnosis not present

## 2021-12-08 DIAGNOSIS — Z510 Encounter for antineoplastic radiation therapy: Secondary | ICD-10-CM | POA: Diagnosis not present

## 2021-12-08 LAB — RAD ONC ARIA SESSION SUMMARY

## 2021-12-09 ENCOUNTER — Other Ambulatory Visit: Payer: Self-pay

## 2021-12-09 ENCOUNTER — Ambulatory Visit
Admission: RE | Admit: 2021-12-09 | Discharge: 2021-12-09 | Disposition: A | Discharge status: Home / Self Care | Payer: Medicare PPO | Source: Ambulatory Visit | Attending: Radiation Oncology | Admitting: Radiation Oncology

## 2021-12-09 DIAGNOSIS — C50111 Malignant neoplasm of central portion of right female breast: Secondary | ICD-10-CM | POA: Diagnosis not present

## 2021-12-09 DIAGNOSIS — Z510 Encounter for antineoplastic radiation therapy: Secondary | ICD-10-CM | POA: Diagnosis not present

## 2021-12-09 DIAGNOSIS — Z171 Estrogen receptor negative status [ER-]: Secondary | ICD-10-CM | POA: Diagnosis not present

## 2021-12-09 LAB — RAD ONC ARIA SESSION SUMMARY
Course Elapsed Days: 1
Plan Fractions Treated to Date: 1
Plan Fractions Treated to Date: 1
Plan Prescribed Dose Per Fraction: 2 Gy
Plan Prescribed Dose Per Fraction: 2 Gy
Plan Total Fractions Prescribed: 12
Plan Total Fractions Prescribed: 24
Plan Total Prescribed Dose: 24 Gy
Plan Total Prescribed Dose: 48 Gy
Reference Point Dosage Given to Date: 4 Gy
Reference Point Dosage Given to Date: 4 Gy
Reference Point Session Dosage Given: 2 Gy
Reference Point Session Dosage Given: 2 Gy
Session Number: 2

## 2021-12-12 ENCOUNTER — Other Ambulatory Visit: Payer: Self-pay

## 2021-12-12 ENCOUNTER — Ambulatory Visit
Admission: RE | Admit: 2021-12-12 | Discharge: 2021-12-12 | Disposition: A | Discharge status: Home / Self Care | Payer: Medicare PPO | Source: Ambulatory Visit | Attending: Radiation Oncology | Admitting: Radiation Oncology

## 2021-12-12 DIAGNOSIS — Z510 Encounter for antineoplastic radiation therapy: Secondary | ICD-10-CM | POA: Diagnosis not present

## 2021-12-12 DIAGNOSIS — Z171 Estrogen receptor negative status [ER-]: Secondary | ICD-10-CM | POA: Diagnosis not present

## 2021-12-12 DIAGNOSIS — C50111 Malignant neoplasm of central portion of right female breast: Secondary | ICD-10-CM | POA: Diagnosis not present

## 2021-12-12 LAB — RAD ONC ARIA SESSION SUMMARY
Course Elapsed Days: 4
Plan Fractions Treated to Date: 2
Plan Fractions Treated to Date: 2
Plan Prescribed Dose Per Fraction: 2 Gy
Plan Prescribed Dose Per Fraction: 2 Gy
Plan Total Fractions Prescribed: 13
Plan Total Fractions Prescribed: 24
Plan Total Prescribed Dose: 26 Gy
Plan Total Prescribed Dose: 48 Gy
Reference Point Dosage Given to Date: 6 Gy
Reference Point Dosage Given to Date: 6 Gy
Reference Point Session Dosage Given: 2 Gy
Reference Point Session Dosage Given: 2 Gy
Session Number: 3

## 2021-12-12 MED ORDER — RADIAPLEXRX EX GEL: Freq: Once | CUTANEOUS | Status: AC

## 2021-12-12 MED ORDER — ALRA NON-METALLIC DEODORANT (RAD-ONC)
1.0000 | Freq: Once | TOPICAL | Status: AC
  Administered 2021-12-12: 1 via TOPICAL

## 2021-12-12 NOTE — Progress Notes (Signed)
Pt here for patient teaching. Pt given Radiation and You booklet, skin care instructions, Alra deodorant, and Radiaplex gel. Reviewed areas of pertinence such as fatigue, hair loss, nausea and vomiting, skin changes, breast tenderness, and breast swelling. Pt able to give teach back of to pat skin, use unscented/gentle soap, and drink plenty of water, apply Radiaplex bid, avoid applying anything to skin within 4 hours of treatment, avoid wearing an under wire bra, and to use an electric razor if they must shave. Pt verbalizes understanding of information given and will contact nursing with any questions or concerns.     [email or web address]      

## 2021-12-13 ENCOUNTER — Other Ambulatory Visit: Payer: Self-pay

## 2021-12-13 ENCOUNTER — Ambulatory Visit
Admission: RE | Admit: 2021-12-13 | Discharge: 2021-12-13 | Disposition: A | Discharge status: Home / Self Care | Payer: Medicare PPO | Source: Ambulatory Visit | Attending: Radiation Oncology | Admitting: Radiation Oncology

## 2021-12-13 DIAGNOSIS — Z171 Estrogen receptor negative status [ER-]: Secondary | ICD-10-CM | POA: Diagnosis not present

## 2021-12-13 DIAGNOSIS — C50111 Malignant neoplasm of central portion of right female breast: Secondary | ICD-10-CM | POA: Diagnosis not present

## 2021-12-13 DIAGNOSIS — Z510 Encounter for antineoplastic radiation therapy: Secondary | ICD-10-CM | POA: Diagnosis not present

## 2021-12-13 LAB — RAD ONC ARIA SESSION SUMMARY
Course Elapsed Days: 5
Plan Fractions Treated to Date: 2
Plan Fractions Treated to Date: 3
Plan Prescribed Dose Per Fraction: 2 Gy
Plan Prescribed Dose Per Fraction: 2 Gy
Plan Total Fractions Prescribed: 12
Plan Total Fractions Prescribed: 24
Plan Total Prescribed Dose: 24 Gy
Plan Total Prescribed Dose: 48 Gy
Reference Point Dosage Given to Date: 8 Gy
Reference Point Dosage Given to Date: 8 Gy
Reference Point Session Dosage Given: 2 Gy
Reference Point Session Dosage Given: 2 Gy
Session Number: 4

## 2021-12-14 ENCOUNTER — Ambulatory Visit
Admission: RE | Admit: 2021-12-14 | Discharge: 2021-12-14 | Disposition: A | Discharge status: Home / Self Care | Payer: Medicare PPO | Source: Ambulatory Visit | Attending: Radiation Oncology | Admitting: Radiation Oncology

## 2021-12-14 ENCOUNTER — Other Ambulatory Visit: Payer: Self-pay

## 2021-12-14 DIAGNOSIS — Z171 Estrogen receptor negative status [ER-]: Secondary | ICD-10-CM | POA: Diagnosis not present

## 2021-12-14 DIAGNOSIS — C50111 Malignant neoplasm of central portion of right female breast: Secondary | ICD-10-CM | POA: Diagnosis not present

## 2021-12-14 DIAGNOSIS — Z510 Encounter for antineoplastic radiation therapy: Secondary | ICD-10-CM | POA: Diagnosis not present

## 2021-12-14 LAB — RAD ONC ARIA SESSION SUMMARY
Course Elapsed Days: 6
Plan Fractions Treated to Date: 3
Plan Fractions Treated to Date: 4
Plan Prescribed Dose Per Fraction: 2 Gy
Plan Prescribed Dose Per Fraction: 2 Gy
Plan Total Fractions Prescribed: 13
Plan Total Fractions Prescribed: 24
Plan Total Prescribed Dose: 26 Gy
Plan Total Prescribed Dose: 48 Gy
Reference Point Dosage Given to Date: 10 Gy
Reference Point Dosage Given to Date: 10 Gy
Reference Point Session Dosage Given: 2 Gy
Reference Point Session Dosage Given: 2 Gy
Session Number: 5

## 2021-12-15 ENCOUNTER — Ambulatory Visit
Admission: RE | Admit: 2021-12-15 | Discharge: 2021-12-15 | Disposition: A | Discharge status: Home / Self Care | Payer: Medicare PPO | Source: Ambulatory Visit | Attending: Radiation Oncology | Admitting: Radiation Oncology

## 2021-12-15 ENCOUNTER — Other Ambulatory Visit: Payer: Self-pay

## 2021-12-15 DIAGNOSIS — M1711 Unilateral primary osteoarthritis, right knee: Secondary | ICD-10-CM | POA: Diagnosis not present

## 2021-12-15 DIAGNOSIS — Z171 Estrogen receptor negative status [ER-]: Secondary | ICD-10-CM | POA: Diagnosis not present

## 2021-12-15 DIAGNOSIS — Z510 Encounter for antineoplastic radiation therapy: Secondary | ICD-10-CM | POA: Diagnosis not present

## 2021-12-15 DIAGNOSIS — C50111 Malignant neoplasm of central portion of right female breast: Secondary | ICD-10-CM | POA: Diagnosis not present

## 2021-12-15 LAB — RAD ONC ARIA SESSION SUMMARY
Course Elapsed Days: 7
Plan Fractions Treated to Date: 3
Plan Fractions Treated to Date: 5
Plan Prescribed Dose Per Fraction: 2 Gy
Plan Prescribed Dose Per Fraction: 2 Gy
Plan Total Fractions Prescribed: 12
Plan Total Fractions Prescribed: 24
Plan Total Prescribed Dose: 24 Gy
Plan Total Prescribed Dose: 48 Gy
Reference Point Dosage Given to Date: 12 Gy
Reference Point Dosage Given to Date: 12 Gy
Reference Point Session Dosage Given: 2 Gy
Reference Point Session Dosage Given: 2 Gy
Session Number: 6

## 2021-12-16 ENCOUNTER — Ambulatory Visit
Admission: RE | Admit: 2021-12-16 | Discharge: 2021-12-16 | Disposition: A | Discharge status: Home / Self Care | Payer: Medicare PPO | Source: Ambulatory Visit | Attending: Radiation Oncology | Admitting: Radiation Oncology

## 2021-12-16 ENCOUNTER — Other Ambulatory Visit: Payer: Self-pay

## 2021-12-16 DIAGNOSIS — Z510 Encounter for antineoplastic radiation therapy: Secondary | ICD-10-CM | POA: Insufficient documentation

## 2021-12-16 DIAGNOSIS — R609 Edema, unspecified: Secondary | ICD-10-CM | POA: Insufficient documentation

## 2021-12-16 DIAGNOSIS — R600 Localized edema: Secondary | ICD-10-CM | POA: Insufficient documentation

## 2021-12-16 DIAGNOSIS — C50111 Malignant neoplasm of central portion of right female breast: Secondary | ICD-10-CM | POA: Diagnosis not present

## 2021-12-16 DIAGNOSIS — Z171 Estrogen receptor negative status [ER-]: Secondary | ICD-10-CM | POA: Diagnosis not present

## 2021-12-16 LAB — RAD ONC ARIA SESSION SUMMARY
Course Elapsed Days: 8
Plan Fractions Treated to Date: 4
Plan Fractions Treated to Date: 6
Plan Prescribed Dose Per Fraction: 2 Gy
Plan Prescribed Dose Per Fraction: 2 Gy
Plan Total Fractions Prescribed: 13
Plan Total Fractions Prescribed: 24
Plan Total Prescribed Dose: 26 Gy
Plan Total Prescribed Dose: 48 Gy
Reference Point Dosage Given to Date: 14 Gy
Reference Point Dosage Given to Date: 14 Gy
Reference Point Session Dosage Given: 2 Gy
Reference Point Session Dosage Given: 2 Gy
Session Number: 7

## 2021-12-20 ENCOUNTER — Ambulatory Visit: Payer: Medicare PPO

## 2021-12-20 ENCOUNTER — Other Ambulatory Visit: Payer: Self-pay

## 2021-12-20 ENCOUNTER — Ambulatory Visit
Admission: RE | Admit: 2021-12-20 | Discharge: 2021-12-20 | Disposition: A | Discharge status: Home / Self Care | Payer: Medicare PPO | Source: Ambulatory Visit | Attending: Radiation Oncology | Admitting: Radiation Oncology

## 2021-12-20 DIAGNOSIS — Z171 Estrogen receptor negative status [ER-]: Secondary | ICD-10-CM | POA: Diagnosis not present

## 2021-12-20 DIAGNOSIS — Z510 Encounter for antineoplastic radiation therapy: Secondary | ICD-10-CM | POA: Diagnosis not present

## 2021-12-20 DIAGNOSIS — R600 Localized edema: Secondary | ICD-10-CM | POA: Diagnosis not present

## 2021-12-20 DIAGNOSIS — C50111 Malignant neoplasm of central portion of right female breast: Secondary | ICD-10-CM | POA: Diagnosis not present

## 2021-12-20 LAB — RAD ONC ARIA SESSION SUMMARY
Course Elapsed Days: 12
Plan Fractions Treated to Date: 4
Plan Fractions Treated to Date: 7
Plan Prescribed Dose Per Fraction: 2 Gy
Plan Prescribed Dose Per Fraction: 2 Gy
Plan Total Fractions Prescribed: 12
Plan Total Fractions Prescribed: 24
Plan Total Prescribed Dose: 24 Gy
Plan Total Prescribed Dose: 48 Gy
Reference Point Dosage Given to Date: 16 Gy
Reference Point Dosage Given to Date: 16 Gy
Reference Point Session Dosage Given: 2 Gy
Reference Point Session Dosage Given: 2 Gy
Session Number: 8

## 2021-12-21 ENCOUNTER — Ambulatory Visit
Admission: RE | Admit: 2021-12-21 | Discharge: 2021-12-21 | Disposition: A | Discharge status: Home / Self Care | Payer: Medicare PPO | Source: Ambulatory Visit | Attending: Radiation Oncology | Admitting: Radiation Oncology

## 2021-12-21 ENCOUNTER — Other Ambulatory Visit: Payer: Self-pay

## 2021-12-21 DIAGNOSIS — R600 Localized edema: Secondary | ICD-10-CM | POA: Diagnosis not present

## 2021-12-21 DIAGNOSIS — Z510 Encounter for antineoplastic radiation therapy: Secondary | ICD-10-CM | POA: Diagnosis not present

## 2021-12-21 DIAGNOSIS — C50111 Malignant neoplasm of central portion of right female breast: Secondary | ICD-10-CM | POA: Diagnosis not present

## 2021-12-21 DIAGNOSIS — Z171 Estrogen receptor negative status [ER-]: Secondary | ICD-10-CM | POA: Diagnosis not present

## 2021-12-21 LAB — RAD ONC ARIA SESSION SUMMARY
Course Elapsed Days: 13
Plan Fractions Treated to Date: 5
Plan Fractions Treated to Date: 8
Plan Prescribed Dose Per Fraction: 2 Gy
Plan Prescribed Dose Per Fraction: 2 Gy
Plan Total Fractions Prescribed: 13
Plan Total Fractions Prescribed: 24
Plan Total Prescribed Dose: 26 Gy
Plan Total Prescribed Dose: 48 Gy
Reference Point Dosage Given to Date: 18 Gy
Reference Point Dosage Given to Date: 18 Gy
Reference Point Session Dosage Given: 2 Gy
Reference Point Session Dosage Given: 2 Gy
Session Number: 9

## 2021-12-22 ENCOUNTER — Ambulatory Visit
Admission: RE | Admit: 2021-12-22 | Discharge: 2021-12-22 | Disposition: A | Discharge status: Home / Self Care | Payer: Medicare PPO | Source: Ambulatory Visit | Attending: Radiation Oncology | Admitting: Radiation Oncology

## 2021-12-22 ENCOUNTER — Other Ambulatory Visit: Payer: Self-pay

## 2021-12-22 DIAGNOSIS — C50111 Malignant neoplasm of central portion of right female breast: Secondary | ICD-10-CM | POA: Diagnosis not present

## 2021-12-22 DIAGNOSIS — Z510 Encounter for antineoplastic radiation therapy: Secondary | ICD-10-CM | POA: Diagnosis not present

## 2021-12-22 DIAGNOSIS — Z171 Estrogen receptor negative status [ER-]: Secondary | ICD-10-CM | POA: Diagnosis not present

## 2021-12-22 DIAGNOSIS — R600 Localized edema: Secondary | ICD-10-CM | POA: Diagnosis not present

## 2021-12-22 LAB — RAD ONC ARIA SESSION SUMMARY
Course Elapsed Days: 14
Plan Fractions Treated to Date: 5
Plan Fractions Treated to Date: 9
Plan Prescribed Dose Per Fraction: 2 Gy
Plan Prescribed Dose Per Fraction: 2 Gy
Plan Total Fractions Prescribed: 12
Plan Total Fractions Prescribed: 24
Plan Total Prescribed Dose: 24 Gy
Plan Total Prescribed Dose: 48 Gy
Reference Point Dosage Given to Date: 20 Gy
Reference Point Dosage Given to Date: 20 Gy
Reference Point Session Dosage Given: 2 Gy
Reference Point Session Dosage Given: 2 Gy
Session Number: 10

## 2021-12-23 ENCOUNTER — Other Ambulatory Visit: Payer: Self-pay

## 2021-12-23 ENCOUNTER — Ambulatory Visit
Admission: RE | Admit: 2021-12-23 | Discharge: 2021-12-23 | Disposition: A | Discharge status: Home / Self Care | Payer: Medicare PPO | Source: Ambulatory Visit | Attending: Radiation Oncology | Admitting: Radiation Oncology

## 2021-12-23 DIAGNOSIS — Z171 Estrogen receptor negative status [ER-]: Secondary | ICD-10-CM | POA: Diagnosis not present

## 2021-12-23 DIAGNOSIS — Z510 Encounter for antineoplastic radiation therapy: Secondary | ICD-10-CM | POA: Diagnosis not present

## 2021-12-23 DIAGNOSIS — C50111 Malignant neoplasm of central portion of right female breast: Secondary | ICD-10-CM | POA: Diagnosis not present

## 2021-12-23 DIAGNOSIS — R600 Localized edema: Secondary | ICD-10-CM | POA: Diagnosis not present

## 2021-12-23 LAB — RAD ONC ARIA SESSION SUMMARY
Course Elapsed Days: 15
Plan Fractions Treated to Date: 10
Plan Fractions Treated to Date: 6
Plan Prescribed Dose Per Fraction: 2 Gy
Plan Prescribed Dose Per Fraction: 2 Gy
Plan Total Fractions Prescribed: 13
Plan Total Fractions Prescribed: 24
Plan Total Prescribed Dose: 26 Gy
Plan Total Prescribed Dose: 48 Gy
Reference Point Dosage Given to Date: 22 Gy
Reference Point Dosage Given to Date: 22 Gy
Reference Point Session Dosage Given: 2 Gy
Reference Point Session Dosage Given: 2 Gy
Session Number: 11

## 2021-12-26 ENCOUNTER — Ambulatory Visit
Admission: RE | Admit: 2021-12-26 | Discharge: 2021-12-26 | Disposition: A | Discharge status: Home / Self Care | Payer: Medicare PPO | Source: Ambulatory Visit | Attending: Radiation Oncology | Admitting: Radiation Oncology

## 2021-12-26 ENCOUNTER — Other Ambulatory Visit: Payer: Self-pay

## 2021-12-26 ENCOUNTER — Ambulatory Visit: Payer: Medicare PPO

## 2021-12-26 DIAGNOSIS — Z510 Encounter for antineoplastic radiation therapy: Secondary | ICD-10-CM | POA: Diagnosis not present

## 2021-12-26 DIAGNOSIS — C50111 Malignant neoplasm of central portion of right female breast: Secondary | ICD-10-CM | POA: Diagnosis not present

## 2021-12-26 DIAGNOSIS — Z171 Estrogen receptor negative status [ER-]: Secondary | ICD-10-CM | POA: Diagnosis not present

## 2021-12-26 DIAGNOSIS — R600 Localized edema: Secondary | ICD-10-CM | POA: Diagnosis not present

## 2021-12-26 LAB — RAD ONC ARIA SESSION SUMMARY
Course Elapsed Days: 18
Plan Fractions Treated to Date: 11
Plan Fractions Treated to Date: 6
Plan Prescribed Dose Per Fraction: 2 Gy
Plan Prescribed Dose Per Fraction: 2 Gy
Plan Total Fractions Prescribed: 12
Plan Total Fractions Prescribed: 24
Plan Total Prescribed Dose: 24 Gy
Plan Total Prescribed Dose: 48 Gy
Reference Point Dosage Given to Date: 24 Gy
Reference Point Dosage Given to Date: 24 Gy
Reference Point Session Dosage Given: 2 Gy
Reference Point Session Dosage Given: 2 Gy
Session Number: 12

## 2021-12-27 ENCOUNTER — Other Ambulatory Visit: Payer: Self-pay

## 2021-12-27 ENCOUNTER — Ambulatory Visit
Admission: RE | Admit: 2021-12-27 | Discharge: 2021-12-27 | Disposition: A | Discharge status: Home / Self Care | Payer: Medicare PPO | Source: Ambulatory Visit | Attending: Radiation Oncology | Admitting: Radiation Oncology

## 2021-12-27 DIAGNOSIS — Z510 Encounter for antineoplastic radiation therapy: Secondary | ICD-10-CM | POA: Diagnosis not present

## 2021-12-27 DIAGNOSIS — Z171 Estrogen receptor negative status [ER-]: Secondary | ICD-10-CM | POA: Diagnosis not present

## 2021-12-27 DIAGNOSIS — C50111 Malignant neoplasm of central portion of right female breast: Secondary | ICD-10-CM | POA: Diagnosis not present

## 2021-12-27 DIAGNOSIS — R600 Localized edema: Secondary | ICD-10-CM | POA: Diagnosis not present

## 2021-12-27 LAB — RAD ONC ARIA SESSION SUMMARY
Course Elapsed Days: 19
Plan Fractions Treated to Date: 12
Plan Fractions Treated to Date: 7
Plan Prescribed Dose Per Fraction: 2 Gy
Plan Prescribed Dose Per Fraction: 2 Gy
Plan Total Fractions Prescribed: 13
Plan Total Fractions Prescribed: 24
Plan Total Prescribed Dose: 26 Gy
Plan Total Prescribed Dose: 48 Gy
Reference Point Dosage Given to Date: 26 Gy
Reference Point Dosage Given to Date: 26 Gy
Reference Point Session Dosage Given: 2 Gy
Reference Point Session Dosage Given: 2 Gy
Session Number: 13

## 2021-12-28 ENCOUNTER — Other Ambulatory Visit: Payer: Self-pay

## 2021-12-28 ENCOUNTER — Ambulatory Visit
Admission: RE | Admit: 2021-12-28 | Discharge: 2021-12-28 | Disposition: A | Discharge status: Home / Self Care | Payer: Medicare PPO | Source: Ambulatory Visit | Attending: Radiation Oncology | Admitting: Radiation Oncology

## 2021-12-28 DIAGNOSIS — Z510 Encounter for antineoplastic radiation therapy: Secondary | ICD-10-CM | POA: Diagnosis not present

## 2021-12-28 DIAGNOSIS — R600 Localized edema: Secondary | ICD-10-CM | POA: Diagnosis not present

## 2021-12-28 DIAGNOSIS — Z171 Estrogen receptor negative status [ER-]: Secondary | ICD-10-CM | POA: Diagnosis not present

## 2021-12-28 DIAGNOSIS — C50111 Malignant neoplasm of central portion of right female breast: Secondary | ICD-10-CM | POA: Diagnosis not present

## 2021-12-28 LAB — RAD ONC ARIA SESSION SUMMARY
Course Elapsed Days: 20
Plan Fractions Treated to Date: 13
Plan Fractions Treated to Date: 7
Plan Prescribed Dose Per Fraction: 2 Gy
Plan Prescribed Dose Per Fraction: 2 Gy
Plan Total Fractions Prescribed: 12
Plan Total Fractions Prescribed: 24
Plan Total Prescribed Dose: 24 Gy
Plan Total Prescribed Dose: 48 Gy
Reference Point Dosage Given to Date: 28 Gy
Reference Point Dosage Given to Date: 28 Gy
Reference Point Session Dosage Given: 2 Gy
Reference Point Session Dosage Given: 2 Gy
Session Number: 14

## 2021-12-29 ENCOUNTER — Ambulatory Visit
Admission: RE | Admit: 2021-12-29 | Discharge: 2021-12-29 | Disposition: A | Discharge status: Home / Self Care | Payer: Medicare PPO | Source: Ambulatory Visit | Attending: Radiation Oncology | Admitting: Radiation Oncology

## 2021-12-29 ENCOUNTER — Other Ambulatory Visit: Payer: Self-pay

## 2021-12-29 DIAGNOSIS — Z171 Estrogen receptor negative status [ER-]: Secondary | ICD-10-CM | POA: Diagnosis not present

## 2021-12-29 DIAGNOSIS — Z510 Encounter for antineoplastic radiation therapy: Secondary | ICD-10-CM | POA: Diagnosis not present

## 2021-12-29 DIAGNOSIS — C50111 Malignant neoplasm of central portion of right female breast: Secondary | ICD-10-CM | POA: Diagnosis not present

## 2021-12-29 DIAGNOSIS — R600 Localized edema: Secondary | ICD-10-CM | POA: Diagnosis not present

## 2021-12-29 LAB — RAD ONC ARIA SESSION SUMMARY
Course Elapsed Days: 21
Plan Fractions Treated to Date: 14
Plan Fractions Treated to Date: 8
Plan Prescribed Dose Per Fraction: 2 Gy
Plan Prescribed Dose Per Fraction: 2 Gy
Plan Total Fractions Prescribed: 13
Plan Total Fractions Prescribed: 24
Plan Total Prescribed Dose: 26 Gy
Plan Total Prescribed Dose: 48 Gy
Reference Point Dosage Given to Date: 30 Gy
Reference Point Dosage Given to Date: 30 Gy
Reference Point Session Dosage Given: 2 Gy
Reference Point Session Dosage Given: 2 Gy
Session Number: 15

## 2021-12-30 ENCOUNTER — Other Ambulatory Visit: Payer: Self-pay

## 2021-12-30 ENCOUNTER — Ambulatory Visit
Admission: RE | Admit: 2021-12-30 | Discharge: 2021-12-30 | Disposition: A | Discharge status: Home / Self Care | Payer: Medicare PPO | Source: Ambulatory Visit | Attending: Radiation Oncology | Admitting: Radiation Oncology

## 2021-12-30 DIAGNOSIS — Z171 Estrogen receptor negative status [ER-]: Secondary | ICD-10-CM | POA: Diagnosis not present

## 2021-12-30 DIAGNOSIS — Z961 Presence of intraocular lens: Secondary | ICD-10-CM | POA: Diagnosis not present

## 2021-12-30 DIAGNOSIS — H35363 Drusen (degenerative) of macula, bilateral: Secondary | ICD-10-CM | POA: Diagnosis not present

## 2021-12-30 DIAGNOSIS — H52203 Unspecified astigmatism, bilateral: Secondary | ICD-10-CM | POA: Diagnosis not present

## 2021-12-30 DIAGNOSIS — Z510 Encounter for antineoplastic radiation therapy: Secondary | ICD-10-CM | POA: Diagnosis not present

## 2021-12-30 DIAGNOSIS — C50111 Malignant neoplasm of central portion of right female breast: Secondary | ICD-10-CM | POA: Diagnosis not present

## 2021-12-30 DIAGNOSIS — R600 Localized edema: Secondary | ICD-10-CM | POA: Diagnosis not present

## 2021-12-30 LAB — RAD ONC ARIA SESSION SUMMARY
Course Elapsed Days: 22
Plan Fractions Treated to Date: 15
Plan Fractions Treated to Date: 8
Plan Prescribed Dose Per Fraction: 2 Gy
Plan Prescribed Dose Per Fraction: 2 Gy
Plan Total Fractions Prescribed: 12
Plan Total Fractions Prescribed: 24
Plan Total Prescribed Dose: 24 Gy
Plan Total Prescribed Dose: 48 Gy
Reference Point Dosage Given to Date: 32 Gy
Reference Point Dosage Given to Date: 32 Gy
Reference Point Session Dosage Given: 2 Gy
Reference Point Session Dosage Given: 2 Gy
Session Number: 16

## 2022-01-02 ENCOUNTER — Other Ambulatory Visit: Payer: Self-pay

## 2022-01-02 ENCOUNTER — Ambulatory Visit
Admission: RE | Admit: 2022-01-02 | Discharge: 2022-01-02 | Disposition: A | Discharge status: Home / Self Care | Payer: Medicare PPO | Source: Ambulatory Visit | Attending: Radiation Oncology | Admitting: Radiation Oncology

## 2022-01-02 ENCOUNTER — Ambulatory Visit: Payer: Medicare PPO

## 2022-01-02 DIAGNOSIS — Z171 Estrogen receptor negative status [ER-]: Secondary | ICD-10-CM | POA: Diagnosis not present

## 2022-01-02 DIAGNOSIS — Z510 Encounter for antineoplastic radiation therapy: Secondary | ICD-10-CM | POA: Diagnosis not present

## 2022-01-02 DIAGNOSIS — C50111 Malignant neoplasm of central portion of right female breast: Secondary | ICD-10-CM | POA: Diagnosis not present

## 2022-01-02 DIAGNOSIS — R600 Localized edema: Secondary | ICD-10-CM | POA: Diagnosis not present

## 2022-01-02 LAB — RAD ONC ARIA SESSION SUMMARY
Course Elapsed Days: 25
Plan Fractions Treated to Date: 16
Plan Fractions Treated to Date: 9
Plan Prescribed Dose Per Fraction: 2 Gy
Plan Prescribed Dose Per Fraction: 2 Gy
Plan Total Fractions Prescribed: 13
Plan Total Fractions Prescribed: 24
Plan Total Prescribed Dose: 26 Gy
Plan Total Prescribed Dose: 48 Gy
Reference Point Dosage Given to Date: 34 Gy
Reference Point Dosage Given to Date: 34 Gy
Reference Point Session Dosage Given: 2 Gy
Reference Point Session Dosage Given: 2 Gy
Session Number: 17

## 2022-01-03 ENCOUNTER — Ambulatory Visit
Admission: RE | Admit: 2022-01-03 | Discharge: 2022-01-03 | Disposition: A | Discharge status: Home / Self Care | Payer: Medicare PPO | Source: Ambulatory Visit | Attending: Radiation Oncology | Admitting: Radiation Oncology

## 2022-01-03 ENCOUNTER — Other Ambulatory Visit: Payer: Self-pay

## 2022-01-03 DIAGNOSIS — Z510 Encounter for antineoplastic radiation therapy: Secondary | ICD-10-CM | POA: Diagnosis not present

## 2022-01-03 DIAGNOSIS — R600 Localized edema: Secondary | ICD-10-CM | POA: Diagnosis not present

## 2022-01-03 DIAGNOSIS — C50111 Malignant neoplasm of central portion of right female breast: Secondary | ICD-10-CM | POA: Diagnosis not present

## 2022-01-03 DIAGNOSIS — Z171 Estrogen receptor negative status [ER-]: Secondary | ICD-10-CM | POA: Diagnosis not present

## 2022-01-03 LAB — RAD ONC ARIA SESSION SUMMARY
Course Elapsed Days: 26
Plan Fractions Treated to Date: 17
Plan Fractions Treated to Date: 9
Plan Prescribed Dose Per Fraction: 2 Gy
Plan Prescribed Dose Per Fraction: 2 Gy
Plan Total Fractions Prescribed: 12
Plan Total Fractions Prescribed: 24
Plan Total Prescribed Dose: 24 Gy
Plan Total Prescribed Dose: 48 Gy
Reference Point Dosage Given to Date: 36 Gy
Reference Point Dosage Given to Date: 36 Gy
Reference Point Session Dosage Given: 2 Gy
Reference Point Session Dosage Given: 2 Gy
Session Number: 18

## 2022-01-04 ENCOUNTER — Ambulatory Visit
Admission: RE | Admit: 2022-01-04 | Discharge: 2022-01-04 | Disposition: A | Discharge status: Home / Self Care | Payer: Medicare PPO | Source: Ambulatory Visit | Attending: Radiation Oncology | Admitting: Radiation Oncology

## 2022-01-04 ENCOUNTER — Other Ambulatory Visit: Payer: Self-pay

## 2022-01-04 DIAGNOSIS — C50111 Malignant neoplasm of central portion of right female breast: Secondary | ICD-10-CM | POA: Diagnosis not present

## 2022-01-04 DIAGNOSIS — Z510 Encounter for antineoplastic radiation therapy: Secondary | ICD-10-CM | POA: Diagnosis not present

## 2022-01-04 DIAGNOSIS — Z171 Estrogen receptor negative status [ER-]: Secondary | ICD-10-CM | POA: Diagnosis not present

## 2022-01-04 DIAGNOSIS — R600 Localized edema: Secondary | ICD-10-CM | POA: Diagnosis not present

## 2022-01-04 LAB — RAD ONC ARIA SESSION SUMMARY
Course Elapsed Days: 27
Plan Fractions Treated to Date: 10
Plan Fractions Treated to Date: 18
Plan Prescribed Dose Per Fraction: 2 Gy
Plan Prescribed Dose Per Fraction: 2 Gy
Plan Total Fractions Prescribed: 13
Plan Total Fractions Prescribed: 24
Plan Total Prescribed Dose: 26 Gy
Plan Total Prescribed Dose: 48 Gy
Reference Point Dosage Given to Date: 38 Gy
Reference Point Dosage Given to Date: 38 Gy
Reference Point Session Dosage Given: 2 Gy
Reference Point Session Dosage Given: 2 Gy
Session Number: 19

## 2022-01-05 ENCOUNTER — Other Ambulatory Visit: Payer: Self-pay

## 2022-01-05 ENCOUNTER — Ambulatory Visit
Admission: RE | Admit: 2022-01-05 | Discharge: 2022-01-05 | Disposition: A | Discharge status: Home / Self Care | Payer: Medicare PPO | Source: Ambulatory Visit | Attending: Radiation Oncology | Admitting: Radiation Oncology

## 2022-01-05 DIAGNOSIS — C50111 Malignant neoplasm of central portion of right female breast: Secondary | ICD-10-CM | POA: Diagnosis not present

## 2022-01-05 DIAGNOSIS — Z510 Encounter for antineoplastic radiation therapy: Secondary | ICD-10-CM | POA: Diagnosis not present

## 2022-01-05 DIAGNOSIS — Z171 Estrogen receptor negative status [ER-]: Secondary | ICD-10-CM | POA: Diagnosis not present

## 2022-01-05 DIAGNOSIS — R600 Localized edema: Secondary | ICD-10-CM | POA: Diagnosis not present

## 2022-01-05 LAB — RAD ONC ARIA SESSION SUMMARY
Course Elapsed Days: 28
Plan Fractions Treated to Date: 10
Plan Fractions Treated to Date: 19
Plan Prescribed Dose Per Fraction: 2 Gy
Plan Prescribed Dose Per Fraction: 2 Gy
Plan Total Fractions Prescribed: 12
Plan Total Fractions Prescribed: 24
Plan Total Prescribed Dose: 24 Gy
Plan Total Prescribed Dose: 48 Gy
Reference Point Dosage Given to Date: 40 Gy
Reference Point Dosage Given to Date: 40 Gy
Reference Point Session Dosage Given: 2 Gy
Reference Point Session Dosage Given: 2 Gy
Session Number: 20

## 2022-01-06 ENCOUNTER — Ambulatory Visit
Admission: RE | Admit: 2022-01-06 | Discharge: 2022-01-06 | Disposition: A | Discharge status: Home / Self Care | Payer: Medicare PPO | Source: Ambulatory Visit | Attending: Radiation Oncology | Admitting: Radiation Oncology

## 2022-01-06 ENCOUNTER — Other Ambulatory Visit: Payer: Self-pay

## 2022-01-06 DIAGNOSIS — Z171 Estrogen receptor negative status [ER-]: Secondary | ICD-10-CM | POA: Diagnosis not present

## 2022-01-06 DIAGNOSIS — Z510 Encounter for antineoplastic radiation therapy: Secondary | ICD-10-CM | POA: Diagnosis not present

## 2022-01-06 DIAGNOSIS — C50111 Malignant neoplasm of central portion of right female breast: Secondary | ICD-10-CM | POA: Diagnosis not present

## 2022-01-06 DIAGNOSIS — R600 Localized edema: Secondary | ICD-10-CM | POA: Diagnosis not present

## 2022-01-06 LAB — RAD ONC ARIA SESSION SUMMARY
Course Elapsed Days: 29
Plan Fractions Treated to Date: 11
Plan Fractions Treated to Date: 20
Plan Prescribed Dose Per Fraction: 2 Gy
Plan Prescribed Dose Per Fraction: 2 Gy
Plan Total Fractions Prescribed: 13
Plan Total Fractions Prescribed: 24
Plan Total Prescribed Dose: 26 Gy
Plan Total Prescribed Dose: 48 Gy
Reference Point Dosage Given to Date: 42 Gy
Reference Point Dosage Given to Date: 42 Gy
Reference Point Session Dosage Given: 2 Gy
Reference Point Session Dosage Given: 2 Gy
Session Number: 21

## 2022-01-06 NOTE — Progress Notes (Signed)
Patient Care Team: Donnajean Lopes, MD as PCP - General (Internal Medicine) Mauro Kaufmann, RN as Oncology Nurse Navigator Rockwell Germany, RN as Oncology Nurse Navigator  DIAGNOSIS: No diagnosis found.  SUMMARY OF ONCOLOGIC HISTORY: Oncology History  Primary cancer of lower outer quadrant of left female breast ([place])  01/04/2012 Surgery   Left mastectomy: Invasive ductal carcinoma 0.5 cm with high-grade DCIS 9 cm, margins negative, 0/6 lymph nodes, ER 100%, PR 100%, Ki-67 17%, HER-2 negative ratio 1.16   02/07/2012 - 05/28/2016 Anti-estrogen oral therapy   Anastrozole 1 mg daily (stopped early due to Osteoporosis)   06/01/2021 Initial Diagnosis   Left breast cancer invasive ductal carcinoma: 0.5 cm with 9 mm area of DCIS status post left mastectomy 01/04/2012 ER/PR positive HER-2 negative Ki-67 17% currently on Arimidex 1 mg daily  Since 02/07/2012.  Completed 7 years of October 2020  (cecal adenocarcinoma diagnosed 04/22/2013, preop CEA of 5.1, status post right hemicolectomy 04/28/2014, grade 2, no MVI, 0/25 lymph nodes, subserosal involvement, T3 N0 M0 stage II A, no mismatch repair)   06/23/2021 Surgery   Right mastectomy: Grade 3 IDC with DCIS, 3.4 cm, margins negative, 2/3 lymph nodes positive ER 0%, PR 0%, HER2 negative, Ki-67 20%   Primary colon cancer ([place])  03/20/2014 Initial Diagnosis   Invasive adenocarcinoma of [place]   04/28/2014 Surgery   Right hemicolectomy: Invasive adenocarcinoma with mucinous features moderately differentiated invading subserosal tissue, margins negative, 25 lymph nodes negative, T3 N0 M0 stage II a, no microsatellite instability   Malignant neoplasm of central portion of right breast in female, estrogen receptor negative ([place])  06/01/2021 Initial Diagnosis   Left breast cancer invasive ductal carcinoma: 0.5 cm with 9 mm area of DCIS status post left mastectomy 01/04/2012 ER/PR positive HER-2 negative Ki-67 17% currently on Arimidex 1 mg daily   Since 02/07/2012.  Completed 7 years of October 2020  (cecal adenocarcinoma diagnosed 04/22/2013, preop CEA of 5.1, status post right hemicolectomy 04/28/2014, grade 2, no MVI, 0/25 lymph nodes, subserosal involvement, T3 N0 M0 stage II A, no mismatch repair)   06/01/2021 Cancer Staging   Staging form: Breast, AJCC 8th Edition - Clinical: Stage IB (cT1c, cN0, cM0, G3, ER-, PR-, HER2-) - Signed by Nicholas Lose, MD on 06/01/2021 Histologic grading system: 3 grade system   06/08/2021 Imaging   CT CAP and bone scan: 06/08/2021: Multiple small lung nodules stable with the exception of new 4 mm left lower lobe lung nodule and a 6 mm right lower lobe lung nodule open (nonspecific)   06/23/2021 Surgery   06/23/2021:Right mastectomy: Grade 3 IDC with DCIS, 3.4 cm, margins negative, 2/3 lymph nodes positive ER 0%, PR 0%, HER2 negative, Ki-67 20%   06/23/2021 Cancer Staging   Staging form: Breast, AJCC 8th Edition - Pathologic stage from 06/23/2021: Stage IIIA (pT2, pN1a, cM0, G3, ER-, PR-, HER2-) - Signed by Gardenia Phlegm, NP on 11/15/2021 Stage prefix: Initial diagnosis Histologic grading system: 3 grade system   08/02/2021 - 11/15/2021 Adjuvant Chemotherapy   CMF every 21 days x 6 cycles   Cancer of central portion of right breast ([place])  06/23/2021 Initial Diagnosis   Cancer of central portion of right breast ([place])   11/08/2021 Cancer Staging   Staging form: Breast, AJCC 8th Edition - Pathologic stage from 11/08/2021: Stage IIIA (pT2, pN1, cM0, G3, ER-, PR-, HER2-) - Signed by Eppie Gibson, MD on 11/08/2021 Stage prefix: Initial diagnosis Histologic grading system: 3 grade system  CHIEF COMPLIANT: Cycle 6 CMF INTERVAL HISTORY: HETTY LINHART is a 81 y.o. with above-mentioned history of left breast cancer who is currently on surveillance after completion of adjuvant antiestrogen therapy with anastrozole. She presents to the clinic today for a follow-up.   ALLERGIES:  is allergic to other,  pollen extract, tape, tetracycline, tyloxapol, and terramycin [oxytetracycline].  MEDICATIONS:  Current Outpatient Medications  Medication Sig Dispense Refill   acetaminophen (TYLENOL) 650 MG CR tablet Take 650 mg by mouth every 8 (eight) hours as needed for pain.     ADVAIR DISKUS 250-50 MCG/DOSE AEPB Inhale 1 puff into the lungs 2 (two) times daily. 180 each 0   albuterol (VENTOLIN HFA) 108 (90 Base) MCG/ACT inhaler Inhale 2 puffs into the lungs every 6 (six) hours as needed for wheezing or shortness of breath.     alendronate (FOSAMAX) 70 MG tablet Take 1 tablet (70 mg total) by mouth once a week. Take with a full glass of water on an empty stomach. (Patient taking differently: Take 70 mg by mouth once a week. Take with a full glass of water on an empty stomach. On Saturday)     ALPRAZolam (XANAX) 0.25 MG tablet Take 1 tablet (0.25 mg total) by mouth at bedtime as needed for anxiety. 30 tablet 0   celecoxib (CELEBREX) 200 MG capsule Take 200 mg by mouth daily as needed for mild pain.     gabapentin (NEURONTIN) 100 MG capsule Take 1 capsule (100 mg total) by mouth 3 (three) times daily. (Patient taking differently: Take 100 mg by mouth at bedtime.)     hydrochlorothiazide (MICROZIDE) 12.5 MG capsule Take 12.5 mg by mouth daily.     hydrocortisone (ANUSOL-HC) 2.5 % rectal cream Place 1 application. rectally 2 (two) times daily. (Patient taking differently: Place 1 application  rectally 2 (two) times daily as needed for hemorrhoids or anal itching.) 30 g 1   loratadine (CLARITIN) 10 MG tablet Take 10 mg by mouth daily as needed for allergies.     losartan (COZAAR) 50 MG tablet Take 50 mg by mouth every evening.     melatonin 5 MG TABS Take 5 mg by mouth at bedtime as needed (sleep).     montelukast (SINGULAIR) 10 MG tablet Take 10 mg by mouth at bedtime.     ondansetron (ZOFRAN) 8 MG tablet Take 1 tablet (8 mg total) by mouth 2 (two) times daily as needed for refractory nausea / vomiting. Start on  day 3 after chemotherapy. 30 tablet 1   polyvinyl alcohol (LIQUIFILM TEARS) 1.4 % ophthalmic solution Place 2 drops into both eyes as needed for dry eyes.     prochlorperazine (COMPAZINE) 10 MG tablet Take 1 tablet (10 mg total) by mouth every 6 (six) hours as needed (Nausea or vomiting). 30 tablet 1   Vitamin D, Ergocalciferol, (DRISDOL) 1.25 MG (50000 UNIT) CAPS capsule Take 50,000 Units by mouth every 7 (seven) days. Tuesday     No current facility-administered medications for this visit.    PHYSICAL EXAMINATION: ECOG PERFORMANCE STATUS: {CHL ONC ECOG PS:[id number]}  There were no vitals filed for this visit. There were no vitals filed for this visit.  BREAST:*** No palpable masses or nodules in either right or left breasts. No palpable axillary supraclavicular or infraclavicular adenopathy no breast tenderness or nipple discharge. (exam performed in the presence of a chaperone)  LABORATORY DATA:  I have reviewed the data as listed    Latest Ref Rng & Units 11/15/2021  11:08 AM 10/25/2021   11:32 AM 10/04/2021   11:01 AM  CMP  Glucose 70 - 99 mg/dL 111  90  106   BUN 8 - 23 mg/dL 12  18  12    Creatinine 0.44 - 1.00 mg/dL 0.64  0.81  0.82   Sodium 135 - 145 mmol/L 138  136  139   Potassium 3.5 - 5.1 mmol/L 3.4  3.6  3.7   Chloride 98 - 111 mmol/L 103  101  104   CO2 22 - 32 mmol/L 30  29  29    Calcium 8.9 - 10.3 mg/dL 9.3  9.7  9.8   Total Protein 6.5 - 8.1 g/dL 6.5  6.9  6.8   Total Bilirubin 0.3 - 1.2 mg/dL 0.3  0.4  0.3   Alkaline Phos 38 - 126 U/L 79  75  85   AST 15 - 41 U/L 22  19  18    ALT 0 - 44 U/L 23  19  17      Lab Results  Component Value Date   WBC 5.1 11/15/2021   HGB 13.9 11/15/2021   HCT 41.5 11/15/2021   MCV 90.8 11/15/2021   PLT 227 11/15/2021   NEUTROABS 3.1 11/15/2021    ASSESSMENT & PLAN:  No problem-specific Assessment & Plan notes found for this encounter.    No orders of the defined types were placed in this encounter.  The patient has a  good understanding of the overall plan. she agrees with it. she will call with any problems that may develop before the next visit here. Total time spent: 30 mins including face to face time and time spent for planning, charting and co-ordination of care   Suzzette Righter, [place] 01/06/22    I Gardiner Coins am scribing for Dr. Lindi Adie  ***

## 2022-01-09 ENCOUNTER — Inpatient Hospital Stay: Payer: Medicare PPO | Admitting: Hematology and Oncology

## 2022-01-09 ENCOUNTER — Ambulatory Visit
Admission: RE | Admit: 2022-01-09 | Discharge: 2022-01-09 | Disposition: A | Discharge status: Home / Self Care | Payer: Medicare PPO | Source: Ambulatory Visit | Attending: Radiation Oncology | Admitting: Radiation Oncology

## 2022-01-09 ENCOUNTER — Other Ambulatory Visit: Payer: Self-pay

## 2022-01-09 ENCOUNTER — Ambulatory Visit: Payer: Medicare PPO

## 2022-01-09 VITALS — BP 128/80 | HR 77 | Temp 97.5°F | Resp 18 | Ht 63.5 in | Wt 207.0 lb

## 2022-01-09 DIAGNOSIS — L509 Urticaria, unspecified: Secondary | ICD-10-CM | POA: Insufficient documentation

## 2022-01-09 DIAGNOSIS — R600 Localized edema: Secondary | ICD-10-CM | POA: Insufficient documentation

## 2022-01-09 DIAGNOSIS — Z79811 Long term (current) use of aromatase inhibitors: Secondary | ICD-10-CM | POA: Insufficient documentation

## 2022-01-09 DIAGNOSIS — Z85038 Personal history of other malignant neoplasm of large intestine: Secondary | ICD-10-CM | POA: Insufficient documentation

## 2022-01-09 DIAGNOSIS — C189 Malignant neoplasm of colon, unspecified: Secondary | ICD-10-CM | POA: Diagnosis not present

## 2022-01-09 DIAGNOSIS — Z510 Encounter for antineoplastic radiation therapy: Secondary | ICD-10-CM | POA: Diagnosis not present

## 2022-01-09 DIAGNOSIS — C50111 Malignant neoplasm of central portion of right female breast: Secondary | ICD-10-CM | POA: Insufficient documentation

## 2022-01-09 DIAGNOSIS — Z171 Estrogen receptor negative status [ER-]: Secondary | ICD-10-CM | POA: Insufficient documentation

## 2022-01-09 DIAGNOSIS — C50512 Malignant neoplasm of lower-outer quadrant of left female breast: Secondary | ICD-10-CM | POA: Diagnosis not present

## 2022-01-09 LAB — RAD ONC ARIA SESSION SUMMARY
Course Elapsed Days: 32
Plan Fractions Treated to Date: 11
Plan Fractions Treated to Date: 21
Plan Prescribed Dose Per Fraction: 2 Gy
Plan Prescribed Dose Per Fraction: 2 Gy
Plan Total Fractions Prescribed: 12
Plan Total Fractions Prescribed: 24
Plan Total Prescribed Dose: 24 Gy
Plan Total Prescribed Dose: 48 Gy
Reference Point Dosage Given to Date: 44 Gy
Reference Point Dosage Given to Date: 44 Gy
Reference Point Session Dosage Given: 2 Gy
Reference Point Session Dosage Given: 2 Gy
Session Number: 22

## 2022-01-09 NOTE — Assessment & Plan Note (Signed)
Left breast cancer invasive ductal carcinoma: 0.5 cm with 9 mm area of DCIS status post left mastectomy 01/04/2012 ER/PR positive HER-2 negative Ki-67 17% currently on Arimidex 1 mg daily Since 02/07/2012.Completed 7 years of October 2020 (cecal adenocarcinoma diagnosed 04/22/2013, preop CEA of 5.1, status post right hemicolectomy 04/28/2014, grade 2, no MVI, 0/25 lymph nodes, subserosal involvement, T3 N0 M0 stage II A, no mismatch repair)  Contralateral breast cancer: 05/19/2021: Right breast mammogram and ultrasound: 1.6 cm mass in the right breast 12 o'clock position with architectural distortion highly suspicious for malignancy, no axillary lymph nodes, biopsy: Grade 3 IDC ER 0%, PR 0%, HER2 negative 1+ by IHC, Ki-67 20%  06/23/2021:Right mastectomy: Grade 3 IDC with DCIS, 3.4 cm, margins negative, 2/3 lymph nodes positive ER 0%, PR 0%, HER2 negative, Ki-67 20%  CT CAP and bone scan: 06/08/2021: Multiple small lung nodules stable with the exception of new 4 mm left lower lobe lung nodule and a 6 mm right lower lobe lung nodule open (nonspecific)  Treatment plan: 1. Adjuvant chemotherapy with CMF x6 cycles completed 11/15/2021 2. Adjuvant radiation 12/09/2021-01/12/2022 ------------------------------------------------------------------------------------------------------- Patient has recovered from most of the adverse effects from chemotherapy. Return to clinic in 3 months for survivorship care plan visit

## 2022-01-10 ENCOUNTER — Ambulatory Visit
Admission: RE | Admit: 2022-01-10 | Discharge: 2022-01-10 | Disposition: A | Discharge status: Home / Self Care | Payer: Medicare PPO | Source: Ambulatory Visit | Attending: Radiation Oncology | Admitting: Radiation Oncology

## 2022-01-10 ENCOUNTER — Other Ambulatory Visit: Payer: Self-pay

## 2022-01-10 ENCOUNTER — Telehealth: Payer: Self-pay | Admitting: Hematology and Oncology

## 2022-01-10 DIAGNOSIS — Z510 Encounter for antineoplastic radiation therapy: Secondary | ICD-10-CM | POA: Diagnosis not present

## 2022-01-10 DIAGNOSIS — R600 Localized edema: Secondary | ICD-10-CM | POA: Diagnosis not present

## 2022-01-10 DIAGNOSIS — Z171 Estrogen receptor negative status [ER-]: Secondary | ICD-10-CM | POA: Diagnosis not present

## 2022-01-10 DIAGNOSIS — C50111 Malignant neoplasm of central portion of right female breast: Secondary | ICD-10-CM | POA: Diagnosis not present

## 2022-01-10 LAB — RAD ONC ARIA SESSION SUMMARY
Course Elapsed Days: 33
Plan Fractions Treated to Date: 12
Plan Fractions Treated to Date: 22
Plan Prescribed Dose Per Fraction: 2 Gy
Plan Prescribed Dose Per Fraction: 2 Gy
Plan Total Fractions Prescribed: 13
Plan Total Fractions Prescribed: 24
Plan Total Prescribed Dose: 26 Gy
Plan Total Prescribed Dose: 48 Gy
Reference Point Dosage Given to Date: 46 Gy
Reference Point Dosage Given to Date: 46 Gy
Reference Point Session Dosage Given: 2 Gy
Reference Point Session Dosage Given: 2 Gy
Session Number: 23

## 2022-01-10 NOTE — Telephone Encounter (Signed)
Scheduled appointment per 9/25 los. Left voicemail.

## 2022-01-11 ENCOUNTER — Other Ambulatory Visit: Payer: Self-pay

## 2022-01-11 ENCOUNTER — Encounter: Payer: Self-pay | Admitting: *Deleted

## 2022-01-11 ENCOUNTER — Ambulatory Visit
Admission: RE | Admit: 2022-01-11 | Discharge: 2022-01-11 | Disposition: A | Discharge status: Home / Self Care | Payer: Medicare PPO | Source: Ambulatory Visit | Attending: Radiation Oncology | Admitting: Radiation Oncology

## 2022-01-11 DIAGNOSIS — C50111 Malignant neoplasm of central portion of right female breast: Secondary | ICD-10-CM | POA: Diagnosis not present

## 2022-01-11 DIAGNOSIS — Z510 Encounter for antineoplastic radiation therapy: Secondary | ICD-10-CM | POA: Diagnosis not present

## 2022-01-11 DIAGNOSIS — R600 Localized edema: Secondary | ICD-10-CM | POA: Diagnosis not present

## 2022-01-11 DIAGNOSIS — Z171 Estrogen receptor negative status [ER-]: Secondary | ICD-10-CM | POA: Diagnosis not present

## 2022-01-11 DIAGNOSIS — C50512 Malignant neoplasm of lower-outer quadrant of left female breast: Secondary | ICD-10-CM

## 2022-01-11 LAB — RAD ONC ARIA SESSION SUMMARY
Course Elapsed Days: 34
Plan Fractions Treated to Date: 12
Plan Fractions Treated to Date: 23
Plan Prescribed Dose Per Fraction: 2 Gy
Plan Prescribed Dose Per Fraction: 2 Gy
Plan Total Fractions Prescribed: 12
Plan Total Fractions Prescribed: 24
Plan Total Prescribed Dose: 24 Gy
Plan Total Prescribed Dose: 48 Gy
Reference Point Dosage Given to Date: 48 Gy
Reference Point Dosage Given to Date: 48 Gy
Reference Point Session Dosage Given: 2 Gy
Reference Point Session Dosage Given: 2 Gy
Session Number: 24

## 2022-01-12 ENCOUNTER — Other Ambulatory Visit: Payer: Self-pay

## 2022-01-12 ENCOUNTER — Inpatient Hospital Stay (HOSPITAL_BASED_OUTPATIENT_CLINIC_OR_DEPARTMENT_OTHER): Payer: Medicare PPO | Admitting: Physician Assistant

## 2022-01-12 ENCOUNTER — Ambulatory Visit
Admission: RE | Admit: 2022-01-12 | Discharge: 2022-01-12 | Disposition: A | Discharge status: Home / Self Care | Payer: Medicare PPO | Source: Ambulatory Visit | Attending: Radiation Oncology | Admitting: Radiation Oncology

## 2022-01-12 ENCOUNTER — Encounter: Payer: Self-pay | Admitting: Radiation Oncology

## 2022-01-12 ENCOUNTER — Inpatient Hospital Stay: Payer: Medicare PPO

## 2022-01-12 VITALS — BP 132/78 | HR 93 | Temp 97.7°F | Resp 16 | Wt 210.0 lb

## 2022-01-12 DIAGNOSIS — R609 Edema, unspecified: Secondary | ICD-10-CM

## 2022-01-12 DIAGNOSIS — C50111 Malignant neoplasm of central portion of right female breast: Secondary | ICD-10-CM

## 2022-01-12 DIAGNOSIS — R600 Localized edema: Secondary | ICD-10-CM | POA: Diagnosis not present

## 2022-01-12 DIAGNOSIS — Z171 Estrogen receptor negative status [ER-]: Secondary | ICD-10-CM | POA: Diagnosis not present

## 2022-01-12 DIAGNOSIS — M7989 Other specified soft tissue disorders: Secondary | ICD-10-CM

## 2022-01-12 DIAGNOSIS — Z510 Encounter for antineoplastic radiation therapy: Secondary | ICD-10-CM | POA: Diagnosis not present

## 2022-01-12 DIAGNOSIS — Z23 Encounter for immunization: Secondary | ICD-10-CM | POA: Diagnosis not present

## 2022-01-12 LAB — CMP (CANCER CENTER ONLY)
ALT: 20 U/L (ref 0–44)
AST: 21 U/L (ref 15–41)
Albumin: 4.2 g/dL (ref 3.5–5.0)
Alkaline Phosphatase: 83 U/L (ref 38–126)
Anion gap: 11 (ref 5–15)
BUN: 12 mg/dL (ref 8–23)
CO2: 26 mmol/L (ref 22–32)
Calcium: 9.4 mg/dL (ref 8.9–10.3)
Chloride: 100 mmol/L (ref 98–111)
Creatinine: 0.59 mg/dL (ref 0.44–1.00)
GFR, Estimated: >60 mL/min (ref >60)
Glucose, Bld: 152 mg/dL — ABNORMAL HIGH (ref 70–99)
Potassium: 3.5 mmol/L (ref 3.5–5.1)
Sodium: 137 mmol/L (ref 135–145)
Total Bilirubin: 0.5 mg/dL (ref 0.3–1.2)
Total Protein: 7.2 g/dL (ref 6.5–8.1)

## 2022-01-12 LAB — RAD ONC ARIA SESSION SUMMARY
Course Elapsed Days: 35
Plan Fractions Treated to Date: 13
Plan Fractions Treated to Date: 24
Plan Prescribed Dose Per Fraction: 2 Gy
Plan Prescribed Dose Per Fraction: 2 Gy
Plan Total Fractions Prescribed: 13
Plan Total Fractions Prescribed: 24
Plan Total Prescribed Dose: 26 Gy
Plan Total Prescribed Dose: 48 Gy
Reference Point Dosage Given to Date: 50 Gy
Reference Point Dosage Given to Date: 50 Gy
Reference Point Session Dosage Given: 2 Gy
Reference Point Session Dosage Given: 2 Gy
Session Number: 25

## 2022-01-12 LAB — CBC WITH DIFFERENTIAL (CANCER CENTER ONLY)
Abs Immature Granulocytes: 0.1 10*3/uL — ABNORMAL HIGH (ref 0.00–0.07)
Basophils Absolute: 0 10*3/uL (ref 0.0–0.1)
Basophils Relative: 1 %
Eosinophils Absolute: 0.2 10*3/uL (ref 0.0–0.5)
Eosinophils Relative: 3 %
HCT: 44.6 % (ref 36.0–46.0)
Hemoglobin: 15.3 g/dL — ABNORMAL HIGH (ref 12.0–15.0)
Immature Granulocytes: 2 %
Lymphocytes Relative: 5 %
Lymphs Abs: 0.3 10*3/uL — ABNORMAL LOW (ref 0.7–4.0)
MCH: 31 pg (ref 26.0–34.0)
MCHC: 34.3 g/dL (ref 30.0–36.0)
MCV: 90.3 fL (ref 80.0–100.0)
Monocytes Absolute: 0.4 10*3/uL (ref 0.1–1.0)
Monocytes Relative: 6 %
Neutro Abs: 5.5 10*3/uL (ref 1.7–7.7)
Neutrophils Relative %: 83 %
Platelet Count: 209 10*3/uL (ref 150–400)
RBC: 4.94 MIL/uL (ref 3.87–5.11)
RDW: 13 % (ref 11.5–15.5)
WBC Count: 6.6 10*3/uL (ref 4.0–10.5)
nRBC: 0 % (ref 0.0–0.2)

## 2022-01-12 LAB — BRAIN NATRIURETIC PEPTIDE: B Natriuretic Peptide: 17.2 pg/mL (ref 0.0–100.0)

## 2022-01-12 LAB — T4, FREE: Free T4: 1.01 ng/dL (ref 0.61–1.12)

## 2022-01-12 LAB — TSH: TSH: 1.48 u[IU]/mL (ref 0.350–4.500)

## 2022-01-12 NOTE — Progress Notes (Signed)
Lab orders entered for [facility] visit.  

## 2022-01-12 NOTE — Progress Notes (Signed)
Symptom Management Consult note [place]    Patient Care Team: Donnajean Lopes, MD as PCP - General (Internal Medicine) Mauro Kaufmann, RN as Oncology Nurse Navigator Rockwell Germany, RN as Oncology Nurse Navigator Nicholas Lose, MD as Consulting Physician (Hematology and Oncology)    Name of the patient: Ann Maxwell  [id number]  April 07, 1941   Date of visit: 01/12/2022   Chief Complaint/Reason for visit: hives, leg swelling   Current Therapy: recently finished surveillance with adjuvant antiestrogen with anastrozole, radiation    ASSESSMENT & PLAN: Patient is a 81 y.o. female  with most recent oncologic history of ER positive right breast cancer followed by Dr. Lindi Adie.  I have viewed most recent oncology note and lab work.    #) ER + right breast cancer s/p mastectomy -Final radiation treatment today - Next appointment with oncologist is 03/31/22 with survivorship clinic   #)Hives -Resolved PTA after taking Claritin. HPI not suggestive of anaphylaxis.  Recommend she continue Claritin daily for the next week. -Has had hives in the past associated with anxiety.  She did admit to being anxious today so could be possible cause.  #)Bilateral lower extremity edema -Nonpitting edema on exam.  Negative Homans' sign bilaterally.  -No shortness of breath, no symptoms to suggest heart failure. -CBC, BNP, and CMP are overall unremarkable. -She does admit to chills over the last several months.  With nonpitting edema will check TSH, T3 and T4 to rule out hypothyroidism.  If labs are unremarkable recommend she follow-up with PCP for further evaluation. -Patient will monitor her weight over the next month to see if this could be result of weight gain.  She admitted to being less active during radiation therapy. -Strict ED precautions discussed should symptoms worsen.     Heme/Onc History: Oncology History  Primary cancer of lower outer quadrant of left  female breast ([place])  01/04/2012 Surgery   Left mastectomy: Invasive ductal carcinoma 0.5 cm with high-grade DCIS 9 cm, margins negative, 0/6 lymph nodes, ER 100%, PR 100%, Ki-67 17%, HER-2 negative ratio 1.16   02/07/2012 - 05/28/2016 Anti-estrogen oral therapy   Anastrozole 1 mg daily (stopped early due to Osteoporosis)   06/01/2021 Initial Diagnosis   Left breast cancer invasive ductal carcinoma: 0.5 cm with 9 mm area of DCIS status post left mastectomy 01/04/2012 ER/PR positive HER-2 negative Ki-67 17% currently on Arimidex 1 mg daily  Since 02/07/2012.  Completed 7 years of October 2020  (cecal adenocarcinoma diagnosed 04/22/2013, preop CEA of 5.1, status post right hemicolectomy 04/28/2014, grade 2, no MVI, 0/25 lymph nodes, subserosal involvement, T3 N0 M0 stage II A, no mismatch repair)   06/23/2021 Surgery   Right mastectomy: Grade 3 IDC with DCIS, 3.4 cm, margins negative, 2/3 lymph nodes positive ER 0%, PR 0%, HER2 negative, Ki-67 20%   Primary colon cancer ([place])  03/20/2014 Initial Diagnosis   Invasive adenocarcinoma of [place]   04/28/2014 Surgery   Right hemicolectomy: Invasive adenocarcinoma with mucinous features moderately differentiated invading subserosal tissue, margins negative, 25 lymph nodes negative, T3 N0 M0 stage II a, no microsatellite instability   Malignant neoplasm of central portion of right breast in female, estrogen receptor negative ([place])  06/01/2021 Initial Diagnosis   Left breast cancer invasive ductal carcinoma: 0.5 cm with 9 mm area of DCIS status post left mastectomy 01/04/2012 ER/PR positive HER-2 negative Ki-67 17% currently on Arimidex 1 mg daily  Since 02/07/2012.  Completed 7 years of October 2020  (  cecal adenocarcinoma diagnosed 04/22/2013, preop CEA of 5.1, status post right hemicolectomy 04/28/2014, grade 2, no MVI, 0/25 lymph nodes, subserosal involvement, T3 N0 M0 stage II A, no mismatch repair)   06/01/2021 Cancer Staging   Staging form: Breast, AJCC  8th Edition - Clinical: Stage IB (cT1c, cN0, cM0, G3, ER-, PR-, HER2-) - Signed by Nicholas Lose, MD on 06/01/2021 Histologic grading system: 3 grade system   06/08/2021 Imaging   CT CAP and bone scan: 06/08/2021: Multiple small lung nodules stable with the exception of new 4 mm left lower lobe lung nodule and a 6 mm right lower lobe lung nodule open (nonspecific)   06/23/2021 Surgery   06/23/2021:Right mastectomy: Grade 3 IDC with DCIS, 3.4 cm, margins negative, 2/3 lymph nodes positive ER 0%, PR 0%, HER2 negative, Ki-67 20%   06/23/2021 Cancer Staging   Staging form: Breast, AJCC 8th Edition - Pathologic stage from 06/23/2021: Stage IIIA (pT2, pN1a, cM0, G3, ER-, PR-, HER2-) - Signed by Gardenia Phlegm, NP on 11/15/2021 Stage prefix: Initial diagnosis Histologic grading system: 3 grade system   08/02/2021 - 11/15/2021 Adjuvant Chemotherapy   CMF every 21 days x 6 cycles   Cancer of central portion of right breast ([place] )  06/23/2021 Initial Diagnosis   Cancer of central portion of right breast ([place])   11/08/2021 Cancer Staging   Staging form: Breast, AJCC 8th Edition - Pathologic stage from 11/08/2021: Stage IIIA (pT2, pN1, cM0, G3, ER-, PR-, HER2-) - Signed by Eppie Gibson, MD on 11/08/2021 Stage prefix: Initial diagnosis Histologic grading system: 3 grade system       Interval history-: Ann Maxwell is a 81 y.o. female with oncologic history as above presenting to [facility] today with chief complaint of bilateral lower extremity edema and hives.  Patient states this morning she noticed hives on her right lower leg. It then progressed to her left lower leg and both arms.  She denies any known allergen exposure.  She had not been outside this morning or bit by an insect that she is aware of. She had associated pruritus. She took Claritin and the gives resolved. She admits to hives years ago that were caused by anxiety. She admits to being anxious lately.   Patient reports bilateral lower  extremity edema x2 weeks. Swelling is pretibial she states.  It has been constant.  She denies any new medications.  Denies high salt diet.  She does admit to being more sedentary while undergoing radiation therapy.  She denies any associated pain.  She wonders if she has just gained weight or if the swelling means anything.  She denies any history of heart failure.  Denies any shortness of breath, orthopnea.    ROS  All other systems are reviewed and are negative for acute change except as noted in the HPI.    Allergies  Allergen Reactions   Other Shortness Of Breath and Other (See Comments)    Tree and shrub pollen    Pollen Extract Other (See Comments)   Tape Other (See Comments) and Rash    Surgical tape causes blisters   Tetracycline Other (See Comments)   Tyloxapol Nausea And Vomiting    Tylox    Terramycin [Oxytetracycline] Rash     Past Medical History:  Diagnosis Date   Allergy    Anemia    iron infusion -last done 1 month ago(12-15, and 12-22 -12 [facility])- Dr. Lindi Adie   Anxiety    Asthma    Breast CA [facility]) 2013  a. L breast DCIS, s/p L mastectomy 12/2011. no further issues   Breast cancer ([place]) 2023   Right breast IDC   Cataract    bilat removed    Colon cancer ([place])    Depression    pt denies any hx of depression    Environmental allergies    History of kidney stones    Hypertension    controlled on meds    Internal hemorrhoids    remains an issue- no bright bleeding seen recent   Iron deficiency anemia    Obesity    Osteoarthritis    arthritis- knees and shoulders   Osteoporosis    drug induced and on Fosamax   Transfusion history    2 yrs ago due to anemia   Vertigo    no recent issues     Past Surgical History:  Procedure Laterality Date   BREAST BIOPSY Right 05/27/2021   U/S Bx   BREAST SURGERY     CATARACT EXTRACTION, BILATERAL  2019   CHOLECYSTECTOMY     COLON RESECTION N/A 04/28/2014   Procedure: LAPAROSCOPIC  RIGHT HEMI-COLECTOMY;   Surgeon: Stark Klein, MD;  Location: [facility];  Service: General;  Laterality: N/A;   COLONOSCOPY     Lithectomy  2010   LITHOTRIPSY     MASTECTOMY Left 2013   MASTECTOMY W/ SENTINEL NODE BIOPSY  01/04/2012   Procedure: MASTECTOMY WITH SENTINEL LYMPH NODE BIOPSY;  Surgeon: Stark Klein, MD;  Location: [place];  Service: General;  Laterality: Left;   ORIF [place]     wtih subsequent pin removal in 1997   ORIF ULNAR / RADIAL SHAFT FRACTURE Left    POLYPECTOMY     PORT-A-CATH REMOVAL Left 12/01/2021   Procedure: REMOVAL PORT-A-CATH;  Surgeon: Stark Klein, MD;  Location: [place];  Service: General;  Laterality: Left;   PORTACATH PLACEMENT Left 06/23/2021   Procedure: INSERTION PORT-A-CATH;  Surgeon: Stark Klein, MD;  Location: [place];  Service: General;  Laterality: Left;   SENTINEL NODE BIOPSY Right 06/23/2021   Procedure: SENTINEL LYMPH NODE BIOPSY;  Surgeon: Stark Klein, MD;  Location: [place];  Service: General;  Laterality: Right;   SIMPLE MASTECTOMY WITH AXILLARY SENTINEL NODE BIOPSY Right 06/23/2021   Procedure: RIGHT MASTECTOMY;  Surgeon: Stark Klein, MD;  Location: [place];  Service: General;  Laterality: Right;   TONSILLECTOMY AND ADENOIDECTOMY     TOTAL KNEE ARTHROPLASTY Left 08/23/2015   Procedure: TOTAL KNEE ARTHROPLASTY;  Surgeon: Gaynelle Arabian, MD;  Location: [facility];  Service: Orthopedics;  Laterality: Left;   VAGINAL HYSTERECTOMY  20 yrs. ago   with BSO    Social History   Socioeconomic History   Marital status: Divorced    Spouse name: Not on file   Number of children: Not on file   Years of education: Not on file   Highest education level: Not on file  Occupational History   Occupation: retired Marine scientist  Tobacco Use   Smoking status: Never    Passive exposure: Past   Smokeless tobacco: Never  Vaping Use   Vaping Use: Never used  Substance and Sexual Activity   Alcohol use: Not Currently     Comment: occasionally - few times a year    Drug use: Never   Sexual activity: Not Currently    Birth control/protection: Surgical  Other Topics Concern   Not on file  Social History Narrative   Retired Sports coach, one adult  daughter who is a Marine scientist   Social Determinants of Radio broadcast assistant Strain: Not on file  Food Insecurity: Not on file  Transportation Needs: Not on file  Physical Activity: Not on file  Stress: Not on file  Social Connections: Not on file  Intimate Partner Violence: Not on file    Family History  Problem Relation Age of Onset   Congestive Heart Failure Father        Died at 36   Hypertension Father    Heart disease Father    Stroke Father    Ovarian cancer Mother 33       deceased 76   Uterine cancer Maternal Grandmother        late 79s   Liver cancer Paternal Grandfather 2       deceased 44s   Thyroid cancer Sister 33       currently 84   Colon cancer Neg Hx    Stomach cancer Neg Hx    Esophageal cancer Neg Hx    Rectal cancer Neg Hx    Breast cancer Neg Hx    Colon polyps Neg Hx      Current Outpatient Medications:    acetaminophen (TYLENOL) 650 MG CR tablet, Take 650 mg by mouth every 8 (eight) hours as needed for pain., Disp: , Rfl:    ADVAIR DISKUS 250-50 MCG/DOSE AEPB, Inhale 1 puff into the lungs 2 (two) times daily., Disp: 180 each, Rfl: 0   albuterol (VENTOLIN HFA) 108 (90 Base) MCG/ACT inhaler, Inhale 2 puffs into the lungs every 6 (six) hours as needed for wheezing or shortness of breath., Disp: , Rfl:    alendronate (FOSAMAX) 70 MG tablet, Take 1 tablet (70 mg total) by mouth once a week. Take with a full glass of water on an empty stomach. (Patient taking differently: Take 70 mg by mouth once a week. Take with a full glass of water on an empty stomach. On Saturday), Disp: , Rfl:    celecoxib (CELEBREX) 200 MG capsule, Take 200 mg by mouth daily as needed for mild pain., Disp: , Rfl:    gabapentin (NEURONTIN) 100 MG  capsule, Take 1 capsule (100 mg total) by mouth 3 (three) times daily. (Patient taking differently: Take 100 mg by mouth at bedtime.), Disp: , Rfl:    hydrochlorothiazide (MICROZIDE) 12.5 MG capsule, Take 12.5 mg by mouth daily., Disp: , Rfl:    hydrocortisone (ANUSOL-HC) 2.5 % rectal cream, Place 1 application. rectally 2 (two) times daily. (Patient taking differently: Place 1 application  rectally 2 (two) times daily as needed for hemorrhoids or anal itching.), Disp: 30 g, Rfl: 1   loratadine (CLARITIN) 10 MG tablet, Take 10 mg by mouth daily as needed for allergies., Disp: , Rfl:    losartan (COZAAR) 50 MG tablet, Take 50 mg by mouth every evening., Disp: , Rfl:    melatonin 5 MG TABS, Take 5 mg by mouth at bedtime as needed (sleep)., Disp: , Rfl:    montelukast (SINGULAIR) 10 MG tablet, Take 10 mg by mouth at bedtime., Disp: , Rfl:    polyvinyl alcohol (LIQUIFILM TEARS) 1.4 % ophthalmic solution, Place 2 drops into both eyes as needed for dry eyes., Disp: , Rfl:    Vitamin D, Ergocalciferol, (DRISDOL) 1.25 MG (50000 UNIT) CAPS capsule, Take 50,000 Units by mouth every 7 (seven) days. Tuesday, Disp: , Rfl:   PHYSICAL EXAM: ECOG FS:1 - Symptomatic but completely ambulatory    Vitals:   01/12/22  1110  BP: 132/78  Pulse: 93  Resp: 16  Temp: 97.7 F (36.5 C)  TempSrc: Oral  SpO2: 96%  Weight: 210 lb (95.3 kg)   Physical Exam Vitals and nursing note reviewed.  Constitutional:      Appearance: She is well-developed. She is not ill-appearing or toxic-appearing.  HENT:     Head: Normocephalic.     Nose: Nose normal.  Eyes:     Conjunctiva/sclera: Conjunctivae normal.  Neck:     Vascular: No JVD.  Cardiovascular:     Rate and Rhythm: Normal rate and regular rhythm.     Pulses: Normal pulses.     Heart sounds: Normal heart sounds.  Pulmonary:     Effort: Pulmonary effort is normal. No respiratory distress.     Breath sounds: Normal breath sounds. No stridor. No wheezing, rhonchi or  rales.  Chest:     Chest wall: No tenderness.  Abdominal:     General: There is no distension.  Musculoskeletal:     Cervical back: Normal range of motion.     Right lower leg: Edema present.     Left lower leg: Edema present.     Comments: Pretibial. Non pitting.  Homans sign absent bilaterally, no palpable cords, compartments are soft  Skin:    General: Skin is warm and dry.     Findings: No rash. Rash is not urticarial.     Comments: Equal tactile temperature in bilateral lower extremities.  Neurological:     Mental Status: She is oriented to person, place, and time.        LABORATORY DATA: I have reviewed the data as listed    Latest Ref Rng & Units 01/12/2022   10:11 AM 11/15/2021   11:08 AM 10/25/2021   11:32 AM  CBC  WBC 4.0 - 10.5 K/uL 6.6  5.1  6.3   Hemoglobin 12.0 - 15.0 g/dL 15.3  13.9  14.7   Hematocrit 36.0 - 46.0 % 44.6  41.5  44.3   Platelets 150 - 400 K/uL 209  227  259         Latest Ref Rng & Units 01/12/2022   10:11 AM 11/15/2021   11:08 AM 10/25/2021   11:32 AM  CMP  Glucose 70 - 99 mg/dL 152  111  90   BUN 8 - 23 mg/dL _0 Creatinine 0.44 - 1.00 mg/dL 0.59  0.64  0.81   Sodium 135 - 145 mmol/L 137  138  136   Potassium 3.5 - 5.1 mmol/L 3.5  3.4  3.6   Chloride 98 - 111 mmol/L 100  103  101   CO2 22 - 32 mmol/L _1 Calcium 8.9 - 10.3 mg/dL 9.4  9.3  9.7   Total Protein 6.5 - 8.1 g/dL 7.2  6.5  6.9   Total Bilirubin 0.3 - 1.2 mg/dL 0.5  0.3  0.4   Alkaline Phos 38 - 126 U/L 83  79  75   AST 15 - 41 U/L _2 ALT 0 - 44 U/L _3 RADIOGRAPHIC STUDIES (from last 24 hours if applicable) I have personally reviewed the radiological images as listed and agreed with the findings in the report. No results found.      Visit Diagnosis: 1. Leg swelling   2. Malignant neoplasm of central portion of right breast  in female, estrogen receptor negative ([place])      Orders Placed This Encounter  Procedures    TSH    Standing Status:   Future    Number of Occurrences:   1    Standing Expiration Date:   01/12/2023   T3    Standing Status:   Future    Number of Occurrences:   1    Standing Expiration Date:   01/12/2023   T4, free    Standing Status:   Future    Number of Occurrences:   1    Standing Expiration Date:   01/12/2023    All questions were answered. The patient knows to call the clinic with any problems, questions or concerns. No barriers to learning was detected.  I have spent a total of 20 minutes minutes of face-to-face and non-face-to-face time, preparing to see the patient, obtaining and/or reviewing separately obtained history, performing a medically appropriate examination, counseling and educating the patient, ordering tests, documenting clinical information in the electronic health record, and care coordination (communications with other health care professionals or caregivers).    Thank you for allowing me to participate in the care of this patient.    Barrie Folk, PA-C Department of Hematology/Oncology [facility] at [facility] Phone: [phone]  [phone]    01/12/2022 3:54 PM

## 2022-01-14 LAB — T3: T3, Total: 153 ng/dL (ref 71–180)

## 2022-01-18 ENCOUNTER — Other Ambulatory Visit: Payer: Self-pay

## 2022-01-18 ENCOUNTER — Telehealth: Payer: Self-pay | Admitting: Radiation Oncology

## 2022-01-18 ENCOUNTER — Telehealth: Payer: Self-pay

## 2022-01-18 NOTE — Telephone Encounter (Signed)
Received a call from patient stating that she is having skin irration to right chest wall. Patient completed 25 treatments to right chest wall on 01/12/22. Patient states her skin is red, warm and weeping. Denies swelling to area.  Patient states drainage is a "gray", also reports when she apply's radiaplex it burns her  skin severely. Patient applying antibiotic ointment to open areas to right chest wall and axilla. Patient questioning if she could use aloe with lidocaine to areas of skin that have not broken down? Please advise.

## 2022-01-18 NOTE — Telephone Encounter (Signed)
Patient called to inform nursing that she is experiencing burning at her treatment site and the lotion stings when she uses it. Informed Marylene Land, LPN and she will return the patients call.

## 2022-01-18 NOTE — Telephone Encounter (Signed)
Prescription called in to [facility] for Silvadene. Called patient to make aware of new medication and reviewed instructions per Dr. Isidore Moos. Patient voiced understanding. Patient to call back if no improvement with skin.

## 2022-01-23 ENCOUNTER — Encounter: Payer: Self-pay | Admitting: Hematology and Oncology

## 2022-01-23 NOTE — Progress Notes (Signed)
  Patient Name: Ann Maxwell MRN: [id number] DOB: Aug 20, 1940 Referring Physician: Nicholas Lose (Profile Not Attached) Date of Service: 01/12/2022 [facility] [place]                                                        End Of Treatment Note  Diagnoses: C50.111-Malignant neoplasm of central portion of right female breast  Cancer Staging:  Cancer Staging  Cancer of central portion of right breast [facility]) Staging form: Breast, AJCC 8th Edition - Pathologic stage from 11/08/2021: Stage IIIA (pT2, pN1, cM0, G3, ER-, PR-, HER2-) - Signed by Eppie Gibson, MD on 11/08/2021 Stage prefix: Initial diagnosis Histologic grading system: 3 grade system  Malignant neoplasm of central portion of right breast in female, estrogen receptor negative ([place]) Staging form: Breast, AJCC 8th Edition - Clinical: Stage IB (cT1c, cN0, cM0, G3, ER-, PR-, HER2-) - Signed by Nicholas Lose, MD on 06/01/2021 Histologic grading system: 3 grade system - Pathologic stage from 06/23/2021: Stage IIIA (pT2, pN1a, cM0, G3, ER-, PR-, HER2-) - Signed by Gardenia Phlegm, NP on 11/15/2021 Stage prefix: Initial diagnosis Histologic grading system: 3 grade system  Primary cancer of lower outer quadrant of left female breast ([place]) Staging form: Breast, AJCC 7th Edition - Clinical stage from 12/20/2011: Stage 0 (Tis, N0, cM0) - Unsigned Staged by: Pathologist and managing physician Specimen type: Core Needle Biopsy Histopathologic type: 9932 Laterality: Left - Pathologic: No stage assigned - Unsigned Specimen type: Core Needle Biopsy Histopathologic type: 9932 Laterality: Left  Primary colon cancer ([place]) Staging form: Colon and Rectum, AJCC 7th Edition - Pathologic: Stage IIA (T3, N0, cM0) - Signed by Rulon Eisenmenger, MD on 06/15/2014 Laterality: Right Tumor size (mm): 94 Histologic grade (G): G2 Lymph-vascular invasion (LVI): LVI not  present (absent)/not identified Residual tumor (R): R0 - None Tumor deposits (TD): Absent Perineural invasion (PNI): Absent KRAS gene analysis: Not assessed Stage used in treatment planning: Yes National guidelines used in treatment planning: Yes Type of national guideline used in treatment planning: NCCN    Intent: Curative  Radiation Treatment Dates: 12/08/2021 through 01/12/2022 Site Technique Total Dose (Gy) Dose per Fx (Gy) Completed Fx Beam Energies  Chest Wall, Right: CW_R_IMN 3D 50/50 2 25/25 10X  Chest Wall, Right: CW_R_PAB_SCV 3D 50/50 2 25/25 6X, 10X   Narrative: The patient tolerated radiation therapy relatively well.   Plan: The patient will follow-up with radiation oncology in 79mo. -----------------------------------  SEppie Gibson MD

## 2022-01-30 ENCOUNTER — Telehealth: Payer: Self-pay | Admitting: *Deleted

## 2022-01-30 DIAGNOSIS — Z23 Encounter for immunization: Secondary | ICD-10-CM | POA: Diagnosis not present

## 2022-01-30 NOTE — Telephone Encounter (Signed)
Pt called with concerns about receiving Covid vaccine in either arm due to lymph nodes being removed. Per Dr.Gudena, advised pt to have vaccine administered in right arm where less lymph nodes were removed. Also advised pt if she notices any swelling in arm to call and she will be referred to the lymphedema clinic. Pt verbalized understanding but was not satisfied.

## 2022-02-14 ENCOUNTER — Telehealth: Payer: Self-pay

## 2022-02-14 ENCOUNTER — Encounter: Payer: Self-pay | Admitting: Hematology and Oncology

## 2022-02-14 NOTE — Telephone Encounter (Signed)
Rn called to obtain update on pt current condition. She stated she had no areas of concern other than intermittent fatigue (which we explained was normal and would improve). She denied pain or skin concerns. Rn encouraged pt to reach out to [place] with any questions or concerns.

## 2022-02-14 NOTE — Progress Notes (Signed)
I called the patient today about her upcoming follow-up appointment in radiation oncology.   Given the state of the COVID-19 pandemic, concerning case numbers in our community, and guidance from [facility] [facility], I offered a phone assessment with the patient to determine if coming to the clinic was necessary. She accepted.  The patient denies any symptomatic concerns.  Specifically, they report good healing of their skin in the radiation fields.  Skin is intact.    I recommended that she continue skin care by applying oil or lotion with vitamin E to the skin in the radiation fields, BID, for 2 more months.  Continue follow-up with medical oncology - follow-up is scheduled on 03-31-22 with Wilber Bihari for survivorship.  I explained that yearly physical exams are important for patients and physical exams are important after mastectomy for patients that cannot undergo mammography.  I encouraged her to call if she had further questions or concerns about her healing. Otherwise, she will follow-up PRN in radiation oncology. Patient is pleased with this plan, and we will cancel her upcoming follow-up to reduce the risk of COVID-19 transmission.

## 2022-02-15 ENCOUNTER — Ambulatory Visit
Admission: RE | Admit: 2022-02-15 | Discharge: 2022-02-15 | Disposition: A | Discharge status: Home / Self Care | Payer: Medicare PPO | Source: Ambulatory Visit | Attending: Radiation Oncology | Admitting: Radiation Oncology

## 2022-02-15 DIAGNOSIS — Z171 Estrogen receptor negative status [ER-]: Secondary | ICD-10-CM

## 2022-02-21 ENCOUNTER — Other Ambulatory Visit: Payer: Self-pay | Admitting: Hematology and Oncology

## 2022-03-16 DIAGNOSIS — M1711 Unilateral primary osteoarthritis, right knee: Secondary | ICD-10-CM | POA: Diagnosis not present

## 2022-03-31 ENCOUNTER — Other Ambulatory Visit: Payer: Self-pay

## 2022-03-31 ENCOUNTER — Encounter: Payer: Self-pay | Admitting: Adult Health

## 2022-03-31 ENCOUNTER — Inpatient Hospital Stay: Payer: Medicare PPO | Attending: Radiation Oncology | Admitting: Adult Health

## 2022-03-31 VITALS — BP 122/80 | HR 93 | Temp 97.5°F | Resp 16 | Ht 63.5 in | Wt 206.5 lb

## 2022-03-31 DIAGNOSIS — C50512 Malignant neoplasm of lower-outer quadrant of left female breast: Secondary | ICD-10-CM | POA: Diagnosis not present

## 2022-03-31 DIAGNOSIS — Z9013 Acquired absence of bilateral breasts and nipples: Secondary | ICD-10-CM | POA: Insufficient documentation

## 2022-03-31 DIAGNOSIS — Z923 Personal history of irradiation: Secondary | ICD-10-CM | POA: Diagnosis not present

## 2022-03-31 DIAGNOSIS — R918 Other nonspecific abnormal finding of lung field: Secondary | ICD-10-CM | POA: Insufficient documentation

## 2022-03-31 DIAGNOSIS — L03811 Cellulitis of head [any part, except face]: Secondary | ICD-10-CM | POA: Diagnosis not present

## 2022-03-31 DIAGNOSIS — R911 Solitary pulmonary nodule: Secondary | ICD-10-CM | POA: Diagnosis not present

## 2022-03-31 DIAGNOSIS — C189 Malignant neoplasm of colon, unspecified: Secondary | ICD-10-CM | POA: Diagnosis not present

## 2022-03-31 DIAGNOSIS — C50111 Malignant neoplasm of central portion of right female breast: Secondary | ICD-10-CM | POA: Diagnosis not present

## 2022-03-31 DIAGNOSIS — Z171 Estrogen receptor negative status [ER-]: Secondary | ICD-10-CM | POA: Diagnosis not present

## 2022-03-31 MED ORDER — AMOXICILLIN-POT CLAVULANATE 875-125 MG PO TABS: 1.0000 | ORAL_TABLET | Freq: Two times a day (BID) | ORAL | 0 refills | Status: DC

## 2022-04-03 NOTE — Progress Notes (Signed)
SURVIVORSHIP VISIT:   BRIEF ONCOLOGIC HISTORY:  Oncology History  Primary cancer of lower outer quadrant of left female breast ([place])  01/04/2012 Surgery   Left mastectomy: Invasive ductal carcinoma 0.5 cm with high-grade DCIS 9 cm, margins negative, 0/6 lymph nodes, ER 100%, PR 100%, Ki-67 17%, HER-2 negative ratio 1.16   02/07/2012 - 05/28/2016 Anti-estrogen oral therapy   Anastrozole 1 mg daily (stopped early due to Osteoporosis)   06/01/2021 Initial Diagnosis   Left breast cancer invasive ductal carcinoma: 0.5 cm with 9 mm area of DCIS status post left mastectomy 01/04/2012 ER/PR positive HER-2 negative Ki-67 17% currently on Arimidex 1 mg daily  Since 02/07/2012.  Completed 7 years of October 2020  (cecal adenocarcinoma diagnosed 04/22/2013, preop CEA of 5.1, status post right hemicolectomy 04/28/2014, grade 2, no MVI, 0/25 lymph nodes, subserosal involvement, T3 N0 M0 stage II A, no mismatch repair)   06/23/2021 Surgery   Right mastectomy: Grade 3 IDC with DCIS, 3.4 cm, margins negative, 2/3 lymph nodes positive ER 0%, PR 0%, HER2 negative, Ki-67 20%   Primary colon cancer ([place])  03/20/2014 Initial Diagnosis   Invasive adenocarcinoma of [place]   04/28/2014 Surgery   Right hemicolectomy: Invasive adenocarcinoma with mucinous features moderately differentiated invading subserosal tissue, margins negative, 25 lymph nodes negative, T3 N0 M0 stage II a, no microsatellite instability   Malignant neoplasm of central portion of right breast in female, estrogen receptor negative ([place])  06/01/2021 Initial Diagnosis   Left breast cancer invasive ductal carcinoma: 0.5 cm with 9 mm area of DCIS status post left mastectomy 01/04/2012 ER/PR positive HER-2 negative Ki-67 17% currently on Arimidex 1 mg daily  Since 02/07/2012.  Completed 7 years of October 2020  (cecal adenocarcinoma diagnosed 04/22/2013, preop CEA of 5.1, status post right hemicolectomy 04/28/2014, grade 2, no MVI, 0/25 lymph nodes,  subserosal involvement, T3 N0 M0 stage II A, no mismatch repair)   06/01/2021 Cancer Staging   Staging form: Breast, AJCC 8th Edition - Clinical: Stage IB (cT1c, cN0, cM0, G3, ER-, PR-, HER2-) - Signed by Nicholas Lose, MD on 06/01/2021 Histologic grading system: 3 grade system   06/08/2021 Imaging   CT CAP and bone scan: 06/08/2021: Multiple small lung nodules stable with the exception of new 4 mm left lower lobe lung nodule and a 6 mm right lower lobe lung nodule open (nonspecific)   06/23/2021 Surgery   06/23/2021:Right mastectomy: Grade 3 IDC with DCIS, 3.4 cm, margins negative, 2/3 lymph nodes positive ER 0%, PR 0%, HER2 negative, Ki-67 20%   06/23/2021 Cancer Staging   Staging form: Breast, AJCC 8th Edition - Pathologic stage from 06/23/2021: Stage IIIA (pT2, pN1a, cM0, G3, ER-, PR-, HER2-) - Signed by Gardenia Phlegm, NP on 11/15/2021 Stage prefix: Initial diagnosis Histologic grading system: 3 grade system   08/02/2021 - 11/15/2021 Adjuvant Chemotherapy   CMF every 21 days x 6 cycles   12/08/2021 - 01/12/2022 Radiation Therapy   Site Technique Total Dose (Gy) Dose per Fx (Gy) Completed Fx Beam Energies  Chest Wall, Right: CW_R_IMN 3D 50/50 2 25/25 10X  Chest Wall, Right: CW_R_PAB_SCV 3D 50/50 2 25/25 6X, 10X     Cancer of central portion of right breast ([place])  06/23/2021 Initial Diagnosis   Cancer of central portion of right breast ([place])   11/08/2021 Cancer Staging   Staging form: Breast, AJCC 8th Edition - Pathologic stage from 11/08/2021: Stage IIIA (pT2, pN1, cM0, G3, ER-, PR-, HER2-) - Signed by Eppie Gibson, MD on 11/08/2021  Stage prefix: Initial diagnosis Histologic grading system: 3 grade system   12/08/2021 - 01/12/2022 Radiation Therapy   Site Technique Total Dose (Gy) Dose per Fx (Gy) Completed Fx Beam Energies  Chest Wall, Right: CW_R_IMN 3D 50/50 2 25/25 10X  Chest Wall, Right: CW_R_PAB_SCV 3D 50/50 2 25/25 6X, 10X       INTERVAL HISTORY:  Ms. Donald to review her  survivorship care plan detailing her treatment course for breast cancer, as well as monitoring long-term side effects of that treatment, education regarding health maintenance, screening, and overall wellness and health promotion.     Overall, Ms. Garinger reports feeling quite well that she is feeling well.  She has completed adjuvant radiation therapy.  She experiences intermittent fatigue.  She has some skin issues that she is concerned about, in particular an area on her scalp that feels swollen and painful she scratched, along with an area on her knee.  For her intermittent knee discomfort she took celebrex which helped.    REVIEW OF SYSTEMS:  Review of Systems  Constitutional:  Positive for fatigue. Negative for appetite change, chills, fever and unexpected weight change.  HENT:   Negative for hearing loss, lump/mass and trouble swallowing.   Eyes:  Negative for eye problems and icterus.  Respiratory:  Negative for chest tightness, cough and shortness of breath.   Cardiovascular:  Negative for chest pain, leg swelling and palpitations.  Gastrointestinal:  Negative for abdominal distention, abdominal pain, constipation, diarrhea, nausea and vomiting.  Endocrine: Negative for hot flashes.  Genitourinary:  Negative for difficulty urinating.   Musculoskeletal:  Positive for arthralgias.  Skin:  Negative for itching and rash.  Neurological:  Negative for dizziness, extremity weakness, headaches and numbness.  Hematological:  Negative for adenopathy. Does not bruise/bleed easily.  Psychiatric/Behavioral:  Negative for depression. The patient is not nervous/anxious.   Breast: Denies any new nodularity, masses, tenderness, nipple changes, or nipple discharge.    PAST MEDICAL/SURGICAL HISTORY:  Past Medical History:  Diagnosis Date   Allergy    Anemia    iron infusion -last done 1 month ago(12-15, and 12-22 -15 [facility])- Dr. Lindi Adie   Anxiety    Asthma    Breast CA [facility]) 2013   a. L breast DCIS,  s/p L mastectomy 12/2011. no further issues   Breast cancer ([place]) 2023   Right breast IDC   Cataract    bilat removed    Colon cancer ([place])    Depression    pt denies any hx of depression    Environmental allergies    History of kidney stones    Hypertension    controlled on meds    Internal hemorrhoids    remains an issue- no bright bleeding seen recent   Iron deficiency anemia    Obesity    Osteoarthritis    arthritis- knees and shoulders   Osteoporosis    drug induced and on Fosamax   Port-A-Cath in place 08/01/2021   Transfusion history    2 yrs ago due to anemia   Vertigo    no recent issues   Past Surgical History:  Procedure Laterality Date   BREAST BIOPSY Right 05/27/2021   U/S Bx   BREAST SURGERY     CATARACT EXTRACTION, BILATERAL  2019   CHOLECYSTECTOMY     COLON RESECTION N/A 04/28/2014   Procedure: LAPAROSCOPIC  RIGHT HEMI-COLECTOMY;  Surgeon: Stark Klein, MD;  Location: [facility];  Service: General;  Laterality: N/A;   COLONOSCOPY  Lithectomy  2010   LITHOTRIPSY     MASTECTOMY Left 2013   MASTECTOMY W/ SENTINEL NODE BIOPSY  01/04/2012   Procedure: MASTECTOMY WITH SENTINEL LYMPH NODE BIOPSY;  Surgeon: Stark Klein, MD;  Location: [place];  Service: General;  Laterality: Left;   ORIF TIBIA & FIBULA FRACTURES     wtih subsequent pin removal in 1997   ORIF ULNAR / RADIAL SHAFT FRACTURE Left    POLYPECTOMY     PORT-A-CATH REMOVAL Left 12/01/2021   Procedure: REMOVAL PORT-A-CATH;  Surgeon: Stark Klein, MD;  Location: [place];  Service: General;  Laterality: Left;   PORTACATH PLACEMENT Left 06/23/2021   Procedure: INSERTION PORT-A-CATH;  Surgeon: Stark Klein, MD;  Location: [place];  Service: General;  Laterality: Left;   SENTINEL NODE BIOPSY Right 06/23/2021   Procedure: SENTINEL LYMPH NODE BIOPSY;  Surgeon: Stark Klein, MD;  Location: [place];  Service: General;  Laterality: Right;   SIMPLE MASTECTOMY WITH AXILLARY SENTINEL  NODE BIOPSY Right 06/23/2021   Procedure: RIGHT MASTECTOMY;  Surgeon: Stark Klein, MD;  Location: [place];  Service: General;  Laterality: Right;   TONSILLECTOMY AND ADENOIDECTOMY     TOTAL KNEE ARTHROPLASTY Left 08/23/2015   Procedure: TOTAL KNEE ARTHROPLASTY;  Surgeon: Gaynelle Arabian, MD;  Location: [facility];  Service: Orthopedics;  Laterality: Left;   VAGINAL HYSTERECTOMY  20 yrs. ago   with BSO     ALLERGIES:  Allergies  Allergen Reactions   Other Shortness Of Breath and Other (See Comments)    Tree and shrub pollen    Pollen Extract Other (See Comments)   Tape Other (See Comments) and Rash    Surgical tape causes blisters   Tetracycline Other (See Comments)   Tyloxapol Nausea And Vomiting    Tylox    Terramycin [Oxytetracycline] Rash     CURRENT MEDICATIONS:  Outpatient Encounter Medications as of 03/31/2022  Medication Sig   acetaminophen (TYLENOL) 650 MG CR tablet Take 650 mg by mouth every 8 (eight) hours as needed for pain.   ADVAIR DISKUS 250-50 MCG/DOSE AEPB Inhale 1 puff into the lungs 2 (two) times daily.   albuterol (VENTOLIN HFA) 108 (90 Base) MCG/ACT inhaler Inhale 2 puffs into the lungs every 6 (six) hours as needed for wheezing or shortness of breath.   alendronate (FOSAMAX) 70 MG tablet Take 1 tablet (70 mg total) by mouth once a week. Take with a full glass of water on an empty stomach. (Patient taking differently: Take 70 mg by mouth once a week. Take with a full glass of water on an empty stomach. On Saturday)   amoxicillin-clavulanate (AUGMENTIN) 875-125 MG tablet Take 1 tablet by mouth 2 (two) times daily.   celecoxib (CELEBREX) 200 MG capsule Take 200 mg by mouth daily as needed for mild pain.   gabapentin (NEURONTIN) 100 MG capsule Take 1 capsule (100 mg total) by mouth 3 (three) times daily. (Patient taking differently: Take 100 mg by mouth at bedtime.)   hydrochlorothiazide (MICROZIDE) 12.5 MG capsule Take 12.5 mg by mouth daily.    hydrocortisone (ANUSOL-HC) 2.5 % rectal cream USE 1 application rectally 2 TIMES DAILY   loratadine (CLARITIN) 10 MG tablet Take 10 mg by mouth daily as needed for allergies.   losartan (COZAAR) 50 MG tablet Take 50 mg by mouth every evening.   melatonin 5 MG TABS Take 5 mg by mouth at bedtime as needed (sleep).   montelukast (SINGULAIR) 10 MG tablet Take 10 mg  by mouth at bedtime.   polyvinyl alcohol (LIQUIFILM TEARS) 1.4 % ophthalmic solution Place 2 drops into both eyes as needed for dry eyes.   Vitamin D, Ergocalciferol, (DRISDOL) 1.25 MG (50000 UNIT) CAPS capsule Take 50,000 Units by mouth every 7 (seven) days. Tuesday   No facility-administered encounter medications on file as of 03/31/2022.     ONCOLOGIC FAMILY HISTORY:  Family History  Problem Relation Age of Onset   Congestive Heart Failure Father        Died at 66   Hypertension Father    Heart disease Father    Stroke Father    Ovarian cancer Mother 51       deceased 74   Uterine cancer Maternal Grandmother        late 3s   Liver cancer Paternal Grandfather 36       deceased 62s   Thyroid cancer Sister 79       currently 32   Colon cancer Neg Hx    Stomach cancer Neg Hx    Esophageal cancer Neg Hx    Rectal cancer Neg Hx    Breast cancer Neg Hx    Colon polyps Neg Hx     SOCIAL HISTORY:  Social History   Socioeconomic History   Marital status: Divorced    Spouse name: Not on file   Number of children: Not on file   Years of education: Not on file   Highest education level: Not on file  Occupational History   Occupation: retired Marine scientist  Tobacco Use   Smoking status: Never    Passive exposure: Past   Smokeless tobacco: Never  Vaping Use   Vaping Use: Never used  Substance and Sexual Activity   Alcohol use: Not Currently    Comment: occasionally - few times a year    Drug use: Never   Sexual activity: Not Currently    Birth control/protection: Surgical  Other Topics Concern   Not on file  Social  History Narrative   Retired Sports coach, 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 who is a Marine scientist   Social Determinants of Radio broadcast assistant Strain: Not on file  Food Insecurity: Not on file  Transportation Needs: Not on file  Physical Activity: Not on file  Stress: Not on file  Social Connections: Not on file  Intimate Partner Violence: Not on file     OBSERVATIONS/OBJECTIVE:  BP 122/80 (BP Location: Left Arm, Patient Position: Sitting)   Pulse 93   Temp (!) 97.5 F (36.4 C) (Temporal)   Resp 16   Ht 5' 3.5" (1.613 m)   Wt 206 lb 8 oz (93.7 kg)   SpO2 95%   BMI 36.01 kg/m  GENERAL: Patient is a well appearing female in no acute distress HEENT:  Sclerae anicteric.  Oropharynx clear and moist. No ulcerations or evidence of oropharyngeal candidiasis. Neck is supple.  NODES:  No cervical, supraclavicular, or axillary lymphadenopathy palpated.  BREAST EXAM:  right breast s/p mastectomy and radiation, no sign of local recurrence, left breast s/p mastectomy, no sign of local recurrence.  LUNGS:  Clear to auscultation bilaterally.  No wheezes or rhonchi. HEART:  Regular rate and rhythm. No murmur appreciated. ABDOMEN:  Soft, nontender.  Positive, normoactive bowel sounds. No organomegaly palpated. MSK:  No focal spinal tenderness to palpation. Full range of motion bilaterally in the upper extremities. EXTREMITIES:  No peripheral edema.   SKIN:  Scalp with + erythema, tenderness, swelling, and warmth NEURO:  Nonfocal. Well oriented.  Appropriate affect.   LABORATORY DATA:  None for this visit.  DIAGNOSTIC IMAGING:  None for this visit.     ASSESSMENT AND PLAN:  Ms.. Delavega is a pleasant 81 y.o. female with Stage IIIA right breast invasive ductal carcinoma, ER-/PR-/HER2-, diagnosed in 05/2021, treated with mastectomy, adjuvant chemotherapy, and adjuvant radiation therapy.  She presents to the Survivorship Clinic for our initial meeting and routine follow-up post-completion of treatment for  breast cancer.    1. Stage IIIA right breast cancer:  Ms. Fullman is continuing to recover from definitive treatment for breast cancer. She will follow-up with her medical oncologist, Dr. Lindi Adie in 3-6 months with history and physical exam per surveillance protocol.  In her staging scans at diagnosis she had a 6 mm lung nodule that requires repeat CT scan f/u.  I placed orders for this today. Today, a comprehensive survivorship care plan and treatment summary was reviewed with the patient today detailing her breast cancer diagnosis, treatment course, potential late/long-term effects of treatment, appropriate follow-up care with recommendations for the future, and patient education resources.  A copy of this summary, along with a letter will be sent to the patient's primary care provider via mail/fax/In Basket message after today's visit.    2. Scalp Cellulitis: Her scalp appears infected.  I prescribed augmentin for her to take BID x 10 days for this.  She knows to call if it worsens, or doesn't improve after 2-3 days.    3. Bone health:  She will continue to f/u with her PCP about her bone density testing.  She was given education on specific activities to promote bone health.  4. Cancer screening:  Due to Ms. Dreisbach's history and her age, she should receive screening for skin cancers.  The information and recommendations are listed on the patient's comprehensive care plan/treatment summary and were reviewed in detail with the patient.    5. Health maintenance and wellness promotion: Ms. Helming was encouraged to consume 5-7 servings of fruits and vegetables per day. We reviewed the "Nutrition Rainbow" handout.  She was also encouraged to engage in moderate to vigorous exercise for 30 minutes per day most days of the week. We discussed the [employer] fitness program, which is designed for cancer survivors to help them become more physically fit after cancer treatments.  She was instructed to limit her  alcohol consumption and continue to abstain from tobacco use.     6. Support services/counseling: It is not uncommon for this period of the patient's cancer care trajectory to be one of many emotions and stressors. She was given information regarding our available services and encouraged to contact me with any questions or for help enrolling in any of our support group/programs.    Follow up instructions:    -Return to cancer center in 3-6 months for f/u with Dr. Lindi Adie -Repeat CT chest to f/u on lung nodules ordered -She is welcome to return back to the Survivorship Clinic at any time; no additional follow-up needed at this time.  -Consider referral back to survivorship as a long-term survivor for continued surveillance  The patient was provided an opportunity to ask questions and all were answered. The patient agreed with the plan and demonstrated an understanding of the instructions.   Total encounter time:40 minutes*in face-to-face visit time, chart review, lab review, care coordination, order entry, and documentation of the encounter time.    Wilber Bihari, NP 04/03/22 10:59 PM Medical Oncology and Hematology [facility] [address], [place] [address] Tel. [phone]    Fax. [phone]  *Total Encounter Time as defined by the Centers for Medicare and Medicaid Services includes, in addition to the face-to-face time of a patient visit (documented in the note above) non-face-to-face time: obtaining and reviewing outside history, ordering and reviewing medications, tests or procedures, care coordination (communications with other health care professionals or caregivers) and documentation in the medical record.

## 2022-04-13 ENCOUNTER — Telehealth: Payer: Self-pay | Admitting: Hematology and Oncology

## 2022-04-13 NOTE — Telephone Encounter (Signed)
Patient called to schedule scan. Transferred patient to scheduling line

## 2022-04-19 DIAGNOSIS — I700 Atherosclerosis of aorta: Secondary | ICD-10-CM | POA: Diagnosis not present

## 2022-04-19 DIAGNOSIS — Z1211 Encounter for screening for malignant neoplasm of colon: Secondary | ICD-10-CM | POA: Diagnosis not present

## 2022-04-19 DIAGNOSIS — D509 Iron deficiency anemia, unspecified: Secondary | ICD-10-CM | POA: Diagnosis not present

## 2022-04-19 DIAGNOSIS — M810 Age-related osteoporosis without current pathological fracture: Secondary | ICD-10-CM | POA: Diagnosis not present

## 2022-04-19 DIAGNOSIS — R7989 Other specified abnormal findings of blood chemistry: Secondary | ICD-10-CM | POA: Diagnosis not present

## 2022-04-19 DIAGNOSIS — D649 Anemia, unspecified: Secondary | ICD-10-CM | POA: Diagnosis not present

## 2022-04-19 DIAGNOSIS — E785 Hyperlipidemia, unspecified: Secondary | ICD-10-CM | POA: Diagnosis not present

## 2022-04-23 DIAGNOSIS — D649 Anemia, unspecified: Secondary | ICD-10-CM | POA: Diagnosis not present

## 2022-04-23 DIAGNOSIS — Z1211 Encounter for screening for malignant neoplasm of colon: Secondary | ICD-10-CM | POA: Diagnosis not present

## 2022-04-24 DIAGNOSIS — M810 Age-related osteoporosis without current pathological fracture: Secondary | ICD-10-CM | POA: Diagnosis not present

## 2022-04-24 DIAGNOSIS — Z1331 Encounter for screening for depression: Secondary | ICD-10-CM | POA: Diagnosis not present

## 2022-04-24 DIAGNOSIS — M25562 Pain in left knee: Secondary | ICD-10-CM | POA: Diagnosis not present

## 2022-04-24 DIAGNOSIS — I700 Atherosclerosis of aorta: Secondary | ICD-10-CM | POA: Diagnosis not present

## 2022-04-24 DIAGNOSIS — J45909 Unspecified asthma, uncomplicated: Secondary | ICD-10-CM | POA: Diagnosis not present

## 2022-04-24 DIAGNOSIS — Z1339 Encounter for screening examination for other mental health and behavioral disorders: Secondary | ICD-10-CM | POA: Diagnosis not present

## 2022-04-24 DIAGNOSIS — E669 Obesity, unspecified: Secondary | ICD-10-CM | POA: Diagnosis not present

## 2022-04-24 DIAGNOSIS — I10 Essential (primary) hypertension: Secondary | ICD-10-CM | POA: Diagnosis not present

## 2022-04-24 DIAGNOSIS — R82998 Other abnormal findings in urine: Secondary | ICD-10-CM | POA: Diagnosis not present

## 2022-04-24 DIAGNOSIS — E785 Hyperlipidemia, unspecified: Secondary | ICD-10-CM | POA: Diagnosis not present

## 2022-04-24 DIAGNOSIS — Z0000 Encounter for general adult medical examination without abnormal findings: Secondary | ICD-10-CM | POA: Diagnosis not present

## 2022-05-01 ENCOUNTER — Ambulatory Visit (HOSPITAL_COMMUNITY)
Admission: RE | Admit: 2022-05-01 | Discharge: 2022-05-01 | Disposition: A | Discharge status: Home / Self Care | Payer: Medicare PPO | Source: Ambulatory Visit | Attending: Adult Health | Admitting: Adult Health

## 2022-05-01 DIAGNOSIS — R918 Other nonspecific abnormal finding of lung field: Secondary | ICD-10-CM | POA: Diagnosis not present

## 2022-05-01 DIAGNOSIS — Z171 Estrogen receptor negative status [ER-]: Secondary | ICD-10-CM | POA: Diagnosis not present

## 2022-05-01 DIAGNOSIS — C50512 Malignant neoplasm of lower-outer quadrant of left female breast: Secondary | ICD-10-CM | POA: Insufficient documentation

## 2022-05-01 DIAGNOSIS — C50911 Malignant neoplasm of unspecified site of right female breast: Secondary | ICD-10-CM | POA: Diagnosis not present

## 2022-05-01 DIAGNOSIS — R911 Solitary pulmonary nodule: Secondary | ICD-10-CM | POA: Insufficient documentation

## 2022-05-01 DIAGNOSIS — C50111 Malignant neoplasm of central portion of right female breast: Secondary | ICD-10-CM | POA: Diagnosis not present

## 2022-05-01 DIAGNOSIS — C189 Malignant neoplasm of colon, unspecified: Secondary | ICD-10-CM | POA: Insufficient documentation

## 2022-05-01 MED ORDER — IOHEXOL 300 MG/ML  SOLN
75.0000 mL | Freq: Once | INTRAMUSCULAR | Status: AC | PRN
  Administered 2022-05-01: 75 mL via INTRAVENOUS

## 2022-05-01 MED ORDER — SODIUM CHLORIDE (PF) 0.9 % IJ SOLN
INTRAMUSCULAR | Status: AC
  Filled 2022-05-01: qty 50

## 2022-05-02 ENCOUNTER — Telehealth: Payer: Self-pay | Admitting: *Deleted

## 2022-05-02 ENCOUNTER — Other Ambulatory Visit: Payer: Self-pay | Admitting: *Deleted

## 2022-05-02 DIAGNOSIS — C50512 Malignant neoplasm of lower-outer quadrant of left female breast: Secondary | ICD-10-CM

## 2022-05-02 DIAGNOSIS — R911 Solitary pulmonary nodule: Secondary | ICD-10-CM

## 2022-05-02 DIAGNOSIS — Z171 Estrogen receptor negative status [ER-]: Secondary | ICD-10-CM

## 2022-05-02 DIAGNOSIS — C189 Malignant neoplasm of colon, unspecified: Secondary | ICD-10-CM

## 2022-05-02 NOTE — Telephone Encounter (Addendum)
-----  Message from Gardenia Phlegm, NP sent at 05/02/2022  8:39 AM EST ----- Please let patient know that her CT scan is stable.  There were some changes related to the radiation and we will repeat CT chest in June prior to her visit with Dr. Lindi Adie as follow-up.  At that time she will likely be able to stop undergoing CT chest.  Contacted patient with message above. Patient verbalized understanding.

## 2022-05-08 DIAGNOSIS — L218 Other seborrheic dermatitis: Secondary | ICD-10-CM | POA: Diagnosis not present

## 2022-05-08 DIAGNOSIS — D692 Other nonthrombocytopenic purpura: Secondary | ICD-10-CM | POA: Diagnosis not present

## 2022-05-08 DIAGNOSIS — L821 Other seborrheic keratosis: Secondary | ICD-10-CM | POA: Diagnosis not present

## 2022-05-08 DIAGNOSIS — L820 Inflamed seborrheic keratosis: Secondary | ICD-10-CM | POA: Diagnosis not present

## 2022-06-08 DIAGNOSIS — Z96652 Presence of left artificial knee joint: Secondary | ICD-10-CM | POA: Diagnosis not present

## 2022-06-08 DIAGNOSIS — M1711 Unilateral primary osteoarthritis, right knee: Secondary | ICD-10-CM | POA: Diagnosis not present

## 2022-06-27 DIAGNOSIS — M25561 Pain in right knee: Secondary | ICD-10-CM | POA: Diagnosis not present

## 2022-07-03 DIAGNOSIS — Z85038 Personal history of other malignant neoplasm of large intestine: Secondary | ICD-10-CM | POA: Diagnosis not present

## 2022-07-03 DIAGNOSIS — R918 Other nonspecific abnormal finding of lung field: Secondary | ICD-10-CM | POA: Diagnosis not present

## 2022-07-03 DIAGNOSIS — Z171 Estrogen receptor negative status [ER-]: Secondary | ICD-10-CM | POA: Diagnosis not present

## 2022-07-03 DIAGNOSIS — Z853 Personal history of malignant neoplasm of breast: Secondary | ICD-10-CM | POA: Diagnosis not present

## 2022-07-03 DIAGNOSIS — C50111 Malignant neoplasm of central portion of right female breast: Secondary | ICD-10-CM | POA: Diagnosis not present

## 2022-09-13 ENCOUNTER — Encounter (HOSPITAL_COMMUNITY): Payer: Self-pay

## 2022-09-18 NOTE — Progress Notes (Signed)
Patient Care Team: Garlan Fillers, MD as PCP - General (Internal Medicine) Serena Croissant, MD as Consulting Physician (Hematology and Oncology) Lonie Peak, MD as Attending Physician (Radiation Oncology) Almond Lint, MD as Consulting Physician (General Surgery) Ollen Gross, MD as Consulting Physician (Orthopedic Surgery)  DIAGNOSIS:  Encounter Diagnosis  Name Primary?   Primary cancer of lower outer quadrant of left female breast (HCC) Yes    SUMMARY OF ONCOLOGIC HISTORY: Oncology History  Primary cancer of lower outer quadrant of left female breast (HCC)  01/04/2012 Surgery   Left mastectomy: Invasive ductal carcinoma 0.5 cm with high-grade DCIS 9 cm, margins negative, 0/6 lymph nodes, ER 100%, PR 100%, Ki-67 17%, HER-2 negative ratio 1.16   02/07/2012 - 05/28/2016 Anti-estrogen oral therapy   Anastrozole 1 mg daily (stopped early due to Osteoporosis)   06/01/2021 Initial Diagnosis   Left breast cancer invasive ductal carcinoma: 0.5 cm with 9 mm area of DCIS status post left mastectomy 01/04/2012 ER/PR positive HER-2 negative Ki-67 17% currently on Arimidex 1 mg daily  Since 02/07/2012.  Completed 7 years of October 2020  (cecal adenocarcinoma diagnosed 04/22/2013, preop CEA of 5.1, status post right hemicolectomy 04/28/2014, grade 2, no MVI, 0/25 lymph nodes, subserosal involvement, T3 N0 M0 stage II A, no mismatch repair)   06/23/2021 Surgery   Right mastectomy: Grade 3 IDC with DCIS, 3.4 cm, margins negative, 2/3 lymph nodes positive ER 0%, PR 0%, HER2 negative, Ki-67 20%   Primary colon cancer (HCC)  03/20/2014 Initial Diagnosis   Invasive adenocarcinoma of [place]   04/28/2014 Surgery   Right hemicolectomy: Invasive adenocarcinoma with mucinous features moderately differentiated invading subserosal tissue, margins negative, 25 lymph nodes negative, T3 N0 M0 stage II a, no microsatellite instability   Malignant neoplasm of central portion of right breast in female,  estrogen receptor negative (HCC)  06/01/2021 Initial Diagnosis   Left breast cancer invasive ductal carcinoma: 0.5 cm with 9 mm area of DCIS status post left mastectomy 01/04/2012 ER/PR positive HER-2 negative Ki-67 17% currently on Arimidex 1 mg daily  Since 02/07/2012.  Completed 7 years of October 2020  (cecal adenocarcinoma diagnosed 04/22/2013, preop CEA of 5.1, status post right hemicolectomy 04/28/2014, grade 2, no MVI, 0/25 lymph nodes, subserosal involvement, T3 N0 M0 stage II A, no mismatch repair)   06/01/2021 Cancer Staging   Staging form: Breast, AJCC 8th Edition - Clinical: Stage IB (cT1c, cN0, cM0, G3, ER-, PR-, HER2-) - Signed by Serena Croissant, MD on 06/01/2021 Histologic grading system: 3 grade system   06/08/2021 Imaging   CT CAP and bone scan: 06/08/2021: Multiple small lung nodules stable with the exception of new 4 mm left lower lobe lung nodule and a 6 mm right lower lobe lung nodule open (nonspecific)   06/23/2021 Surgery   06/23/2021:Right mastectomy: Grade 3 IDC with DCIS, 3.4 cm, margins negative, 2/3 lymph nodes positive ER 0%, PR 0%, HER2 negative, Ki-67 20%   06/23/2021 Cancer Staging   Staging form: Breast, AJCC 8th Edition - Pathologic stage from 06/23/2021: Stage IIIA (pT2, pN1a, cM0, G3, ER-, PR-, HER2-) - Signed by Loa Socks, NP on 11/15/2021 Stage prefix: Initial diagnosis Histologic grading system: 3 grade system   08/02/2021 - 11/15/2021 Adjuvant Chemotherapy   CMF every 21 days x 6 cycles   12/08/2021 - 01/12/2022 Radiation Therapy   Site Technique Total Dose (Gy) Dose per Fx (Gy) Completed Fx Beam Energies  Chest Wall, Right: CW_R_IMN 3D 50/50 2 25/25 10X  Chest Wall, Right:  CW_R_PAB_SCV 3D 50/50 2 25/25 6X, 10X     Cancer of central portion of right breast (HCC)  06/23/2021 Initial Diagnosis   Cancer of central portion of right breast (HCC)   11/08/2021 Cancer Staging   Staging form: Breast, AJCC 8th Edition - Pathologic stage from 11/08/2021: Stage  IIIA (pT2, pN1, cM0, G3, ER-, PR-, HER2-) - Signed by Lonie Peak, MD on 11/08/2021 Stage prefix: Initial diagnosis Histologic grading system: 3 grade system   12/08/2021 - 01/12/2022 Radiation Therapy   Site Technique Total Dose (Gy) Dose per Fx (Gy) Completed Fx Beam Energies  Chest Wall, Right: CW_R_IMN 3D 50/50 2 25/25 10X  Chest Wall, Right: CW_R_PAB_SCV 3D 50/50 2 25/25 6X, 10X       CHIEF COMPLIANT: Surveillance of breast cancer  INTERVAL HISTORY: Ann Maxwell is a 82 y.o. with above-mentioned history of left breast cancer who is currently on surveillance for breast cancer. She presents to the clinic for a follow-up to review labs and CT chest.      ALLERGIES:  is allergic to other, pollen extract, tape, silvadene [silver sulfadiazine], tetracycline, tyloxapol, and terramycin [oxytetracycline].  MEDICATIONS:  Current Outpatient Medications  Medication Sig Dispense Refill   acetaminophen (TYLENOL) 650 MG CR tablet Take 650 mg by mouth every 8 (eight) hours as needed for pain.     acidophilus (RISAQUAD) CAPS capsule Take 1 capsule by mouth daily.     ADVAIR DISKUS 250-50 MCG/DOSE AEPB Inhale 1 puff into the lungs 2 (two) times daily. 180 each 0   albuterol (VENTOLIN HFA) 108 (90 Base) MCG/ACT inhaler Inhale 2 puffs into the lungs every 6 (six) hours as needed for wheezing or shortness of breath.     alendronate (FOSAMAX) 70 MG tablet Take 1 tablet (70 mg total) by mouth once a week. Take with a full glass of water on an empty stomach. (Patient taking differently: Take 70 mg by mouth every Thursday. Take with a full glass of water on an empty stomach.)     amoxicillin-clavulanate (AUGMENTIN) 875-125 MG tablet Take 1 tablet by mouth 2 (two) times daily. (Patient not taking: Reported on 09/21/2022) 20 tablet 0   CALCIUM + VITAMIN D3 600-5 MG-MCG TABS Take 1 tablet by mouth daily.     cephALEXin (KEFLEX) 500 MG capsule Take 500 mg by mouth every 8 (eight) hours.     gabapentin  (NEURONTIN) 100 MG capsule Take 1 capsule (100 mg total) by mouth 3 (three) times daily. (Patient not taking: Reported on 09/21/2022)     hydrochlorothiazide (MICROZIDE) 12.5 MG capsule Take 12.5 mg by mouth daily.     hydrocortisone (ANUSOL-HC) 2.5 % rectal cream USE 1 application rectally 2 TIMES DAILY (Patient taking differently: Place 1 Application rectally 2 (two) times daily as needed for hemorrhoids or anal itching.) 30 g 1   loratadine (CLARITIN) 10 MG tablet Take 10 mg by mouth daily as needed for allergies.     losartan (COZAAR) 50 MG tablet Take 50 mg by mouth every evening.     melatonin 5 MG TABS Take 5 mg by mouth at bedtime as needed (sleep).     montelukast (SINGULAIR) 10 MG tablet Take 10 mg by mouth at bedtime.     polyvinyl alcohol (LIQUIFILM TEARS) 1.4 % ophthalmic solution Place 2 drops into both eyes as needed for dry eyes.     Vitamin D, Ergocalciferol, (DRISDOL) 1.25 MG (50000 UNIT) CAPS capsule Take 50,000 Units by mouth every Monday.     No  current facility-administered medications for this visit.    PHYSICAL EXAMINATION: ECOG PERFORMANCE STATUS: 1 - Symptomatic but completely ambulatory  Vitals:   10/02/22 1145  BP: 127/69  Pulse: 77  Resp: 18  Temp: (!) 97.5 F (36.4 C)  SpO2: 93%   Filed Weights   10/02/22 1145  Weight: 199 lb 9.6 oz (90.5 kg)    BREAST: No palpable lumps or nodules in bilateral chest wall or axilla (exam performed in the presence of a chaperone)  LABORATORY DATA:  I have reviewed the data as listed    Latest Ref Rng & Units 09/25/2022   10:35 AM 01/12/2022   10:11 AM 11/15/2021   11:08 AM  CMP  Glucose 70 - 99 mg/dL 865  784  696   BUN 8 - 23 mg/dL 14  12  12    Creatinine 0.44 - 1.00 mg/dL 2.95  2.84  1.32   Sodium 135 - 145 mmol/L 136  137  138   Potassium 3.5 - 5.1 mmol/L 3.5  3.5  3.4   Chloride 98 - 111 mmol/L 103  100  103   CO2 22 - 32 mmol/L 25  26  30    Calcium 8.9 - 10.3 mg/dL 9.6  9.4  9.3   Total Protein 6.5 - 8.1  g/dL 7.4  7.2  6.5   Total Bilirubin 0.3 - 1.2 mg/dL 0.4  0.5  0.3   Alkaline Phos 38 - 126 U/L 71  83  79   AST 15 - 41 U/L 16  21  22    ALT 0 - 44 U/L 14  20  23      Lab Results  Component Value Date   WBC 8.1 09/25/2022   HGB 15.1 (H) 09/25/2022   HCT 45.5 09/25/2022   MCV 91.0 09/25/2022   PLT 249 09/25/2022   NEUTROABS 6.6 09/25/2022    ASSESSMENT & PLAN:  Primary cancer of lower outer quadrant of left female breast (HCC) Left breast cancer invasive ductal carcinoma: 0.5 cm with 9 mm area of DCIS status post left mastectomy 01/04/2012 ER/PR positive HER-2 negative Ki-67 17% currently on Arimidex 1 mg daily  Since 02/07/2012.  Completed 7 years of October 2020  (cecal adenocarcinoma diagnosed 04/22/2013, preop CEA of 5.1, status post right hemicolectomy 04/28/2014, grade 2, no MVI, 0/25 lymph nodes, subserosal involvement, T3 N0 M0 stage II A, no mismatch repair)   Contralateral breast cancer: 05/19/2021: Right breast mammogram and ultrasound: 1.6 cm mass in the right breast 12 o'clock position with architectural distortion highly suspicious for malignancy, no axillary lymph nodes, biopsy: Grade 3 IDC ER 0%, PR 0%, HER2 negative 1+ by IHC, Ki-67 20%    06/23/2021:Right mastectomy: Grade 3 IDC with DCIS, 3.4 cm, margins negative, 2/3 lymph nodes positive ER 0%, PR 0%, HER2 negative, Ki-67 20%    CT CAP and bone scan: 06/08/2021: Multiple small lung nodules stable with the exception of new 4 mm left lower lobe lung nodule and a 6 mm right lower lobe lung nodule open (nonspecific)   Treatment plan: Adjuvant chemotherapy with CMF x6 cycles completed 11/15/2021 Adjuvant radiation 12/09/2021-01/12/2022 ------------------------------------------------------------------------------------------------------- Breast cancer surveillance: Since she had bilateral mastectomies there is no role of mammograms. 10/15/2022: Breast exam: Benign  Lung nodules: CT chest 10/01/2022: Stable 5 mm bilateral lung  nodules, ascending thoracic aneurysm 4.3 cm (stable since 2010) History of colon cancer: CEA: Normal She is getting ready to do a knee replacement surgery next week.  I will see the patient  once a year with CT chest performed ahead of time.    No orders of the defined types were placed in this encounter.  The patient has a good understanding of the overall plan. she agrees with it. she will call with any problems that may develop before the next visit here. Total time spent: 30 mins including face to face time and time spent for planning, charting and co-ordination of care   Tamsen Meek, MD 10/02/22    I Janan Ridge am acting as a Neurosurgeon for [employer]  I have reviewed the above documentation for accuracy and completeness, and I agree with the above.

## 2022-09-20 DIAGNOSIS — M1711 Unilateral primary osteoarthritis, right knee: Secondary | ICD-10-CM | POA: Diagnosis not present

## 2022-09-22 NOTE — Progress Notes (Signed)
RUE restriction  COVID Vaccine Completed:yes  Date of COVID positive in last 90 days: no  PCP - Jarome Matin, MD Cardiologist - n/a  Chest CT- 05/01/22 Epic Chest x-ray - n/a EKG - 09/25/22 Epic/chart Stress Test - 2010 ECHO - 2014 Cardiac Cath - n/a Pacemaker/ICD device last checked: n/a Spinal Cord Stimulator: n/a  Bowel Prep - no  Sleep Study - n/a CPAP -   Fasting Blood Sugar - n/a Checks Blood Sugar _____ times a day  Last dose of GLP1 agonist-  N/A GLP1 instructions:  N/A   Last dose of SGLT-2 inhibitors-  N/A SGLT-2 instructions: N/A   Blood Thinner Instructions:  n/a Aspirin Instructions: Last Dose:  Activity level: Can go up a flight of stairs and perform activities of daily living without stopping and without symptoms of chest pain or shortness of breath. Slow with stairs due to knee   Anesthesia review: breast and colon cancer, HTN, 1st degree AV block on EKG  Patient denies shortness of breath, fever, cough and chest pain at PAT appointment  Patient verbalized understanding of instructions that were given to them at the PAT appointment. Patient was also instructed that they will need to review over the PAT instructions again at home before surgery.

## 2022-09-22 NOTE — Patient Instructions (Signed)
SURGICAL WAITING ROOM VISITATION  Patients having surgery or a procedure may have no more than 2 support people in the waiting area - these visitors may rotate.    Children under the age of 93 must have an adult with them who is not the patient.  Due to an increase in RSV and influenza rates and associated hospitalizations, children ages 4 and under may not visit patients in [facility] hospitals.  If the patient needs to stay at the hospital during part of their recovery, the visitor guidelines for inpatient rooms apply. Pre-op nurse will coordinate an appropriate time for 1 support person to accompany patient in pre-op.  This support person may not rotate.    Please refer to the [facility] website for the visitor guidelines for Inpatients (after your surgery is over and you are in a regular room).    Your procedure is scheduled on: 10/09/22   Report to [facility] Main Entrance    Report to admitting at 8:00 AM   Call this number if you have problems the morning of surgery [phone]   Do not eat food :After Midnight.   After Midnight you may have the following liquids until 7:30 AM DAY OF SURGERY  Water Non-Citrus Juices (without pulp, NO RED-Apple, White grape, White cranberry) Black Coffee (NO MILK/CREAM OR CREAMERS, sugar ok)  Clear Tea (NO MILK/CREAM OR CREAMERS, sugar ok) regular and decaf                             Plain Jell-O (NO RED)                                           Fruit ices (not with fruit pulp, NO RED)                                     Popsicles (NO RED)                                                               Sports drinks like Gatorade (NO RED)    The day of surgery:  Drink ONE (1) Pre-Surgery Clear Ensure at 7:30 AM the morning of surgery. Drink in one sitting. Do not sip.  This drink was given to you during your hospital  pre-op appointment visit. Nothing else to drink after completing the  Pre-Surgery Clear Ensure.          If  you have questions, please contact your surgeon's office.   FOLLOW BOWEL PREP AND ANY ADDITIONAL PRE OP INSTRUCTIONS YOU RECEIVED FROM YOUR SURGEON'S OFFICE!!!     Oral Hygiene is also important to reduce your risk of infection.                                    Remember - BRUSH YOUR TEETH THE MORNING OF SURGERY WITH YOUR REGULAR TOOTHPASTE  DENTURES WILL BE REMOVED PRIOR TO SURGERY PLEASE DO NOT APPLY "Poly grip" OR ADHESIVES!!!  Take these medicines the morning of surgery with A SIP OF WATER: Tylenol, Inhaler             You may not have any metal on your body including hair pins, jewelry, and body piercing             Do not wear make-up, lotions, powders, perfumes, or deodorant  Do not wear nail polish including gel and S&S, artificial/acrylic nails, or any other type of covering on natural nails including finger and toenails. If you have artificial nails, gel coating, etc. that needs to be removed by a nail salon please have this removed prior to surgery or surgery may need to be canceled/ delayed if the surgeon/ anesthesia feels like they are unable to be safely monitored.   Do not shave  48 hours prior to surgery.    Do not bring valuables to the hospital. [facility] IS NOT             RESPONSIBLE   FOR VALUABLES.   Contacts, glasses, dentures or bridgework may not be worn into surgery.   Bring small overnight bag day of surgery.   DO NOT BRING YOUR HOME MEDICATIONS TO THE HOSPITAL. PHARMACY WILL DISPENSE MEDICATIONS LISTED ON YOUR MEDICATION LIST TO YOU DURING YOUR ADMISSION IN THE HOSPITAL!              Please read over the following fact sheets you were given: IF YOU HAVE QUESTIONS ABOUT YOUR PRE-OP INSTRUCTIONS PLEASE CALL [name]   If you received a COVID test during your pre-op visit  it is requested that you wear a mask when out in public, stay away from anyone that may not be feeling well and notify your surgeon if you develop symptoms. If you test  positive for Covid or have been in contact with anyone that has tested positive in the last 10 days please notify you surgeon.      Pre-operative 5 CHG Bath Instructions   You can play a key role in reducing the risk of infection after surgery. Your skin needs to be as free of germs as possible. You can reduce the number of germs on your skin by washing with CHG (chlorhexidine gluconate) soap before surgery. CHG is an antiseptic soap that kills germs and continues to kill germs even after washing.   DO NOT use if you have an allergy to chlorhexidine/CHG or antibacterial soaps. If your skin becomes reddened or irritated, stop using the CHG and notify one of our RNs at [phone].   Please shower with the CHG soap starting 4 days before surgery using the following schedule:     Please keep in mind the following:  DO NOT shave, including legs and underarms, starting the day of your first shower.   You may shave your face at any point before/day of surgery.  Place clean sheets on your bed the day you start using CHG soap. Use a clean washcloth (not used since being washed) for each shower. DO NOT sleep with pets once you start using the CHG.   CHG Shower Instructions:  If you choose to wash your hair and private area, wash first with your normal shampoo/soap.  After you use shampoo/soap, rinse your hair and body thoroughly to remove shampoo/soap residue.  Turn the water OFF and apply about 3 tablespoons (45 ml) of CHG soap to a CLEAN washcloth.  Apply CHG soap ONLY FROM YOUR NECK DOWN TO YOUR TOES (washing for 3-5 minutes)  DO NOT use CHG soap on face, private areas, open wounds, or sores.  Pay special attention to the area where your surgery is being performed.  If you are having back surgery, having someone wash your back for you may be helpful. Wait 2 minutes after CHG soap is applied, then you may rinse off the CHG soap.  Pat dry with a clean towel  Put on clean clothes/pajamas   If  you choose to wear lotion, please use ONLY the CHG-compatible lotions on the back of this paper.     Additional instructions for the day of surgery: DO NOT APPLY any lotions, deodorants, cologne, or perfumes.   Put on clean/comfortable clothes.  Brush your teeth.  Ask your nurse before applying any prescription medications to the skin.      CHG Compatible Lotions   Aveeno Moisturizing lotion  Cetaphil Moisturizing Cream  Cetaphil Moisturizing Lotion  Clairol Herbal Essence Moisturizing Lotion, Dry Skin  Clairol Herbal Essence Moisturizing Lotion, Extra Dry Skin  Clairol Herbal Essence Moisturizing Lotion, Normal Skin  Curel Age Defying Therapeutic Moisturizing Lotion with Alpha Hydroxy  Curel Extreme Care Body Lotion  Curel Soothing Hands Moisturizing Hand Lotion  Curel Therapeutic Moisturizing Cream, Fragrance-Free  Curel Therapeutic Moisturizing Lotion, Fragrance-Free  Curel Therapeutic Moisturizing Lotion, Original Formula  Eucerin Daily Replenishing Lotion  Eucerin Dry Skin Therapy Plus Alpha Hydroxy Crme  Eucerin Dry Skin Therapy Plus Alpha Hydroxy Lotion  Eucerin Original Crme  Eucerin Original Lotion  Eucerin Plus Crme Eucerin Plus Lotion  Eucerin TriLipid Replenishing Lotion  Keri Anti-Bacterial Hand Lotion  Keri Deep Conditioning Original Lotion Dry Skin Formula Softly Scented  Keri Deep Conditioning Original Lotion, Fragrance Free Sensitive Skin Formula  Keri Lotion Fast Absorbing Fragrance Free Sensitive Skin Formula  Keri Lotion Fast Absorbing Softly Scented Dry Skin Formula  Keri Original Lotion  Keri Skin Renewal Lotion Keri Silky Smooth Lotion  Keri Silky Smooth Sensitive Skin Lotion  Nivea Body Creamy Conditioning Oil  Nivea Body Extra Enriched Lotion  Nivea Body Original Lotion  Nivea Body Sheer Moisturizing Lotion Nivea Crme  Nivea Skin Firming Lotion  NutraDerm 30 Skin Lotion  NutraDerm Skin Lotion  NutraDerm Therapeutic Skin Cream  NutraDerm  Therapeutic Skin Lotion  ProShield Protective Hand Cream  Provon moisturizing lotion   Incentive Spirometer  An incentive spirometer is a tool that can help keep your lungs clear and active. This tool measures how well you are filling your lungs with each breath. Taking long deep breaths may help reverse or decrease the chance of developing breathing (pulmonary) problems (especially infection) following: A long period of time when you are unable to move or be active. BEFORE THE PROCEDURE  If the spirometer includes an indicator to show your best effort, your nurse or respiratory therapist will set it to a desired goal. If possible, sit up straight or lean slightly forward. Try not to slouch. Hold the incentive spirometer in an upright position. INSTRUCTIONS FOR USE  Sit on the edge of your bed if possible, or sit up as far as you can in bed or on a chair. Hold the incentive spirometer in an upright position. Breathe out normally. Place the mouthpiece in your mouth and seal your lips tightly around it. Breathe in slowly and as deeply as possible, raising the piston or the ball toward the top of the column. Hold your breath for 3-5 seconds or for as long as possible. Allow the piston or ball to fall to the bottom of the  column. Remove the mouthpiece from your mouth and breathe out normally. Rest for a few seconds and repeat Steps 1 through 7 at least 10 times every 1-2 hours when you are awake. Take your time and take a few normal breaths between deep breaths. The spirometer may include an indicator to show your best effort. Use the indicator as a goal to work toward during each repetition. After each set of 10 deep breaths, practice coughing to be sure your lungs are clear. If you have an incision (the cut made at the time of surgery), support your incision when coughing by placing a pillow or rolled up towels firmly against it. Once you are able to get out of bed, walk around indoors and cough  well. You may stop using the incentive spirometer when instructed by your caregiver.  RISKS AND COMPLICATIONS Take your time so you do not get dizzy or light-headed. If you are in pain, you may need to take or ask for pain medication before doing incentive spirometry. It is harder to take a deep breath if you are having pain. AFTER USE Rest and breathe slowly and easily. It can be helpful to keep track of a log of your progress. Your caregiver can provide you with a simple table to help with this. If you are using the spirometer at home, follow these instructions: SEEK MEDICAL CARE IF:  You are having difficultly using the spirometer. You have trouble using the spirometer as often as instructed. Your pain medication is not giving enough relief while using the spirometer. You develop fever of 100.5 F (38.1 C) or higher. SEEK IMMEDIATE MEDICAL CARE IF:  You cough up bloody sputum that had not been present before. You develop fever of 102 F (38.9 C) or greater. You develop worsening pain at or near the incision site. MAKE SURE YOU:  Understand these instructions. Will watch your condition. Will get help right away if you are not doing well or get worse. Document Released: 08/14/2006 Document Revised: 06/26/2011 Document Reviewed: 10/15/2006 [facility] Patient Information 2014 [place], [place].   ________________________________________________________________________

## 2022-09-25 ENCOUNTER — Encounter (HOSPITAL_COMMUNITY): Payer: Self-pay

## 2022-09-25 ENCOUNTER — Inpatient Hospital Stay: Payer: Medicare PPO | Attending: Hematology and Oncology

## 2022-09-25 ENCOUNTER — Encounter (HOSPITAL_COMMUNITY)
Admission: RE | Admit: 2022-09-25 | Discharge: 2022-09-25 | Disposition: A | Discharge status: Home / Self Care | Payer: Medicare PPO | Source: Ambulatory Visit | Attending: Orthopedic Surgery | Admitting: Orthopedic Surgery

## 2022-09-25 ENCOUNTER — Ambulatory Visit (HOSPITAL_COMMUNITY)
Admission: RE | Admit: 2022-09-25 | Discharge: 2022-09-25 | Disposition: A | Discharge status: Home / Self Care | Payer: Medicare PPO | Source: Ambulatory Visit | Attending: Adult Health | Admitting: Adult Health

## 2022-09-25 ENCOUNTER — Other Ambulatory Visit: Payer: Self-pay

## 2022-09-25 VITALS — BP 161/94 | HR 90 | Temp 98.0°F | Resp 16 | Ht 63.5 in | Wt 206.6 lb

## 2022-09-25 DIAGNOSIS — C50111 Malignant neoplasm of central portion of right female breast: Secondary | ICD-10-CM | POA: Insufficient documentation

## 2022-09-25 DIAGNOSIS — R911 Solitary pulmonary nodule: Secondary | ICD-10-CM | POA: Insufficient documentation

## 2022-09-25 DIAGNOSIS — C50512 Malignant neoplasm of lower-outer quadrant of left female breast: Secondary | ICD-10-CM | POA: Diagnosis not present

## 2022-09-25 DIAGNOSIS — C189 Malignant neoplasm of colon, unspecified: Secondary | ICD-10-CM

## 2022-09-25 DIAGNOSIS — Z171 Estrogen receptor negative status [ER-]: Secondary | ICD-10-CM | POA: Insufficient documentation

## 2022-09-25 DIAGNOSIS — I10 Essential (primary) hypertension: Secondary | ICD-10-CM

## 2022-09-25 DIAGNOSIS — Z01818 Encounter for other preprocedural examination: Secondary | ICD-10-CM | POA: Diagnosis not present

## 2022-09-25 DIAGNOSIS — R918 Other nonspecific abnormal finding of lung field: Secondary | ICD-10-CM | POA: Insufficient documentation

## 2022-09-25 DIAGNOSIS — Z85038 Personal history of other malignant neoplasm of large intestine: Secondary | ICD-10-CM | POA: Insufficient documentation

## 2022-09-25 LAB — SURGICAL PCR SCREEN
MRSA, PCR: NEGATIVE
Staphylococcus aureus: NEGATIVE

## 2022-09-25 LAB — CBC WITH DIFFERENTIAL (CANCER CENTER ONLY)
Abs Immature Granulocytes: 0.07 10*3/uL (ref 0.00–0.07)
Basophils Absolute: 0 10*3/uL (ref 0.0–0.1)
Basophils Relative: 0 %
Eosinophils Absolute: 0.2 10*3/uL (ref 0.0–0.5)
Eosinophils Relative: 2 %
HCT: 45.5 % (ref 36.0–46.0)
Hemoglobin: 15.1 g/dL — ABNORMAL HIGH (ref 12.0–15.0)
Immature Granulocytes: 1 %
Lymphocytes Relative: 8 %
Lymphs Abs: 0.6 10*3/uL — ABNORMAL LOW (ref 0.7–4.0)
MCH: 30.2 pg (ref 26.0–34.0)
MCHC: 33.2 g/dL (ref 30.0–36.0)
MCV: 91 fL (ref 80.0–100.0)
Monocytes Absolute: 0.6 10*3/uL (ref 0.1–1.0)
Monocytes Relative: 7 %
Neutro Abs: 6.6 10*3/uL (ref 1.7–7.7)
Neutrophils Relative %: 82 %
Platelet Count: 249 10*3/uL (ref 150–400)
RBC: 5 MIL/uL (ref 3.87–5.11)
RDW: 12.8 % (ref 11.5–15.5)
WBC Count: 8.1 10*3/uL (ref 4.0–10.5)
nRBC: 0 % (ref 0.0–0.2)

## 2022-09-25 LAB — CMP (CANCER CENTER ONLY)
ALT: 14 U/L (ref 0–44)
AST: 16 U/L (ref 15–41)
Albumin: 4.4 g/dL (ref 3.5–5.0)
Alkaline Phosphatase: 71 U/L (ref 38–126)
Anion gap: 8 (ref 5–15)
BUN: 14 mg/dL (ref 8–23)
CO2: 25 mmol/L (ref 22–32)
Calcium: 9.6 mg/dL (ref 8.9–10.3)
Chloride: 103 mmol/L (ref 98–111)
Creatinine: 0.65 mg/dL (ref 0.44–1.00)
GFR, Estimated: >60 mL/min (ref >60)
Glucose, Bld: 128 mg/dL — ABNORMAL HIGH (ref 70–99)
Potassium: 3.5 mmol/L (ref 3.5–5.1)
Sodium: 136 mmol/L (ref 135–145)
Total Bilirubin: 0.4 mg/dL (ref 0.3–1.2)
Total Protein: 7.4 g/dL (ref 6.5–8.1)

## 2022-09-25 LAB — CEA (ACCESS): CEA (CHCC): 1.65 ng/mL (ref 0.00–5.00)

## 2022-09-25 MED ORDER — SODIUM CHLORIDE (PF) 0.9 % IJ SOLN
INTRAMUSCULAR | Status: AC
  Filled 2022-09-25: qty 50

## 2022-09-25 MED ORDER — IOHEXOL 300 MG/ML  SOLN
75.0000 mL | Freq: Once | INTRAMUSCULAR | Status: AC | PRN
  Administered 2022-09-25: 75 mL via INTRAVENOUS

## 2022-09-27 ENCOUNTER — Encounter (HOSPITAL_COMMUNITY): Payer: Self-pay

## 2022-09-27 NOTE — Anesthesia Preprocedure Evaluation (Addendum)
Anesthesia Evaluation  Patient identified by MRN, date of birth, ID band Patient awake    Reviewed: Allergy & Precautions, NPO status , Patient's Chart, lab work & pertinent test results  Airway Mallampati: II  TM Distance: >3 FB Neck ROM: Full    Dental  (+) Dental Advisory Given   Pulmonary asthma    breath sounds clear to auscultation       Cardiovascular hypertension, Pt. on medications + dysrhythmias  Rhythm:Regular Rate:Normal     Neuro/Psych negative neurological ROS     GI/Hepatic negative GI ROS, Neg liver ROS,,,  Endo/Other  negative endocrine ROS    Renal/GU negative Renal ROS     Musculoskeletal  (+) Arthritis ,    Abdominal   Peds  Hematology negative hematology ROS (+)   Anesthesia Other Findings   Reproductive/Obstetrics                             Anesthesia Physical Anesthesia Plan  ASA: 2  Anesthesia Plan: Spinal   Post-op Pain Management: Regional block* and Ofirmev IV (intra-op)*   Induction:   PONV Risk Score and Plan: 2 and Propofol infusion, Dexamethasone, Ondansetron and Treatment may vary due to age or medical condition  Airway Management Planned: Natural Airway and Simple Face Mask  Additional Equipment:   Intra-op Plan:   Post-operative Plan:   Informed Consent: I have reviewed the patients History and Physical, chart, labs and discussed the procedure including the risks, benefits and alternatives for the proposed anesthesia with the patient or authorized representative who has indicated his/her understanding and acceptance.       Plan Discussed with: CRNA  Anesthesia Plan Comments: ( )        Anesthesia Quick Evaluation

## 2022-09-27 NOTE — Progress Notes (Signed)
Choose an anesthesia record to view details        DISCUSSION: Ann Maxwell is a 82 year old female who presents to PAT prior to right knee TKA with Dr. Despina Hick on 10/09/2022.  Past medical history significant for history of breast cancer s/p bilateral mastectomy (Left 2013, Right 2023) and chemo/radiation, history of colon cancer s/p right hemicolectomy (2016), hypertension, anemia (previously underwent iron infusions), asthma, obesity, anxiety.  No prior anesthesia complications.  Patient follows with her PCP for her chronic medical issues.  She was last seen on 04/24/2022.  Blood pressure noted to be higher than ideal and elevated at PAT visit.  Patient was remotely hospitalized due to symptomatic anemia in 2014 and cardiology was consulted during the admission due to possible diagnosis of A-fib.  She was seen by Dr. [place] on 09/11/2012 and it was determined that her EKG was not consistent with with A-fib but rather sinus tachycardia with a first-degree AV block.  She also had evidence of transient second-degree type I heart block.  Recommendations were to discontinue diltiazem and Xarelto and obtain an echocardiogram. Echo showed mild LVH and mild MR. She has not needed any further cardiology follow-up.  On Chest CT from 05/01/22 there was an incidentally noted 4.1 cm diameter ascending thoracic aorta. Per radiology guidelines this requires annual imaging.  VS: BP (!) 161/94   Pulse 90   Temp 36.7 C (Oral)   Resp 16   Ht 5' 3.5" (1.613 m)   Wt 93.7 kg   SpO2 96%   BMI 36.02 kg/m   PROVIDERS: Garlan Fillers, MD Heme/Onc: Serena Croissant, MD  LABS: Labs reviewed: Acceptable for surgery. (all labs ordered are listed, but only abnormal results are displayed)  Labs Reviewed  SURGICAL PCR SCREEN     IMAGES:  CT Chest 05/01/22:  IMPRESSION: 1. Interval development of bandlike architectural distortion and probable scarring in the anterior right lung, likely sequelae of radiation  therapy. Some of these changes have a nodular character and, as such, close follow-up recommended. Consider repeat CT chest in 3 months. 2. Stable bilateral pulmonary nodules measuring up to 6 mm. These nodules have been stable for nearly 1 year or longer suggesting benign etiology. 3. Enlargement of the pulmonary outflow tract/main pulmonary arteries suggests pulmonary arterial hypertension. 4. 4.1 cm diameter ascending thoracic aorta. Recommend annual imaging followup by CTA or MRA. This recommendation follows 2010 ACCF/AHA/AATS/ACR/ASA/SCA/SCAI/SIR/STS/SVM Guidelines for the Diagnosis and Management of Patients with Thoracic Aortic Disease. Circulation. 2010; 121: Z610-R604. Aortic aneurysm NOS (ICD10-I71.9)  EKG 09/25/22:  NSR with 1st degree AVB   CV:  Echo 09/12/2012:  Study Conclusions   - Left ventricle: The cavity size was normal. Wall thickness    was increased in a pattern of mild LVH. There was mild    focal basal hypertrophy of the septum. Systolic function    was vigorous. The estimated ejection fraction was in the    range of 65% to 70%. Wall motion was normal; there were no    regional wall motion abnormalities.  - Mitral valve: Mild regurgitation.  Transthoracic echocardiography.  M-mode, complete 2D,  spectral Doppler, and color Doppler.  Height:  Height:  162.6cm. Height: 64in.  Weight:  Weight: 105.4kg. Weight:  231.9lb.  Body mass index:  BMI: 39.9kg/m^2.  Body surface  area:    BSA: 2.87m^2.  Blood pressure:     108/68.  Patient  status:  Inpatient.  Location:  Echo laboratory.   Past Medical History:  Diagnosis Date  Allergy    Anemia    iron infusion -last done 1 month ago(12-15, and 12-22 -15 [facility])- Dr. Pamelia Hoit   Anxiety    Asthma    Breast CA [facility]) 2013   a. L breast DCIS, s/p L mastectomy 12/2011. no further issues   Breast cancer (HCC) 2023   Right breast IDC   Cataract    bilat removed    Colon cancer (HCC)    Depression    pt denies any  hx of depression    Environmental allergies    History of kidney stones    Hypertension    controlled on meds    Internal hemorrhoids    remains an issue- no bright bleeding seen recent   Iron deficiency anemia    Obesity    Osteoarthritis    arthritis- knees and shoulders   Osteoporosis    drug induced and on Fosamax   Port-A-Cath in place 08/01/2021   Transfusion history    2 yrs ago due to anemia   Vertigo    no recent issues    Past Surgical History:  Procedure Laterality Date   BREAST BIOPSY Right 05/27/2021   U/S Bx   BREAST SURGERY     CATARACT EXTRACTION, BILATERAL  2019   CHOLECYSTECTOMY     COLON RESECTION N/A 04/28/2014   Procedure: LAPAROSCOPIC  RIGHT HEMI-COLECTOMY;  Surgeon: Almond Lint, MD;  Location: [facility];  Service: General;  Laterality: N/A;   COLONOSCOPY     Lithectomy  2010   LITHOTRIPSY     MASTECTOMY Left 2013   MASTECTOMY W/ SENTINEL NODE BIOPSY  01/04/2012   Procedure: MASTECTOMY WITH SENTINEL LYMPH NODE BIOPSY;  Surgeon: Almond Lint, MD;  [facility]: [facility];  Service: General;  Laterality: Left;   ORIF TIBIA & FIBULA FRACTURES     wtih subsequent pin removal in 1997   ORIF ULNAR / RADIAL SHAFT FRACTURE Left    POLYPECTOMY     PORT-A-CATH REMOVAL Left 12/01/2021   Procedure: REMOVAL PORT-A-CATH;  Surgeon: Almond Lint, MD;  [facility]: [facility];  Service: General;  Laterality: Left;   PORTACATH PLACEMENT Left 06/23/2021   Procedure: INSERTION PORT-A-CATH;  Surgeon: Almond Lint, MD;  Location: [facility];  Service: General;  Laterality: Left;   SENTINEL NODE BIOPSY Right 06/23/2021   Procedure: SENTINEL LYMPH NODE BIOPSY;  Surgeon: Almond Lint, MD;  Location: [facility];  Service: General;  Laterality: Right;   SIMPLE MASTECTOMY WITH AXILLARY SENTINEL NODE BIOPSY Right 06/23/2021   Procedure: RIGHT MASTECTOMY;  Surgeon: Almond Lint, MD;  Location: [facility];  Service: General;  Laterality: Right;    TONSILLECTOMY AND ADENOIDECTOMY     TOTAL KNEE ARTHROPLASTY Left 08/23/2015   Procedure: TOTAL KNEE ARTHROPLASTY;  Surgeon: Ollen Gross, MD;  Location: [facility];  Service: Orthopedics;  Laterality: Left;   VAGINAL HYSTERECTOMY  20 yrs. ago   with BSO    MEDICATIONS:  acetaminophen (TYLENOL) 650 MG CR tablet   acidophilus (RISAQUAD) CAPS capsule   ADVAIR DISKUS 250-50 MCG/DOSE AEPB   albuterol (VENTOLIN HFA) 108 (90 Base) MCG/ACT inhaler   alendronate (FOSAMAX) 70 MG tablet   amoxicillin-clavulanate (AUGMENTIN) 875-125 MG tablet   CALCIUM + VITAMIN D3 600-5 MG-MCG TABS   cephALEXin (KEFLEX) 500 MG capsule   gabapentin (NEURONTIN) 100 MG capsule   hydrochlorothiazide (MICROZIDE) 12.5 MG capsule   hydrocortisone (ANUSOL-HC) 2.5 % rectal cream   loratadine (CLARITIN) 10 MG tablet   losartan (COZAAR) 50 MG  tablet   melatonin 5 MG TABS   montelukast (SINGULAIR) 10 MG tablet   polyvinyl alcohol (LIQUIFILM TEARS) 1.4 % ophthalmic solution   Vitamin D, Ergocalciferol, (DRISDOL) 1.25 MG (50000 UNIT) CAPS capsule   No current facility-administered medications for this encounter.   Marcille Blanco [facility]/Anesthesiology [facility] Phone [phone] 09/27/2022 1:24 PM

## 2022-10-02 ENCOUNTER — Other Ambulatory Visit: Payer: Medicare PPO

## 2022-10-02 ENCOUNTER — Other Ambulatory Visit: Payer: Self-pay

## 2022-10-02 ENCOUNTER — Inpatient Hospital Stay: Payer: Medicare PPO | Admitting: Hematology and Oncology

## 2022-10-02 VITALS — BP 127/69 | HR 77 | Temp 97.5°F | Resp 18 | Ht 63.5 in | Wt 199.6 lb

## 2022-10-02 DIAGNOSIS — C189 Malignant neoplasm of colon, unspecified: Secondary | ICD-10-CM

## 2022-10-02 DIAGNOSIS — C50111 Malignant neoplasm of central portion of right female breast: Secondary | ICD-10-CM | POA: Diagnosis not present

## 2022-10-02 DIAGNOSIS — C50512 Malignant neoplasm of lower-outer quadrant of left female breast: Secondary | ICD-10-CM

## 2022-10-02 DIAGNOSIS — Z171 Estrogen receptor negative status [ER-]: Secondary | ICD-10-CM | POA: Diagnosis not present

## 2022-10-02 DIAGNOSIS — Z85038 Personal history of other malignant neoplasm of large intestine: Secondary | ICD-10-CM | POA: Diagnosis not present

## 2022-10-02 DIAGNOSIS — R911 Solitary pulmonary nodule: Secondary | ICD-10-CM | POA: Diagnosis not present

## 2022-10-02 DIAGNOSIS — R918 Other nonspecific abnormal finding of lung field: Secondary | ICD-10-CM | POA: Diagnosis not present

## 2022-10-02 NOTE — Assessment & Plan Note (Signed)
Left breast cancer invasive ductal carcinoma: 0.5 cm with 9 mm area of DCIS status post left mastectomy 01/04/2012 ER/PR positive HER-2 negative Ki-67 17% currently on Arimidex 1 mg daily  Since 02/07/2012.  Completed 7 years of October 2020  (cecal adenocarcinoma diagnosed 04/22/2013, preop CEA of 5.1, status post right hemicolectomy 04/28/2014, grade 2, no MVI, 0/25 lymph nodes, subserosal involvement, T3 N0 M0 stage II A, no mismatch repair)   Contralateral breast cancer: 05/19/2021: Right breast mammogram and ultrasound: 1.6 cm mass in the right breast 12 o'clock position with architectural distortion highly suspicious for malignancy, no axillary lymph nodes, biopsy: Grade 3 IDC ER 0%, PR 0%, HER2 negative 1+ by IHC, Ki-67 20%    06/23/2021:Right mastectomy: Grade 3 IDC with DCIS, 3.4 cm, margins negative, 2/3 lymph nodes positive ER 0%, PR 0%, HER2 negative, Ki-67 20%    CT CAP and bone scan: 06/08/2021: Multiple small lung nodules stable with the exception of new 4 mm left lower lobe lung nodule and a 6 mm right lower lobe lung nodule open (nonspecific)   Treatment plan: Adjuvant chemotherapy with CMF x6 cycles completed 11/15/2021 Adjuvant radiation 12/09/2021-01/12/2022 ------------------------------------------------------------------------------------------------------- Breast cancer surveillance: Since she had bilateral mastectomies there is no role of mammograms. 10/15/2022: Breast exam: Benign  I will see the patient once a year.

## 2022-10-04 NOTE — H&P (Signed)
TOTAL KNEE ADMISSION H&P  Patient is being admitted for right total knee arthroplasty.  Subjective:  Chief Complaint: Right knee pain.  HPI: SERENDIPITY Ann Maxwell, 82 y.o. female has a history of pain and functional disability in the right knee due to arthritis and has failed non-surgical conservative treatments for greater than 12 weeks to include NSAID's and/or analgesics, corticosteriod injections, viscosupplementation injections, and activity modification. Onset of symptoms was gradual, starting 8 years ago with gradually worsening course since that time. The patient noted no past surgery on the right knee.  Patient currently rates pain in the right knee at 8 out of 10 with activity. Patient has worsening of pain with activity and weight bearing and pain that interferes with activities of daily living. Patient has evidence of subchondral cysts, periarticular osteophytes, and joint space narrowing by imaging studies. There is no active infection.  Patient Active Problem List   Diagnosis Date Noted   Genetic testing 07/26/2021   Family history of ovarian cancer 07/15/2021   Cancer of central portion of right breast (HCC) 06/23/2021   Malignant neoplasm of central portion of right breast in female, estrogen receptor negative (HCC) 06/01/2021   Pain in right knee 06/12/2019   Asthma 10/17/2017   OA (osteoarthritis) of knee 08/23/2015   Primary colon cancer (HCC) 03/31/2014   Iron deficiency anemia due to chronic blood loss 07/01/2013   Sinus tachycardia 09/12/2012   Mobitz type 1 second degree AV block 09/12/2012   Chest pain 09/11/2012   HTN (hypertension) 09/11/2012   Asthma, chronic 09/11/2012   Primary cancer of lower outer quadrant of left female breast (HCC) 12/12/2011    Past Medical History:  Diagnosis Date   Allergy    Anemia    iron infusion -last done 1 month ago(12-15, and 12-22 -15 [facility])- Dr. Pamelia Hoit   Anxiety    Asthma    Breast CA (HCC) 2013   a. L breast DCIS, s/p L  mastectomy 12/2011. no further issues   Breast cancer (HCC) 2023   Right breast IDC   Cataract    bilat removed    Colon cancer (HCC)    Depression    pt denies any hx of depression    Environmental allergies    History of kidney stones    Hypertension    controlled on meds    Internal hemorrhoids    remains an issue- no bright bleeding seen recent   Iron deficiency anemia    Obesity    Osteoarthritis    arthritis- knees and shoulders   Osteoporosis    drug induced and on Fosamax   Port-A-Cath in place 08/01/2021   Transfusion history    2 yrs ago due to anemia   Vertigo    no recent issues    Past Surgical History:  Procedure Laterality Date   BREAST BIOPSY Right 05/27/2021   U/S Bx   BREAST SURGERY     CATARACT EXTRACTION, BILATERAL  2019   CHOLECYSTECTOMY     COLON RESECTION N/A 04/28/2014   Procedure: LAPAROSCOPIC  RIGHT HEMI-COLECTOMY;  Surgeon: Almond Lint, MD;  [facility];  Service: General;  Laterality: N/A;   COLONOSCOPY     Lithectomy  2010   LITHOTRIPSY     MASTECTOMY Left 2013   MASTECTOMY W/ SENTINEL NODE BIOPSY  01/04/2012   Procedure: MASTECTOMY WITH SENTINEL LYMPH NODE BIOPSY;  Surgeon: Almond Lint, MD;  [facility];  Service: General;  Laterality: Left;   ORIF TIBIA & FIBULA  FRACTURES     wtih subsequent pin removal in 1997   ORIF ULNAR / RADIAL SHAFT FRACTURE Left    POLYPECTOMY     PORT-A-CATH REMOVAL Left 12/01/2021   Procedure: REMOVAL PORT-A-CATH;  Surgeon: Almond Lint, MD;  [facility];  Service: General;  Laterality: Left;   PORTACATH PLACEMENT Left 06/23/2021   Procedure: INSERTION PORT-A-CATH;  Surgeon: Almond Lint, MD;  Location: [facility];  Service: General;  Laterality: Left;   SENTINEL NODE BIOPSY Right 06/23/2021   Procedure: SENTINEL LYMPH NODE BIOPSY;  Surgeon: Almond Lint, MD;  Location: [facility];  Service: General;  Laterality: Right;   SIMPLE MASTECTOMY WITH AXILLARY SENTINEL NODE  BIOPSY Right 06/23/2021   Procedure: RIGHT MASTECTOMY;  Surgeon: Almond Lint, MD;  Location: [facility];  Service: General;  Laterality: Right;   TONSILLECTOMY AND ADENOIDECTOMY     TOTAL KNEE ARTHROPLASTY Left 08/23/2015   Procedure: TOTAL KNEE ARTHROPLASTY;  Surgeon: Ollen Gross, MD;  [facility];  Service: Orthopedics;  Laterality: Left;   VAGINAL HYSTERECTOMY  20 yrs. ago   with BSO    Prior to Admission medications   Medication Sig Start Date End Date Taking? Authorizing Provider  acetaminophen (TYLENOL) 650 MG CR tablet Take 650 mg by mouth every 8 (eight) hours as needed for pain.   Yes [provider]  acidophilus (RISAQUAD) CAPS capsule Take 1 capsule by mouth daily.   Yes [provider]  ADVAIR DISKUS 250-50 MCG/DOSE AEPB Inhale 1 puff into the lungs 2 (two) times daily. 07/23/14  Yes Serena Croissant, MD  albuterol (VENTOLIN HFA) 108 (90 Base) MCG/ACT inhaler Inhale 2 puffs into the lungs every 6 (six) hours as needed for wheezing or shortness of breath.   Yes [provider]  alendronate (FOSAMAX) 70 MG tablet Take 1 tablet (70 mg total) by mouth once a week. Take with a full glass of water on an empty stomach. Patient taking differently: Take 70 mg by mouth every Thursday. Take with a full glass of water on an empty stomach. 05/28/17  Yes Serena Croissant, MD  CALCIUM + VITAMIN D3 600-5 MG-MCG TABS Take 1 tablet by mouth daily. 08/07/22  Yes [provider]  cephALEXin (KEFLEX) 500 MG capsule Take 500 mg by mouth every 8 (eight) hours. 09/20/22  Yes [provider]  hydrochlorothiazide (MICROZIDE) 12.5 MG capsule Take 12.5 mg by mouth daily. 08/07/19  Yes [provider]  hydrocortisone (ANUSOL-HC) 2.5 % rectal cream USE 1 application rectally 2 TIMES DAILY Patient taking differently: Place 1 Application rectally 2 (two) times daily as needed for hemorrhoids or anal itching. 02/21/22  Yes Serena Croissant, MD  loratadine  (CLARITIN) 10 MG tablet Take 10 mg by mouth daily as needed for allergies.   Yes [provider]  losartan (COZAAR) 50 MG tablet Take 50 mg by mouth every evening. 08/07/19  Yes [provider]  melatonin 5 MG TABS Take 5 mg by mouth at bedtime as needed (sleep).   Yes [provider]  montelukast (SINGULAIR) 10 MG tablet Take 10 mg by mouth at bedtime.   Yes [provider]  polyvinyl alcohol (LIQUIFILM TEARS) 1.4 % ophthalmic solution Place 2 drops into both eyes as needed for dry eyes.   Yes [provider]  Vitamin D, Ergocalciferol, (DRISDOL) 1.25 MG (50000 UNIT) CAPS capsule Take 50,000 Units by mouth every Monday. 08/07/19  Yes [provider]  amoxicillin-clavulanate (AUGMENTIN) 875-125 MG tablet Take 1  tablet by mouth 2 (two) times daily. Patient not taking: Reported on 09/21/2022 03/31/22   Loa Socks, NP  gabapentin (NEURONTIN) 100 MG capsule Take 1 capsule (100 mg total) by mouth 3 (three) times daily. Patient not taking: Reported on 09/21/2022 06/01/21   Serena Croissant, MD    Allergies  Allergen Reactions   Other Shortness Of Breath and Other (See Comments)    Tree and shrub pollen    Pollen Extract Other (See Comments)   Tape Other (See Comments) and Rash    Surgical tape causes blisters   Silvadene [Silver Sulfadiazine]     Turns skin red   Tetracycline Other (See Comments)   Tyloxapol Nausea And Vomiting    Tylox    Terramycin [Oxytetracycline] Rash    Social History   Socioeconomic History   Marital status: Divorced    Spouse name: Not on file   Number of children: Not on file   Years of education: Not on file   Highest education level: Not on file  Occupational History   Occupation: retired Engineer, civil (consulting)  Tobacco Use   Smoking status: Never    Passive exposure: Past   Smokeless tobacco: Never  Vaping Use   Vaping Use: Never used  Substance and Sexual Activity   Alcohol use: Not Currently    Comment:  occasionally - few times a year    Drug use: Never   Sexual activity: Not Currently    Birth control/protection: Surgical  Other Topics Concern   Not on file  Social History Narrative   Retired Risk analyst, 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 who is a Engineer, civil (consulting)   Social Determinants of Corporate investment banker Strain: Not on file  Food Insecurity: Not on file  Transportation Needs: Not on file  Physical Activity: Not on file  Stress: Not on file  Social Connections: Not on file  Intimate Partner Violence: Not on file    Tobacco Use: Low Risk  (09/27/2022)   Patient History    Smoking Tobacco Use: Never    Smokeless Tobacco Use: Never    Passive Exposure: Past   Social History   Substance and Sexual Activity  Alcohol Use Not Currently   Comment: occasionally - few times a year     Family History  Problem Relation Age of Onset   Congestive Heart Failure Father        Died at 69   Hypertension Father    Heart disease Father    Stroke Father    Ovarian cancer Mother 55       deceased 72   Uterine cancer Maternal Grandmother        late 45s   Liver cancer Paternal Grandfather 65       deceased 21s   Thyroid cancer Sister 56       currently 47   Colon cancer Neg Hx    Stomach cancer Neg Hx    Esophageal cancer Neg Hx    Rectal cancer Neg Hx    Breast cancer Neg Hx    Colon polyps Neg Hx     ROS  Objective:  Physical Exam: Well-developed female alert and oriented in no apparent distress    She has an antalgic gait pattern on the right    Left knee shows no swelling. Range of motion is 0 to 125. She has a tiny bit of tenderness over the MCL, no other tenderness. The knee is very stable.    Right knee shows significant valgus  deformity. Range of motion is 5 to 115. Moderate crepitus on range of motion. Tender medial greater than lateral joint line tenderness. Tender medial pole of the patella. Mild medial soft tissue swelling.  Imaging   AP of both knees, lateral taken  today showed a prosthesis on the left is in excellent position with no periprosthetic abnormalities. On the right, she has bone-on-bone arthritis, lateral and patellofemoral with valgus deformity. No evidence of acute fracture or bony malalignment.   Assessment/Plan:  End stage arthritis, right knee   The patient history, physical examination, clinical judgment of the provider and imaging studies are consistent with end stage degenerative joint disease of the right knee and total knee arthroplasty is deemed medically necessary. The treatment options including medical management, injection therapy arthroscopy and arthroplasty were discussed at length. The risks and benefits of total knee arthroplasty were presented and reviewed. The risks due to aseptic loosening, infection, stiffness, patella tracking problems, thromboembolic complications and other imponderables were discussed. The patient acknowledged the explanation, agreed to proceed with the plan and consent was signed. Patient is being admitted for inpatient treatment for surgery, pain control, PT, OT, prophylactic antibiotics, VTE prophylaxis, progressive ambulation and ADLs and discharge planning. The patient is planning to be discharged  home with OPPT scheduled .   Patient's anticipated LOS is less than 2 midnights, meeting these requirements: - Younger than 23 - Lives within 1 hour of care - Has a competent adult at home to recover with post-op recover - NO history of  - Chronic pain requiring opiods  - Diabetes  - Coronary Artery Disease  - Heart failure  - Heart attack  - Stroke  - DVT/VTE  - Cardiac arrhythmia  - Respiratory Failure/COPD  - Renal failure  - Anemia  - Advanced Liver disease    Therapy Plans: EO [place] Disposition: Home with daughter Planned DVT Prophylaxis: Xarelto 10mg  DME Needed: RW ordered PCP: Jarome Matin, MD (clearance pending) TXA: IV Allergies: adhesive tape (rash), Silvadene,  terramycin (rash), tree/shrub pollen, tylox (abdominal pain) Anesthesia Concerns: None BMI: 34.3 Last HgbA1c: Not diabetic  Pharmacy: [facility] on [address]  Other: -Clearance form given to pt -has tolerated tramadol, hydrocodone and tylenol in the past -*Place ice machine in OR*  - Patient was instructed on what medications to stop prior to surgery. - Follow-up visit in 2 weeks with Dr. Lequita Halt - Begin physical therapy following surgery - Pre-operative lab work as pre-surgical testing - Prescriptions will be provided in hospital at time of discharge  Weston Brass, PA-C Orthopedic Surgery [facility] [place]

## 2022-10-06 DIAGNOSIS — Z01818 Encounter for other preprocedural examination: Secondary | ICD-10-CM | POA: Diagnosis not present

## 2022-10-06 DIAGNOSIS — Z85038 Personal history of other malignant neoplasm of large intestine: Secondary | ICD-10-CM | POA: Diagnosis not present

## 2022-10-06 DIAGNOSIS — J45909 Unspecified asthma, uncomplicated: Secondary | ICD-10-CM | POA: Diagnosis not present

## 2022-10-06 DIAGNOSIS — M1711 Unilateral primary osteoarthritis, right knee: Secondary | ICD-10-CM | POA: Diagnosis not present

## 2022-10-06 DIAGNOSIS — I10 Essential (primary) hypertension: Secondary | ICD-10-CM | POA: Diagnosis not present

## 2022-10-09 ENCOUNTER — Observation Stay (HOSPITAL_COMMUNITY)
Admission: RE | Admit: 2022-10-09 | Discharge: 2022-10-10 | Disposition: A | Discharge status: Home / Self Care | Payer: Medicare PPO | Source: Ambulatory Visit | Attending: Orthopedic Surgery | Admitting: Orthopedic Surgery

## 2022-10-09 ENCOUNTER — Encounter (HOSPITAL_COMMUNITY): Payer: Self-pay | Admitting: Orthopedic Surgery

## 2022-10-09 ENCOUNTER — Other Ambulatory Visit: Payer: Self-pay

## 2022-10-09 ENCOUNTER — Encounter (HOSPITAL_COMMUNITY)
Admission: RE | Disposition: A | Discharge status: Home / Self Care | Payer: Self-pay | Source: Ambulatory Visit | Attending: Orthopedic Surgery

## 2022-10-09 ENCOUNTER — Ambulatory Visit (HOSPITAL_COMMUNITY): Payer: Medicare PPO | Admitting: Medical

## 2022-10-09 DIAGNOSIS — G8918 Other acute postprocedural pain: Secondary | ICD-10-CM | POA: Diagnosis not present

## 2022-10-09 DIAGNOSIS — J45909 Unspecified asthma, uncomplicated: Secondary | ICD-10-CM | POA: Diagnosis not present

## 2022-10-09 DIAGNOSIS — M179 Osteoarthritis of knee, unspecified: Secondary | ICD-10-CM | POA: Diagnosis present

## 2022-10-09 DIAGNOSIS — Z853 Personal history of malignant neoplasm of breast: Secondary | ICD-10-CM | POA: Diagnosis not present

## 2022-10-09 DIAGNOSIS — I10 Essential (primary) hypertension: Secondary | ICD-10-CM | POA: Diagnosis not present

## 2022-10-09 DIAGNOSIS — Z96652 Presence of left artificial knee joint: Secondary | ICD-10-CM | POA: Insufficient documentation

## 2022-10-09 DIAGNOSIS — Z79899 Other long term (current) drug therapy: Secondary | ICD-10-CM | POA: Diagnosis not present

## 2022-10-09 DIAGNOSIS — Z85038 Personal history of other malignant neoplasm of large intestine: Secondary | ICD-10-CM | POA: Diagnosis not present

## 2022-10-09 DIAGNOSIS — M1711 Unilateral primary osteoarthritis, right knee: Secondary | ICD-10-CM | POA: Diagnosis not present

## 2022-10-09 HISTORY — PX: TOTAL KNEE ARTHROPLASTY: SHX125

## 2022-10-09 SURGERY — ARTHROPLASTY, KNEE, TOTAL
Anesthesia: Spinal | Site: Knee | Laterality: Right

## 2022-10-09 MED ORDER — PROPOFOL 1000 MG/100ML IV EMUL
INTRAVENOUS | Status: AC
  Filled 2022-10-09: qty 100

## 2022-10-09 MED ORDER — TRAMADOL HCL 50 MG PO TABS
50.0000 mg | ORAL_TABLET | Freq: Four times a day (QID) | ORAL | Status: DC | PRN
  Administered 2022-10-09: 50 mg via ORAL
  Filled 2022-10-09 (×2): qty 1

## 2022-10-09 MED ORDER — POLYETHYLENE GLYCOL 3350 17 G PO PACK: 17.0000 g | PACK | Freq: Every day | ORAL | Status: DC | PRN

## 2022-10-09 MED ORDER — SODIUM CHLORIDE (PF) 0.9 % IJ SOLN
INTRAMUSCULAR | Status: AC
  Filled 2022-10-09: qty 50

## 2022-10-09 MED ORDER — PROPOFOL 500 MG/50ML IV EMUL
INTRAVENOUS | Status: DC | PRN
  Administered 2022-10-09 (×2): 20 mg via INTRAVENOUS
  Administered 2022-10-09: 50 ug/kg/min via INTRAVENOUS
  Administered 2022-10-09: 20 mg via INTRAVENOUS

## 2022-10-09 MED ORDER — MONTELUKAST SODIUM 10 MG PO TABS
10.0000 mg | ORAL_TABLET | Freq: Every day | ORAL | Status: DC
  Administered 2022-10-09: 10 mg via ORAL
  Filled 2022-10-09: qty 1

## 2022-10-09 MED ORDER — ONDANSETRON HCL 4 MG/2ML IJ SOLN
INTRAMUSCULAR | Status: DC | PRN
  Administered 2022-10-09: 4 mg via INTRAVENOUS

## 2022-10-09 MED ORDER — ONDANSETRON HCL 4 MG PO TABS: 4.0000 mg | ORAL_TABLET | Freq: Four times a day (QID) | ORAL | Status: DC | PRN

## 2022-10-09 MED ORDER — OXYCODONE HCL 5 MG PO TABS: 10.0000 mg | ORAL_TABLET | ORAL | Status: DC | PRN

## 2022-10-09 MED ORDER — BUPIVACAINE LIPOSOME 1.3 % IJ SUSP: 20.0000 mL | Freq: Once | INTRAMUSCULAR | Status: DC

## 2022-10-09 MED ORDER — HYDROCHLOROTHIAZIDE 12.5 MG PO TABS
12.5000 mg | ORAL_TABLET | Freq: Every day | ORAL | Status: DC
  Administered 2022-10-09 – 2022-10-10 (×2): 12.5 mg via ORAL
  Filled 2022-10-09 (×2): qty 1

## 2022-10-09 MED ORDER — TRANEXAMIC ACID-NACL 1000-0.7 MG/100ML-% IV SOLN
1000.0000 mg | INTRAVENOUS | Status: AC
  Administered 2022-10-09: 1000 mg via INTRAVENOUS
  Filled 2022-10-09: qty 100

## 2022-10-09 MED ORDER — BUPIVACAINE LIPOSOME 1.3 % IJ SUSP
INTRAMUSCULAR | Status: AC
  Filled 2022-10-09: qty 20

## 2022-10-09 MED ORDER — LOSARTAN POTASSIUM 50 MG PO TABS: 50.0000 mg | ORAL_TABLET | Freq: Every evening | ORAL | Status: DC

## 2022-10-09 MED ORDER — ACETAMINOPHEN 500 MG PO TABS
1000.0000 mg | ORAL_TABLET | Freq: Four times a day (QID) | ORAL | Status: DC
  Administered 2022-10-09: 1000 mg via ORAL
  Filled 2022-10-09: qty 2

## 2022-10-09 MED ORDER — ALBUTEROL SULFATE (2.5 MG/3ML) 0.083% IN NEBU: 2.5000 mg | INHALATION_SOLUTION | Freq: Four times a day (QID) | RESPIRATORY_TRACT | Status: DC | PRN

## 2022-10-09 MED ORDER — BISACODYL 10 MG RE SUPP: 10.0000 mg | Freq: Every day | RECTAL | Status: DC | PRN

## 2022-10-09 MED ORDER — ONDANSETRON HCL 4 MG/2ML IJ SOLN: 4.0000 mg | Freq: Four times a day (QID) | INTRAMUSCULAR | Status: DC | PRN

## 2022-10-09 MED ORDER — FENTANYL CITRATE PF 50 MCG/ML IJ SOSY
50.0000 ug | PREFILLED_SYRINGE | INTRAMUSCULAR | Status: AC
  Administered 2022-10-09: 50 ug via INTRAVENOUS
  Filled 2022-10-09: qty 2

## 2022-10-09 MED ORDER — HYDROMORPHONE HCL 1 MG/ML IJ SOLN
0.5000 mg | INTRAMUSCULAR | Status: DC | PRN
  Administered 2022-10-09: 1 mg via INTRAVENOUS
  Filled 2022-10-09: qty 1

## 2022-10-09 MED ORDER — METHOCARBAMOL 1000 MG/10ML IJ SOLN: 500.0000 mg | Freq: Four times a day (QID) | INTRAVENOUS | Status: DC | PRN

## 2022-10-09 MED ORDER — SODIUM CHLORIDE 0.9 % IR SOLN
Status: DC | PRN
  Administered 2022-10-09: 1000 mL

## 2022-10-09 MED ORDER — STERILE WATER FOR IRRIGATION IR SOLN
Status: DC | PRN
  Administered 2022-10-09: 2000 mL

## 2022-10-09 MED ORDER — LACTATED RINGERS IV SOLN: INTRAVENOUS | Status: DC | PRN

## 2022-10-09 MED ORDER — OXYCODONE HCL 5 MG PO TABS: 5.0000 mg | ORAL_TABLET | ORAL | Status: DC | PRN

## 2022-10-09 MED ORDER — FLEET ENEMA 7-19 GM/118ML RE ENEM: 1.0000 | ENEMA | Freq: Once | RECTAL | Status: DC | PRN

## 2022-10-09 MED ORDER — CEFAZOLIN SODIUM-DEXTROSE 2-4 GM/100ML-% IV SOLN
2.0000 g | Freq: Four times a day (QID) | INTRAVENOUS | Status: AC
  Administered 2022-10-09 (×2): 2 g via INTRAVENOUS
  Filled 2022-10-09 (×2): qty 100

## 2022-10-09 MED ORDER — METOCLOPRAMIDE HCL 5 MG/ML IJ SOLN: 5.0000 mg | Freq: Three times a day (TID) | INTRAMUSCULAR | Status: DC | PRN

## 2022-10-09 MED ORDER — SODIUM CHLORIDE (PF) 0.9 % IJ SOLN
INTRAMUSCULAR | Status: AC
  Filled 2022-10-09: qty 10

## 2022-10-09 MED ORDER — RIVAROXABAN 10 MG PO TABS
10.0000 mg | ORAL_TABLET | Freq: Every day | ORAL | Status: DC
  Administered 2022-10-10: 10 mg via ORAL
  Filled 2022-10-09: qty 1

## 2022-10-09 MED ORDER — DEXAMETHASONE SODIUM PHOSPHATE 10 MG/ML IJ SOLN
INTRAMUSCULAR | Status: AC
  Filled 2022-10-09: qty 1

## 2022-10-09 MED ORDER — ONDANSETRON HCL 4 MG/2ML IJ SOLN
INTRAMUSCULAR | Status: AC
  Filled 2022-10-09: qty 2

## 2022-10-09 MED ORDER — DOCUSATE SODIUM 100 MG PO CAPS
100.0000 mg | ORAL_CAPSULE | Freq: Two times a day (BID) | ORAL | Status: DC
  Administered 2022-10-09 – 2022-10-10 (×2): 100 mg via ORAL
  Filled 2022-10-09 (×2): qty 1

## 2022-10-09 MED ORDER — DEXAMETHASONE SODIUM PHOSPHATE 10 MG/ML IJ SOLN
8.0000 mg | Freq: Once | INTRAMUSCULAR | Status: AC
  Administered 2022-10-09: 8 mg via INTRAVENOUS

## 2022-10-09 MED ORDER — CHLORHEXIDINE GLUCONATE 0.12 % MT SOLN
15.0000 mL | Freq: Once | OROMUCOSAL | Status: AC
  Administered 2022-10-09: 15 mL via OROMUCOSAL

## 2022-10-09 MED ORDER — ACETAMINOPHEN 10 MG/ML IV SOLN
1000.0000 mg | Freq: Four times a day (QID) | INTRAVENOUS | Status: DC
  Administered 2022-10-09: 1000 mg via INTRAVENOUS
  Filled 2022-10-09: qty 100

## 2022-10-09 MED ORDER — MOMETASONE FURO-FORMOTEROL FUM 200-5 MCG/ACT IN AERO
2.0000 | INHALATION_SPRAY | Freq: Two times a day (BID) | RESPIRATORY_TRACT | Status: DC
  Filled 2022-10-09: qty 8.8

## 2022-10-09 MED ORDER — HYDROCODONE-ACETAMINOPHEN 5-325 MG PO TABS
1.0000 | ORAL_TABLET | ORAL | Status: DC | PRN
  Administered 2022-10-09 – 2022-10-10 (×3): 1 via ORAL
  Administered 2022-10-10: 2 via ORAL
  Administered 2022-10-10: 1 via ORAL
  Administered 2022-10-10: 2 via ORAL
  Filled 2022-10-09 (×3): qty 2
  Filled 2022-10-09: qty 1
  Filled 2022-10-09 (×3): qty 2
  Filled 2022-10-09: qty 1

## 2022-10-09 MED ORDER — MIDAZOLAM HCL 2 MG/2ML IJ SOLN: 1.0000 mg | INTRAMUSCULAR | Status: DC

## 2022-10-09 MED ORDER — AMISULPRIDE (ANTIEMETIC) 5 MG/2ML IV SOLN: 10.0000 mg | Freq: Once | INTRAVENOUS | Status: DC | PRN

## 2022-10-09 MED ORDER — HYDROCHLOROTHIAZIDE 12.5 MG PO TABS: 12.5000 mg | ORAL_TABLET | Freq: Every day | ORAL | Status: DC

## 2022-10-09 MED ORDER — BUPIVACAINE LIPOSOME 1.3 % IJ SUSP
INTRAMUSCULAR | Status: DC | PRN
  Administered 2022-10-09: 20 mL

## 2022-10-09 MED ORDER — SODIUM CHLORIDE 0.9 % IV SOLN: INTRAVENOUS | Status: DC

## 2022-10-09 MED ORDER — ORAL CARE MOUTH RINSE: 15.0000 mL | Freq: Once | OROMUCOSAL | Status: AC

## 2022-10-09 MED ORDER — LORATADINE 10 MG PO TABS: 10.0000 mg | ORAL_TABLET | Freq: Every day | ORAL | Status: DC | PRN

## 2022-10-09 MED ORDER — MENTHOL 3 MG MT LOZG: 1.0000 | LOZENGE | OROMUCOSAL | Status: DC | PRN

## 2022-10-09 MED ORDER — FENTANYL CITRATE PF 50 MCG/ML IJ SOSY: 25.0000 ug | PREFILLED_SYRINGE | INTRAMUSCULAR | Status: DC | PRN

## 2022-10-09 MED ORDER — ROPIVACAINE HCL 5 MG/ML IJ SOLN
INTRAMUSCULAR | Status: DC | PRN
  Administered 2022-10-09: 20 mL via PERINEURAL

## 2022-10-09 MED ORDER — ACETAMINOPHEN ER 650 MG PO TBCR: 650.0000 mg | EXTENDED_RELEASE_TABLET | Freq: Three times a day (TID) | ORAL | Status: DC | PRN

## 2022-10-09 MED ORDER — POVIDONE-IODINE 10 % EX SWAB: 2.0000 | Freq: Once | CUTANEOUS | Status: DC

## 2022-10-09 MED ORDER — TRAMADOL HCL 50 MG PO TABS: 50.0000 mg | ORAL_TABLET | Freq: Four times a day (QID) | ORAL | Status: DC | PRN

## 2022-10-09 MED ORDER — METOCLOPRAMIDE HCL 5 MG PO TABS: 5.0000 mg | ORAL_TABLET | Freq: Three times a day (TID) | ORAL | Status: DC | PRN

## 2022-10-09 MED ORDER — CLONIDINE HCL (ANALGESIA) 100 MCG/ML EP SOLN
EPIDURAL | Status: DC | PRN
  Administered 2022-10-09: 50 ug

## 2022-10-09 MED ORDER — DEXAMETHASONE SODIUM PHOSPHATE 10 MG/ML IJ SOLN
10.0000 mg | Freq: Once | INTRAMUSCULAR | Status: AC
  Administered 2022-10-10: 10 mg via INTRAVENOUS
  Filled 2022-10-09: qty 1

## 2022-10-09 MED ORDER — 0.9 % SODIUM CHLORIDE (POUR BTL) OPTIME
TOPICAL | Status: DC | PRN
  Administered 2022-10-09: 1000 mL

## 2022-10-09 MED ORDER — CEFAZOLIN SODIUM-DEXTROSE 2-4 GM/100ML-% IV SOLN
2.0000 g | INTRAVENOUS | Status: AC
  Administered 2022-10-09: 2 g via INTRAVENOUS
  Filled 2022-10-09: qty 100

## 2022-10-09 MED ORDER — SODIUM CHLORIDE (PF) 0.9 % IJ SOLN
INTRAMUSCULAR | Status: DC | PRN
  Administered 2022-10-09: 60 mL

## 2022-10-09 MED ORDER — PHENOL 1.4 % MT LIQD: 1.0000 | OROMUCOSAL | Status: DC | PRN

## 2022-10-09 MED ORDER — LACTATED RINGERS IV SOLN: INTRAVENOUS | Status: DC

## 2022-10-09 MED ORDER — DIPHENHYDRAMINE HCL 12.5 MG/5ML PO ELIX: 12.5000 mg | ORAL_SOLUTION | ORAL | Status: DC | PRN

## 2022-10-09 MED ORDER — PHENYLEPHRINE HCL-NACL 20-0.9 MG/250ML-% IV SOLN
INTRAVENOUS | Status: DC | PRN
  Administered 2022-10-09: 25 ug/min via INTRAVENOUS

## 2022-10-09 MED ORDER — METHOCARBAMOL 500 MG PO TABS
500.0000 mg | ORAL_TABLET | Freq: Four times a day (QID) | ORAL | Status: DC | PRN
  Administered 2022-10-09 – 2022-10-10 (×3): 500 mg via ORAL
  Filled 2022-10-09 (×3): qty 1

## 2022-10-09 MED ORDER — HYDROCODONE-ACETAMINOPHEN 5-325 MG PO TABS: 1.0000 | ORAL_TABLET | Freq: Four times a day (QID) | ORAL | Status: DC | PRN

## 2022-10-09 MED ORDER — BUPIVACAINE IN DEXTROSE 0.75-8.25 % IT SOLN
INTRATHECAL | Status: DC | PRN
  Administered 2022-10-09: 1.6 mL via INTRATHECAL

## 2022-10-09 SURGICAL SUPPLY — 60 items
ADH SKN CLS APL DERMABOND .7 (GAUZE/BANDAGES/DRESSINGS) ×1
ADH SKNCLS APL OCTYL .7 VIOL (GAUZE/BANDAGES/DRESSINGS) ×1
ATTUNE MED DOME PAT 38 KNEE (Knees) IMPLANT
ATTUNE PSFEM RTSZ6 NARCEM KNEE (Femur) IMPLANT
ATTUNE PSRP INSR SZ6 10 KNEE (Insert) IMPLANT
BAG COUNTER SPONGE SURGICOUNT (BAG) IMPLANT
BAG SPEC THK2 15X12 ZIP CLS (MISCELLANEOUS) ×1
BAG SPNG CNTER NS LX DISP (BAG) ×1
BAG ZIPLOCK 12X15 (MISCELLANEOUS) ×2 IMPLANT
BASE TIBIA ATTUNE KNEE SYS SZ6 (Knees) IMPLANT
BLADE SAG 18X100X1.27 (BLADE) ×2 IMPLANT
BLADE SAW SGTL 11.0X1.19X90.0M (BLADE) ×2 IMPLANT
BNDG CMPR 5X62 HK CLSR LF (GAUZE/BANDAGES/DRESSINGS) ×1
BNDG CMPR MED 10X6 ELC LF (GAUZE/BANDAGES/DRESSINGS) ×1
BNDG ELASTIC 6INX 5YD STR LF (GAUZE/BANDAGES/DRESSINGS) ×2 IMPLANT
BNDG ELASTIC 6X10 VLCR STRL LF (GAUZE/BANDAGES/DRESSINGS) IMPLANT
BOWL SMART MIX CTS (DISPOSABLE) ×2 IMPLANT
BSPLAT TIB 6 CMNT ROT PLAT STR (Knees) ×1 IMPLANT
CEMENT HV SMART SET (Cement) ×4 IMPLANT
COTTON STERILE ROLL (GAUZE/BANDAGES/DRESSINGS) IMPLANT
COVER SURGICAL LIGHT HANDLE (MISCELLANEOUS) ×2 IMPLANT
CUFF TOURN SGL QUICK 34 (TOURNIQUET CUFF) ×1
CUFF TRNQT CYL 34X4.125X (TOURNIQUET CUFF) ×2 IMPLANT
DERMABOND ADVANCED .7 DNX12 (GAUZE/BANDAGES/DRESSINGS) ×2 IMPLANT
DRAPE INCISE IOBAN 66X45 STRL (DRAPES) ×2 IMPLANT
DRAPE U-SHAPE 47X51 STRL (DRAPES) ×2 IMPLANT
DRSG AQUACEL AG ADV 3.5X10 (GAUZE/BANDAGES/DRESSINGS) ×2 IMPLANT
DURAPREP 26ML APPLICATOR (WOUND CARE) ×2 IMPLANT
ELECT REM PT RETURN 15FT ADLT (MISCELLANEOUS) ×2 IMPLANT
GLOVE BIO SURGEON STRL SZ 6.5 (GLOVE) IMPLANT
GLOVE BIO SURGEON STRL SZ8 (GLOVE) ×2 IMPLANT
GLOVE BIOGEL PI IND STRL 6.5 (GLOVE) IMPLANT
GLOVE BIOGEL PI IND STRL 7.0 (GLOVE) IMPLANT
GLOVE BIOGEL PI IND STRL 8 (GLOVE) ×2 IMPLANT
GOWN STRL REUS W/ TWL LRG LVL3 (GOWN DISPOSABLE) ×2 IMPLANT
GOWN STRL REUS W/TWL LRG LVL3 (GOWN DISPOSABLE) ×1
HANDPIECE INTERPULSE COAX TIP (DISPOSABLE) ×1
HOLDER FOLEY CATH W/STRAP (MISCELLANEOUS) IMPLANT
IMMOBILIZER KNEE 20 (SOFTGOODS) ×1
IMMOBILIZER KNEE 20 THIGH 36 (SOFTGOODS) ×2 IMPLANT
KIT TURNOVER KIT A (KITS) IMPLANT
MANIFOLD NEPTUNE II (INSTRUMENTS) ×2 IMPLANT
NS IRRIG 1000ML POUR BTL (IV SOLUTION) ×2 IMPLANT
PACK TOTAL KNEE CUSTOM (KITS) ×2 IMPLANT
PAD COLD SHLDR WRAP-ON (PAD) IMPLANT
PADDING CAST COTTON 6X4 STRL (CAST SUPPLIES) ×4 IMPLANT
PIN STEINMAN FIXATION KNEE (PIN) IMPLANT
PROTECTOR NERVE ULNAR (MISCELLANEOUS) ×2 IMPLANT
SET HNDPC FAN SPRY TIP SCT (DISPOSABLE) ×2 IMPLANT
SPIKE FLUID TRANSFER (MISCELLANEOUS) ×2 IMPLANT
SUT MNCRL AB 4-0 PS2 18 (SUTURE) ×2 IMPLANT
SUT STRATAFIX 0 PDS 27 VIOLET (SUTURE) ×1
SUT VIC AB 2-0 CT1 27 (SUTURE) ×3
SUT VIC AB 2-0 CT1 TAPERPNT 27 (SUTURE) ×6 IMPLANT
SUTURE STRATFX 0 PDS 27 VIOLET (SUTURE) ×2 IMPLANT
TIBIA ATTUNE KNEE SYS BASE SZ6 (Knees) ×1 IMPLANT
TRAY FOLEY MTR SLVR 16FR STAT (SET/KITS/TRAYS/PACK) ×2 IMPLANT
TUBE SUCTION HIGH CAP CLEAR NV (SUCTIONS) ×2 IMPLANT
WATER STERILE IRR 1000ML POUR (IV SOLUTION) ×4 IMPLANT
WRAP KNEE MAXI GEL POST OP (GAUZE/BANDAGES/DRESSINGS) ×2 IMPLANT

## 2022-10-09 NOTE — Evaluation (Signed)
Physical Therapy Evaluation Patient Details Name: Ann Maxwell MRN: [id number] DOB: 07-17-1940 Today's Date: 10/09/2022  History of Present Illness  82 yo female presents to therapy s/p R TKA on 10/09/2022 due to failure of conservative measures. Pt PMH includes but is not limited to: R  and L ba ca s/p B mastectomies, asthma, colon ca, anemia, angina, sinus tachycardia, second degree AV block. HTN, vertigo, and L TKA.  Clinical Impression    Ann Maxwell is a 82 y.o. female POD 0 s/p R TKA. Patient reports IND with mobility at baseline. Patient is now limited by functional impairments (see PT problem list below) and requires min guard for bed mobility and min A and cues for transfers. Patient was able to ambulate 36 feet with RW and min guard level of assist. Patient instructed in exercise to facilitate ROM and circulation to manage edema. B UE and LE edema present at eval.  Patient will benefit from continued skilled PT interventions to address impairments and progress towards PLOF. Acute PT will follow to progress mobility and stair training in preparation for safe discharge home with family support and OPPT services.      Recommendations for follow up therapy are one component of a multi-disciplinary discharge planning process, led by the attending physician.  Recommendations may be updated based on patient status, additional functional criteria and insurance authorization.  Follow Up Recommendations       Assistance Recommended at Discharge Intermittent Supervision/Assistance  Patient can return home with the following  A little help with walking and/or transfers;A little help with bathing/dressing/bathroom;Assistance with cooking/housework;Help with stairs or ramp for entrance;Assist for transportation    Equipment Recommendations Rolling walker (2 wheels)  Recommendations for Other Services       Functional Status Assessment Patient has had a recent decline in their functional  status and demonstrates the ability to make significant improvements in function in a reasonable and predictable amount of time.     Precautions / Restrictions Precautions Precautions: Knee;Fall Restrictions Weight Bearing Restrictions: No      Mobility  Bed Mobility Overal bed mobility: Needs Assistance Bed Mobility: Supine to Sit     Supine to sit: Min guard, HOB elevated     General bed mobility comments: cues    Transfers Overall transfer level: Needs assistance Equipment used: Rolling walker (2 wheels) Transfers: Sit to/from Stand Sit to Stand: Min assist           General transfer comment: cues for proper UE placement    Ambulation/Gait Ambulation/Gait assistance: Min guard Gait Distance (Feet): 36 Feet Assistive device: Rolling walker (2 wheels) Gait Pattern/deviations: Step-to pattern, Antalgic, Trunk flexed Gait velocity: decreased     General Gait Details: mild B toe out, heavy reliance on RW for support, minimal B foot clearance  Stairs            Wheelchair Mobility    Modified Rankin (Stroke Patients Only)       Balance Overall balance assessment: History of Falls, Needs assistance (fall navigating steps) Sitting-balance support: Bilateral upper extremity supported, Feet supported Sitting balance-Leahy Scale: Fair     Standing balance support: Bilateral upper extremity supported, During functional activity, Reliant on assistive device for balance Standing balance-Leahy Scale: Poor                               Pertinent Vitals/Pain Pain Assessment Pain Assessment: 0-10 Pain Score: 4  Pain Location:  R knee (increased to a 4/10 with mobility, 3/10 at rest) Pain Descriptors / Indicators: Aching, Discomfort, Operative site guarding, Dull Pain Intervention(s): Monitored during session, Limited activity within patient's tolerance, Premedicated before session, Repositioned, RN gave pain meds during session, Ice applied  (iceman machine)    Home Living Family/patient expects to be discharged to:: Private residence Living Arrangements: Alone Available Help at Discharge: Family Type of Home: House Home Access: Stairs to enter Entrance Stairs-Rails: Left Entrance Stairs-Number of Steps: 4   Home Layout: One level Home Equipment: Tub bench;Cane - single point;Rollator (4 wheels);Grab bars - tub/shower Additional Comments: daughter will stay with pt s/p d/c home    Prior Function Prior Level of Function : Independent/Modified Independent             Mobility Comments: IND no AD, ADLs, self care tasks, IADLs, driving       Hand Dominance   Dominant Hand: Right    Extremity/Trunk Assessment        Lower Extremity Assessment Lower Extremity Assessment: RLE deficits/detail RLE Deficits / Details: pt reprots bone spurs L ankle and L ankle dysfunction PLOF, in addition pt reports flat acetabula and B hip ER/toe out at baseline, R ankle DF 4/5 and PF 4+/5: SLR < 10 degree lag with encouragement to perform RLE Sensation: WNL    Cervical / Trunk Assessment Cervical / Trunk Assessment:  (head forward)  Communication   Communication: No difficulties  Cognition Arousal/Alertness: Awake/alert Behavior During Therapy: WFL for tasks assessed/performed Overall Cognitive Status: Within Functional Limits for tasks assessed                                          General Comments      Exercises Total Joint Exercises Ankle Circles/Pumps: AROM, Both, 20 reps   Assessment/Plan    PT Assessment Patient needs continued PT services  PT Problem List Decreased strength;Decreased range of motion;Decreased activity tolerance;Decreased balance;Decreased mobility;Decreased coordination;Pain       PT Treatment Interventions DME instruction;Gait training;Stair training;Functional mobility training;Therapeutic activities;Therapeutic exercise;Balance training;Neuromuscular  re-education;Patient/family education;Modalities    PT Goals (Current goals can be found in the Care Plan section)  Acute Rehab PT Goals Patient Stated Goal: to be able to trust the R knee and play pickleball PT Goal Formulation: With patient Time For Goal Achievement: 10/23/22 Potential to Achieve Goals: Good    Frequency 7X/week     Co-evaluation               AM-PAC PT "6 Clicks" Mobility  Outcome Measure Help needed turning from your back to your side while in a flat bed without using bedrails?: A Little Help needed moving from lying on your back to sitting on the side of a flat bed without using bedrails?: A Little Help needed moving to and from a bed to a chair (including a wheelchair)?: A Little Help needed standing up from a chair using your arms (e.g., wheelchair or bedside chair)?: A Little Help needed to walk in hospital room?: A Little Help needed climbing 3-5 steps with a railing? : A Lot 6 Click Score: 17    End of Session Equipment Utilized During Treatment: Gait belt       PT Visit Diagnosis: Unsteadiness on feet (R26.81);Other abnormalities of gait and mobility (R26.89);Muscle weakness (generalized) (M62.81);History of falling (Z91.81);Difficulty in walking, not elsewhere classified (R26.2);Pain Pain - Right/Left: Right  Pain - part of body: Knee    Time: 1739-1820 PT Time Calculation (min) (ACUTE ONLY): 41 min   Charges:   PT Evaluation $PT Eval Low Complexity: 1 Low PT Treatments $Gait Training: 8-22 mins $Therapeutic Activity: 8-22 mins        Johnny Bridge, PT Acute Rehab   Jacqualyn Posey 10/09/2022, 6:26 PM

## 2022-10-09 NOTE — Anesthesia Postprocedure Evaluation (Signed)
Anesthesia Post Note  Patient: Ann Maxwell  Procedure(s) Performed: TOTAL KNEE ARTHROPLASTY (Right: Knee)     Patient location during evaluation: PACU Anesthesia Type: Spinal Level of consciousness: awake and alert Pain management: pain level controlled Vital Signs Assessment: post-procedure vital signs reviewed and stable Respiratory status: spontaneous breathing and respiratory function stable Cardiovascular status: blood pressure returned to baseline and stable Postop Assessment: spinal receding Anesthetic complications: no  No notable events documented.  Last Vitals:  Vitals:   10/09/22 1245 10/09/22 1344  BP: (!) 119/57 127/69  Pulse: (!) 59 72  Resp: 19 20  Temp:    SpO2: 95% 92%    Last Pain:  Vitals:   10/09/22 1344  TempSrc:   PainSc: 0-No pain                 Kennieth Rad

## 2022-10-09 NOTE — Op Note (Signed)
OPERATIVE REPORT-TOTAL KNEE ARTHROPLASTY   Pre-operative diagnosis- Osteoarthritis  Right knee(s)  Post-operative diagnosis- Osteoarthritis Right knee(s)  Procedure-  Right  Total Knee Arthroplasty  Surgeon- Gus Rankin. Angelis Gates, MD  Assistant- Rene Paci, PA-C   Anesthesia-   Adductor canal block and spinal  EBL-50 mL   Drains None  Tourniquet time-  Total Tourniquet Time Documented: Thigh (Right) - 33 minutes Total: Thigh (Right) - 33 minutes     Complications- None  Condition-PACU - hemodynamically stable.   Brief Clinical Note  Ann Maxwell is a 82 y.o. year old female with end stage OA of her right knee with progressively worsening pain and dysfunction. She has constant pain, with activity and at rest and significant functional deficits with difficulties even with ADLs. She has had extensive non-op management including analgesics, injections of cortisone and viscosupplements, and home exercise program, but remains in significant pain with significant dysfunction.Radiographs show bone on bone arthritis lateral and patellofemoral. She presents now for right Total Knee Arthroplasty.     Procedure in detail---   The patient is brought into the operating room and positioned supine on the operating table. After successful administration of  Adductor canal block and spinal,   a tourniquet is placed high on the  Right thigh(s) and the lower extremity is prepped and draped in the usual sterile fashion. Time out is performed by the operating team and then the  Right lower extremity is wrapped in Esmarch, knee flexed and the tourniquet inflated to 300 mmHg.       A midline incision is made with a ten blade through the subcutaneous tissue to the level of the extensor mechanism. A fresh blade is used to make a medial parapatellar arthrotomy. Soft tissue over the proximal medial tibia is subperiosteally elevated to the joint line with a knife and into the semimembranosus bursa with a  Cobb elevator. Soft tissue over the proximal lateral tibia is elevated with attention being paid to avoiding the patellar tendon on the tibial tubercle. The patella is everted, knee flexed 90 degrees and the ACL and PCL are removed. Findings are bone on bone lateral and patellofemoral with large global osteophytes        The drill is used to create a starting hole in the distal femur and the canal is thoroughly irrigated with sterile saline to remove the fatty contents. The 5 degree Right  valgus alignment guide is placed into the femoral canal and the distal femoral cutting block is pinned to remove 9 mm off the distal femur. Resection is made with an oscillating saw.      The tibia is subluxed forward and the menisci are removed. The extramedullary alignment guide is placed referencing proximally at the medial aspect of the tibial tubercle and distally along the second metatarsal axis and tibial crest. The block is pinned to remove 2mm off the more deficient lateral  side. Resection is made with an oscillating saw. Size 6is the most appropriate size for the tibia and the proximal tibia is prepared with the modular drill and keel punch for that size.      The femoral sizing guide is placed and size 6 is most appropriate. Rotation is marked off the epicondylar axis and confirmed by creating a rectangular flexion gap at 90 degrees. The size 6 cutting block is pinned in this rotation and the anterior, posterior and chamfer cuts are made with the oscillating saw. The intercondylar block is then placed and that cut is made.  Trial size 6 tibial component, trial size 6 narrow posterior stabilized femur and a 10  mm posterior stabilized rotating platform insert trial is placed. Full extension is achieved with excellent varus/valgus and anterior/posterior balance throughout full range of motion. The patella is everted and thickness measured to be 24  mm. Free hand resection is taken to 14 mm, a 38 template is  placed, lug holes are drilled, trial patella is placed, and it tracks normally. Osteophytes are removed off the posterior femur with the trial in place. All trials are removed and the cut bone surfaces prepared with pulsatile lavage. Cement is mixed and once ready for implantation, the size 6 tibial implant, size  6 narrow posterior stabilized femoral component, and the size 38 patella are cemented in place and the patella is held with the clamp. The trial insert is placed and the knee held in full extension. The Exparel (20 ml mixed with 60 ml saline) is injected into the extensor mechanism, posterior capsule, medial and lateral gutters and subcutaneous tissues.  All extruded cement is removed and once the cement is hard the permanent 10 mm posterior stabilized rotating platform insert is placed into the tibial tray.      The wound is copiously irrigated with saline solution and the extensor mechanism closed with # 0 Stratofix suture. The tourniquet is released for a total tourniquet time of 33  minutes. Flexion against gravity is 140 degrees and the patella tracks normally. Subcutaneous tissue is closed with 2.0 vicryl and subcuticular with running 4.0 Monocryl. The incision is cleaned and dried and steri-strips and a bulky sterile dressing are applied. The limb is placed into a knee immobilizer and the patient is awakened and transported to recovery in stable condition.      Please note that a surgical assistant was a medical necessity for this procedure in order to perform it in a safe and expeditious manner. Surgical assistant was necessary to retract the ligaments and vital neurovascular structures to prevent injury to them and also necessary for proper positioning of the limb to allow for anatomic placement of the prosthesis.   Gus Rankin Ann Shafran, MD    10/09/2022, 11:18 AM

## 2022-10-09 NOTE — Anesthesia Procedure Notes (Signed)
Spinal  Patient location during procedure: OR Start time: 10/09/2022 10:10 AM End time: 10/09/2022 10:14 AM Reason for block: surgical anesthesia Staffing Performed: anesthesiologist  Anesthesiologist: Marcene Duos, MD Performed by: Marcene Duos, MD Authorized by: Marcene Duos, MD   Preanesthetic Checklist Completed: patient identified, IV checked, site marked, risks and benefits discussed, surgical consent, monitors and equipment checked, pre-op evaluation and timeout performed Spinal Block Patient position: sitting Prep: DuraPrep Patient monitoring: heart rate, cardiac monitor, continuous pulse ox and blood pressure Approach: midline Location: L3-4 Injection technique: single-shot Needle Needle type: Sprotte  Needle gauge: 24 G Needle length: 9 cm Assessment Sensory level: T4 Events: CSF return

## 2022-10-09 NOTE — Transfer of Care (Signed)
Immediate Anesthesia Transfer of Care Note  Patient: Ann Maxwell  Procedure(s) Performed: TOTAL KNEE ARTHROPLASTY (Right: Knee)  Patient Location: PACU  Anesthesia Type:MAC, Regional, and Spinal  Level of Consciousness: awake and alert   Airway & Oxygen Therapy: Patient Spontanous Breathing and Patient connected to face mask oxygen  Post-op Assessment: Report given to RN and Post -op Vital signs reviewed and stable  Post vital signs: Reviewed and stable  Last Vitals:  Vitals Value Taken Time  BP    Temp    Pulse 65 10/09/22 1127  Resp 28 10/09/22 1127  SpO2 100 % 10/09/22 1127  Vitals shown include unvalidated device data.  Last Pain:  Vitals:   10/09/22 1000  TempSrc:   PainSc: 0-No pain         Complications: No notable events documented.

## 2022-10-09 NOTE — Progress Notes (Signed)
Orthopedic Tech Progress Note Patient Details:  Ann Maxwell [birth year] [id number]  CPM Right Knee CPM Right Knee: On Right Knee Flexion (Degrees): 40 Right Knee Extension (Degrees): 10  Post Interventions Patient Tolerated: Well  [name] 10/09/2022, 11:56 AM

## 2022-10-09 NOTE — Interval H&P Note (Signed)
History and Physical Interval Note:  10/09/2022 8:19 AM  Ann Maxwell  has presented today for surgery, with the diagnosis of right knee osteoarthritis.  The various methods of treatment have been discussed with the patient and family. After consideration of risks, benefits and other options for treatment, the patient has consented to  Procedure(s): TOTAL KNEE ARTHROPLASTY (Right) as a surgical intervention.  The patient's history has been reviewed, patient examined, no change in status, stable for surgery.  I have reviewed the patient's chart and labs.  Questions were answered to the patient's satisfaction.     Homero Fellers Qusay Villada

## 2022-10-09 NOTE — Care Plan (Signed)
Ortho Bundle Case Management Note  Patient Details  Name: Ann Maxwell MRN: [id number] Date of Birth: 07/09/1940                  R TKA on 10-09-22  DCP: Home with dtr  DME: RW ordered through [employer]  PT: Fay Records on 10-12-22   DME Arranged:  Dan Humphreys rolling DME Agency:  [employer]    Additional Comments: Please contact me with any questions of if this plan should need to change.   Ennis Forts, RN,CCM [facility]  [phone] 10/09/2022, 2:30 PM

## 2022-10-09 NOTE — Anesthesia Procedure Notes (Signed)
Anesthesia Regional Block: Adductor canal block   Pre-Anesthetic Checklist: , timeout performed,  Correct Patient, Correct Site, Correct Laterality,  Correct Procedure, Correct Position, site marked,  Risks and benefits discussed,  Surgical consent,  Pre-op evaluation,  At surgeon's request and post-op pain management  Laterality: Right  Prep: chloraprep       Needles:  Injection technique: Single-shot  Needle Type: Echogenic Needle     Needle Length: 9cm  Needle Gauge: 21     Additional Needles:   Procedures:,,,, ultrasound used (permanent image in chart),,    Narrative:  Start time: 10/09/2022 9:37 AM End time: 10/09/2022 9:42 AM Injection made incrementally with aspirations every 5 mL.  Performed by: Personally  Anesthesiologist: Marcene Duos, MD

## 2022-10-09 NOTE — Progress Notes (Signed)
Orthopedic Tech Progress Note Patient Details:  Ann Maxwell [birth year] [id number]  CPM Right Knee CPM Right Knee: Off Right Knee Flexion (Degrees): 40 Right Knee Extension (Degrees): 10  Post Interventions Patient Tolerated: Well CPM removed by MD around 1500. [name] 10/09/2022, 3:58 PM

## 2022-10-09 NOTE — Discharge Instructions (Addendum)
 Frank Aluisio, MD Total Joint Specialist [facility] [address] [place], [place] [address] [phone]  TOTAL KNEE REPLACEMENT POSTOPERATIVE DIRECTIONS    Knee Rehabilitation, Guidelines Following Surgery  Results after knee surgery are often greatly improved when you follow the exercise, range of motion and muscle strengthening exercises prescribed by your doctor. Safety measures are also important to protect the knee from further injury. If any of these exercises cause you to have increased pain or swelling in your knee joint, decrease the amount until you are comfortable again and slowly increase them. If you have problems or questions, call your caregiver or physical therapist for advice.    BLOOD CLOT PREVENTION Take a 10 mg Xarelto once a day for three weeks following surgery. Then take an 81 mg Aspirin once a day for three weeks. Then discontinue Aspirin. You may resume your vitamins/supplements once you have discontinued the Xarelto. Do not take any NSAIDs (Advil, Aleve, Ibuprofen, Meloxicam, etc.) until you have discontinued the Xarelto.    HOME CARE INSTRUCTIONS  Remove items at home which could result in a fall. This includes throw rugs or furniture in walking pathways.  ICE to the affected knee as much as tolerated. Icing helps control swelling. If the swelling is well controlled you will be more comfortable and rehab easier. Continue to use ice on the knee for pain and swelling from surgery. You may notice swelling that will progress down to the foot and ankle. This is normal after surgery. Elevate the leg when you are not up walking on it.    Continue to use the breathing machine which will help keep your temperature down. It is common for your temperature to cycle up and down following surgery, especially at night when you are not up moving around and exerting yourself. The breathing machine keeps your lungs expanded and your temperature  down. Do not place pillow under the operative knee, focus on keeping the knee straight while resting  DIET You may resume your previous home diet once you are discharged from the hospital.  DRESSING / WOUND CARE / SHOWERING Keep your bulky bandage on for 2 days. On the third post-operative day you may remove the Ace bandage and gauze. There is a waterproof adhesive bandage on your skin which will stay in place until your first follow-up appointment. Once you remove this you will not need to place another bandage You may begin showering 3 days following surgery, but do not submerge the incision under water.  ACTIVITY For the first 5 days, the key is rest and control of pain and swelling Do your home exercises twice a day starting on post-operative day 3. On the days you go to physical therapy, just do the home exercises once that day. You should rest, ice and elevate the leg for 50 minutes out of every hour. Get up and walk/stretch for 10 minutes per hour. After 5 days you can increase your activity slowly as tolerated. Walk with your walker as instructed. Use the walker until you are comfortable transitioning to a cane. Walk with the cane in the opposite hand of the operative leg. You may discontinue the cane once you are comfortable and walking steadily. Avoid periods of inactivity such as sitting longer than an hour when not asleep. This helps prevent blood clots.  You may discontinue the knee immobilizer once you are able to perform a straight leg raise while lying down. You may resume a sexual relationship in one   month or when given the OK by your doctor.  You may return to work once you are cleared by your doctor.  Do not drive a car for 6 weeks or until released by your surgeon.  Do not drive while taking narcotics.  TED HOSE STOCKINGS Wear the elastic stockings on both legs for three weeks following surgery during the day. You may remove them at night for sleeping.  WEIGHT  BEARING Weight bearing as tolerated with assist device (walker, cane, etc) as directed, use it as long as suggested by your surgeon or therapist, typically at least 4-6 weeks.  POSTOPERATIVE CONSTIPATION PROTOCOL Constipation - defined medically as fewer than three stools per week and severe constipation as less than one stool per week.  One of the most common issues patients have following surgery is constipation.  Even if you have a regular bowel pattern at home, your normal regimen is likely to be disrupted due to multiple reasons following surgery.  Combination of anesthesia, postoperative narcotics, change in appetite and fluid intake all can affect your bowels.  In order to avoid complications following surgery, here are some recommendations in order to help you during your recovery period.  Colace (docusate) - Pick up an over-the-counter form of Colace or another stool softener and take twice a day as long as you are requiring postoperative pain medications.  Take with a full glass of water daily.  If you experience loose stools or diarrhea, hold the colace until you stool forms back up. If your symptoms do not get better within 1 week or if they get worse, check with your doctor. Dulcolax (bisacodyl) - Pick up over-the-counter and take as directed by the product packaging as needed to assist with the movement of your bowels.  Take with a full glass of water.  Use this product as needed if not relieved by Colace only.  MiraLax (polyethylene glycol) - Pick up over-the-counter to have on hand. MiraLax is a solution that will increase the amount of water in your bowels to assist with bowel movements.  Take as directed and can mix with a glass of water, juice, soda, coffee, or tea. Take if you go more than two days without a movement. Do not use MiraLax more than once per day. Call your doctor if you are still constipated or irregular after using this medication for 7 days in a row.  If you continue  to have problems with postoperative constipation, please contact the office for further assistance and recommendations.  If you experience "the worst abdominal pain ever" or develop nausea or vomiting, please contact the office immediatly for further recommendations for treatment.  ITCHING If you experience itching with your medications, try taking only a single pain pill, or even half a pain pill at a time.  You can also use Benadryl over the counter for itching or also to help with sleep.   MEDICATIONS See your medication summary on the "After Visit Summary" that the nursing staff will review with you prior to discharge.  You may have some home medications which will be placed on hold until you complete the course of blood thinner medication.  It is important for you to complete the blood thinner medication as prescribed by your surgeon.  Continue your approved medications as instructed at time of discharge.  PRECAUTIONS If you experience chest pain or shortness of breath - call 911 immediately for transfer to the hospital emergency department.  If you develop a fever greater that 101   F, purulent drainage from wound, increased redness or drainage from wound, foul odor from the wound/dressing, or calf pain - CONTACT YOUR SURGEON.                                                   FOLLOW-UP APPOINTMENTS Make sure you keep all of your appointments after your operation with your surgeon and caregivers. You should call the office at the above phone number and make an appointment for approximately two weeks after the date of your surgery or on the date instructed by your surgeon outlined in the "After Visit Summary".  RANGE OF MOTION AND STRENGTHENING EXERCISES  Rehabilitation of the knee is important following a knee injury or an operation. After just a few days of immobilization, the muscles of the thigh which control the knee become weakened and shrink (atrophy). Knee exercises are designed to build up  the tone and strength of the thigh muscles and to improve knee motion. Often times heat used for twenty to thirty minutes before working out will loosen up your tissues and help with improving the range of motion but do not use heat for the first two weeks following surgery. These exercises can be done on a training (exercise) mat, on the floor, on a table or on a bed. Use what ever works the best and is most comfortable for you Knee exercises include:  Leg Lifts - While your knee is still immobilized in a splint or cast, you can do straight leg raises. Lift the leg to 60 degrees, hold for 3 sec, and slowly lower the leg. Repeat 10-20 times 2-3 times daily. Perform this exercise against resistance later as your knee gets better.  Quad and Hamstring Sets - Tighten up the muscle on the front of the thigh (Quad) and hold for 5-10 sec. Repeat this 10-20 times hourly. Hamstring sets are done by pushing the foot backward against an object and holding for 5-10 sec. Repeat as with quad sets.  Leg Slides: Lying on your back, slowly slide your foot toward your buttocks, bending your knee up off the floor (only go as far as is comfortable). Then slowly slide your foot back down until your leg is flat on the floor again. Angel Wings: Lying on your back spread your legs to the side as far apart as you can without causing discomfort.  A rehabilitation program following serious knee injuries can speed recovery and prevent re-injury in the future due to weakened muscles. Contact your doctor or a physical therapist for more information on knee rehabilitation.   POST-OPERATIVE OPIOID TAPER INSTRUCTIONS: It is important to wean off of your opioid medication as soon as possible. If you do not need pain medication after your surgery it is ok to stop day one. Opioids include: Codeine, Hydrocodone(Norco, Vicodin), Oxycodone(Percocet, oxycontin) and hydromorphone amongst others.  Long term and even short term use of opiods can  cause: Increased pain response Dependence Constipation Depression Respiratory depression And more.  Withdrawal symptoms can include Flu like symptoms Nausea, vomiting And more Techniques to manage these symptoms Hydrate well Eat regular healthy meals Stay active Use relaxation techniques(deep breathing, meditating, yoga) Do Not substitute Alcohol to help with tapering If you have been on opioids for less than two weeks and do not have pain than it is ok to stop all together.    Plan to wean off of opioids This plan should start within one week post op of your joint replacement. Maintain the same interval or time between taking each dose and first decrease the dose.  Cut the total daily intake of opioids by one tablet each day Next start to increase the time between doses. The last dose that should be eliminated is the evening dose.   IF YOU ARE TRANSFERRED TO A SKILLED REHAB FACILITY If the patient is transferred to a skilled rehab facility following release from the hospital, a list of the current medications will be sent to the facility for the patient to continue.  When discharged from the skilled rehab facility, please have the facility set up the patient's Home Health Physical Therapy prior to being released. Also, the skilled facility will be responsible for providing the patient with their medications at time of release from the facility to include their pain medication, the muscle relaxants, and their blood thinner medication. If the patient is still at the rehab facility at time of the two week follow up appointment, the skilled rehab facility will also need to assist the patient in arranging follow up appointment in our office and any transportation needs.  MAKE SURE YOU:  Understand these instructions.  Get help right away if you are not doing well or get worse.   DENTAL ANTIBIOTICS:  In most cases prophylactic antibiotics for Dental procdeures after total joint surgery are  not necessary.  Exceptions are as follows:  1. History of prior total joint infection  2. Severely immunocompromised (Organ Transplant, cancer chemotherapy, Rheumatoid biologic meds such as Humera)  3. Poorly controlled diabetes (A1C &gt; 8.0, blood glucose over 200)  If you have one of these conditions, contact your surgeon for an antibiotic prescription, prior to your dental procedure.    Pick up stool softner and laxative for home use following surgery while on pain medications. Do not submerge incision under water. Please use good hand washing techniques while changing dressing each day. May shower starting three days after surgery. Please use a clean towel to pat the incision dry following showers. Continue to use ice for pain and swelling after surgery. Do not use any lotions or creams on the incision until instructed by your surgeon.      Information on my medicine - XARELTO (Rivaroxaban)  This medication education was reviewed with me or my healthcare representative as part of my discharge preparation.    Why was Xarelto prescribed for you? Xarelto was prescribed for you to reduce the risk of blood clots forming after orthopedic surgery. The medical term for these abnormal blood clots is venous thromboembolism (VTE).  What do you need to know about xarelto ? Take your Xarelto ONCE DAILY at the same time every day. You may take it either with or without food.  If you have difficulty swallowing the tablet whole, you may crush it and mix in applesauce just prior to taking your dose.  Take Xarelto exactly as prescribed by your doctor and DO NOT stop taking Xarelto without talking to the doctor who prescribed the medication.  Stopping without other VTE prevention medication to take the place of Xarelto may increase your risk of developing a clot.  After discharge, you should have regular check-up appointments with your healthcare provider that is prescribing your  Xarelto.    What do you do if you miss a dose? If you miss a dose, take it as soon as you remember on the same   day then continue your regularly scheduled once daily regimen the next day. Do not take two doses of Xarelto on the same day.   Important Safety Information A possible side effect of Xarelto is bleeding. You should call your healthcare provider right away if you experience any of the following: Bleeding from an injury or your nose that does not stop. Unusual colored urine (red or dark brown) or unusual colored stools (red or black). Unusual bruising for unknown reasons. A serious fall or if you hit your head (even if there is no bleeding).  Some medicines may interact with Xarelto and might increase your risk of bleeding while on Xarelto. To help avoid this, consult your healthcare provider or pharmacist prior to using any new prescription or non-prescription medications, including herbals, vitamins, non-steroidal anti-inflammatory drugs (NSAIDs) and supplements.  This website has more information on Xarelto: www.xarelto.com.   

## 2022-10-10 ENCOUNTER — Encounter (HOSPITAL_COMMUNITY): Payer: Self-pay | Admitting: Orthopedic Surgery

## 2022-10-10 DIAGNOSIS — Z96652 Presence of left artificial knee joint: Secondary | ICD-10-CM | POA: Diagnosis not present

## 2022-10-10 DIAGNOSIS — J45909 Unspecified asthma, uncomplicated: Secondary | ICD-10-CM | POA: Diagnosis not present

## 2022-10-10 DIAGNOSIS — Z85038 Personal history of other malignant neoplasm of large intestine: Secondary | ICD-10-CM | POA: Diagnosis not present

## 2022-10-10 DIAGNOSIS — Z96651 Presence of right artificial knee joint: Secondary | ICD-10-CM | POA: Diagnosis not present

## 2022-10-10 DIAGNOSIS — Z853 Personal history of malignant neoplasm of breast: Secondary | ICD-10-CM | POA: Diagnosis not present

## 2022-10-10 DIAGNOSIS — M1711 Unilateral primary osteoarthritis, right knee: Secondary | ICD-10-CM | POA: Diagnosis not present

## 2022-10-10 DIAGNOSIS — Z79899 Other long term (current) drug therapy: Secondary | ICD-10-CM | POA: Diagnosis not present

## 2022-10-10 LAB — BASIC METABOLIC PANEL
Anion gap: 10 (ref 5–15)
BUN: 12 mg/dL (ref 8–23)
CO2: 24 mmol/L (ref 22–32)
Calcium: 8.5 mg/dL — ABNORMAL LOW (ref 8.9–10.3)
Chloride: 101 mmol/L (ref 98–111)
Creatinine, Ser: 0.51 mg/dL (ref 0.44–1.00)
GFR, Estimated: >60 mL/min (ref >60)
Glucose, Bld: 134 mg/dL — ABNORMAL HIGH (ref 70–99)
Potassium: 3.5 mmol/L (ref 3.5–5.1)
Sodium: 135 mmol/L (ref 135–145)

## 2022-10-10 LAB — CBC
HCT: 39.4 % (ref 36.0–46.0)
Hemoglobin: 12.8 g/dL (ref 12.0–15.0)
MCH: 30.6 pg (ref 26.0–34.0)
MCHC: 32.5 g/dL (ref 30.0–36.0)
MCV: 94.3 fL (ref 80.0–100.0)
Platelets: 200 10*3/uL (ref 150–400)
RBC: 4.18 MIL/uL (ref 3.87–5.11)
RDW: 12.5 % (ref 11.5–15.5)
WBC: 13.8 10*3/uL — ABNORMAL HIGH (ref 4.0–10.5)
nRBC: 0 % (ref 0.0–0.2)

## 2022-10-10 MED ORDER — METHOCARBAMOL 500 MG PO TABS: 500.0000 mg | ORAL_TABLET | Freq: Four times a day (QID) | ORAL | 0 refills | Status: DC | PRN

## 2022-10-10 MED ORDER — TRAMADOL HCL 50 MG PO TABS: 50.0000 mg | ORAL_TABLET | Freq: Four times a day (QID) | ORAL | 0 refills | Status: DC | PRN

## 2022-10-10 MED ORDER — ONDANSETRON HCL 4 MG PO TABS: 4.0000 mg | ORAL_TABLET | Freq: Four times a day (QID) | ORAL | 0 refills | Status: DC | PRN

## 2022-10-10 MED ORDER — RIVAROXABAN 10 MG PO TABS: 10.0000 mg | ORAL_TABLET | Freq: Every day | ORAL | 0 refills | Status: AC

## 2022-10-10 MED ORDER — HYDROCODONE-ACETAMINOPHEN 5-325 MG PO TABS: 1.0000 | ORAL_TABLET | Freq: Four times a day (QID) | ORAL | 0 refills | Status: DC | PRN

## 2022-10-10 MED ORDER — UMECLIDINIUM-VILANTEROL 62.5-25 MCG/ACT IN AEPB
1.0000 | INHALATION_SPRAY | Freq: Every day | RESPIRATORY_TRACT | Status: DC
  Filled 2022-10-10: qty 14

## 2022-10-10 NOTE — Progress Notes (Signed)
Physical Therapy Treatment Patient Details Name: Ann Maxwell MRN: [id number] DOB: [birth year] Today's Date: 10/10/2022   History of Present Illness 82 yo female presents to therapy s/p R TKA on 10/09/2022 due to failure of conservative measures. Pt PMH includes but is not limited to: R  and L ba ca s/p B mastectomies, asthma, colon ca, anemia, angina, sinus tachycardia, second degree AV block. HTN, vertigo, and L TKA.    PT Comments    POD # 1 am session Pt OOB in recliner.  Daughter in room.  Assisted with amb in hallway and practiced stairs.  Then returned to room to perform some TE's following HEP handout.  Instructed on proper tech, freq as well as use of ICE.   Addressed all mobility questions, discussed appropriate activity, educated on use of ICE.  Pt ready for D/C to home.   Recommendations for follow up therapy are one component of a multi-disciplinary discharge planning process, led by the attending physician.  Recommendations may be updated based on patient status, additional functional criteria and insurance authorization.  Follow Up Recommendations       Assistance Recommended at Discharge Intermittent Supervision/Assistance  Patient can return home with the following A little help with walking and/or transfers;A little help with bathing/dressing/bathroom;Assistance with cooking/housework;Help with stairs or ramp for entrance;Assist for transportation   Equipment Recommendations  Rolling walker (2 wheels)    Recommendations for Other Services       Precautions / Restrictions Precautions Precautions: Knee;Fall Precaution Comments: no pillow under knee Restrictions Weight Bearing Restrictions: No Other Position/Activity Restrictions: WBAT     Mobility  Bed Mobility               General bed mobility comments: OOB in recliner    Transfers Overall transfer level: Needs assistance Equipment used: Rolling walker (2 wheels) Transfers: Sit to/from  Stand Sit to Stand: Supervision, Min guard           General transfer comment: cues for proper UE placement    Ambulation/Gait Ambulation/Gait assistance: Supervision, Min guard Gait Distance (Feet): 43 Feet Assistive device: Rolling walker (2 wheels) Gait Pattern/deviations: Step-to pattern, Antalgic, Trunk flexed Gait velocity: decreased     General Gait Details: VC's on safety with turns   Stairs Stairs: Yes Stairs assistance: Supervision, Min guard Stair Management: One rail Left, Step to pattern, Forwards, Sideways Number of Stairs: 4 General stair comments: VC's on proper tech, sequencing and safety.  Daughter asissted "hands on" using safety belt.   Wheelchair Mobility    Modified Rankin (Stroke Patients Only)       Balance                                            Cognition Arousal/Alertness: Awake/alert Behavior During Therapy: WFL for tasks assessed/performed Overall Cognitive Status: Within Functional Limits for tasks assessed                                 General Comments: AxO x 3 very pleasant.  Had her "other" knee replaced but that was in 2017.        Exercises  Total Knee Replacement TE's following HEP handout 10 reps B LE ankle pumps 05 reps towel squeezes 05 reps knee presses 05 reps heel slides  05 reps SAQ's 05 reps SLR's  05 reps ABD Educated on use of gait belt to assist with TE's Followed by ICE     General Comments        Pertinent Vitals/Pain Pain Assessment Pain Assessment: 0-10 Pain Score: 5  Pain Location: R knee Pain Descriptors / Indicators: Aching, Discomfort, Operative site guarding, Dull Pain Intervention(s): Monitored during session, Premedicated before session, Repositioned, Ice applied    Home Living                          Prior Function            PT Goals (current goals can now be found in the care plan section) Progress towards PT goals: Progressing  toward goals    Frequency    7X/week      PT Plan Current plan remains appropriate    Co-evaluation              AM-PAC PT "6 Clicks" Mobility   Outcome Measure  Help needed turning from your back to your side while in a flat bed without using bedrails?: None Help needed moving from lying on your back to sitting on the side of a flat bed without using bedrails?: None Help needed moving to and from a bed to a chair (including a wheelchair)?: None Help needed standing up from a chair using your arms (e.g., wheelchair or bedside chair)?: A Little Help needed to walk in hospital room?: A Little Help needed climbing 3-5 steps with a railing? : A Little 6 Click Score: 21    End of Session Equipment Utilized During Treatment: Gait belt Activity Tolerance: Patient tolerated treatment well Patient left: in chair;with call bell/phone within reach;with family/visitor present Nurse Communication: Mobility status PT Visit Diagnosis: Unsteadiness on feet (R26.81);Other abnormalities of gait and mobility (R26.89);Muscle weakness (generalized) (M62.81);History of falling (Z91.81);Difficulty in walking, not elsewhere classified (R26.2);Pain Pain - Right/Left: Right Pain - part of body: Knee     Time: 8119-1478 PT Time Calculation (min) (ACUTE ONLY): 34 min  Charges:  $Gait Training: 8-22 mins $Therapeutic Exercise: 8-22 mins                     Felecia Shelling  PTA Acute  Rehabilitation Services Office M-F          [phone]

## 2022-10-10 NOTE — Care Management Obs Status (Signed)
MEDICARE OBSERVATION STATUS NOTIFICATION   Patient Details  Name: Ann Maxwell MRN: [id number] Date of Birth: 15-Aug-1940   Medicare Observation Status Notification Given:  Hart Robinsons, LCSW 10/10/2022, 10:33 AM

## 2022-10-10 NOTE — TOC Transition Note (Signed)
Transition of Care [facility]) - CM/SW Discharge Note   Patient Details  Name: Ann Maxwell MRN: [id number] Date of Birth: 1940/05/28  Transition of Care [facility]) CM/SW Contact:  Amada Jupiter, LCSW Phone Number: 10/10/2022, 11:31 AM   Clinical Narrative:     Met with pt who confirms RW delivered to room via [employer].  OPPT already arranged with [facility]).  No further TOC needs.  Final next level of care: OP Rehab Barriers to Discharge: No Barriers Identified   Patient Goals and CMS Choice      Discharge Placement                         Discharge Plan and Services Additional resources added to the After Visit Summary for                  DME Arranged: Walker rolling DME Agency: [employer]                  Social Determinants of Health (SDOH) Interventions SDOH Screenings   Food Insecurity: No Food Insecurity (10/09/2022)  Housing: Low Risk  (10/09/2022)  Transportation Needs: No Transportation Needs (10/09/2022)  Utilities: Not At Risk (10/09/2022)  Tobacco Use: Low Risk  (10/09/2022)     Readmission Risk Interventions     No data to display

## 2022-10-10 NOTE — Progress Notes (Signed)
Subjective: 1 Day Post-Op Procedure(s) (LRB): TOTAL KNEE ARTHROPLASTY (Right) Patient reports pain as mild.   Patient seen in rounds by Dr. Lequita Halt. Patient is well, and has had no acute complaints or problems No issues overnight. Denies chest pain, SOB, or calf pain. Foley catheter removed this AM.  We will continue therapy today, ambulated 71' yesterday.   Objective: Vital signs in last 24 hours: Temp:  [97.5 F (36.4 C)-98.2 F (36.8 C)] 97.6 F (36.4 C) (06/25 0620) Pulse Rate:  [59-80] 60 (06/25 0620) Resp:  [15-20] 18 (06/25 0620) BP: (92-166)/(57-87) 133/57 (06/25 0620) SpO2:  [92 %-100 %] 100 % (06/25 0620) Weight:  [90 kg] 90 kg (06/24 0834)  Intake/Output from previous day:  Intake/Output Summary (Last 24 hours) at 10/10/2022 0801 Last data filed at 10/10/2022 0619 Gross per 24 hour  Intake 2829.08 ml  Output 3300 ml  Net -470.92 ml     Intake/Output this shift: No intake/output data recorded.  Labs: Recent Labs    10/10/22 0616  HGB 12.8   Recent Labs    10/10/22 0616  WBC 13.8*  RBC 4.18  HCT 39.4  PLT 200   Recent Labs    10/10/22 0616  NA 135  K 3.5  CL 101  CO2 24  BUN 12  CREATININE 0.51  GLUCOSE 134*  CALCIUM 8.5*   No results for input(s): "LABPT", "INR" in the last 72 hours.  Exam: General - Patient is Alert and Oriented Extremity - Neurologically intact Neurovascular intact Sensation intact distally Dorsiflexion/Plantar flexion intact Dressing - dressing C/D/I Motor Function - intact, moving foot and toes well on exam.   Past Medical History:  Diagnosis Date   Allergy    Anemia    iron infusion -last done 1 month ago(12-15, and 12-22 -15 [facility])- Dr. Pamelia Hoit   Anxiety    Asthma    Breast CA [facility]) 2013   a. L breast DCIS, s/p L mastectomy 12/2011. no further issues   Breast cancer (HCC) 2023   Right breast IDC   Cataract    bilat removed    Colon cancer (HCC)    Depression    pt denies any hx of depression     Environmental allergies    History of kidney stones    Hypertension    controlled on meds    Internal hemorrhoids    remains an issue- no bright bleeding seen recent   Iron deficiency anemia    Obesity    Osteoarthritis    arthritis- knees and shoulders   Osteoporosis    drug induced and on Fosamax   Port-A-Cath in place 08/01/2021   Transfusion history    2 yrs ago due to anemia   Vertigo    no recent issues    Assessment/Plan: 1 Day Post-Op Procedure(s) (LRB): TOTAL KNEE ARTHROPLASTY (Right) Principal Problem:   OA (osteoarthritis) of knee Active Problems:   Osteoarthritis of right knee  Estimated body mass index is 34.6 kg/m as calculated from the following:   Height as of this encounter: 5' 3.5" (1.613 m).   Weight as of this encounter: 90 kg. Advance diet Up with therapy D/C IV fluids   Patient's anticipated LOS is less than 2 midnights, meeting these requirements: - Lives within 1 hour of care - Has a competent adult at home to recover with post-op recover - NO history of  - Chronic pain requiring opioids  - Diabetes  - Coronary Artery Disease  - Heart failure  -  Heart attack  - Stroke  - DVT/VTE  - Cardiac arrhythmia  - Respiratory Failure/COPD  - Renal failure  - Anemia  - Advanced Liver disease  DVT Prophylaxis - Xarelto Weight bearing as tolerated. Continue therapy.  Plan is to go Home after hospital stay. Plan for discharge later today if progresses with therapy and meeting goals. Scheduled for OPPT at [facility]). Follow-up in the office in 2 weeks.  The PDMP database was reviewed today prior to any opioid medications being prescribed to this patient.  Arther Abbott, PA-C Orthopedic Surgery [phone] 10/10/2022, 8:01 AM

## 2022-10-12 DIAGNOSIS — M25561 Pain in right knee: Secondary | ICD-10-CM | POA: Diagnosis not present

## 2022-10-12 DIAGNOSIS — M25661 Stiffness of right knee, not elsewhere classified: Secondary | ICD-10-CM | POA: Diagnosis not present

## 2022-10-16 DIAGNOSIS — M25661 Stiffness of right knee, not elsewhere classified: Secondary | ICD-10-CM | POA: Diagnosis not present

## 2022-10-16 DIAGNOSIS — M25561 Pain in right knee: Secondary | ICD-10-CM | POA: Diagnosis not present

## 2022-10-16 NOTE — Discharge Summary (Signed)
Patient ID: Ann Maxwell MRN: [id number] DOB/AGE: 80-Sep-1942 80 y.o.  Admit date: 10/09/2022 Discharge date: 10/10/2022  Admission Diagnoses:  Principal Problem:   OA (osteoarthritis) of knee Active Problems:   Osteoarthritis of right knee   Discharge Diagnoses:  Same  Past Medical History:  Diagnosis Date   Allergy    Anemia    iron infusion -last done 1 month ago(12-15, and 12-22 -15 [facility])- Dr. Pamelia Hoit   Anxiety    Asthma    Breast CA (HCC) 2013   a. L breast DCIS, s/p L mastectomy 12/2011. no further issues   Breast cancer (HCC) 2023   Right breast IDC   Cataract    bilat removed    Colon cancer (HCC)    Depression    pt denies any hx of depression    Environmental allergies    History of kidney stones    Hypertension    controlled on meds    Internal hemorrhoids    remains an issue- no bright bleeding seen recent   Iron deficiency anemia    Obesity    Osteoarthritis    arthritis- knees and shoulders   Osteoporosis    drug induced and on Fosamax   Port-A-Cath in place 08/01/2021   Transfusion history    2 yrs ago due to anemia   Vertigo    no recent issues    Surgeries: Procedure(s): TOTAL KNEE ARTHROPLASTY on 10/09/2022   Consultants:   Discharged Condition: Improved  Hospital Course: NABA BUONOCORE is an 82 y.o. female who was admitted 10/09/2022 for operative treatment ofOA (osteoarthritis) of knee. Patient has severe unremitting pain that affects sleep, daily activities, and work/hobbies. After pre-op clearance the patient was taken to the operating room on 10/09/2022 and underwent  Procedure(s): TOTAL KNEE ARTHROPLASTY.    Patient was given perioperative antibiotics:  Anti-infectives (From admission, onward)    Start     Dose/Rate Route Frequency Ordered Stop   10/09/22 1600  ceFAZolin (ANCEF) IVPB 2g/100 mL premix        2 g 200 mL/hr over 30 Minutes Intravenous Every 6 hours 10/09/22 1340 10/09/22 2145   10/09/22 0815  ceFAZolin (ANCEF) IVPB  2g/100 mL premix        2 g 200 mL/hr over 30 Minutes Intravenous On call to O.R. 10/09/22 7829 10/09/22 1045        Patient was given sequential compression devices, early ambulation, and chemoprophylaxis to prevent DVT.  Patient benefited maximally from hospital stay and there were no complications.    Recent vital signs: No data found.   Recent laboratory studies: No results for input(s): "WBC", "HGB", "HCT", "PLT", "NA", "K", "CL", "CO2", "BUN", "CREATININE", "GLUCOSE", "INR", "CALCIUM" in the last 72 hours.  Invalid input(s): "PT", "2"   Discharge Medications:   Allergies as of 10/10/2022       Reactions   Other Shortness Of Breath, Other (See Comments)   Tree and shrub pollen    Pollen Extract Other (See Comments)   Tape Other (See Comments), Rash   Surgical tape causes blisters   Silvadene [silver Sulfadiazine]    Turns skin red   Tetracycline Other (See Comments)   Tyloxapol Nausea And Vomiting   Tylox   Terramycin [oxytetracycline] Rash        Medication List     STOP taking these medications    amoxicillin-clavulanate 875-125 MG tablet Commonly known as: AUGMENTIN   Calcium + Vitamin D3 600-5 MG-MCG Tabs Generic drug: Calcium Carb-Cholecalciferol  gabapentin 100 MG capsule Commonly known as: Neurontin   Vitamin D (Ergocalciferol) 1.25 MG (50000 UNIT) Caps capsule Commonly known as: DRISDOL       TAKE these medications    acetaminophen 650 MG CR tablet Commonly known as: TYLENOL Take 650 mg by mouth every 8 (eight) hours as needed for pain.   acidophilus Caps capsule Take 1 capsule by mouth daily.   Advair Diskus 250-50 MCG/ACT Aepb Generic drug: fluticasone-salmeterol Inhale 1 puff into the lungs 2 (two) times daily.   albuterol 108 (90 Base) MCG/ACT inhaler Commonly known as: VENTOLIN HFA Inhale 2 puffs into the lungs every 6 (six) hours as needed for wheezing or shortness of breath.   alendronate 70 MG tablet Commonly known as:  Fosamax Take 1 tablet (70 mg total) by mouth once a week. Take with a full glass of water on an empty stomach. What changed: when to take this   cephALEXin 500 MG capsule Commonly known as: KEFLEX Take 500 mg by mouth every 8 (eight) hours.   hydrochlorothiazide 12.5 MG capsule Commonly known as: MICROZIDE Take 12.5 mg by mouth daily.   HYDROcodone-acetaminophen 5-325 MG tablet Commonly known as: NORCO/VICODIN Take 1-2 tablets by mouth every 6 (six) hours as needed for severe pain.   hydrocortisone 2.5 % rectal cream Commonly known as: ANUSOL-HC USE 1 application rectally 2 TIMES DAILY What changed: See the new instructions.   loratadine 10 MG tablet Commonly known as: CLARITIN Take 10 mg by mouth daily as needed for allergies.   losartan 50 MG tablet Commonly known as: COZAAR Take 50 mg by mouth every evening.   melatonin 5 MG Tabs Take 5 mg by mouth at bedtime as needed (sleep).   methocarbamol 500 MG tablet Commonly known as: ROBAXIN Take 1 tablet (500 mg total) by mouth every 6 (six) hours as needed for muscle spasms.   montelukast 10 MG tablet Commonly known as: SINGULAIR Take 10 mg by mouth at bedtime.   ondansetron 4 MG tablet Commonly known as: ZOFRAN Take 1 tablet (4 mg total) by mouth every 6 (six) hours as needed for nausea.   polyvinyl alcohol 1.4 % ophthalmic solution Commonly known as: LIQUIFILM TEARS Place 2 drops into both eyes as needed for dry eyes.   rivaroxaban 10 MG Tabs tablet Commonly known as: XARELTO Take 1 tablet (10 mg total) by mouth daily with breakfast for 20 days. Then take one 81 mg aspirin once a day for three weeks. Then discontinue aspirin.   traMADol 50 MG tablet Commonly known as: ULTRAM Take 1-2 tablets (50-100 mg total) by mouth every 6 (six) hours as needed for moderate pain.               Discharge Care Instructions  (From admission, onward)           Start     Ordered   10/10/22 0000  Weight bearing as  tolerated        10/10/22 0803   10/10/22 0000  Change dressing       Comments: You may remove the bulky bandage (ACE wrap and gauze) two days after surgery. You will have an adhesive waterproof bandage underneath. Leave this in place until your first follow-up appointment.   10/10/22 0803            Diagnostic Studies: CT Chest W Contrast  Result Date: 10/01/2022 CLINICAL DATA:  Lung nodule.  History of right breast cancer. EXAM: CT CHEST WITH CONTRAST TECHNIQUE: Multidetector CT  imaging of the chest was performed during intravenous contrast administration. RADIATION DOSE REDUCTION: This exam was performed according to the departmental dose-optimization program which includes automated exposure control, adjustment of the mA and/or kV according to patient size and/or use of iterative reconstruction technique. CONTRAST:  75mL OMNIPAQUE IOHEXOL 300 MG/ML  SOLN COMPARISON:  CT chest 05/01/2022 FINDINGS: Cardiovascular: There is aneurysmal dilatation of the ascending aorta measuring 4.3 cm (previously 4.1 cm). Heart is normal in size. There is no pericardial effusion. Mediastinum/Nodes: No enlarged mediastinal, hilar, or axillary lymph nodes. Thyroid gland, trachea, and esophagus demonstrate no significant findings. Lungs/Pleura: Linear areas of opacities in the anterior right upper lobe are again seen. Nodular components have decreased. 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 There is no new focal lung infiltrate, pleural effusion or pneumothorax. Scattered nodular densities in the right upper lobe measuring up to 4 mm appear unchanged. Right lower lobe nodules measuring up to 5 mm appear unchanged. There is a band of atelectasis or scarring in the left lower lobe, unchanged. 2 mm left upper lobe nodule image 6/56 is unchanged. No new pulmonary nodules are seen. No pleural effusion or pneumothorax. Upper Abdomen: No acute abnormality. Cholecystectomy clips are present. Musculoskeletal: Right  mastectomy changes are present. No acute fracture or focal osseous lesion. Degenerative changes affect the spine. IMPRESSION: 1. 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 2. Multiple bilateral pulmonary nodules measuring up to 5 mm are unchanged. No new pulmonary nodules. 3. 4.3 cm ascending aortic aneurysm. Recommend annual imaging followup by CTA or MRA. This recommendation follows 2010 ACCF/AHA/AATS/ACR/ASA/SCA/SCAI/SIR/STS/SVM Guidelines for the Diagnosis and Management of Patients with Thoracic Aortic Disease. Circulation. 2010; 121: Z610-R604. Aortic aneurysm NOS (ICD10-I71.9) 4. Right mastectomy changes. Electronically Signed   By: Darliss Cheney M.D.   On: 10/01/2022 19:55    Disposition: Discharge disposition: 01-Home or Self Care       Discharge Instructions     Call MD / Call 911   Complete by: As directed    If you experience chest pain or shortness of breath, CALL 911 and be transported to the hospital emergency room.  If you develope a fever above 101 F, pus (white drainage) or increased drainage or redness at the wound, or calf pain, call your surgeon's office.   Change dressing   Complete by: As directed    You may remove the bulky bandage (ACE wrap and gauze) two days after surgery. You will have an adhesive waterproof bandage underneath. Leave this in place until your first follow-up appointment.   Constipation Prevention   Complete by: As directed    Drink plenty of fluids.  Prune juice may be helpful.  You may use a stool softener, such as Colace (over the counter) 100 mg twice a day.  Use MiraLax (over the counter) for constipation as needed.   Diet - low sodium heart healthy   Complete by: As directed    Do not put a pillow under the knee. Place it under the heel.   Complete by: As directed    Driving restrictions   Complete by: As directed    No driving for two weeks   Post-operative opioid taper  instructions:   Complete by: As directed    POST-OPERATIVE OPIOID TAPER INSTRUCTIONS: It is important to wean off of your opioid medication as soon as possible. If you do not need pain medication after your surgery it is ok to stop day one. Opioids  include: Codeine, Hydrocodone(Norco, Vicodin), Oxycodone(Percocet, oxycontin) and hydromorphone amongst others.  Long term and even short term use of opiods can cause: Increased pain response Dependence Constipation Depression Respiratory depression And more.  Withdrawal symptoms can include Flu like symptoms Nausea, vomiting And more Techniques to manage these symptoms Hydrate well Eat regular healthy meals Stay active Use relaxation techniques(deep breathing, meditating, yoga) Do Not substitute Alcohol to help with tapering If you have been on opioids for less than two weeks and do not have pain than it is ok to stop all together.  Plan to wean off of opioids This plan should start within one week post op of your joint replacement. Maintain the same interval or time between taking each dose and first decrease the dose.  Cut the total daily intake of opioids by one tablet each day Next start to increase the time between doses. The last dose that should be eliminated is the evening dose.      TED hose   Complete by: As directed    Use stockings (TED hose) for three weeks on both leg(s).  You may remove them at night for sleeping.   Weight bearing as tolerated   Complete by: As directed         Follow-up Information     Ollen Gross, MD. Go on 10/25/2022.   Specialty: Orthopedic Surgery Why: You are scheduled for first post op appt on Wednesday July 10 at 4:00pm. Contact information: [address] [place] [address] [phone]                  Signed: Arther Abbott 10/16/2022, 7:25 AM

## 2022-10-18 DIAGNOSIS — M25561 Pain in right knee: Secondary | ICD-10-CM | POA: Diagnosis not present

## 2022-10-18 DIAGNOSIS — M25661 Stiffness of right knee, not elsewhere classified: Secondary | ICD-10-CM | POA: Diagnosis not present

## 2022-10-23 DIAGNOSIS — M25561 Pain in right knee: Secondary | ICD-10-CM | POA: Diagnosis not present

## 2022-10-23 DIAGNOSIS — M25661 Stiffness of right knee, not elsewhere classified: Secondary | ICD-10-CM | POA: Diagnosis not present

## 2022-10-25 DIAGNOSIS — M25561 Pain in right knee: Secondary | ICD-10-CM | POA: Diagnosis not present

## 2022-10-25 DIAGNOSIS — M25661 Stiffness of right knee, not elsewhere classified: Secondary | ICD-10-CM | POA: Diagnosis not present

## 2022-10-27 DIAGNOSIS — M25561 Pain in right knee: Secondary | ICD-10-CM | POA: Diagnosis not present

## 2022-10-27 DIAGNOSIS — M25661 Stiffness of right knee, not elsewhere classified: Secondary | ICD-10-CM | POA: Diagnosis not present

## 2022-10-31 DIAGNOSIS — M25561 Pain in right knee: Secondary | ICD-10-CM | POA: Diagnosis not present

## 2022-10-31 DIAGNOSIS — M25661 Stiffness of right knee, not elsewhere classified: Secondary | ICD-10-CM | POA: Diagnosis not present

## 2022-11-02 DIAGNOSIS — M25561 Pain in right knee: Secondary | ICD-10-CM | POA: Diagnosis not present

## 2022-11-02 DIAGNOSIS — M25661 Stiffness of right knee, not elsewhere classified: Secondary | ICD-10-CM | POA: Diagnosis not present

## 2022-11-07 DIAGNOSIS — M25561 Pain in right knee: Secondary | ICD-10-CM | POA: Diagnosis not present

## 2022-11-07 DIAGNOSIS — M25661 Stiffness of right knee, not elsewhere classified: Secondary | ICD-10-CM | POA: Diagnosis not present

## 2022-11-09 DIAGNOSIS — M25561 Pain in right knee: Secondary | ICD-10-CM | POA: Diagnosis not present

## 2022-11-09 DIAGNOSIS — M25661 Stiffness of right knee, not elsewhere classified: Secondary | ICD-10-CM | POA: Diagnosis not present

## 2022-11-14 DIAGNOSIS — M25661 Stiffness of right knee, not elsewhere classified: Secondary | ICD-10-CM | POA: Diagnosis not present

## 2022-11-14 DIAGNOSIS — M25561 Pain in right knee: Secondary | ICD-10-CM | POA: Diagnosis not present

## 2022-11-17 DIAGNOSIS — M25561 Pain in right knee: Secondary | ICD-10-CM | POA: Diagnosis not present

## 2022-11-21 DIAGNOSIS — M25561 Pain in right knee: Secondary | ICD-10-CM | POA: Diagnosis not present

## 2022-11-21 DIAGNOSIS — M25661 Stiffness of right knee, not elsewhere classified: Secondary | ICD-10-CM | POA: Diagnosis not present

## 2022-11-21 DIAGNOSIS — M545 Low back pain, unspecified: Secondary | ICD-10-CM | POA: Diagnosis not present

## 2022-11-21 DIAGNOSIS — Z5189 Encounter for other specified aftercare: Secondary | ICD-10-CM | POA: Diagnosis not present

## 2022-11-29 DIAGNOSIS — M25661 Stiffness of right knee, not elsewhere classified: Secondary | ICD-10-CM | POA: Diagnosis not present

## 2022-11-29 DIAGNOSIS — M25561 Pain in right knee: Secondary | ICD-10-CM | POA: Diagnosis not present

## 2022-12-01 DIAGNOSIS — M25661 Stiffness of right knee, not elsewhere classified: Secondary | ICD-10-CM | POA: Diagnosis not present

## 2022-12-01 DIAGNOSIS — M25561 Pain in right knee: Secondary | ICD-10-CM | POA: Diagnosis not present

## 2022-12-15 DIAGNOSIS — M545 Low back pain, unspecified: Secondary | ICD-10-CM | POA: Diagnosis not present

## 2022-12-27 DIAGNOSIS — Z96652 Presence of left artificial knee joint: Secondary | ICD-10-CM | POA: Diagnosis not present

## 2022-12-27 DIAGNOSIS — M25562 Pain in left knee: Secondary | ICD-10-CM | POA: Diagnosis not present

## 2023-01-09 DIAGNOSIS — H52203 Unspecified astigmatism, bilateral: Secondary | ICD-10-CM | POA: Diagnosis not present

## 2023-01-09 DIAGNOSIS — Z961 Presence of intraocular lens: Secondary | ICD-10-CM | POA: Diagnosis not present

## 2023-05-17 DIAGNOSIS — M810 Age-related osteoporosis without current pathological fracture: Secondary | ICD-10-CM | POA: Diagnosis not present

## 2023-05-17 DIAGNOSIS — D649 Anemia, unspecified: Secondary | ICD-10-CM | POA: Diagnosis not present

## 2023-05-17 DIAGNOSIS — E785 Hyperlipidemia, unspecified: Secondary | ICD-10-CM | POA: Diagnosis not present

## 2023-05-17 DIAGNOSIS — D509 Iron deficiency anemia, unspecified: Secondary | ICD-10-CM | POA: Diagnosis not present

## 2023-05-17 DIAGNOSIS — I10 Essential (primary) hypertension: Secondary | ICD-10-CM | POA: Diagnosis not present

## 2023-05-24 DIAGNOSIS — I700 Atherosclerosis of aorta: Secondary | ICD-10-CM | POA: Diagnosis not present

## 2023-05-24 DIAGNOSIS — J45909 Unspecified asthma, uncomplicated: Secondary | ICD-10-CM | POA: Diagnosis not present

## 2023-05-24 DIAGNOSIS — Z853 Personal history of malignant neoplasm of breast: Secondary | ICD-10-CM | POA: Diagnosis not present

## 2023-05-24 DIAGNOSIS — M810 Age-related osteoporosis without current pathological fracture: Secondary | ICD-10-CM | POA: Diagnosis not present

## 2023-05-24 DIAGNOSIS — I10 Essential (primary) hypertension: Secondary | ICD-10-CM | POA: Diagnosis not present

## 2023-05-24 DIAGNOSIS — E669 Obesity, unspecified: Secondary | ICD-10-CM | POA: Diagnosis not present

## 2023-05-24 DIAGNOSIS — E785 Hyperlipidemia, unspecified: Secondary | ICD-10-CM | POA: Diagnosis not present

## 2023-05-24 DIAGNOSIS — Z0000 Encounter for general adult medical examination without abnormal findings: Secondary | ICD-10-CM | POA: Diagnosis not present

## 2023-05-24 DIAGNOSIS — F5104 Psychophysiologic insomnia: Secondary | ICD-10-CM | POA: Diagnosis not present

## 2023-05-24 DIAGNOSIS — R82998 Other abnormal findings in urine: Secondary | ICD-10-CM | POA: Diagnosis not present

## 2023-06-05 DIAGNOSIS — L812 Freckles: Secondary | ICD-10-CM | POA: Diagnosis not present

## 2023-06-05 DIAGNOSIS — D2262 Melanocytic nevi of left upper limb, including shoulder: Secondary | ICD-10-CM | POA: Diagnosis not present

## 2023-06-05 DIAGNOSIS — L718 Other rosacea: Secondary | ICD-10-CM | POA: Diagnosis not present

## 2023-06-05 DIAGNOSIS — D2261 Melanocytic nevi of right upper limb, including shoulder: Secondary | ICD-10-CM | POA: Diagnosis not present

## 2023-06-05 DIAGNOSIS — L821 Other seborrheic keratosis: Secondary | ICD-10-CM | POA: Diagnosis not present

## 2023-06-05 DIAGNOSIS — L218 Other seborrheic dermatitis: Secondary | ICD-10-CM | POA: Diagnosis not present

## 2023-06-13 DIAGNOSIS — I10 Essential (primary) hypertension: Secondary | ICD-10-CM | POA: Diagnosis not present

## 2023-06-13 DIAGNOSIS — D509 Iron deficiency anemia, unspecified: Secondary | ICD-10-CM | POA: Diagnosis not present

## 2023-08-05 IMAGING — MG MM DIGITAL DIAGNOSTIC UNILAT*R* W/ TOMO W/ CAD
8 of 15 series · 8 of 39 positions shown · non-contrast
Comparison: Previous exam(s).

CLINICAL DATA: Short-term follow-up for probably benign right
breast calcifications.

EXAM:
DIGITAL DIAGNOSTIC UNILATERAL RIGHT MAMMOGRAM WITH TOMOSYNTHESIS AND
CAD; ULTRASOUND RIGHT BREAST LIMITED
TECHNIQUE: Right digital diagnostic mammography and breast tomosynthesis was
performed. The images were evaluated with computer-aided detection.;
Targeted ultrasound examination of the right breast was performed

[R ML (1 of 2)]
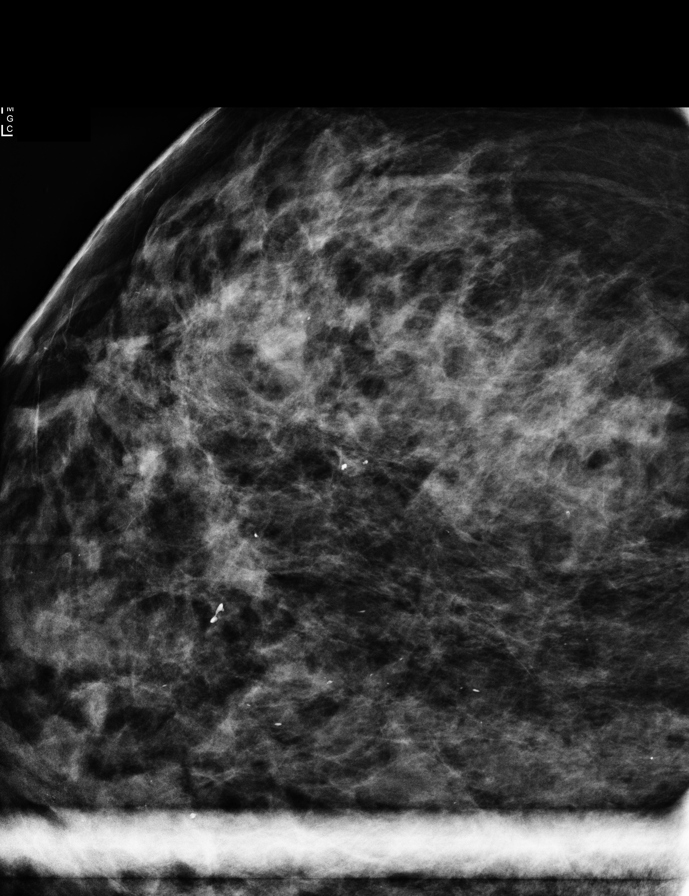

[R ML (2 of 2)]
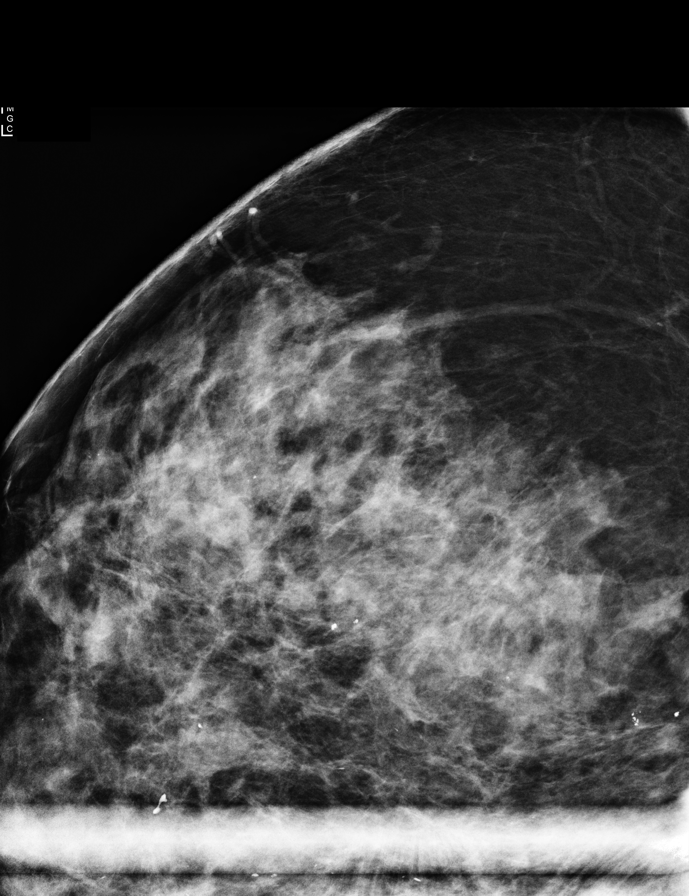

[R CC]
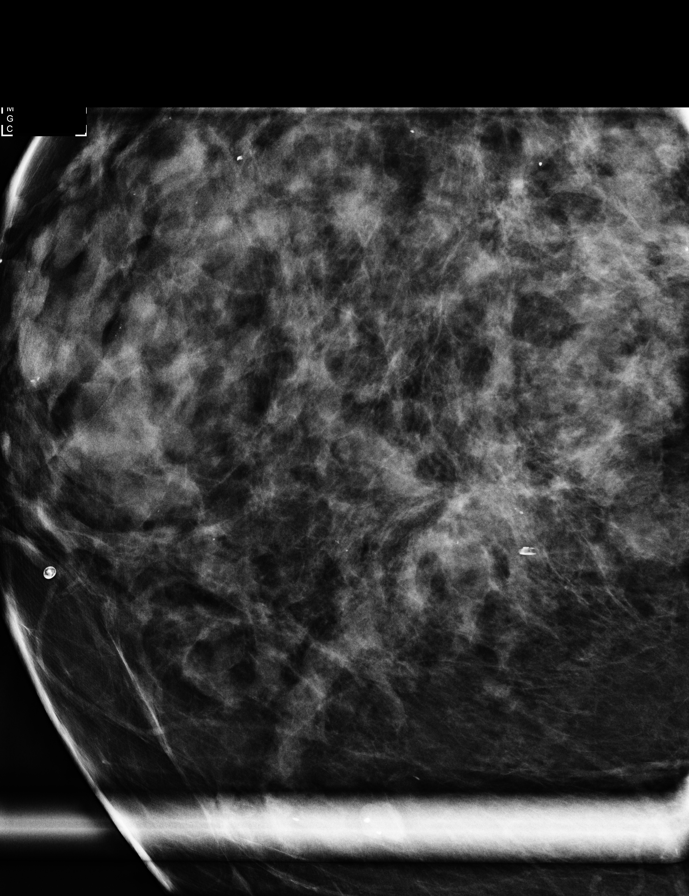

[R MLO synth-2D (1 of 2)]
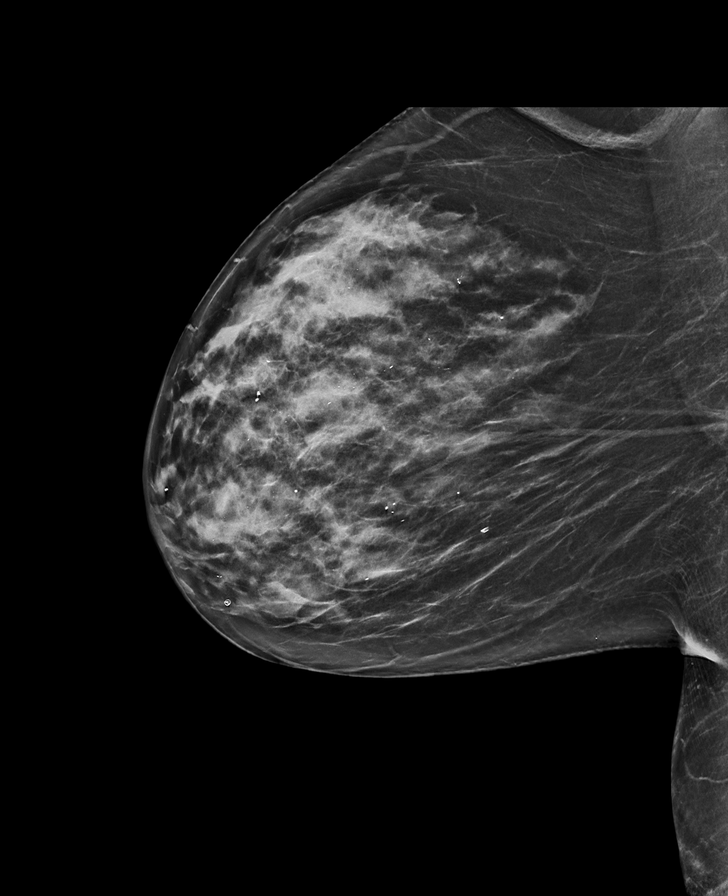

[R CC synth-2D (1 of 3)]
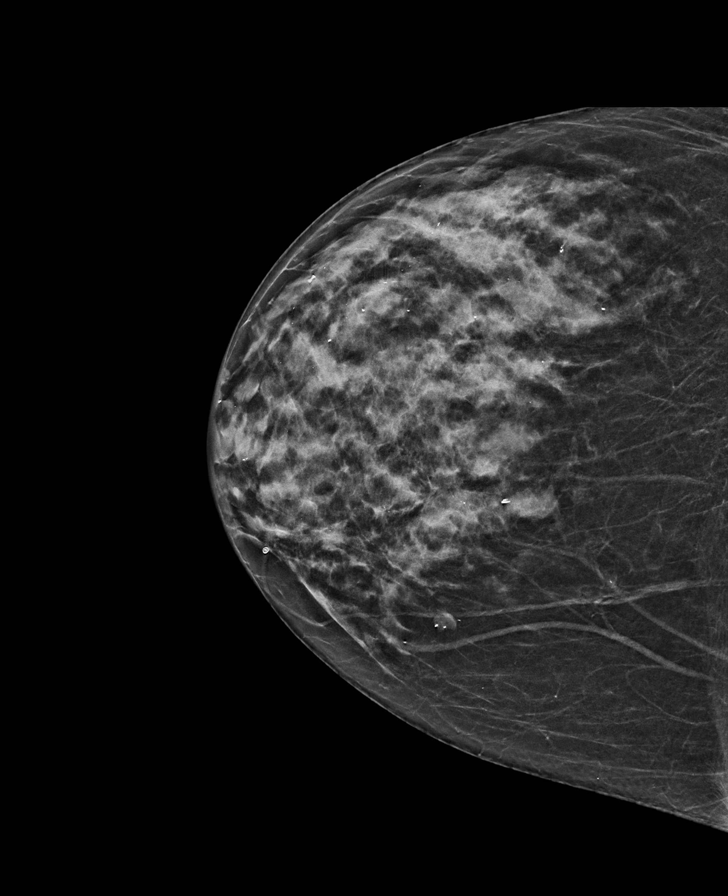

[R CC synth-2D (2 of 3)]
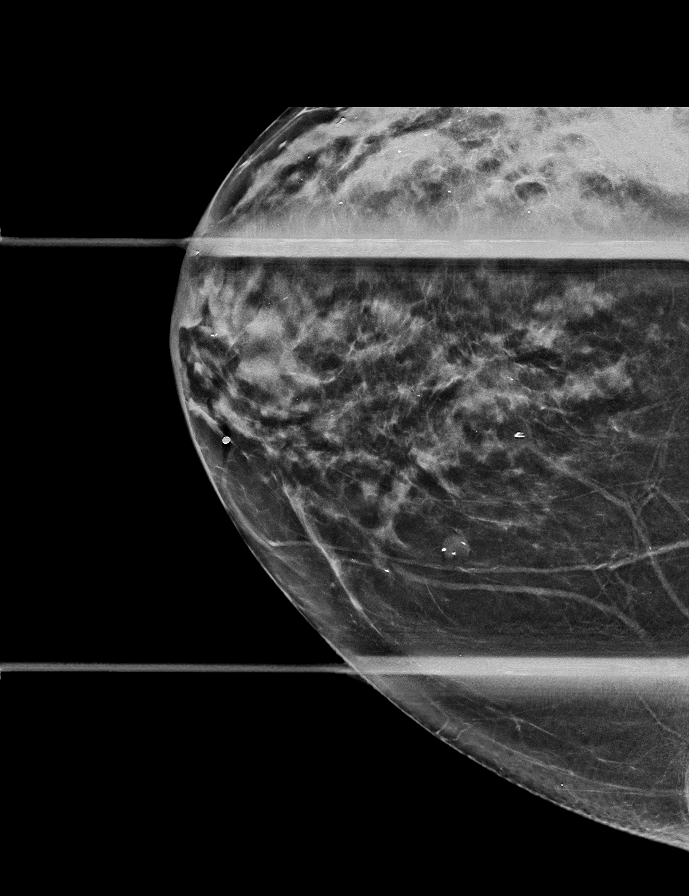

[R CC synth-2D (3 of 3)]
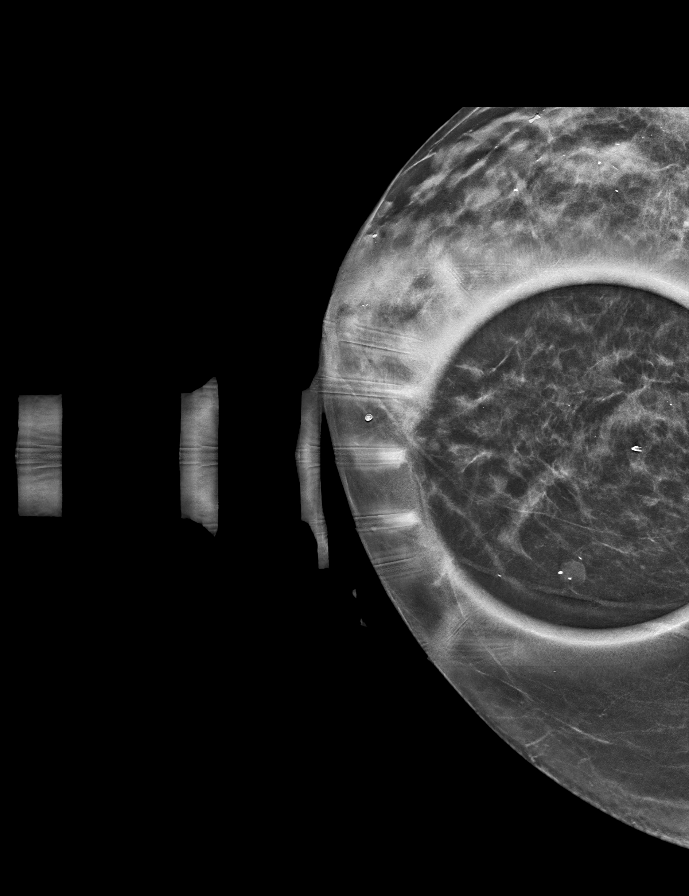

[R MLO synth-2D (2 of 2)]
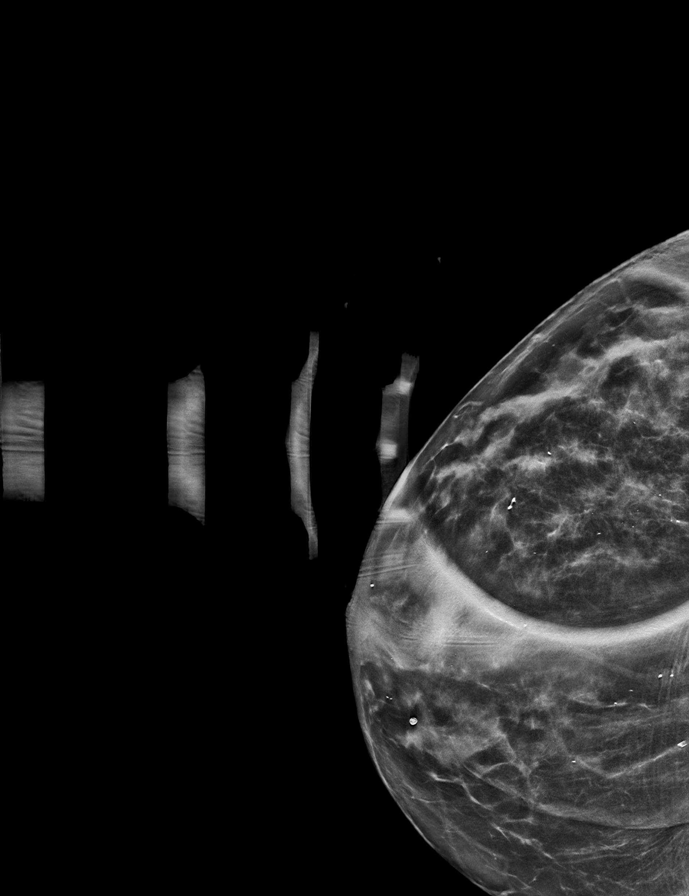

[8 of 39 positions shown; findings below may reference images not displayed]

ACR Breast Density Category c: The breast tissue is heterogeneously
dense, which may obscure small masses.
FINDINGS: In the upper inner right breast, is an area of architectural
distortion with a possible irregular mass.

Small round and punctate calcifications that are scattered
relatively diffusely throughout the fibroglandular tissue are
stable.

Targeted ultrasound is performed, showing an irregular hypoechoic
mass with irregular and ill-defined margins, at 12 o'clock, 4 cm
from the nipple, associated with architectural distortion, measuring
1.6 x 1.2 x 1.3 cm. Sonographic evaluation of the right axilla shows
normal lymph nodes.
IMPRESSION: 1. 1.6 cm mass right breast at 12 o'clock, associated with
architectural distortion, highly suspicious for malignancy. No
evidence of metastatic right axilla lymphadenopathy.
2. Scattered benign right breast calcifications.

RECOMMENDATION:
1. Ultrasound-guided core needle biopsy of the 12 o'clock position
right breast mass. This was scheduled to the patient being
discharged from the [REDACTED].

I have discussed the findings and recommendations with the patient.
If applicable, a reminder letter will be sent to the patient
regarding the next appointment.

BI-RADS CATEGORY  5: Highly suggestive of malignancy.

## 2023-08-27 IMAGING — NM NM BONE WHOLE BODY
2 series · 2 of 2 positions shown · non-contrast
Comparison: None.

CLINICAL DATA: Breast cancer, invasive, stage I/II/III, initial
workup recurrent breast cancer

EXAM:
NUCLEAR MEDICINE WHOLE BODY BONE SCAN
TECHNIQUE: Whole body anterior and posterior images were obtained approximately
3 hours after intravenous injection of radiopharmaceutical.
RADIOPHARMACEUTICALS:  20.7 mCi Iechnetium-BBm MDP IV

[Series 1: whole body · 2.66mm/px · 1 of 1 slices shown (1 of 2)]
[im 1/1]
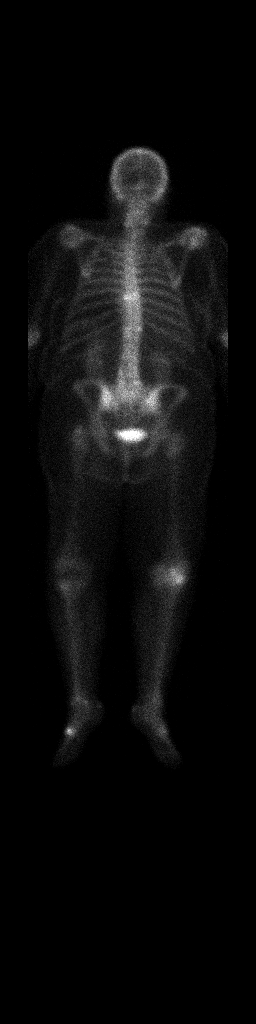

[Series 1: whole body · 2.66mm/px · 1 of 1 slices shown (2 of 2)]
[im 1/1]
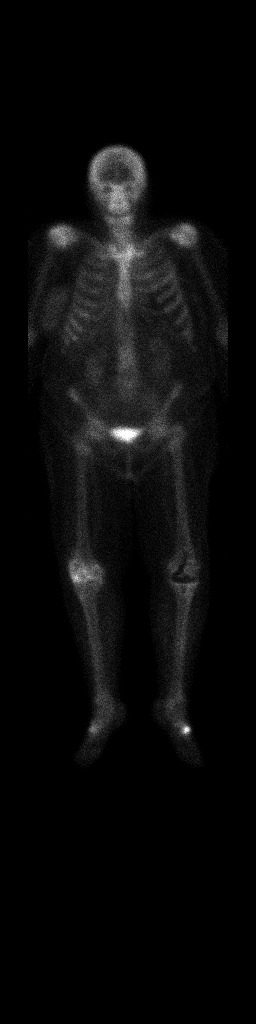

[2 of 2 positions shown; findings below may reference images not displayed]

FINDINGS: Focal uptake of radiotracer at T7-8 and T11 correspond to asymmetric
changes of degenerative disc disease and inferior endplate fracture,
respectively, better visualized on CT examination of 06/06/2021.
Focal uptake within the a lumbar spine, right knee, and feet
bilaterally are degenerative in nature. Left total knee arthroplasty
has been performed. No evidence of osseous metastatic disease.
Prominent soft tissue uptake within the residual right breast.
Normal uptake and excretion within the kidneys and bladder.
IMPRESSION: No evidence of osseous metastatic disease.

## 2023-09-09 IMAGING — CR DG CHEST 1V PORT
1 series · 1 of 1 positions shown · non-contrast
Comparison: 12/29/2011

CLINICAL DATA: Port-A-Cath placement.  Breast carcinoma.

EXAM:
PORTABLE CHEST 1 VIEW

[chest ap]
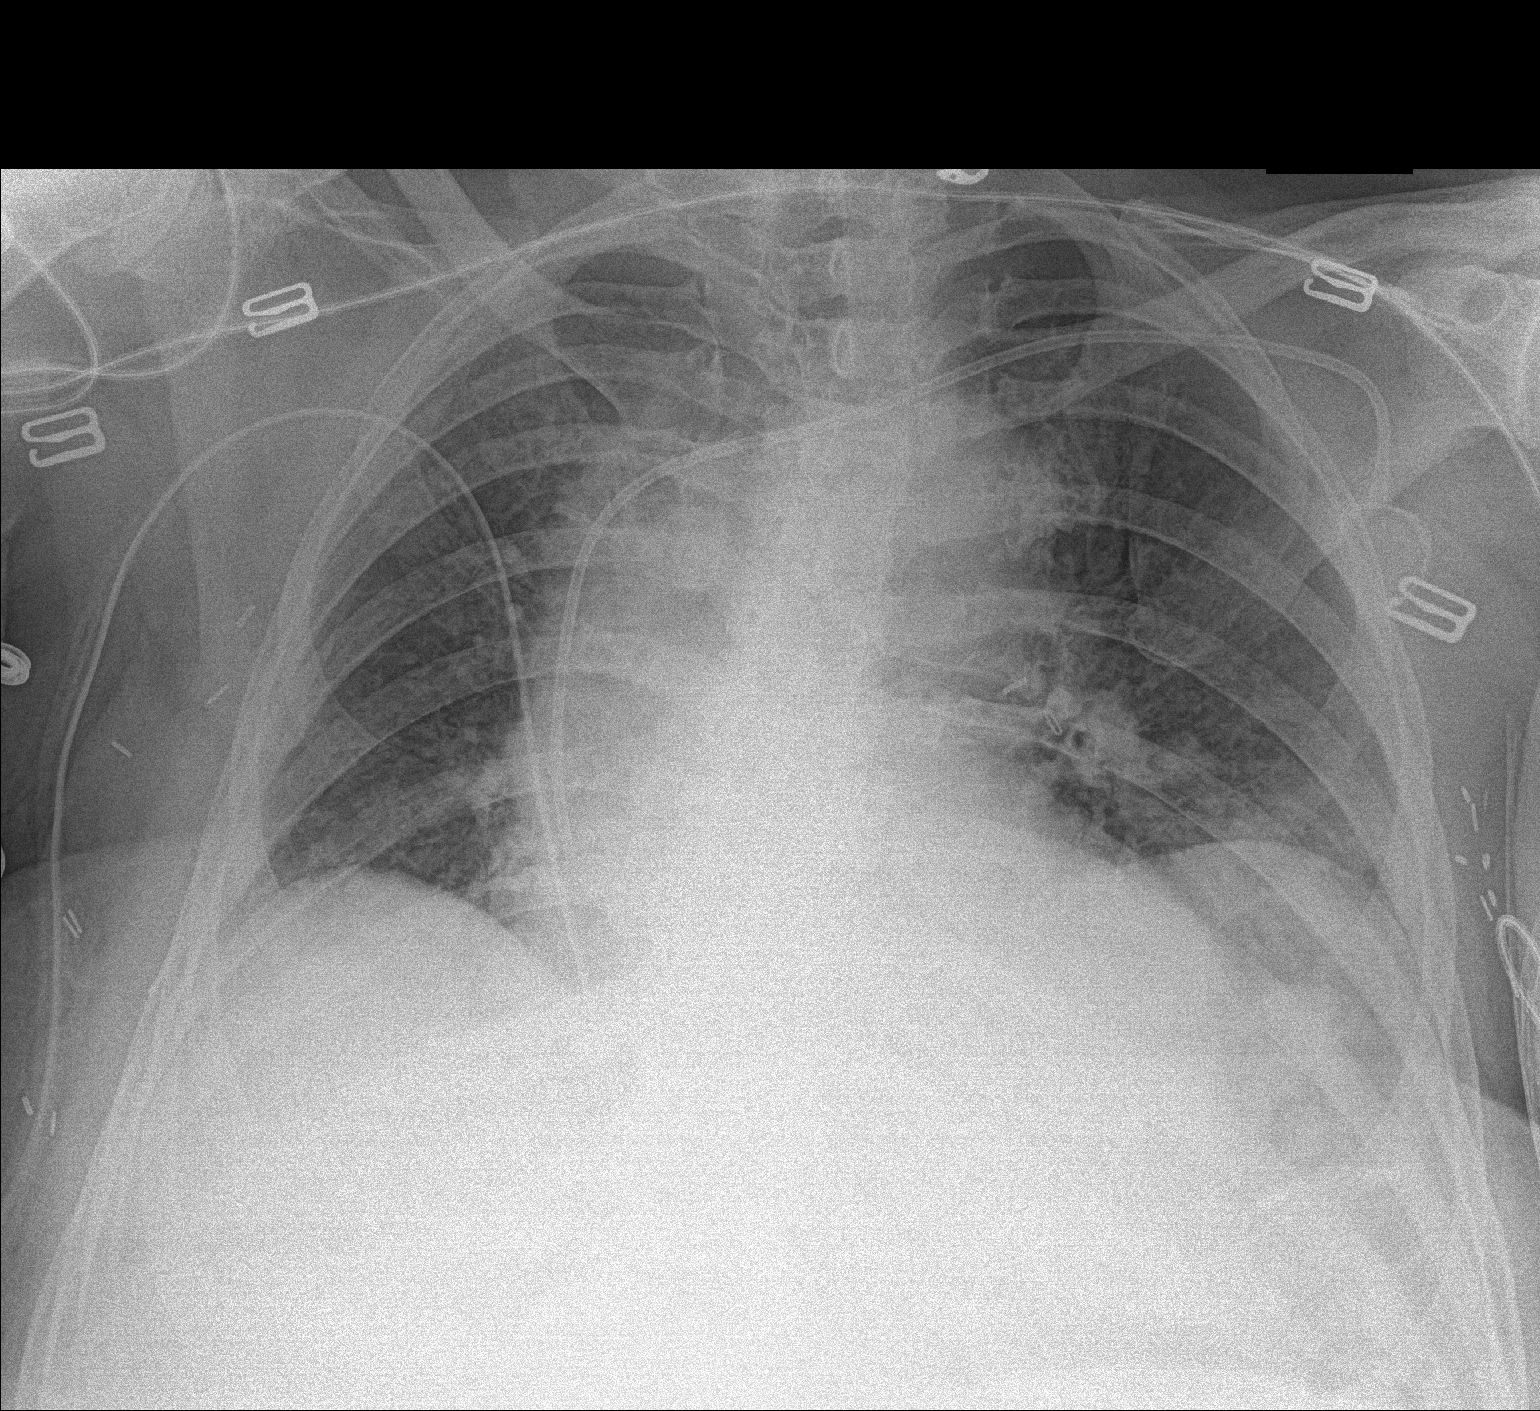

[1 of 1 positions shown; findings below may reference images not displayed]

FINDINGS: Left-sided Port-A-Cath is now seen with tip overlying the region of
the superior cavoatrial junction. Surgical clips are now seen in
both axillary regions, and a catheter overlying the right hemithorax
likely represents a surgical drain.

No evidence of pneumothorax. Low lung volumes are seen with mild
bibasilar atelectasis. No evidence of pneumothorax or pleural
effusion. Heart size is prominent but exaggerated by low lung
volumes and portable technique.
IMPRESSION: Left-sided Port-A-Cath in appropriate position. No pneumothorax
visualized.

Low lung volumes with bibasilar atelectasis.

## 2023-09-25 ENCOUNTER — Inpatient Hospital Stay: Payer: Medicare PPO | Attending: Hematology and Oncology

## 2023-09-25 ENCOUNTER — Ambulatory Visit (HOSPITAL_COMMUNITY)
Admission: RE | Admit: 2023-09-25 | Discharge: 2023-09-25 | Disposition: A | Discharge status: Home / Self Care | Source: Ambulatory Visit | Attending: Hematology and Oncology | Admitting: Hematology and Oncology

## 2023-09-25 DIAGNOSIS — Z9049 Acquired absence of other specified parts of digestive tract: Secondary | ICD-10-CM | POA: Diagnosis not present

## 2023-09-25 DIAGNOSIS — R911 Solitary pulmonary nodule: Secondary | ICD-10-CM

## 2023-09-25 DIAGNOSIS — J45909 Unspecified asthma, uncomplicated: Secondary | ICD-10-CM | POA: Insufficient documentation

## 2023-09-25 DIAGNOSIS — C50512 Malignant neoplasm of lower-outer quadrant of left female breast: Secondary | ICD-10-CM | POA: Insufficient documentation

## 2023-09-25 DIAGNOSIS — C189 Malignant neoplasm of colon, unspecified: Secondary | ICD-10-CM

## 2023-09-25 DIAGNOSIS — Z8049 Family history of malignant neoplasm of other genital organs: Secondary | ICD-10-CM | POA: Diagnosis not present

## 2023-09-25 DIAGNOSIS — I7090 Unspecified atherosclerosis: Secondary | ICD-10-CM | POA: Diagnosis not present

## 2023-09-25 DIAGNOSIS — Z800 Family history of malignant neoplasm of digestive organs: Secondary | ICD-10-CM | POA: Insufficient documentation

## 2023-09-25 DIAGNOSIS — Z85038 Personal history of other malignant neoplasm of large intestine: Secondary | ICD-10-CM | POA: Diagnosis not present

## 2023-09-25 DIAGNOSIS — R918 Other nonspecific abnormal finding of lung field: Secondary | ICD-10-CM | POA: Diagnosis not present

## 2023-09-25 DIAGNOSIS — Z8041 Family history of malignant neoplasm of ovary: Secondary | ICD-10-CM | POA: Insufficient documentation

## 2023-09-25 DIAGNOSIS — Z9011 Acquired absence of right breast and nipple: Secondary | ICD-10-CM | POA: Insufficient documentation

## 2023-09-25 DIAGNOSIS — I719 Aortic aneurysm of unspecified site, without rupture: Secondary | ICD-10-CM | POA: Diagnosis not present

## 2023-09-25 DIAGNOSIS — Z853 Personal history of malignant neoplasm of breast: Secondary | ICD-10-CM | POA: Diagnosis not present

## 2023-09-25 DIAGNOSIS — Z808 Family history of malignant neoplasm of other organs or systems: Secondary | ICD-10-CM | POA: Insufficient documentation

## 2023-09-25 DIAGNOSIS — C50919 Malignant neoplasm of unspecified site of unspecified female breast: Secondary | ICD-10-CM | POA: Diagnosis not present

## 2023-09-25 LAB — CBC WITH DIFFERENTIAL (CANCER CENTER ONLY)
Abs Immature Granulocytes: 0.08 10*3/uL — ABNORMAL HIGH (ref 0.00–0.07)
Basophils Absolute: 0 10*3/uL (ref 0.0–0.1)
Basophils Relative: 1 %
Eosinophils Absolute: 0.3 10*3/uL (ref 0.0–0.5)
Eosinophils Relative: 4 %
HCT: 41.1 % (ref 36.0–46.0)
Hemoglobin: 14 g/dL (ref 12.0–15.0)
Immature Granulocytes: 1 %
Lymphocytes Relative: 8 %
Lymphs Abs: 0.5 10*3/uL — ABNORMAL LOW (ref 0.7–4.0)
MCH: 29.9 pg (ref 26.0–34.0)
MCHC: 34.1 g/dL (ref 30.0–36.0)
MCV: 87.6 fL (ref 80.0–100.0)
Monocytes Absolute: 0.3 10*3/uL (ref 0.1–1.0)
Monocytes Relative: 5 %
Neutro Abs: 5.7 10*3/uL (ref 1.7–7.7)
Neutrophils Relative %: 81 %
Platelet Count: 257 10*3/uL (ref 150–400)
RBC: 4.69 MIL/uL (ref 3.87–5.11)
RDW: 12.7 % (ref 11.5–15.5)
WBC Count: 7 10*3/uL (ref 4.0–10.5)
nRBC: 0 % (ref 0.0–0.2)

## 2023-09-25 LAB — CMP (CANCER CENTER ONLY)
ALT: 13 U/L (ref 0–44)
AST: 15 U/L (ref 15–41)
Albumin: 4.2 g/dL (ref 3.5–5.0)
Alkaline Phosphatase: 65 U/L (ref 38–126)
Anion gap: 8 (ref 5–15)
BUN: 15 mg/dL (ref 8–23)
CO2: 29 mmol/L (ref 22–32)
Calcium: 9.5 mg/dL (ref 8.9–10.3)
Chloride: 97 mmol/L — ABNORMAL LOW (ref 98–111)
Creatinine: 0.76 mg/dL (ref 0.44–1.00)
GFR, Estimated: >60 mL/min (ref >60)
Glucose, Bld: 153 mg/dL — ABNORMAL HIGH (ref 70–99)
Potassium: 3.7 mmol/L (ref 3.5–5.1)
Sodium: 134 mmol/L — ABNORMAL LOW (ref 135–145)
Total Bilirubin: 0.5 mg/dL (ref 0.0–1.2)
Total Protein: 6.9 g/dL (ref 6.5–8.1)

## 2023-09-25 LAB — CEA (ACCESS): CEA (CHCC): 1.66 ng/mL (ref 0.00–5.00)

## 2023-09-25 MED ORDER — SODIUM CHLORIDE (PF) 0.9 % IJ SOLN
INTRAMUSCULAR | Status: AC
  Filled 2023-09-25: qty 50

## 2023-09-25 MED ORDER — IOHEXOL 300 MG/ML  SOLN
75.0000 mL | Freq: Once | INTRAMUSCULAR | Status: AC | PRN
  Administered 2023-09-25: 75 mL via INTRAVENOUS

## 2023-10-02 ENCOUNTER — Encounter: Payer: Self-pay | Admitting: Adult Health

## 2023-10-02 ENCOUNTER — Telehealth: Payer: Self-pay

## 2023-10-02 ENCOUNTER — Inpatient Hospital Stay: Payer: Medicare PPO | Admitting: Adult Health

## 2023-10-02 DIAGNOSIS — Z9011 Acquired absence of right breast and nipple: Secondary | ICD-10-CM | POA: Diagnosis not present

## 2023-10-02 DIAGNOSIS — C50512 Malignant neoplasm of lower-outer quadrant of left female breast: Secondary | ICD-10-CM

## 2023-10-02 DIAGNOSIS — Z9049 Acquired absence of other specified parts of digestive tract: Secondary | ICD-10-CM | POA: Diagnosis not present

## 2023-10-02 DIAGNOSIS — C189 Malignant neoplasm of colon, unspecified: Secondary | ICD-10-CM | POA: Diagnosis not present

## 2023-10-02 DIAGNOSIS — R918 Other nonspecific abnormal finding of lung field: Secondary | ICD-10-CM | POA: Diagnosis not present

## 2023-10-02 DIAGNOSIS — R911 Solitary pulmonary nodule: Secondary | ICD-10-CM

## 2023-10-02 DIAGNOSIS — J45909 Unspecified asthma, uncomplicated: Secondary | ICD-10-CM | POA: Diagnosis not present

## 2023-10-02 DIAGNOSIS — I7090 Unspecified atherosclerosis: Secondary | ICD-10-CM | POA: Diagnosis not present

## 2023-10-02 DIAGNOSIS — Z85038 Personal history of other malignant neoplasm of large intestine: Secondary | ICD-10-CM | POA: Diagnosis not present

## 2023-10-02 DIAGNOSIS — Z8041 Family history of malignant neoplasm of ovary: Secondary | ICD-10-CM | POA: Diagnosis not present

## 2023-10-02 DIAGNOSIS — I719 Aortic aneurysm of unspecified site, without rupture: Secondary | ICD-10-CM | POA: Diagnosis not present

## 2023-10-02 DIAGNOSIS — C50111 Malignant neoplasm of central portion of right female breast: Secondary | ICD-10-CM | POA: Diagnosis not present

## 2023-10-02 DIAGNOSIS — Z853 Personal history of malignant neoplasm of breast: Secondary | ICD-10-CM | POA: Diagnosis not present

## 2023-10-02 NOTE — Progress Notes (Signed)
 [facility] Cancer Follow up:    Ann Broad, MD [address] [place] [address]   DIAGNOSIS:  Cancer Staging  Cancer of central portion of right breast [facility]) Staging form: Breast, AJCC 8th Edition - Pathologic stage from 11/08/2021: Stage IIIA (pT2, pN1, cM0, G3, ER-, PR-, HER2-) - Signed by Colie Dawes, MD on 11/08/2021 Stage prefix: Initial diagnosis Histologic grading system: 3 grade system  Malignant neoplasm of central portion of right breast in female, estrogen receptor negative (HCC) Staging form: Breast, AJCC 8th Edition - Clinical: Stage IB (cT1c, cN0, cM0, G3, ER-, PR-, HER2-) - Signed by Cameron Cea, MD on 06/01/2021 Histologic grading system: 3 grade system - Pathologic stage from 06/23/2021: Stage IIIA (pT2, pN1a, cM0, G3, ER-, PR-, HER2-) - Signed by Percival Brace, NP on 11/15/2021 Stage prefix: Initial diagnosis Histologic grading system: 3 grade system  Primary cancer of lower outer quadrant of left female breast (HCC) Staging form: Breast, AJCC 7th Edition - Clinical stage from 12/20/2011: Stage 0 (Tis, N0, cM0) - Unsigned Staged by: Pathologist and managing physician Specimen type: Core Needle Biopsy Histopathologic type: 9932 Laterality: Left - Pathologic: No stage assigned - Unsigned Specimen type: Core Needle Biopsy Histopathologic type: 9932 Laterality: Left  Primary colon cancer (HCC) Staging form: Colon and Rectum, AJCC 7th Edition - Pathologic: Stage IIA (T3, N0, cM0) - Signed by Chaneta Comer, MD on 06/15/2014 Laterality: Right Tumor size (mm): 94 Histologic grade (G): G2 Lymph-vascular invasion (LVI): LVI not present (absent)/not identified Residual tumor (R): R0 Tumor deposits (TD): Absent Perineural invasion (PNI): Absent KRAS gene analysis: Not assessed Stage used in treatment planning: Yes National guidelines used in treatment planning: Yes Type of national guideline used in treatment planning:  NCCN    SUMMARY OF ONCOLOGIC HISTORY: Oncology History  Primary cancer of lower outer quadrant of left female breast (HCC)  01/04/2012 Surgery   Left mastectomy: Invasive ductal carcinoma 0.5 cm with high-grade DCIS 9 cm, margins negative, 0/6 lymph nodes, ER 100%, PR 100%, Ki-67 17%, HER-2 negative ratio 1.16   02/07/2012 - 05/28/2016 Anti-estrogen oral therapy   Anastrozole  1 mg daily (stopped early due to Osteoporosis)   06/01/2021 Initial Diagnosis   Left breast cancer invasive ductal carcinoma: 0.5 cm with 9 mm area of DCIS status post left mastectomy 01/04/2012 ER/PR positive HER-2 negative Ki-67 17% currently on Arimidex  1 mg daily  Since 02/07/2012.  Completed 7 years of October 2020  (cecal adenocarcinoma diagnosed 04/22/2013, preop CEA of 5.1, status post right hemicolectomy 04/28/2014, grade 2, no MVI, 0/25 lymph nodes, subserosal involvement, T3 N0 M0 stage II A, no mismatch repair)   06/23/2021 Surgery   Right mastectomy: Grade 3 IDC with DCIS, 3.4 cm, margins negative, 2/3 lymph nodes positive ER 0%, PR 0%, HER2 negative, Ki-67 20%   Primary colon cancer (HCC)  03/20/2014 Initial Diagnosis   Invasive adenocarcinoma of [place]   04/28/2014 Surgery   Right hemicolectomy: Invasive adenocarcinoma with mucinous features moderately differentiated invading subserosal tissue, margins negative, 25 lymph nodes negative, T3 N0 M0 stage II a, no microsatellite instability   Malignant neoplasm of central portion of right breast in female, estrogen receptor negative (HCC)  06/01/2021 Initial Diagnosis   Left breast cancer invasive ductal carcinoma: 0.5 cm with 9 mm area of DCIS status post left mastectomy 01/04/2012 ER/PR positive HER-2 negative Ki-67 17% currently on Arimidex  1 mg daily  Since 02/07/2012.  Completed 7 years of October 2020  (cecal adenocarcinoma diagnosed 04/22/2013,  preop CEA of 5.1, status post right hemicolectomy 04/28/2014, grade 2, no MVI, 0/25 lymph nodes, subserosal  involvement, T3 N0 M0 stage II A, no mismatch repair)   06/01/2021 Cancer Staging   Staging form: Breast, AJCC 8th Edition - Clinical: Stage IB (cT1c, cN0, cM0, G3, ER-, PR-, HER2-) - Signed by Cameron Cea, MD on 06/01/2021 Histologic grading system: 3 grade system   06/08/2021 Imaging   CT CAP and bone scan: 06/08/2021: Multiple small lung nodules stable with the exception of new 4 mm left lower lobe lung nodule and a 6 mm right lower lobe lung nodule open (nonspecific)   06/23/2021 Surgery   06/23/2021:Right mastectomy: Grade 3 IDC with DCIS, 3.4 cm, margins negative, 2/3 lymph nodes positive ER 0%, PR 0%, HER2 negative, Ki-67 20%   06/23/2021 Cancer Staging   Staging form: Breast, AJCC 8th Edition - Pathologic stage from 06/23/2021: Stage IIIA (pT2, pN1a, cM0, G3, ER-, PR-, HER2-) - Signed by Percival Brace, NP on 11/15/2021 Stage prefix: Initial diagnosis Histologic grading system: 3 grade system   08/02/2021 - 11/15/2021 Adjuvant Chemotherapy   CMF every 21 days x 6 cycles   12/08/2021 - 01/12/2022 Radiation Therapy   Site Technique Total Dose (Gy) Dose per Fx (Gy) Completed Fx Beam Energies  Chest Wall, Right: CW_R_IMN 3D 50/50 2 25/25 10X  Chest Wall, Right: CW_R_PAB_SCV 3D 50/50 2 25/25 6X, 10X     Cancer of central portion of right breast (HCC)  06/23/2021 Initial Diagnosis   Cancer of central portion of right breast (HCC)   11/08/2021 Cancer Staging   Staging form: Breast, AJCC 8th Edition - Pathologic stage from 11/08/2021: Stage IIIA (pT2, pN1, cM0, G3, ER-, PR-, HER2-) - Signed by Colie Dawes, MD on 11/08/2021 Stage prefix: Initial diagnosis Histologic grading system: 3 grade system   12/08/2021 - 01/12/2022 Radiation Therapy   Site Technique Total Dose (Gy) Dose per Fx (Gy) Completed Fx Beam Energies  Chest Wall, Right: CW_R_IMN 3D 50/50 2 25/25 10X  Chest Wall, Right: CW_R_PAB_SCV 3D 50/50 2 25/25 6X, 10X       CURRENT THERAPY: observation  INTERVAL  HISTORY:  Discussed the use of AI scribe software for clinical note transcription with the patient, who gave verbal consent to proceed.  History of Present Illness Ann Maxwell is an 83 year old female with a history of breast cancer who presents for follow-up.  A CT scan on September 25, 2023, shows scattered small bilateral pulmonary nodules, with the largest measuring 7 mm in the right lower lobe. Follow-up imaging is suggested. Lab tests on the same day are essentially normal, including a negative CEA test, which is pertinent given her history of colon cancer.  She is concerned about the size of her aortic aneurysm, which has shown slight enlargement on previous scans. She experiences frequent coughing, attributed to asthma, and is worried about its impact on her aneurysm. Her cholesterol is regularly borderline, and she opts not to take medication due to potential side effects. Recent labs indicate good kidney and liver function, with slightly elevated blood sugar and minor electrolyte imbalances.  Her asthma leads to sneezing and coughing, particularly in certain environments. She is considering taking an antihistamine in the evening to reduce morning secretions. She underwent knee surgery since her last visit, which was successful.    Patient Active Problem List   Diagnosis Date Noted   Osteoarthritis of right knee 10/09/2022   Genetic testing 07/26/2021   Family history of ovarian cancer  07/15/2021   Cancer of central portion of right breast (HCC) 06/23/2021   Malignant neoplasm of central portion of right breast in female, estrogen receptor negative (HCC) 06/01/2021   Pain in right knee 06/12/2019   Asthma 10/17/2017   OA (osteoarthritis) of knee 08/23/2015   Primary colon cancer (HCC) 03/31/2014   Iron deficiency anemia due to chronic blood loss 07/01/2013   Sinus tachycardia 09/12/2012   Mobitz type 1 second degree AV block 09/12/2012   Chest pain 09/11/2012   HTN  (hypertension) 09/11/2012   Asthma, chronic 09/11/2012   Primary cancer of lower outer quadrant of left female breast (HCC) 12/12/2011    is allergic to other, pollen extract, tape, silvadene [silver sulfadiazine], tetracycline, tyloxapol, and terramycin [oxytetracycline].  MEDICAL HISTORY: Past Medical History:  Diagnosis Date   Allergy    Anemia    iron infusion -last done 1 month ago(12-15, and 12-22 -15 [facility])- Dr. Lee Public   Anxiety    Asthma    Breast CA [facility]) 2013   a. L breast DCIS, s/p L mastectomy 12/2011. no further issues   Breast cancer (HCC) 2023   Right breast IDC   Cataract    bilat removed    Colon cancer (HCC)    Depression    pt denies any hx of depression    Environmental allergies    History of kidney stones    Hypertension    controlled on meds    Internal hemorrhoids    remains an issue- no bright bleeding seen recent   Iron deficiency anemia    Obesity    Osteoarthritis    arthritis- knees and shoulders   Osteoporosis    drug induced and on Fosamax    Port-A-Cath in place 08/01/2021   Transfusion history    2 yrs ago due to anemia   Vertigo    no recent issues    SURGICAL HISTORY: Past Surgical History:  Procedure Laterality Date   BREAST BIOPSY Right 05/27/2021   U/S Bx   BREAST SURGERY     CATARACT EXTRACTION, BILATERAL  2019   CHOLECYSTECTOMY     COLON RESECTION N/A 04/28/2014   Procedure: LAPAROSCOPIC  RIGHT HEMI-COLECTOMY;  Surgeon: Lockie Rima, MD;  Location: [facility];  Service: General;  Laterality: N/A;   COLONOSCOPY     Lithectomy  2010   LITHOTRIPSY     MASTECTOMY Left 2013   MASTECTOMY W/ SENTINEL NODE BIOPSY  01/04/2012   Procedure: MASTECTOMY WITH SENTINEL LYMPH NODE BIOPSY;  Surgeon: Lockie Rima, MD;  Location: [facility];  Service: General;  Laterality: Left;   ORIF TIBIA & FIBULA FRACTURES     wtih subsequent pin removal in 1997   ORIF ULNAR / RADIAL SHAFT FRACTURE Left    POLYPECTOMY     PORT-A-CATH REMOVAL Left 12/01/2021    Procedure: REMOVAL PORT-A-CATH;  Surgeon: Lockie Rima, MD;  Location: [facility];  Service: General;  Laterality: Left;   PORTACATH PLACEMENT Left 06/23/2021   Procedure: INSERTION PORT-A-CATH;  Surgeon: Lockie Rima, MD;  Location: [facility];  Service: General;  Laterality: Left;   SENTINEL NODE BIOPSY Right 06/23/2021   Procedure: SENTINEL LYMPH NODE BIOPSY;  Surgeon: Lockie Rima, MD;  Location: [facility];  Service: General;  Laterality: Right;   SIMPLE MASTECTOMY WITH AXILLARY SENTINEL NODE BIOPSY Right 06/23/2021   Procedure: RIGHT MASTECTOMY;  Surgeon: Lockie Rima, MD;  Location:  SURGERY CENTER;  Service: General;  Laterality: Right;   TONSILLECTOMY AND ADENOIDECTOMY  TOTAL KNEE ARTHROPLASTY Left 08/23/2015   Procedure: TOTAL KNEE ARTHROPLASTY;  Surgeon: Liliane Rei, MD;  Location: [facility];  Service: Orthopedics;  Laterality: Left;   TOTAL KNEE ARTHROPLASTY Right 10/09/2022   Procedure: TOTAL KNEE ARTHROPLASTY;  Surgeon: Liliane Rei, MD;  Location: [facility];  Service: Orthopedics;  Laterality: Right;   VAGINAL HYSTERECTOMY  20 yrs. ago   with BSO    SOCIAL HISTORY: Social History   Socioeconomic History   Marital status: Divorced    Spouse name: Not on file   Number of children: Not on file   Years of education: Not on file   Highest education level: Not on file  Occupational History   Occupation: retired Engineer, civil (consulting)  Tobacco Use   Smoking status: Never    Passive exposure: Past   Smokeless tobacco: Never  Vaping Use   Vaping status: Never Used  Substance and Sexual Activity   Alcohol  use: Not Currently    Comment: occasionally - few times a year    Drug use: Never   Sexual activity: Not Currently    Birth control/protection: Surgical  Other Topics Concern   Not on file  Social History Narrative   Retired Risk analyst, 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 who is a Engineer, civil (consulting)   Social Drivers of Corporate investment banker Strain: Not on file   Food Insecurity: No Food Insecurity (10/09/2022)   Hunger Vital Sign    Worried About Running Out of Food in the Last Year: Never true    Ran Out of Food in the Last Year: Never true  Transportation Needs: No Transportation Needs (10/09/2022)   PRAPARE - Administrator, Civil Service (Medical): No    Lack of Transportation (Non-Medical): No  Physical Activity: Not on file  Stress: Not on file  Social Connections: Not on file  Intimate Partner Violence: Not At Risk (10/09/2022)   Humiliation, Afraid, Rape, and Kick questionnaire    Fear of Current or Ex-Partner: No    Emotionally Abused: No    Physically Abused: No    Sexually Abused: No    FAMILY HISTORY: Family History  Problem Relation Age of Onset   Congestive Heart Failure Father        Died at 51   Hypertension Father    Heart disease Father    Stroke Father    Ovarian cancer Mother 37       deceased 74   Uterine cancer Maternal Grandmother        late 11s   Liver cancer Paternal Grandfather 4       deceased 1s   Thyroid  cancer Sister 55       currently 35   Colon cancer Neg Hx    Stomach cancer Neg Hx    Esophageal cancer Neg Hx    Rectal cancer Neg Hx    Breast cancer Neg Hx    Colon polyps Neg Hx     Review of Systems  Constitutional:  Negative for appetite change, chills, fatigue, fever and unexpected weight change.  HENT:   Negative for hearing loss, lump/mass and trouble swallowing.   Eyes:  Negative for eye problems and icterus.  Respiratory:  Negative for chest tightness, cough and shortness of breath.   Cardiovascular:  Negative for chest pain, leg swelling and palpitations.  Gastrointestinal:  Negative for abdominal distention, abdominal pain, constipation, diarrhea, nausea and vomiting.  Endocrine: Negative for hot flashes.  Genitourinary:  Negative for difficulty urinating.   Musculoskeletal:  Negative  for arthralgias.  Skin:  Negative for itching and rash.  Neurological:  Negative  for dizziness, extremity weakness, headaches and numbness.  Hematological:  Negative for adenopathy. Does not bruise/bleed easily.  Psychiatric/Behavioral:  Negative for depression. The patient is not nervous/anxious.       PHYSICAL EXAMINATION    There were no vitals filed for this visit.  Physical Exam Constitutional:      General: She is not in acute distress.    Appearance: Normal appearance. She is not toxic-appearing.  HENT:     Head: Normocephalic and atraumatic.     Mouth/Throat:     Mouth: Mucous membranes are moist.     Pharynx: Oropharynx is clear. No oropharyngeal exudate or posterior oropharyngeal erythema.   Eyes:     General: No scleral icterus.   Cardiovascular:     Rate and Rhythm: Normal rate and regular rhythm.     Pulses: Normal pulses.     Heart sounds: Normal heart sounds.  Pulmonary:     Effort: Pulmonary effort is normal.     Breath sounds: Normal breath sounds.  Chest:     Comments: S/p bilateral mastectomies, no sign of local recurrence.   Abdominal:     General: Abdomen is flat. Bowel sounds are normal. There is no distension.     Palpations: Abdomen is soft.     Tenderness: There is no abdominal tenderness.   Musculoskeletal:        General: No swelling.     Cervical back: Neck supple.  Lymphadenopathy:     Cervical: No cervical adenopathy.     Upper Body:     Right upper body: No supraclavicular or axillary adenopathy.     Left upper body: No supraclavicular or axillary adenopathy.   Skin:    General: Skin is warm and dry.     Findings: No rash.   Neurological:     General: No focal deficit present.     Mental Status: She is alert.   Psychiatric:        Mood and Affect: Mood normal.        Behavior: Behavior normal.     LABORATORY DATA:  CBC    Component Value Date/Time   WBC 7.0 09/25/2023 1034   WBC 13.8 (H) 10/10/2022 0616   RBC 4.69 09/25/2023 1034   HGB 14.0 09/25/2023 1034   HGB 14.9 05/05/2016 1552   HCT 41.1  09/25/2023 1034   HCT 44.6 05/05/2016 1552   PLT 257 09/25/2023 1034   PLT 260 05/05/2016 1552   MCV 87.6 09/25/2023 1034   MCV 88.3 05/05/2016 1552   MCH 29.9 09/25/2023 1034   MCHC 34.1 09/25/2023 1034   RDW 12.7 09/25/2023 1034   RDW 13.6 05/05/2016 1552   LYMPHSABS 0.5 (L) 09/25/2023 1034   LYMPHSABS 1.4 05/05/2016 1552   MONOABS 0.3 09/25/2023 1034   MONOABS 0.7 05/05/2016 1552   EOSABS 0.3 09/25/2023 1034   EOSABS 0.4 05/05/2016 1552   BASOSABS 0.0 09/25/2023 1034   BASOSABS 0.0 05/05/2016 1552    CMP     Component Value Date/Time   NA 134 (L) 09/25/2023 1034   NA 140 05/05/2016 1552   K 3.7 09/25/2023 1034   K 3.5 05/05/2016 1552   CL 97 (L) 09/25/2023 1034   CL 102 09/23/2012 1028   CO2 29 09/25/2023 1034   CO2 25 05/05/2016 1552   GLUCOSE 153 (H) 09/25/2023 1034   GLUCOSE 115 05/05/2016 1552   GLUCOSE 103 (  H) 09/23/2012 1028   BUN 15 09/25/2023 1034   BUN 17.5 05/05/2016 1552   CREATININE 0.76 09/25/2023 1034   CREATININE 0.8 05/05/2016 1552   CALCIUM 9.5 09/25/2023 1034   CALCIUM 10.0 05/05/2016 1552   PROT 6.9 09/25/2023 1034   PROT 7.8 05/05/2016 1552   ALBUMIN 4.2 09/25/2023 1034   ALBUMIN 4.0 05/05/2016 1552   AST 15 09/25/2023 1034   AST 20 05/05/2016 1552   ALT 13 09/25/2023 1034   ALT 23 05/05/2016 1552   ALKPHOS 65 09/25/2023 1034   ALKPHOS 113 05/05/2016 1552   BILITOT 0.5 09/25/2023 1034   BILITOT 0.32 05/05/2016 1552   GFRNONAA >60 09/25/2023 1034   GFRAA >60 06/11/2019 1039     ASSESSMENT and THERAPY PLAN:   Primary cancer of lower outer quadrant of left female breast (HCC) Assessment and Plan Assessment & Plan Breast cancer No sign of recurrence.  CT scan shows scattered small bilateral pulmonary nodules, possibly mildly progressive. Lab tests normal, including CEA. Discussed Guardant Reveal blood test for circulating tumor DNA. She agreed to proceed. - Order Guardant Reveal blood test to assess for circulating tumor DNA. -  Schedule phone visit in six months to review CT scan results. - Schedule follow-up appointment in one year with labs a week prior.  Pulmonary nodules Scattered small bilateral pulmonary nodules up to 7 mm, possibly mildly progressive. Most nodules typically benign, some increased by 1 mm. Decision to monitor with follow-up CT scan. - Order repeat CT scan of the chest in six months.  Aortic aneurysm Known aortic aneurysm, not mentioned in recent CT scan. Previous scans showed slight enlargement. Plan to remeasure aneurysm. - Request remeasurement of the aortic aneurysm from the recent CT scan.  Asthma Asthma causing coughing and sneezing, especially in certain environments. Discussed use of Zyrtec at night to reduce morning secretions. - Recommend taking Zyrtec at night to reduce morning secretions.  Atherosclerosis CT scan incidentally showed atherosclerosis. Cholesterol levels borderline. She prefers not to take cholesterol medication due to potential side effects.  RTC in 6 months after CT chest RTC in 1 year for f/u with dr. Lee Public   All questions were answered. The patient knows to call the clinic with any problems, questions or concerns. We can certainly see the patient much sooner if necessary.  Total encounter time:30 minutes*in face-to-face visit time, chart review, lab review, care coordination, order entry, and documentation of the encounter time.    Alwin Baars, NP 10/02/23 1:16 PM Medical Oncology and Hematology [facility] [address] [place], [place] [address] Tel. [phone]    Fax. [phone]  *Total Encounter Time as defined by the Centers for Medicare and Medicaid Services includes, in addition to the face-to-face time of a patient visit (documented in the note above) non-face-to-face time: obtaining and reviewing outside history, ordering and reviewing medications, tests or procedures, care coordination (communications with other health  care professionals or caregivers) and documentation in the medical record.

## 2023-10-02 NOTE — Assessment & Plan Note (Addendum)
 Assessment and Plan Assessment & Plan Breast cancer No sign of recurrence.  CT scan shows scattered small bilateral pulmonary nodules, possibly mildly progressive. Lab tests normal, including CEA. Discussed Guardant Reveal blood test for circulating tumor DNA. She agreed to proceed. - Order Guardant Reveal blood test to assess for circulating tumor DNA. - Schedule phone visit in six months to review CT scan results. - Schedule follow-up appointment in one year with labs a week prior.  Pulmonary nodules Scattered small bilateral pulmonary nodules up to 7 mm, possibly mildly progressive. Most nodules typically benign, some increased by 1 mm. Decision to monitor with follow-up CT scan. - Order repeat CT scan of the chest in six months.  Aortic aneurysm Known aortic aneurysm, not mentioned in recent CT scan. Previous scans showed slight enlargement. Plan to remeasure aneurysm. - Request remeasurement of the aortic aneurysm from the recent CT scan.  Asthma Asthma causing coughing and sneezing, especially in certain environments. Discussed use of Zyrtec at night to reduce morning secretions. - Recommend taking Zyrtec at night to reduce morning secretions.  Atherosclerosis CT scan incidentally showed atherosclerosis. Cholesterol levels borderline. She prefers not to take cholesterol medication due to potential side effects.  RTC in 6 months after CT chest RTC in 1 year for f/u with dr. Gudena

## 2023-10-02 NOTE — Telephone Encounter (Signed)
 Per md orders entered for Guardant Reveal and all supported documents faxed to [phone]. Faxed confirmation was received.

## 2023-10-22 DIAGNOSIS — C50512 Malignant neoplasm of lower-outer quadrant of left female breast: Secondary | ICD-10-CM | POA: Diagnosis not present

## 2023-10-26 ENCOUNTER — Encounter: Payer: Self-pay | Admitting: Adult Health

## 2023-12-03 DIAGNOSIS — R300 Dysuria: Secondary | ICD-10-CM | POA: Diagnosis not present

## 2023-12-03 DIAGNOSIS — N3020 Other chronic cystitis without hematuria: Secondary | ICD-10-CM | POA: Diagnosis not present

## 2023-12-03 DIAGNOSIS — N952 Postmenopausal atrophic vaginitis: Secondary | ICD-10-CM | POA: Diagnosis not present

## 2024-01-15 DIAGNOSIS — H52203 Unspecified astigmatism, bilateral: Secondary | ICD-10-CM | POA: Diagnosis not present

## 2024-01-15 DIAGNOSIS — Z961 Presence of intraocular lens: Secondary | ICD-10-CM | POA: Diagnosis not present

## 2024-01-24 NOTE — Telephone Encounter (Signed)
 error

## 2024-03-04 ENCOUNTER — Other Ambulatory Visit: Payer: Self-pay | Admitting: Adult Health

## 2024-03-04 DIAGNOSIS — C50111 Malignant neoplasm of central portion of right female breast: Secondary | ICD-10-CM

## 2024-03-04 DIAGNOSIS — C50512 Malignant neoplasm of lower-outer quadrant of left female breast: Secondary | ICD-10-CM

## 2024-03-04 DIAGNOSIS — N390 Urinary tract infection, site not specified: Secondary | ICD-10-CM | POA: Diagnosis not present

## 2024-03-04 DIAGNOSIS — R319 Hematuria, unspecified: Secondary | ICD-10-CM | POA: Diagnosis not present

## 2024-03-27 NOTE — Progress Notes (Signed)
 Reason for Note: Patient communication regarding genetic testing.  Patient Request: Patient would like to discuss further genetic testing directly with the provider before proceeding.  Additional Instruction: Patient requests that the phlebotomy outreach team not contact her until after this conversation has taken place.  Plan: Await provider discussion; hold phlebotomy outreach attempts until patient provides further direction.

## 2024-04-02 ENCOUNTER — Other Ambulatory Visit: Payer: Self-pay | Admitting: Adult Health

## 2024-04-02 ENCOUNTER — Ambulatory Visit (HOSPITAL_COMMUNITY): Admission: RE | Admit: 2024-04-02 | Discharge: 2024-04-02 | Attending: Adult Health | Admitting: Adult Health

## 2024-04-02 DIAGNOSIS — Z171 Estrogen receptor negative status [ER-]: Secondary | ICD-10-CM

## 2024-04-02 DIAGNOSIS — C50512 Malignant neoplasm of lower-outer quadrant of left female breast: Secondary | ICD-10-CM | POA: Insufficient documentation

## 2024-04-02 DIAGNOSIS — C50111 Malignant neoplasm of central portion of right female breast: Secondary | ICD-10-CM | POA: Insufficient documentation

## 2024-04-02 DIAGNOSIS — R911 Solitary pulmonary nodule: Secondary | ICD-10-CM | POA: Insufficient documentation

## 2024-04-02 MED ORDER — IOHEXOL 300 MG/ML  SOLN
100.0000 mL | Freq: Once | INTRAMUSCULAR | Status: AC | PRN
  Administered 2024-04-02: 12:00:00 100 mL via INTRAVENOUS

## 2024-04-08 ENCOUNTER — Inpatient Hospital Stay: Admitting: Hematology and Oncology

## 2024-04-08 ENCOUNTER — Telehealth: Payer: Self-pay | Admitting: Hematology and Oncology

## 2024-04-08 DIAGNOSIS — Z170 Estrogen receptor positive status [ER+]: Secondary | ICD-10-CM | POA: Diagnosis not present

## 2024-04-08 DIAGNOSIS — C189 Malignant neoplasm of colon, unspecified: Secondary | ICD-10-CM

## 2024-04-08 DIAGNOSIS — R911 Solitary pulmonary nodule: Secondary | ICD-10-CM

## 2024-04-08 DIAGNOSIS — C50512 Malignant neoplasm of lower-outer quadrant of left female breast: Secondary | ICD-10-CM | POA: Diagnosis not present

## 2024-04-08 NOTE — Telephone Encounter (Signed)
 spoke w/ patient regarding 12/23 los to schedule 1 year lab & fu

## 2024-04-08 NOTE — Assessment & Plan Note (Signed)
 Left breast cancer invasive ductal carcinoma: 0.5 cm with 9 mm area of DCIS status post left mastectomy 01/04/2012 ER/PR positive HER-2 negative Ki-67 17% currently on Arimidex  1 mg daily  Since 02/07/2012.  Completed 7 years of October 2020  (cecal adenocarcinoma diagnosed 04/22/2013, preop CEA of 5.1, status post right hemicolectomy 04/28/2014, grade 2, no MVI, 0/25 lymph nodes, subserosal involvement, T3 N0 M0 stage II A, no mismatch repair)   Contralateral breast cancer: 05/19/2021: Right breast mammogram and ultrasound: 1.6 cm mass in the right breast 12 o'clock position with architectural distortion highly suspicious for malignancy, no axillary lymph nodes, biopsy: Grade 3 IDC ER 0%, PR 0%, HER2 negative 1+ by IHC, Ki-67 20%    06/23/2021:Right mastectomy: Grade 3 IDC with DCIS, 3.4 cm, margins negative, 2/3 lymph nodes positive ER 0%, PR 0%, HER2 negative, Ki-67 20%    CT CAP and bone scan: 06/08/2021: Multiple small lung nodules stable with the exception of new 4 mm left lower lobe lung nodule and a 6 mm right lower lobe lung nodule open (nonspecific)   Treatment plan: Adjuvant chemotherapy with CMF x6 cycles completed 11/15/2021 Adjuvant radiation 12/09/2021-01/12/2022 ------------------------------------------------------------------------------------------------------- Breast cancer surveillance: Since she had bilateral mastectomies there is no role of mammograms. 10/15/2022: Breast exam: Benign   Lung nodules: CT chest 10/01/2022: Stable 5 mm bilateral lung nodules, ascending thoracic aneurysm 4.3 cm (stable since 2010) History of colon cancer: CEA: Normal  04/02/2024: CT CAP: No evidence of metastatic disease, stable lung nodules   I will see the patient once a year with CT chest performed ahead of time.

## 2024-04-08 NOTE — Progress Notes (Signed)
 HEMATOLOGY-ONCOLOGY TELEPHONE VISIT PROGRESS NOTE  I connected with our patient on 04/08/2024 at 10:30 AM EST by telephone and verified that I am speaking with the correct person using two identifiers.  I discussed the limitations, risks, security and privacy concerns of performing an evaluation and management service by telephone and the availability of in person appointments.  I also discussed with the patient that there may be a patient responsible charge related to this service. The patient expressed understanding and agreed to proceed.   History of Present Illness: Follow-up to review the results of CT scans  History of Present Illness Ann Maxwell is an 83 year old female with a history of bilateral breast invasive ductal carcinoma and colon adenocarcinoma who presents for routine oncology follow-up and review of surveillance imaging.  She has no new symptoms and feels well. She denies weight loss, fatigue, fevers, or respiratory complaints.  Recent surveillance CT shows stable small pulmonary nodules, unchanged from prior imaging. She recalls prior concern for growth of one nodule that prompted 105-month repeat imaging. She remains asymptomatic.  She has an abdominal aortic aneurysm previously measured at 4.3 cm and is concerned about possible interval growth because the most recent CT did not report its size. She asks about activity restrictions related to her twice-weekly yoga and Pilates, including inversion stretches, and denies heavy lifting or straining.  She is undergoing surveillance for minimal residual disease with optional serial ctDNA ([employer]) testing every 6 months. She had thought this was a one-time test but is agreeable to continued testing for reassurance.  She notes stress related to her daughter's medical issues and her ongoing role in providing emotional support to family.     Oncology History  Primary cancer of lower outer quadrant of left female breast (HCC)   01/04/2012 Surgery   Left mastectomy: Invasive ductal carcinoma 0.5 cm with high-grade DCIS 9 cm, margins negative, 0/6 lymph nodes, ER 100%, PR 100%, Ki-67 17%, HER-2 negative ratio 1.16   02/07/2012 - 05/28/2016 Anti-estrogen oral therapy   Anastrozole  1 mg daily (stopped early due to Osteoporosis)   06/01/2021 Initial Diagnosis   Left breast cancer invasive ductal carcinoma: 0.5 cm with 9 mm area of DCIS status post left mastectomy 01/04/2012 ER/PR positive HER-2 negative Ki-67 17% currently on Arimidex  1 mg daily  Since 02/07/2012.  Completed 7 years of October 2020  (cecal adenocarcinoma diagnosed 04/22/2013, preop CEA of 5.1, status post right hemicolectomy 04/28/2014, grade 2, no MVI, 0/25 lymph nodes, subserosal involvement, T3 N0 M0 stage II A, no mismatch repair)   06/23/2021 Surgery   Right mastectomy: Grade 3 IDC with DCIS, 3.4 cm, margins negative, 2/3 lymph nodes positive ER 0%, PR 0%, HER2 negative, Ki-67 20%   Primary colon cancer (HCC)  03/20/2014 Initial Diagnosis   Invasive adenocarcinoma of [place]   04/28/2014 Surgery   Right hemicolectomy: Invasive adenocarcinoma with mucinous features moderately differentiated invading subserosal tissue, margins negative, 25 lymph nodes negative, T3 N0 M0 stage II a, no microsatellite instability   Malignant neoplasm of central portion of right breast in female, estrogen receptor negative (HCC)  06/01/2021 Initial Diagnosis   Left breast cancer invasive ductal carcinoma: 0.5 cm with 9 mm area of DCIS status post left mastectomy 01/04/2012 ER/PR positive HER-2 negative Ki-67 17% currently on Arimidex  1 mg daily  Since 02/07/2012.  Completed 7 years of October 2020  (cecal adenocarcinoma diagnosed 04/22/2013, preop CEA of 5.1, status post right hemicolectomy 04/28/2014, grade 2, no MVI, 0/25 lymph nodes, subserosal involvement,  T3 N0 M0 stage II A, no mismatch repair)   06/01/2021 Cancer Staging   Staging form: Breast, AJCC 8th Edition -  Clinical: Stage IB (cT1c, cN0, cM0, G3, ER-, PR-, HER2-) - Signed by Odean Potts, MD on 06/01/2021 Histologic grading system: 3 grade system   06/08/2021 Imaging   CT CAP and bone scan: 06/08/2021: Multiple small lung nodules stable with the exception of new 4 mm left lower lobe lung nodule and a 6 mm right lower lobe lung nodule open (nonspecific)   06/23/2021 Surgery   06/23/2021:Right mastectomy: Grade 3 IDC with DCIS, 3.4 cm, margins negative, 2/3 lymph nodes positive ER 0%, PR 0%, HER2 negative, Ki-67 20%   06/23/2021 Cancer Staging   Staging form: Breast, AJCC 8th Edition - Pathologic stage from 06/23/2021: Stage IIIA (pT2, pN1a, cM0, G3, ER-, PR-, HER2-) - Signed by Crawford Morna Pickle, NP on 11/15/2021 Stage prefix: Initial diagnosis Histologic grading system: 3 grade system   08/02/2021 - 11/15/2021 Adjuvant Chemotherapy   CMF every 21 days x 6 cycles   12/08/2021 - 01/12/2022 Radiation Therapy   Site Technique Total Dose (Gy) Dose per Fx (Gy) Completed Fx Beam Energies  Chest Wall, Right: CW_R_IMN 3D 50/50 2 25/25 10X  Chest Wall, Right: CW_R_PAB_SCV 3D 50/50 2 25/25 6X, 10X     Cancer of central portion of right breast (HCC)  06/23/2021 Initial Diagnosis   Cancer of central portion of right breast (HCC)   11/08/2021 Cancer Staging   Staging form: Breast, AJCC 8th Edition - Pathologic stage from 11/08/2021: Stage IIIA (pT2, pN1, cM0, G3, ER-, PR-, HER2-) - Signed by Izell Domino, MD on 11/08/2021 Stage prefix: Initial diagnosis Histologic grading system: 3 grade system   12/08/2021 - 01/12/2022 Radiation Therapy   Site Technique Total Dose (Gy) Dose per Fx (Gy) Completed Fx Beam Energies  Chest Wall, Right: CW_R_IMN 3D 50/50 2 25/25 10X  Chest Wall, Right: CW_R_PAB_SCV 3D 50/50 2 25/25 6X, 10X       REVIEW OF SYSTEMS:   Constitutional: Denies fevers, chills or abnormal weight loss All other systems were reviewed with the patient and are negative. Observations/Objective:      Assessment Plan:  Primary cancer of lower outer quadrant of left female breast (HCC) Left breast cancer invasive ductal carcinoma: 0.5 cm with 9 mm area of DCIS status post left mastectomy 01/04/2012 ER/PR positive HER-2 negative Ki-67 17% currently on Arimidex  1 mg daily  Since 02/07/2012.  Completed 7 years of October 2020  (cecal adenocarcinoma diagnosed 04/22/2013, preop CEA of 5.1, status post right hemicolectomy 04/28/2014, grade 2, no MVI, 0/25 lymph nodes, subserosal involvement, T3 N0 M0 stage II A, no mismatch repair)   Contralateral breast cancer: 05/19/2021: Right breast mammogram and ultrasound: 1.6 cm mass in the right breast 12 o'clock position with architectural distortion highly suspicious for malignancy, no axillary lymph nodes, biopsy: Grade 3 IDC ER 0%, PR 0%, HER2 negative 1+ by IHC, Ki-67 20%    06/23/2021:Right mastectomy: Grade 3 IDC with DCIS, 3.4 cm, margins negative, 2/3 lymph nodes positive ER 0%, PR 0%, HER2 negative, Ki-67 20%    CT CAP and bone scan: 06/08/2021: Multiple small lung nodules stable with the exception of new 4 mm left lower lobe lung nodule and a 6 mm right lower lobe lung nodule open (nonspecific)   Treatment plan: Adjuvant chemotherapy with CMF x6 cycles completed 11/15/2021 Adjuvant radiation 12/09/2021-01/12/2022 ------------------------------------------------------------------------------------------------------- Breast cancer surveillance: Since she had bilateral mastectomies there is no role of mammograms. 10/15/2022:  Breast exam: Benign   Lung nodules: CT chest 10/01/2022: Stable 5 mm bilateral lung nodules, ascending thoracic aneurysm 4.3 cm (stable since 2010) History of colon cancer: CEA: Normal  04/02/2024: CT CAP: No evidence of metastatic disease, stable lung nodules I sent a message to Dr. Malva with radiology to read the size of the aneurysm on the recent CT scan.   I will see the patient once a year with CT chest performed ahead of  time along with labs. Her daughter Izetta is also a patient of mine. --------------------------------- Assessment and Plan Assessment & Plan Primary cancer of lower outer quadrant of left breast - Continued annual CT chest imaging and CEA monitoring. - Continued annual clinical examination. - Scheduled follow-up call one week post-annual scan and labs.  Stable pulmonary nodules Multiple small nodules unchanged on imaging, no metastatic disease, asymptomatic. - Recommended annual CT scan for surveillance.  Abdominal aortic aneurysm Aneurysm previously 4.3 cm, current size unreported, asymptomatic, well-controlled blood pressure. Discussed rupture risks and safe activities. - Requested radiology addendum for current aneurysm size. - Advised avoidance of heavy lifting and straining. - Reassured yoga/Pilates stretching is safe. - Recommended avoidance of amusement park rides with twisting or force. - Reinforced importance of blood pressure control.      I discussed the assessment and treatment plan with the patient. The patient was provided an opportunity to ask questions and all were answered. The patient agreed with the plan and demonstrated an understanding of the instructions. The patient was advised to call back or seek an in-person evaluation if the symptoms worsen or if the condition fails to improve as anticipated.   I provided 20 minutes of non-face-to-face time during this encounter.  This includes time for charting and coordination of care   Naomi MARLA Chad, MD

## 2024-09-24 ENCOUNTER — Other Ambulatory Visit

## 2024-10-01 ENCOUNTER — Ambulatory Visit: Admitting: Hematology and Oncology

## 2025-03-31 ENCOUNTER — Inpatient Hospital Stay

## 2025-04-07 ENCOUNTER — Inpatient Hospital Stay: Admitting: Hematology and Oncology
